# Patient Record
Sex: Male | Born: 1950 | Race: Black or African American | Hispanic: No | Marital: Single | Smoking: Former smoker
Health system: Southern US, Community
[De-identification: ages and names within clinical notes are randomized; demographics above are authoritative.]

## PROBLEM LIST (undated history)

## (undated) DIAGNOSIS — I10 Essential (primary) hypertension: Secondary | ICD-10-CM

## (undated) DIAGNOSIS — I480 Paroxysmal atrial fibrillation: Secondary | ICD-10-CM

## (undated) DIAGNOSIS — G40909 Epilepsy, unspecified, not intractable, without status epilepticus: Secondary | ICD-10-CM

## (undated) DIAGNOSIS — N184 Chronic kidney disease, stage 4 (severe): Secondary | ICD-10-CM

## (undated) DIAGNOSIS — N186 End stage renal disease: Secondary | ICD-10-CM

## (undated) DIAGNOSIS — Z992 Dependence on renal dialysis: Secondary | ICD-10-CM

---

## 2006-02-27 ENCOUNTER — Emergency Department: Payer: Self-pay | Admitting: Unknown Physician Specialty

## 2006-08-11 ENCOUNTER — Emergency Department: Payer: Self-pay | Admitting: Emergency Medicine

## 2011-05-12 ENCOUNTER — Other Ambulatory Visit: Payer: Self-pay | Admitting: Internal Medicine

## 2011-08-07 ENCOUNTER — Emergency Department: Payer: Self-pay

## 2011-11-17 ENCOUNTER — Emergency Department: Payer: Self-pay | Admitting: Emergency Medicine

## 2021-03-10 ENCOUNTER — Emergency Department (HOSPITAL_COMMUNITY): Payer: Self-pay

## 2021-03-10 ENCOUNTER — Inpatient Hospital Stay (HOSPITAL_COMMUNITY)
Admission: EM | Admit: 2021-03-10 | Discharge: 2021-03-12 | DRG: 305 | Disposition: A | Discharge status: Home / Self Care | Payer: Self-pay | Attending: Internal Medicine | Admitting: Internal Medicine

## 2021-03-10 DIAGNOSIS — Z87891 Personal history of nicotine dependence: Secondary | ICD-10-CM

## 2021-03-10 DIAGNOSIS — Z66 Do not resuscitate: Secondary | ICD-10-CM | POA: Diagnosis present

## 2021-03-10 DIAGNOSIS — N400 Enlarged prostate without lower urinary tract symptoms: Secondary | ICD-10-CM | POA: Diagnosis present

## 2021-03-10 DIAGNOSIS — D631 Anemia in chronic kidney disease: Secondary | ICD-10-CM | POA: Diagnosis present

## 2021-03-10 DIAGNOSIS — E785 Hyperlipidemia, unspecified: Secondary | ICD-10-CM | POA: Diagnosis present

## 2021-03-10 DIAGNOSIS — R9431 Abnormal electrocardiogram [ECG] [EKG]: Secondary | ICD-10-CM

## 2021-03-10 DIAGNOSIS — N189 Chronic kidney disease, unspecified: Secondary | ICD-10-CM

## 2021-03-10 DIAGNOSIS — I10 Essential (primary) hypertension: Secondary | ICD-10-CM | POA: Diagnosis present

## 2021-03-10 DIAGNOSIS — I714 Abdominal aortic aneurysm, without rupture, unspecified: Secondary | ICD-10-CM

## 2021-03-10 DIAGNOSIS — E860 Dehydration: Secondary | ICD-10-CM | POA: Diagnosis present

## 2021-03-10 DIAGNOSIS — N179 Acute kidney failure, unspecified: Secondary | ICD-10-CM | POA: Diagnosis present

## 2021-03-10 DIAGNOSIS — N281 Cyst of kidney, acquired: Secondary | ICD-10-CM | POA: Diagnosis present

## 2021-03-10 DIAGNOSIS — N184 Chronic kidney disease, stage 4 (severe): Secondary | ICD-10-CM | POA: Diagnosis present

## 2021-03-10 DIAGNOSIS — Z79899 Other long term (current) drug therapy: Secondary | ICD-10-CM

## 2021-03-10 DIAGNOSIS — I723 Aneurysm of iliac artery: Secondary | ICD-10-CM

## 2021-03-10 DIAGNOSIS — Z7982 Long term (current) use of aspirin: Secondary | ICD-10-CM

## 2021-03-10 DIAGNOSIS — I129 Hypertensive chronic kidney disease with stage 1 through stage 4 chronic kidney disease, or unspecified chronic kidney disease: Secondary | ICD-10-CM | POA: Diagnosis present

## 2021-03-10 DIAGNOSIS — I160 Hypertensive urgency: Principal | ICD-10-CM

## 2021-03-10 DIAGNOSIS — Z20822 Contact with and (suspected) exposure to covid-19: Secondary | ICD-10-CM | POA: Diagnosis present

## 2021-03-10 DIAGNOSIS — Z8249 Family history of ischemic heart disease and other diseases of the circulatory system: Secondary | ICD-10-CM

## 2021-03-10 DIAGNOSIS — R7989 Other specified abnormal findings of blood chemistry: Secondary | ICD-10-CM

## 2021-03-10 DIAGNOSIS — R55 Syncope and collapse: Principal | ICD-10-CM

## 2021-03-10 HISTORY — DX: Chronic kidney disease, stage 4 (severe): N18.4

## 2021-03-10 HISTORY — DX: Essential (primary) hypertension: I10

## 2021-03-10 LAB — COMPREHENSIVE METABOLIC PANEL
ALT: 16 U/L (ref 0–44)
AST: 22 U/L (ref 15–41)
Albumin: 3.9 g/dL (ref 3.5–5.0)
Alkaline Phosphatase: 91 U/L (ref 38–126)
Anion gap: 10 (ref 5–15)
BUN: 47 mg/dL — ABNORMAL HIGH (ref 8–23)
CO2: 24 mmol/L (ref 22–32)
Calcium: 10 mg/dL (ref 8.9–10.3)
Chloride: 102 mmol/L (ref 98–111)
Creatinine, Ser: 5.47 mg/dL — ABNORMAL HIGH (ref 0.61–1.24)
GFR, Estimated: 11 mL/min — ABNORMAL LOW (ref >60)
Glucose, Bld: 140 mg/dL — ABNORMAL HIGH (ref 70–99)
Potassium: 4.9 mmol/L (ref 3.5–5.1)
Sodium: 136 mmol/L (ref 135–145)
Total Bilirubin: 0.6 mg/dL (ref 0.3–1.2)
Total Protein: 7.6 g/dL (ref 6.5–8.1)

## 2021-03-10 LAB — CBC WITH DIFFERENTIAL/PLATELET
Abs Immature Granulocytes: 0.02 10*3/uL (ref 0.00–0.07)
Basophils Absolute: 0 10*3/uL (ref 0.0–0.1)
Basophils Relative: 1 %
Eosinophils Absolute: 0 10*3/uL (ref 0.0–0.5)
Eosinophils Relative: 1 %
HCT: 35.6 % — ABNORMAL LOW (ref 39.0–52.0)
Hemoglobin: 11.3 g/dL — ABNORMAL LOW (ref 13.0–17.0)
Immature Granulocytes: 0 %
Lymphocytes Relative: 25 %
Lymphs Abs: 1.6 10*3/uL (ref 0.7–4.0)
MCH: 28.4 pg (ref 26.0–34.0)
MCHC: 31.7 g/dL (ref 30.0–36.0)
MCV: 89.4 fL (ref 80.0–100.0)
Monocytes Absolute: 0.4 10*3/uL (ref 0.1–1.0)
Monocytes Relative: 6 %
Neutro Abs: 4.3 10*3/uL (ref 1.7–7.7)
Neutrophils Relative %: 67 %
Platelets: 298 10*3/uL (ref 150–400)
RBC: 3.98 MIL/uL — ABNORMAL LOW (ref 4.22–5.81)
RDW: 15.3 % (ref 11.5–15.5)
WBC: 6.4 10*3/uL (ref 4.0–10.5)
nRBC: 0 % (ref 0.0–0.2)

## 2021-03-10 LAB — ETHANOL: Alcohol, Ethyl (B): <10 mg/dL (ref <10)

## 2021-03-10 NOTE — ED Triage Notes (Signed)
Pt arrives via EMS from home with reports of near syncopal event. Pt was smoking weed and drinking today. EKG unremarkable for EMS. CBG 192. Denies CP or SOB.

## 2021-03-10 NOTE — ED Provider Notes (Signed)
Emergency Medicine Provider Triage Evaluation Note  Tanner Rollins , a 70 y.o. male  was evaluated in triage.  Pt complains of syncopal episode that occurred prior to arrival.  Patient states he was at a friend's house and was drinking wine and smoking marijuana.  While sitting in a chair he began feeling lightheaded and briefly lost consciousness.  His friend lowered him to the floor.  Denies any falls or head trauma.  States that he currently feels fatigued but otherwise is having no complaints.  Denies any chest pain or shortness of breath before or after the syncopal episode.  No current chest pain or shortness of breath.  No abdominal pain.  No numbness or weakness.  Physical Exam  BP (!) 158/105 (BP Location: Right Arm)   Pulse 61   Temp (!) 97.5 F (36.4 C) (Oral)   Resp 14   SpO2 98%  Gen:   Awake, no distress   Resp:  Normal effort  MSK:   Moves extremities without difficulty  Other:  Strength is 5/5 in all 4 extremities. RRR without M/R/G.  No tongue bites.  Medical Decision Making  Medically screening exam initiated at 4:52 PM.  Appropriate orders placed.  Brandy Milum was informed that the remainder of the evaluation will be completed by another provider, this initial triage assessment does not replace that evaluation, and the importance of remaining in the ED until their evaluation is complete.   Rayna Sexton, PA-C 03/10/21 1655    Luna Fuse, MD 03/11/21 1109

## 2021-03-11 ENCOUNTER — Encounter (HOSPITAL_COMMUNITY): Payer: Self-pay | Admitting: Internal Medicine

## 2021-03-11 ENCOUNTER — Other Ambulatory Visit: Payer: Self-pay

## 2021-03-11 ENCOUNTER — Emergency Department (HOSPITAL_COMMUNITY): Payer: Self-pay

## 2021-03-11 DIAGNOSIS — I10 Essential (primary) hypertension: Secondary | ICD-10-CM | POA: Diagnosis present

## 2021-03-11 LAB — RESP PANEL BY RT-PCR (FLU A&B, COVID) ARPGX2
Influenza A by PCR: NEGATIVE
Influenza B by PCR: NEGATIVE
SARS Coronavirus 2 by RT PCR: NEGATIVE

## 2021-03-11 LAB — LIPID PANEL
Cholesterol: 191 mg/dL (ref 0–200)
HDL: 45 mg/dL (ref >40)
LDL Cholesterol: 125 mg/dL — ABNORMAL HIGH (ref 0–99)
Total CHOL/HDL Ratio: 4.2 RATIO
Triglycerides: 103 mg/dL (ref <150)
VLDL: 21 mg/dL (ref 0–40)

## 2021-03-11 LAB — URINALYSIS, ROUTINE W REFLEX MICROSCOPIC
Bacteria, UA: NONE SEEN
Bilirubin Urine: NEGATIVE
Glucose, UA: 50 mg/dL — AB
Hgb urine dipstick: NEGATIVE
Ketones, ur: NEGATIVE mg/dL
Leukocytes,Ua: NEGATIVE
Nitrite: NEGATIVE
Protein, ur: >=300 mg/dL — AB
Specific Gravity, Urine: 1.011 (ref 1.005–1.030)
pH: 6 (ref 5.0–8.0)

## 2021-03-11 LAB — IRON AND TIBC
Iron: 69 ug/dL (ref 45–182)
Saturation Ratios: 25 % (ref 17.9–39.5)
TIBC: 272 ug/dL (ref 250–450)
UIBC: 203 ug/dL

## 2021-03-11 LAB — FERRITIN: Ferritin: 304 ng/mL (ref 24–336)

## 2021-03-11 LAB — TSH: TSH: 1.777 u[IU]/mL (ref 0.350–4.500)

## 2021-03-11 LAB — SODIUM, URINE, RANDOM: Sodium, Ur: 116 mmol/L

## 2021-03-11 MED ORDER — CARVEDILOL 3.125 MG PO TABS: 6.2500 mg | ORAL_TABLET | Freq: Two times a day (BID) | ORAL | Status: DC

## 2021-03-11 MED ORDER — ONDANSETRON HCL 4 MG PO TABS: 4.0000 mg | ORAL_TABLET | Freq: Four times a day (QID) | ORAL | Status: DC | PRN

## 2021-03-11 MED ORDER — ASPIRIN EC 81 MG PO TBEC
81.0000 mg | DELAYED_RELEASE_TABLET | Freq: Every day | ORAL | Status: DC
  Administered 2021-03-11 – 2021-03-12 (×2): 81 mg via ORAL
  Filled 2021-03-11 (×2): qty 1

## 2021-03-11 MED ORDER — ACETAMINOPHEN 325 MG PO TABS: 650.0000 mg | ORAL_TABLET | Freq: Four times a day (QID) | ORAL | Status: DC | PRN

## 2021-03-11 MED ORDER — SODIUM CHLORIDE 0.9 % IV BOLUS
1000.0000 mL | Freq: Once | INTRAVENOUS | Status: AC
  Administered 2021-03-11: 1000 mL via INTRAVENOUS

## 2021-03-11 MED ORDER — CARVEDILOL 6.25 MG PO TABS
6.2500 mg | ORAL_TABLET | Freq: Two times a day (BID) | ORAL | Status: DC
  Administered 2021-03-11 – 2021-03-12 (×4): 6.25 mg via ORAL
  Filled 2021-03-11 (×2): qty 2
  Filled 2021-03-11 (×2): qty 1

## 2021-03-11 MED ORDER — SODIUM CHLORIDE 0.9 % IV SOLN: INTRAVENOUS | Status: AC

## 2021-03-11 MED ORDER — ONDANSETRON HCL 4 MG/2ML IJ SOLN: 4.0000 mg | Freq: Four times a day (QID) | INTRAMUSCULAR | Status: DC | PRN

## 2021-03-11 MED ORDER — AMLODIPINE BESYLATE 10 MG PO TABS
10.0000 mg | ORAL_TABLET | Freq: Every day | ORAL | Status: DC
  Administered 2021-03-11 – 2021-03-12 (×2): 10 mg via ORAL
  Filled 2021-03-11: qty 1
  Filled 2021-03-11: qty 2
  Filled 2021-03-11: qty 1

## 2021-03-11 MED ORDER — LACTATED RINGERS IV SOLN: INTRAVENOUS | Status: DC

## 2021-03-11 MED ORDER — ACETAMINOPHEN 650 MG RE SUPP: 650.0000 mg | Freq: Four times a day (QID) | RECTAL | Status: DC | PRN

## 2021-03-11 MED ORDER — HEPARIN SODIUM (PORCINE) 5000 UNIT/ML IJ SOLN
5000.0000 [IU] | Freq: Three times a day (TID) | INTRAMUSCULAR | Status: DC
  Administered 2021-03-11 – 2021-03-12 (×4): 5000 [IU] via SUBCUTANEOUS
  Filled 2021-03-11 (×4): qty 1

## 2021-03-11 MED ORDER — SENNOSIDES-DOCUSATE SODIUM 8.6-50 MG PO TABS: 1.0000 | ORAL_TABLET | Freq: Every evening | ORAL | Status: DC | PRN

## 2021-03-11 MED ORDER — LABETALOL HCL 5 MG/ML IV SOLN: 5.0000 mg | INTRAVENOUS | Status: DC | PRN

## 2021-03-11 NOTE — ED Provider Notes (Signed)
[facility] EMERGENCY DEPARTMENT Provider Note   CSN: [id number] Arrival date & time: 03/10/21  1647     History No chief complaint on file.   Tanner Rollins is a 70 y.o. male.  Patient with history of hypertension, followed at [facility], presents to the emergency department for syncopal episode.  Patient states that he was with his girlfriend helping her pack when he was smoking a joint.  He states that he had a few second prodrome of lightheadedness, turned his head and then fell to the ground.  He is not certain as to whether he blacked out completely but states that he could not get up for about 30 minutes.  He has never had anything like this happen to him before.  He denies associated chest pain or shortness of breath.  No headache.  No recent nausea, vomiting, or diarrhea.  Patient states that he is on a blood pressure medication that is 10 mg.  He states that he had not gone to the [place] for several years but then recently returned.  He states that he went because he felt his blood pressure was less controlled.  He also reports having significant weight loss, unable to quantify, since about Christmas.  He had lab work drawn and stated that they told him he needed to follow-up with a kidney doctor.  He does not yet have an appointment.  No medication adjustments that he relates.  Patient denies signs of stroke including: facial droop, slurred speech, aphasia, weakness/numbness in extremities, imbalance/trouble walking.       No past medical history on file.  There are no problems to display for this patient.   The histories are not reviewed yet. Please review them in the "History" navigator section and refresh this [place].     No family history on file.     Home Medications Prior to Admission medications   Not on File    Allergies    Patient has no allergy information on record.  Review of Systems   Review of Systems  Constitutional:  Positive for  unexpected weight change. Negative for fever.  HENT:  Negative for rhinorrhea and sore throat.   Eyes:  Negative for redness.  Respiratory:  Negative for cough and shortness of breath.   Cardiovascular:  Negative for chest pain.  Gastrointestinal:  Negative for abdominal pain, diarrhea, nausea and vomiting.  Genitourinary:  Negative for dysuria and hematuria.  Musculoskeletal:  Negative for myalgias.  Skin:  Negative for rash.  Neurological:  Positive for syncope. Negative for headaches.   Physical Exam Updated Vital Signs BP (!) 155/99   Pulse 88   Temp 98.2 F (36.8 C)   Resp 16   SpO2 98%   Physical Exam Vitals and nursing note reviewed.  Constitutional:      General: He is not in acute distress.    Appearance: He is well-developed.  HENT:     Head: Normocephalic and atraumatic.     Right Ear: External ear normal.     Left Ear: External ear normal.  Eyes:     General:        Right eye: No discharge.        Left eye: No discharge.     Conjunctiva/sclera: Conjunctivae normal.  Cardiovascular:     Rate and Rhythm: Normal rate and regular rhythm.     Heart sounds: Normal heart sounds.  Pulmonary:     Effort: Pulmonary effort is normal.  Breath sounds: Normal breath sounds.  Abdominal:     Palpations: Abdomen is soft.     Tenderness: There is no abdominal tenderness. There is no guarding or rebound.  Musculoskeletal:     Cervical back: Normal range of motion and neck supple.     Right lower leg: No edema.     Left lower leg: No edema.  Skin:    General: Skin is warm and dry.  Neurological:     Mental Status: He is alert.    ED Results / Procedures / Treatments   Labs (all labs ordered are listed, but only abnormal results are displayed) Labs Reviewed  COMPREHENSIVE METABOLIC PANEL - Abnormal; Notable for the following components:      Result Value   Glucose, Bld 140 (*)    BUN 47 (*)    Creatinine, Ser 5.47 (*)    GFR, Estimated 11 (*)    All other  components within normal limits  CBC WITH DIFFERENTIAL/PLATELET - Abnormal; Notable for the following components:   RBC 3.98 (*)    Hemoglobin 11.3 (*)    HCT 35.6 (*)    All other components within normal limits  URINALYSIS, ROUTINE W REFLEX MICROSCOPIC - Abnormal; Notable for the following components:   Glucose, UA 50 (*)    Protein, ur >=300 (*)    All other components within normal limits  ETHANOL    ED ECG REPORT   Date: 03/11/2021  Rate: 60  Rhythm: normal sinus rhythm  QRS Axis: normal  Intervals: normal  ST/T Wave abnormalities: nonspecific ST/T changes  Conduction Disutrbances:none  Narrative Interpretation:   Old EKG Reviewed: none available  I have personally reviewed the EKG tracing and agree with the computerized printout as noted.    Radiology CT Head Wo Contrast  Result Date: 03/10/2021 CLINICAL DATA:  70 year old male with syncope. EXAM: CT HEAD WITHOUT CONTRAST TECHNIQUE: Contiguous axial images were obtained from the base of the skull through the vertex without intravenous contrast. COMPARISON:  None. FINDINGS: Brain: There is mild age-related atrophy and chronic microvascular ischemic changes. 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 There is no acute intracranial hemorrhage. No mass effect midline shift. No extra-axial fluid collection. Vascular: No hyperdense vessel or unexpected calcification. Skull: Normal. Negative for fracture or focal lesion. Sinuses/Orbits: No acute finding. Other: None IMPRESSION: 1. No acute intracranial pathology. 2. Mild age-related atrophy and chronic microvascular ischemic changes. 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 Electronically Signed   By: Anner Crete M.D.   On: 03/10/2021 17:37    Procedures Procedures   Medications Ordered in ED Medications - No data to display  ED Course  I have reviewed the triage vital signs and  the nursing notes.  Pertinent labs & imaging results that were available during my care of the patient were reviewed by me and considered in my medical decision making (see chart for details).  Patient seen and examined after extended wait time.   Will likely need admit. Will obtain CT scan due to weight loss, AKI.   Will advise admission for AKI vs CKD, syncope with abnormal EKG, syncope with abnormal CT. Discussed with Dr. Laverta Baltimore.  Vital signs reviewed and are as follows: BP (!) 155/99   Pulse 88   Temp 98.2 F (36.8 C)   Resp 16   SpO2 98%   CT with chronic findings as above.   Reviewed  results with patient at bedside.  Given multiple problems, he agrees to admission for further evaluation.  Discussed with internal medicine teaching service will evaluate for admission.     MDM Rules/Calculators/A&P                           Admit.    Final Clinical Impression(s) / ED Diagnoses Final diagnoses:  Syncope, unspecified syncope type  Elevated serum creatinine  Abnormal electrocardiogram (ECG) (EKG)  Abdominal aortic aneurysm (AAA) without rupture (HCC)  Iliac artery aneurysm [facility])  Hypertensive urgency    Rx / DC Orders ED Discharge Orders     None        Carlisle Cater, PA-C 03/11/21 [id number]    Margette Fast, MD 03/13/21 [phone]

## 2021-03-11 NOTE — H&P (Signed)
Date: 03/11/2021               Patient Name:  Tanner Rollins MRN: [id number]  DOB: 07/17/1951 Age / Sex: 70 y.o., male   PCP: Pcp, No         Medical Service: Internal Medicine Teaching Service         Attending Physician: Dr. Lucious Groves, DO    First Contact: Dr. Linwood Dibbles, MD Pager: [phone]  Second Contact: Dr. Harvie Heck Pager: [phone]       After Hours (After 5p/  First Contact Pager: [phone]  weekends / holidays): Second Contact Pager: [phone]   Chief Complaint: Pre-syncope  History of Present Illness: Mr. Dupler is a 70 year old gentleman with a history of HTN, CKD stage IV, and HLD who follows with the [facility] presenting via EMS after a pre-syncope episode. Patient reports he was helping his girlfriend move yesterday afternoon when he felt lightheaded and dropped to the floor. His girlfriend was able to catch him before he was fallen. He did not hit his head and did not have any prodromal symptoms. He denies blacking out but states he felt weak and was not able to get up for about 30 minutes before EMS arrival. He reports smoking marijuana and drinking some wine prior to the episode. States he believes he felt dizzy because he had not had anything to eat or drink that day.  He denies any chest pain, shortness of breath, blurry vision, headaches, abdominal pain, nausea/vomiting, fever/chills, palpitations, LE edema, orthopnea. He reports loss of appetite, decreased energy and weight loss since December 2021 but states his appetite is coming back slowly.  Of note, patient has been seen multiple providers since 2016 and his creatinine was 1.88. He was seen at the ED of the [facility] on 7/14 for severe BP with SBP in the 200s to 220s. He was found to have a creatinine of 4.4, GFR 14, UA showed proteinuria. Patient was started on amlodipine 10 mg daily. He was given information to establish with new PCP and nephrologist.  He has been taking his amlodipine but has yet  to establish with a PCP.  Meds:  Current Meds  Medication Sig   amLODipine (NORVASC) 10 MG tablet Take 1 tablet by mouth daily.   aspirin 325 MG tablet Take 325 mg by mouth daily.     Allergies: Allergies as of 03/10/2021   (Not on File)   Past Medical History:  Diagnosis Date   CKD (chronic kidney disease) stage 4, GFR 15-29 ml/min (HCC)    Hypertension     Family History: History of hypertension in mother, father, brothers and sister.history of kidney disease in mother. History of arterial aneurysm in sister.  Social History: Served in the [employer] for 7 years. Currently lives in [place]. Has not worked since last year. Quit smoking tobacco 25 years ago after smoking half a pack for 20 years. Occasional alcohol use. Smokes occasional marijuana.  Review of Systems: A complete ROS was negative except as per HPI.   Physical Exam: Blood pressure (!) 192/120, pulse 73, temperature 98.2 F (36.8 C), resp. rate 15, SpO2 100 %.  General: Pleasant, cachectic appearing elderly man laying in bed. No acute distress. HEENT: Dry mucous membrane.  Head: Normocephalic. Atraumatic. CV: RRR. No murmurs, rubs, or gallops. No LE edema Pulmonary: Lungs CTAB. Normal effort. No wheezing or rales. Abdominal: Soft, nontender, nondistended. Normal bowel sounds. Extremities: Palpable pulses. Normal ROM. Skin: Warm and  dry. Hypopigmented areas on back Neuro: A&Ox3. Moves all extremities. Normal sensation. No focal deficit. Psych: Normal mood and affect  EKG: personally reviewed my interpretation is normal sinus rhythm rate of 60, LVH and possible LAE.  CT head: No acute intracranial abnormalities. Evidence of old-appearing infarct in the right basal ganglia and right parietal cortex.   CT abdomen pelvis: No acute findings. A 5.4 cm right renal cyst. Heterogenous mineralization in the sacrum and iliac bone possible paget disease.    Assessment & Plan by Problem: Active Problems:   Severe  hypertension  Mr. Amaya is a 70 year old gentleman with a history of HTN, CKD stage IV, and HLD presented after a pre-syncope episode and found to have severe hypertension and AKI on CKD4.   #Severe hypertension Patient with a history of uncontrolled hypertension who did not come in contact with the medical system for 6 years. Recent evaluation at [facility] ED on 7/14 for elevated BP with SBP in the 200s to 230s. Presented with SBP in the 150s to 200s. Continue to deny any headaches, blurry vision, chest pain, shortness of breath. No signs of end-organ damage. Patient hemodynamically stable with HR 60s to 90s. We will add another antihypertensive and slowly decrease BP --Resume home amlodipine 10 mg --Start Coreg 6.25 mg twice daily --Daily vital --PRN labetalol for SBP > 200  #Presyncope Patient reports feeling lightheaded while moving some boxes yesterday afternoon. Felt weak and fell down but did not pass out. Reports smoking weed and drinking wine but had no liquid or food prior to episode. CT head with no acute abnormalities. EKG unremarkable. Ethanol wnl. Labs significant for AKI on CKD likely secondary to dehydration. --S/p 1 L IVNS bolus --IVNS @ 100 mL/hr x5 hr  #AKI on CKD 4 Patient follows with the [facility]. Creatinine in 2016 was 1.88. Patient had not seen a provider since 2016. He was recently seen in the [facility] ED as above and found to have a creatinine elevation to 4.4, GFR 14.  Found to have BUN/creatinine ratio of 47/5.47 on arrival. Likely secondary to progression of his renal disease versus AKI on CKD in the setting of decreased p.o. intake. UA positive for proteinuria. -- MA/CR to quantify proteinuria -- A1c to check for diabetes -- Follow-up urine sodium -- Strict I&O's, daily weights -- Trend BMP, electrolytes -- Avoid nephrotoxic agents -- Renal diet  #Normocytic anemia Patient found to have a hemoglobin of 11.3 on admission. Reported loss of appetite, decreased  energy and weight loss the last few months. No history of colonoscopy.  Likely 2/2 anemia of chronic disease in the setting of CKD but will rule out iron deficiency anemia. -- Follow-up iron studies -- Daily CBC -- Needs GI referral for colonoscopy in the outpatient  #Hyperlipidemia [facility].  -- Continue home ASA 81 mg daily -- Follow-up lipid panel  CODE STATUS: DNR DIET: Renal  PPx: Heparin  Dispo: Admit patient to Observation with expected length of stay less than 2 midnights.  Signed: Lacinda Axon, MD 03/11/2021, 9:40 AM  Pager: [phone] Internal Medicine Teaching Service After 5pm on weekdays and 1pm on weekends: On Call pager: [phone]

## 2021-03-12 ENCOUNTER — Inpatient Hospital Stay (HOSPITAL_COMMUNITY): Payer: Self-pay

## 2021-03-12 DIAGNOSIS — N184 Chronic kidney disease, stage 4 (severe): Secondary | ICD-10-CM | POA: Diagnosis present

## 2021-03-12 DIAGNOSIS — D631 Anemia in chronic kidney disease: Secondary | ICD-10-CM | POA: Diagnosis present

## 2021-03-12 DIAGNOSIS — I10 Essential (primary) hypertension: Secondary | ICD-10-CM

## 2021-03-12 DIAGNOSIS — N400 Enlarged prostate without lower urinary tract symptoms: Secondary | ICD-10-CM | POA: Diagnosis present

## 2021-03-12 DIAGNOSIS — N189 Chronic kidney disease, unspecified: Secondary | ICD-10-CM | POA: Diagnosis present

## 2021-03-12 LAB — RENAL FUNCTION PANEL
Albumin: 2.9 g/dL — ABNORMAL LOW (ref 3.5–5.0)
Anion gap: 7 (ref 5–15)
BUN: 36 mg/dL — ABNORMAL HIGH (ref 8–23)
CO2: 25 mmol/L (ref 22–32)
Calcium: 9.5 mg/dL (ref 8.9–10.3)
Chloride: 106 mmol/L (ref 98–111)
Creatinine, Ser: 4.75 mg/dL — ABNORMAL HIGH (ref 0.61–1.24)
GFR, Estimated: 13 mL/min — ABNORMAL LOW (ref >60)
Glucose, Bld: 91 mg/dL (ref 70–99)
Phosphorus: 3.8 mg/dL (ref 2.5–4.6)
Potassium: 4.6 mmol/L (ref 3.5–5.1)
Sodium: 138 mmol/L (ref 135–145)

## 2021-03-12 LAB — CBC
HCT: 31.3 % — ABNORMAL LOW (ref 39.0–52.0)
Hemoglobin: 10.1 g/dL — ABNORMAL LOW (ref 13.0–17.0)
MCH: 28.6 pg (ref 26.0–34.0)
MCHC: 32.3 g/dL (ref 30.0–36.0)
MCV: 88.7 fL (ref 80.0–100.0)
Platelets: 267 10*3/uL (ref 150–400)
RBC: 3.53 MIL/uL — ABNORMAL LOW (ref 4.22–5.81)
RDW: 15.6 % — ABNORMAL HIGH (ref 11.5–15.5)
WBC: 5.4 10*3/uL (ref 4.0–10.5)
nRBC: 0 % (ref 0.0–0.2)

## 2021-03-12 LAB — HEMOGLOBIN A1C
Hgb A1c MFr Bld: 5.5 % (ref 4.8–5.6)
Mean Plasma Glucose: 111 mg/dL

## 2021-03-12 LAB — HIV ANTIBODY (ROUTINE TESTING W REFLEX): HIV Screen 4th Generation wRfx: NONREACTIVE

## 2021-03-12 MED ORDER — ASPIRIN 81 MG PO TBEC: 81.0000 mg | DELAYED_RELEASE_TABLET | Freq: Every day | ORAL | 11 refills | Status: DC

## 2021-03-12 MED ORDER — ATORVASTATIN CALCIUM 40 MG PO TABS
40.0000 mg | ORAL_TABLET | Freq: Every day | ORAL | Status: DC
  Administered 2021-03-12: 40 mg via ORAL
  Filled 2021-03-12: qty 1

## 2021-03-12 MED ORDER — CHLORTHALIDONE 25 MG PO TABS: 12.5000 mg | ORAL_TABLET | Freq: Every day | ORAL | 0 refills | Status: DC

## 2021-03-12 MED ORDER — ATORVASTATIN CALCIUM 40 MG PO TABS: 40.0000 mg | ORAL_TABLET | Freq: Every day | ORAL | 0 refills | Status: DC

## 2021-03-12 NOTE — Progress Notes (Signed)
Patient has ordered for discharge. Given discharge instructions with paper to the patient. Iv removed. Given all belongings to the patient. 

## 2021-03-12 NOTE — Progress Notes (Deleted)
HD#1 SUBJECTIVE:  Patient Summary: Tanner Rollins is a 70 y.o. with a pertinent PMH of CKD IV, HTN, and HLD, who presented with presyncope and admitted for severe hypertension.   Overnight Events: No acute events overnight reported.  Interim History: Tanner Rollins was seen and evaluated at the bedside this morning. He states that he feels much improved compared to yesterday. Patient does not feel dizzy or lightheaded anymore. He continues to deny any cp, sob, blurry vision, headaches, or any other sxs at this time.   OBJECTIVE:  Vital Signs: Vitals:   03/12/21 0020 03/12/21 0412 03/12/21 0732 03/12/21 1222  BP: (!) 155/91 (!) 156/91  (!) 156/84  Pulse: (!) 54 (!) 51 61 (!) 55  Resp: '17 18  16  '$ Temp: 98.3 F (36.8 C) 98.2 F (36.8 C)  98.5 F (36.9 C)  TempSrc: Oral Oral  Oral  SpO2: 100% 100%  99%  Weight:  64.1 kg    Height:       Supplemental O2: Room Air SpO2: 99 %  Filed Weights   03/11/21 1150 03/12/21 0412  Weight: 68 kg 64.1 kg     Intake/Output Summary (Last 24 hours) at 03/12/2021 1412 Last data filed at 03/12/2021 0418 Gross per 24 hour  Intake 1368.86 ml  Output --  Net 1368.86 ml   Net IO Since Admission: 1,868.86 mL [03/12/21 1412]  Physical Exam: Physical Exam Constitutional:      General: He is not in acute distress.    Appearance: Normal appearance. He is not ill-appearing.  HENT:     Head: Normocephalic and atraumatic.  Eyes:     Pupils: Pupils are equal, round, and reactive to light.  Cardiovascular:     Rate and Rhythm: Normal rate and regular rhythm.     Pulses: Normal pulses.     Heart sounds: Normal heart sounds. No murmur heard. Pulmonary:     Effort: Pulmonary effort is normal. No respiratory distress.     Breath sounds: Normal breath sounds. No wheezing, rhonchi or rales.  Abdominal:     General: Bowel sounds are normal. There is no distension.     Palpations: Abdomen is soft.     Tenderness: There is no abdominal  tenderness.  Musculoskeletal:        General: Normal range of motion.     Right lower leg: No edema.     Left lower leg: No edema.  Skin:    General: Skin is warm and dry.  Neurological:     General: No focal deficit present.     Mental Status: He is alert and oriented to person, place, and time. Mental status is at baseline.    Patient Lines/Drains/Airways Status     Active Line/Drains/Airways     Name Placement date Placement time Site Days   Peripheral IV 03/11/21 20 G Anterior;Left Forearm 03/11/21  0700  Forearm  1            Pertinent Labs: CBC Latest Ref Rng & Units 03/12/2021 03/10/2021  WBC 4.0 - 10.5 K/uL 5.4 6.4  Hemoglobin 13.0 - 17.0 g/dL 10.1(L) 11.3(L)  Hematocrit 39.0 - 52.0 % 31.3(L) 35.6(L)  Platelets 150 - 400 K/uL 267 298    CMP Latest Ref Rng & Units 03/12/2021 03/10/2021  Glucose 70 - 99 mg/dL 91 140(H)  BUN 8 - 23 mg/dL 36(H) 47(H)  Creatinine 0.61 - 1.24 mg/dL 4.75(H) 5.47(H)  Sodium 135 - 145 mmol/L 138 136  Potassium 3.5 - 5.1  mmol/L 4.6 4.9  Chloride 98 - 111 mmol/L 106 102  CO2 22 - 32 mmol/L 25 24  Calcium 8.9 - 10.3 mg/dL 9.5 10.0  Total Protein 6.5 - 8.1 g/dL - 7.6  Total Bilirubin 0.3 - 1.2 mg/dL - 0.6  Alkaline Phos 38 - 126 U/L - 91  AST 15 - 41 U/L - 22  ALT 0 - 44 U/L - 16    No results for input(s): GLUCAP in the last 72 hours.   Pertinent Imaging: US RENAL  Result Date: 03/12/2021 CLINICAL DATA:  Chronic kidney disease EXAM: RENAL / URINARY TRACT ULTRASOUND COMPLETE COMPARISON:  03/11/2021 FINDINGS: Right Kidney: Renal measurements: 8.8 x 4.8 x 4.0 cm = volume: 88 mL. Increased echogenicity compatible with medical renal disease. Right upper pole renal cyst measures 3.3 x 3.4 x 2.8 cm. No renal obstruction or hydronephrosis. Left Kidney: Renal measurements: 7.8 x 4.5 x 4.7 cm = volume: 75 mL. Increased echogenicity compatible with medical renal disease. No hydronephrosis. Limited visualization of the lower pole because of  obscuring bowel gas. Bladder: Appears normal for degree of bladder distention. Other: Enlarged prostate proximally measuring 4.7 x 3.4 x 4.7 cm IMPRESSION: Increased renal echogenicity bilaterally compatible with medical renal disease. 3.4 cm right upper pole renal cyst. No acute finding or hydronephrosis Prostate enlargement Electronically Signed   By: Jerilynn Mages.  Shick M.D.   On: 03/12/2021 12:16    ASSESSMENT/PLAN:  Assessment: Active Problems:   Severe hypertension  Plan:  #Severe hypertension Patient with a history of uncontrolled hypertension who did not come in contact with the medical system for 6 years. He recently established at the [facility] ED on 7/14 for elevated BP with SBP in the 200s to 230s. On admission, SBP in the 150s to 200s. Continue to deny any headaches, blurry vision, chest pain, shortness of breath. No signs of end-organ damage. HR in 50-60s. We will consider to add another antihypertensive and slowly decrease BP. PRN labetalol for SBP >200, however, patient has not required any.  --Continue home amlodipine 10 mg --Started on Coreg 6.25 mg BID, will continue --Daily vitals --PRN labetalol for SBP >200  #Presyncope Patient reports feeling lightheaded while moving some boxes yesterday afternoon. Felt weak and fell down but did not lose consciousness. Reports smoking weed and drinking wine but had no water or food prior to episode. CT head with no acute abnormalities. EKG unremarkable. Ethanol wnl. Labs significant for AKI on CKD likely secondary to dehydration. Patient does not feel lightheaded/dizzy anymore. --S/p 1 L IVNS bolus and IVNS 100 mL/hr x5 hr --Continue to monitor    #AKI on CKD 4 Patient follows with the [facility]. Creatinine in 2016 was 1.88. Patient had not seen a provider since 2016. He was recently seen in the [facility] ED as above and found to have a creatinine elevation to 4.4, GFR 14.  Found to have BUN/creatinine ratio of 47/5.47 on arrival. Likely secondary to  progression of his renal disease versus AKI on CKD in the setting of decreased p.o. intake. UA positive for proteinuria and urine sodium of 116. Cr improved to 4.75 today, closer to baseline. HbA1c 5.5. Renal U/S significant for increased renal echogenicity bilaterally comparable with medical renal disease and a 3.4cm R upper pole renal cyst, no hydronephrosis. -- Strict I&O's, daily weights -- Trend BMP, electrolytes -- Avoid nephrotoxic agents -- Renal diet  #Normocytic anemia Patient found to have a hemoglobin of 11.3 on admission, down to 10.1 today. Reported loss of  appetite, decreased energy and weight loss the last few months. No history of colonoscopy.  Likely 2/2 anemia of chronic disease in the setting of CKD. Iron, TIBC, and ferritin all within normal limits.  -- Daily CBC -- Needs GI referral for colonoscopy in the outpatient  #Hyperlipidemia [facility]. LDL of 125 on admission. The 10-year ASCVD risk score Mikey Bussing DC Brooke Bonito., et al., 2013) is: 39.7%   Values used to calculate the score:     Age: 31 years     Sex: Male     Is Non-Hispanic African American: Yes     Diabetic: No     Tobacco smoker: Yes     Systolic Blood Pressure: [id number] mmHg     Is BP treated: Yes     HDL Cholesterol: 45 mg/dL     Total Cholesterol: 191 mg/dL -- Continue home ASA 81 mg daily -- Start atorvastatin '40mg'$    CODE STATUS: DNR DIET: Renal  PPx: Heparin  Dispo: Admit patient to Observation with expected length of stay less than 2 midnights.  Signature: Buddy Duty, D.O.  Internal Medicine Resident, PGY-1 [facility]  Pager: [phone] 2:12 PM, 03/12/2021   Please contact the on call pager after 5 pm and on weekends at [phone].

## 2021-03-12 NOTE — Discharge Summary (Addendum)
Name: Tanner Rollins MRN: [id number] DOB: 15-Jan-1951 70 y.o. PCP: Pcp, No  Date of Admission: 03/10/2021  4:47 PM Date of Discharge: 03/12/2021 Attending Physician: Lucious Groves, DO  Discharge Diagnosis: 1. Severe Hypertension 2. Presyncope 3. AKI on CKD 4 4. Normocytic anemia 5. Hyperlipidemia 6. Prostate enlargement 7. Renal Cyst  Discharge Medications: Allergies as of 03/12/2021   No Known Allergies      Medication List     STOP taking these medications    aspirin 325 MG tablet Replaced by: aspirin 81 MG EC tablet       TAKE these medications    amLODipine 10 MG tablet Commonly known as: NORVASC Take 1 tablet by mouth daily.   aspirin 81 MG EC tablet Take 1 tablet (81 mg total) by mouth daily. Swallow whole. Start taking on: March 13, 2021 Replaces: aspirin 325 MG tablet   atorvastatin 40 MG tablet Commonly known as: LIPITOR Take 1 tablet (40 mg total) by mouth daily.   chlorthalidone 25 MG tablet Commonly known as: HYGROTON Take 0.5 tablets (12.5 mg total) by mouth daily.        Disposition and follow-up:   Tanner Rollins was discharged from [facility] in Good condition.  At the hospital follow up visit please address:    #Severe hypertension --Continue home amlodipine 10 mg --Started on chlorthalidone 12.'5mg'$  daily on discharge       #AKI on CKD 4 Recheck BMP at follow up with addition of chlorthlidone.    #Hyperlipidemia Reported history on recent [place] notes. LDL of 125 on admission. The 10-year ASCVD risk score Mikey Bussing DC Jr., et al., 2013) is: 39.7% -- Started on atorvastatin '40mg'$     2.  Labs / imaging needed at time of follow-up: Renal function panel, CBC  3.  Pending labs/ test needing follow-up: None  Follow-up Appointments:  [facility] Follow up in 1 week(s).   Specialty: General Practice Why: needs GI referral for colonoscopy Contact information: [place] [address] [address] Hospital Course by problem list: 1. Tanner Rollins is a 70 year old gentleman with a history of HTN, CKD stage IV, and HLD who follows with the [facility] presenting after a pre-syncope episode and found to have severe hypertension and AKI on CKD4.   Severe Hypertension: Patient with a history of uncontrolled hypertension who did not come in contact with the medical system for 6 years. Recent evaluation at the [facility] ED on 7/14 for elevated BP with SBP in the 200s to 230s. Patient was started on amlodipine '10mg'$  at this time. On presentation, SBP in the 150s to 200s with no sxs. Continued to deny ha, visual changes, cp, sob. Patient was started on Coreg 6.'25mg'$  BID on 7/24 and discontinued prior to discharge due to bradycardia. SBP continues to be in the 150s with HR in the 50s. Patient started on chlorthalidone 12.'5mg'$  daily for BP control.   Presyncope: Patient presented after feeling lightheaded while moving boxes on 7/23. He dropped to the floor but did not hit his head nor did he lose consciousness. Possibly related to lack of food or drink that day. Patient given fluid bolus in the ED and sxs improved.   AKI on CKD IV: Patient follows with the [facility]. Creatinine in 2016 was 1.88. Patient had not seen a provider since 2016. He was  recently seen in the [facility] ED as above and found to have a creatinine elevation to 4.4, GFR 14.  Found to have BUN/creatinine ratio of 47/5.47 on arrival. UA positive for proteinuria and urine sodium of 116. Cr improved to 4.75 today, closer to baseline. HbA1c 5.5. Renal U/S significant for increased renal echogenicity bilaterally comparable with medical renal disease and a 3.4cm R upper pole renal cyst, no hydronephrosis. Cr improved to 4.75 on 7/25, but still above baseline. Patient states he has an appt with the [facility] nephrologist in a week.   Normocytic anemia: Patient found to have a hemoglobin of 11.3 on  admission, down to 10.1 on 7/25. Reported loss of appetite, decreased energy and weight loss the last few months. No history of colonoscopy.  Likely 2/2 anemia of chronic disease in the setting of CKD as iron studies are all within normal limits.   Hyperlipidemia: LDL of 125 on admission. 10-year ASCVD risk score calculated at 39.7%. Patient started on high intensity atorvastatin '40mg'$  in the hospital and was continued on home ASA '81mg'$  daily. Will continue on both of those medications upon discharge.   Enlarged Prostate: Noted on U/S, no signs or symptoms of obstruction  3.4 cm Right Renal Cyst -noted on U/S  Discharge Exam:   BP (!) 156/84 (BP Location: Left Arm)   Pulse (!) 55   Temp 98.5 F (36.9 C) (Oral)   Resp 16   Ht 5' 11.5" (1.816 m)   Wt 64.1 kg   SpO2 99%   BMI 19.45 kg/m  Discharge exam:  Constitutional:      General: He is not in acute distress.    Appearance: Normal appearance. He is not ill-appearing. HENT:    Head: Normocephalic and atraumatic. Eyes:    Pupils: Pupils are equal, round, and reactive to light. Cardiovascular:    Rate and Rhythm: Normal rate and regular rhythm.    Pulses: Normal pulses.    Heart sounds: Normal heart sounds. No murmur heard. Pulmonary:    Effort: Pulmonary effort is normal. No respiratory distress.    Breath sounds: Normal breath sounds. No wheezing, rhonchi or rales. Abdominal:    General: Bowel sounds are normal. There is no distension.    Palpations: Abdomen is soft.    Tenderness: There is no abdominal tenderness. Musculoskeletal:        General: Normal range of motion.    Right lower leg: No edema.    Left lower leg: No edema. Skin:    General: Skin is warm and dry. Neurological:    General: No focal deficit present.    Mental Status: He is alert and oriented to person, place, and time. Mental status is at baseline.   Pertinent Labs, Studies, and Procedures:  Renal ultrasound: Increased renal echogenicity bilaterally  compatible with medical renal disease.  3.4 cm right upper pole renal cyst. No acute finding or hydronephrosis Prostate enlargement  CT abd/pelvis: 1. No acute findings in the abdomen or pelvis. Specifically, no findings to explain the patient's history of weight loss. 2. 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 Recommend follow-up ultrasound every 3 years. This recommendation follows ACR consensus guidelines: White Paper of the ACR Incidental Findings Committee II on Vascular Findings. J Am Coll Radiol 2013; 10:789-794. 3. Markedly heterogeneous mineralization in the sacrum and upper  iliac bones with fusion of the SI joints. Similar heterogeneous mineralization and coarsened trabecula in the L4 vertebral body. Imaging features suggests Paget's disease. 4. 3.4 cm right renal cyst. 5. Aortic Atherosclerosis (ICD10-I70.0).  CT head: 1. No acute intracranial pathology. 2. Mild age-related atrophy and chronic microvascular ischemic changes. 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      Discharge Instructions: Dear Mr. Leeroy, Ruedas were admitted to the hospital for lightheadedness and treated for your severe high blood pressure. Please continue to take your '10mg'$  of amlodipine daily as well as the new blood pressure medication we are starting you on, chlorthalidone 12.'5mg'$ . This medicine will be at the [facility] for you. There is also a medicine for you at the pharmacy called atorvastatin. This medicine is to help reduce your cholesterol to prevent heart attacks or strokes. Please follow up with your primary care physician and nephrologist at the [facility] to continue to monitor your blood pressure and kidney function. Your primary care physician should also  refer you to a gastroenterologist so you can have a colonoscopy done in the near future. I am glad you are feeling better!  - Dr. Raymondo Band   Signed: Dorethea Clan, DO 03/12/2021, 3:18 PM   Pager: [phone]

## 2021-03-12 NOTE — Hospital Course (Addendum)
Tanner Rollins is a 70 year old gentleman with a history of HTN, CKD stage IV, and HLD who follows with the [facility] presenting after a pre-syncope episode and found to have severe hypertension and AKI on CKD4.   Severe Hypertension: Patient with a history of uncontrolled hypertension who did not come in contact with the medical system for 6 years. Recent evaluation at the [facility] ED on 7/14 for elevated BP with SBP in the 200s to 230s. Patient was started on amlodipine '10mg'$  at this time. On presentation, SBP in the 150s to 200s with no sxs. Continued to deny ha, visual changes, cp, sob. Patient was started on Coreg 6.'25mg'$  BID on 7/24. SBP continues to be in the 150s with HR in the 50s.    Presyncope: Patient presented after feeling lightheaded while moving boxes on 7/23. He dropped to the floor but did not hit his head nor did he lose consciousness. Possibly related to lack of food or drink that day. Patient given fluid bolus in the ED and sxs improved.   AKI on CKD IV: Patient follows with the [facility]. Creatinine in 2016 was 1.88. Patient had not seen a provider since 2016. He was recently seen in the [facility] ED as above and found to have a creatinine elevation to 4.4, GFR 14.  Found to have BUN/creatinine ratio of 47/5.47 on arrival. UA positive for proteinuria and urine sodium of 116. Cr improved to 4.75 today, closer to baseline. HbA1c 5.5. Renal U/S significant for increased renal echogenicity bilaterally comparable with medical renal disease and a 3.4cm R upper pole renal cyst, no hydronephrosis. Cr improved to 4.75 on 7/25, but still above baseline.  Normocytic anemia: Patient found to have a hemoglobin of 11.3 on admission, down to 10.1 on 7/25. Reported loss of appetite, decreased energy and weight loss the last few months. No history of colonoscopy.  Likely 2/2 anemia of chronic disease in the setting of CKD as iron studies are all within normal limits. Will refer to GI upon discharge for  colonoscopy.   Hyperlipidemia: LDL of 125 on admission. 10-year ASCVD risk score calculated at 39.7%. Patient started on high intensity atorvastatin '40mg'$  in the hospital and was continued on home ASA '81mg'$  daily.

## 2021-03-12 NOTE — Discharge Instructions (Addendum)
Dear Tanner Rollins,  You were admitted to the hospital for lightheadedness and treated for your severe high blood pressure. Please continue to take your '10mg'$  of amlodipine daily as well as the new blood pressure medication we are starting you on, chlorthalidone. This medicine will be at the [facility] for you. There is also a medicine for you at the pharmacy called atorvastatin. This medicine is to help reduce your cholesterol to prevent heart attacks or strokes. Please also follow up with your primary care physician and nephrologist at the [facility] to continue to monitor your blood pressure and kidney function. Your primary care physician should also refer you to a gastroenterologist so you can have a colonoscopy done in the near future. I am glad you are feeling better!  - Dr. Raymondo Band

## 2022-01-23 ENCOUNTER — Other Ambulatory Visit: Payer: Self-pay

## 2022-01-23 ENCOUNTER — Encounter (HOSPITAL_COMMUNITY): Payer: Self-pay

## 2022-01-23 ENCOUNTER — Emergency Department (HOSPITAL_COMMUNITY): Payer: Medicare Other

## 2022-01-23 ENCOUNTER — Inpatient Hospital Stay (HOSPITAL_COMMUNITY): Payer: Medicare Other

## 2022-01-23 ENCOUNTER — Inpatient Hospital Stay (HOSPITAL_COMMUNITY)
Admission: EM | Admit: 2022-01-23 | Discharge: 2022-01-31 | DRG: 252 | Disposition: A | Discharge status: Skilled Nursing Facility | Payer: Medicare Other | Attending: Internal Medicine | Admitting: Internal Medicine

## 2022-01-23 DIAGNOSIS — E871 Hypo-osmolality and hyponatremia: Secondary | ICD-10-CM | POA: Diagnosis present

## 2022-01-23 DIAGNOSIS — T461X6A Underdosing of calcium-channel blockers, initial encounter: Secondary | ICD-10-CM | POA: Diagnosis present

## 2022-01-23 DIAGNOSIS — E872 Acidosis, unspecified: Secondary | ICD-10-CM | POA: Diagnosis present

## 2022-01-23 DIAGNOSIS — N179 Acute kidney failure, unspecified: Secondary | ICD-10-CM

## 2022-01-23 DIAGNOSIS — E861 Hypovolemia: Secondary | ICD-10-CM | POA: Diagnosis present

## 2022-01-23 DIAGNOSIS — M898X9 Other specified disorders of bone, unspecified site: Secondary | ICD-10-CM | POA: Diagnosis present

## 2022-01-23 DIAGNOSIS — I5032 Chronic diastolic (congestive) heart failure: Secondary | ICD-10-CM | POA: Diagnosis present

## 2022-01-23 DIAGNOSIS — T466X6A Underdosing of antihyperlipidemic and antiarteriosclerotic drugs, initial encounter: Secondary | ICD-10-CM | POA: Diagnosis present

## 2022-01-23 DIAGNOSIS — E8729 Other acidosis: Secondary | ICD-10-CM

## 2022-01-23 DIAGNOSIS — Z79899 Other long term (current) drug therapy: Secondary | ICD-10-CM

## 2022-01-23 DIAGNOSIS — D631 Anemia in chronic kidney disease: Secondary | ICD-10-CM | POA: Diagnosis present

## 2022-01-23 DIAGNOSIS — I161 Hypertensive emergency: Secondary | ICD-10-CM | POA: Diagnosis present

## 2022-01-23 DIAGNOSIS — J690 Pneumonitis due to inhalation of food and vomit: Secondary | ICD-10-CM | POA: Diagnosis not present

## 2022-01-23 DIAGNOSIS — J9601 Acute respiratory failure with hypoxia: Secondary | ICD-10-CM | POA: Diagnosis present

## 2022-01-23 DIAGNOSIS — R54 Age-related physical debility: Secondary | ICD-10-CM | POA: Diagnosis present

## 2022-01-23 DIAGNOSIS — E1122 Type 2 diabetes mellitus with diabetic chronic kidney disease: Secondary | ICD-10-CM | POA: Diagnosis present

## 2022-01-23 DIAGNOSIS — N186 End stage renal disease: Secondary | ICD-10-CM | POA: Diagnosis present

## 2022-01-23 DIAGNOSIS — I132 Hypertensive heart and chronic kidney disease with heart failure and with stage 5 chronic kidney disease, or end stage renal disease: Secondary | ICD-10-CM | POA: Diagnosis present

## 2022-01-23 DIAGNOSIS — I248 Other forms of acute ischemic heart disease: Secondary | ICD-10-CM | POA: Diagnosis present

## 2022-01-23 DIAGNOSIS — E875 Hyperkalemia: Secondary | ICD-10-CM | POA: Diagnosis present

## 2022-01-23 DIAGNOSIS — Z91141 Patient's other noncompliance with medication regimen due to financial hardship: Secondary | ICD-10-CM

## 2022-01-23 DIAGNOSIS — Z992 Dependence on renal dialysis: Secondary | ICD-10-CM | POA: Diagnosis not present

## 2022-01-23 DIAGNOSIS — Z841 Family history of disorders of kidney and ureter: Secondary | ICD-10-CM

## 2022-01-23 DIAGNOSIS — I480 Paroxysmal atrial fibrillation: Secondary | ICD-10-CM | POA: Diagnosis not present

## 2022-01-23 DIAGNOSIS — F17210 Nicotine dependence, cigarettes, uncomplicated: Secondary | ICD-10-CM | POA: Diagnosis present

## 2022-01-23 DIAGNOSIS — I272 Pulmonary hypertension, unspecified: Secondary | ICD-10-CM | POA: Diagnosis present

## 2022-01-23 DIAGNOSIS — R432 Parageusia: Secondary | ICD-10-CM | POA: Diagnosis present

## 2022-01-23 DIAGNOSIS — E1165 Type 2 diabetes mellitus with hyperglycemia: Secondary | ICD-10-CM | POA: Diagnosis present

## 2022-01-23 DIAGNOSIS — R110 Nausea: Secondary | ICD-10-CM | POA: Diagnosis not present

## 2022-01-23 DIAGNOSIS — R748 Abnormal levels of other serum enzymes: Secondary | ICD-10-CM | POA: Diagnosis present

## 2022-01-23 DIAGNOSIS — R079 Chest pain, unspecified: Secondary | ICD-10-CM

## 2022-01-23 DIAGNOSIS — T502X6A Underdosing of carbonic-anhydrase inhibitors, benzothiadiazides and other diuretics, initial encounter: Secondary | ICD-10-CM | POA: Diagnosis present

## 2022-01-23 LAB — I-STAT VENOUS BLOOD GAS, ED
Acid-base deficit: 15 mmol/L — ABNORMAL HIGH (ref 0.0–2.0)
Acid-base deficit: 15 mmol/L — ABNORMAL HIGH (ref 0.0–2.0)
Bicarbonate: 10 mmol/L — ABNORMAL LOW (ref 20.0–28.0)
Bicarbonate: 10.8 mmol/L — ABNORMAL LOW (ref 20.0–28.0)
Calcium, Ion: 1.1 mmol/L — ABNORMAL LOW (ref 1.15–1.40)
Calcium, Ion: 1.13 mmol/L — ABNORMAL LOW (ref 1.15–1.40)
HCT: 16 % — ABNORMAL LOW (ref 39.0–52.0)
HCT: 23 % — ABNORMAL LOW (ref 39.0–52.0)
Hemoglobin: 5.4 g/dL — CL (ref 13.0–17.0)
Hemoglobin: 7.8 g/dL — ABNORMAL LOW (ref 13.0–17.0)
O2 Saturation: 97 %
O2 Saturation: 72 %
Potassium: 4.9 mmol/L (ref 3.5–5.1)
Potassium: 4.9 mmol/L (ref 3.5–5.1)
Sodium: 136 mmol/L (ref 135–145)
Sodium: 136 mmol/L (ref 135–145)
TCO2: 11 mmol/L — ABNORMAL LOW (ref 22–32)
TCO2: 12 mmol/L — ABNORMAL LOW (ref 22–32)
pCO2, Ven: 21.2 mmHg — ABNORMAL LOW (ref 44–60)
pCO2, Ven: 26.5 mmHg — ABNORMAL LOW (ref 44–60)
pH, Ven: 7.279 (ref 7.25–7.43)
pH, Ven: 7.219 — ABNORMAL LOW (ref 7.25–7.43)
pO2, Ven: 94 mmHg — ABNORMAL HIGH (ref 32–45)
pO2, Ven: 44 mmHg (ref 32–45)

## 2022-01-23 LAB — CBC WITH DIFFERENTIAL/PLATELET
Abs Immature Granulocytes: 0.05 10*3/uL (ref 0.00–0.07)
Basophils Absolute: 0 10*3/uL (ref 0.0–0.1)
Basophils Relative: 0 %
Eosinophils Absolute: 0 10*3/uL (ref 0.0–0.5)
Eosinophils Relative: 0 %
HCT: 24.2 % — ABNORMAL LOW (ref 39.0–52.0)
Hemoglobin: 8 g/dL — ABNORMAL LOW (ref 13.0–17.0)
Immature Granulocytes: 1 %
Lymphocytes Relative: 11 %
Lymphs Abs: 1.1 10*3/uL (ref 0.7–4.0)
MCH: 28.2 pg (ref 26.0–34.0)
MCHC: 33.1 g/dL (ref 30.0–36.0)
MCV: 85.2 fL (ref 80.0–100.0)
Monocytes Absolute: 0.8 10*3/uL (ref 0.1–1.0)
Monocytes Relative: 8 %
Neutro Abs: 8 10*3/uL — ABNORMAL HIGH (ref 1.7–7.7)
Neutrophils Relative %: 80 %
Platelets: 132 10*3/uL — ABNORMAL LOW (ref 150–400)
RBC: 2.84 MIL/uL — ABNORMAL LOW (ref 4.22–5.81)
RDW: 17.2 % — ABNORMAL HIGH (ref 11.5–15.5)
WBC: 10 10*3/uL (ref 4.0–10.5)
nRBC: 0 % (ref 0.0–0.2)

## 2022-01-23 LAB — BASIC METABOLIC PANEL
Anion gap: 19 — ABNORMAL HIGH (ref 5–15)
BUN: 149 mg/dL — ABNORMAL HIGH (ref 8–23)
CO2: 15 mmol/L — ABNORMAL LOW (ref 22–32)
Calcium: 8.3 mg/dL — ABNORMAL LOW (ref 8.9–10.3)
Chloride: 106 mmol/L (ref 98–111)
Creatinine, Ser: 24.77 mg/dL — ABNORMAL HIGH (ref 0.61–1.24)
GFR, Estimated: 2 mL/min — ABNORMAL LOW (ref >60)
Glucose, Bld: 160 mg/dL — ABNORMAL HIGH (ref 70–99)
Potassium: 6.4 mmol/L (ref 3.5–5.1)
Sodium: 140 mmol/L (ref 135–145)

## 2022-01-23 LAB — CBC
HCT: 19.4 % — ABNORMAL LOW (ref 39.0–52.0)
Hemoglobin: 6.4 g/dL — CL (ref 13.0–17.0)
MCH: 27.4 pg (ref 26.0–34.0)
MCHC: 33 g/dL (ref 30.0–36.0)
MCV: 82.9 fL (ref 80.0–100.0)
Platelets: 122 10*3/uL — ABNORMAL LOW (ref 150–400)
RBC: 2.34 MIL/uL — ABNORMAL LOW (ref 4.22–5.81)
RDW: 17.3 % — ABNORMAL HIGH (ref 11.5–15.5)
WBC: 12.6 10*3/uL — ABNORMAL HIGH (ref 4.0–10.5)
nRBC: 0 % (ref 0.0–0.2)

## 2022-01-23 LAB — RENAL FUNCTION PANEL
Albumin: 2.9 g/dL — ABNORMAL LOW (ref 3.5–5.0)
Anion gap: 21 — ABNORMAL HIGH (ref 5–15)
BUN: 150 mg/dL — ABNORMAL HIGH (ref 8–23)
CO2: 8 mmol/L — ABNORMAL LOW (ref 22–32)
Calcium: 8.9 mg/dL (ref 8.9–10.3)
Chloride: 108 mmol/L (ref 98–111)
Creatinine, Ser: 27.02 mg/dL — ABNORMAL HIGH (ref 0.61–1.24)
GFR, Estimated: 2 mL/min — ABNORMAL LOW (ref >60)
Glucose, Bld: 128 mg/dL — ABNORMAL HIGH (ref 70–99)
Phosphorus: 9 mg/dL — ABNORMAL HIGH (ref 2.5–4.6)
Potassium: 5.7 mmol/L — ABNORMAL HIGH (ref 3.5–5.1)
Sodium: 137 mmol/L (ref 135–145)

## 2022-01-23 LAB — COMPREHENSIVE METABOLIC PANEL
ALT: 14 U/L (ref 0–44)
AST: 24 U/L (ref 15–41)
Albumin: 3 g/dL — ABNORMAL LOW (ref 3.5–5.0)
Alkaline Phosphatase: 81 U/L (ref 38–126)
Anion gap: 20 — ABNORMAL HIGH (ref 5–15)
BUN: 140 mg/dL — ABNORMAL HIGH (ref 8–23)
CO2: 9 mmol/L — ABNORMAL LOW (ref 22–32)
Calcium: 8.9 mg/dL (ref 8.9–10.3)
Chloride: 108 mmol/L (ref 98–111)
Creatinine, Ser: 26.11 mg/dL — ABNORMAL HIGH (ref 0.61–1.24)
GFR, Estimated: 2 mL/min — ABNORMAL LOW (ref >60)
Glucose, Bld: 135 mg/dL — ABNORMAL HIGH (ref 70–99)
Potassium: 5 mmol/L (ref 3.5–5.1)
Sodium: 137 mmol/L (ref 135–145)
Total Bilirubin: 0.9 mg/dL (ref 0.3–1.2)
Total Protein: 6.2 g/dL — ABNORMAL LOW (ref 6.5–8.1)

## 2022-01-23 LAB — LACTIC ACID, PLASMA
Lactic Acid, Venous: 1.8 mmol/L (ref 0.5–1.9)
Lactic Acid, Venous: 2 mmol/L (ref 0.5–1.9)

## 2022-01-23 LAB — BLOOD GAS, VENOUS
Acid-base deficit: 14.3 mmol/L — ABNORMAL HIGH (ref 0.0–2.0)
Bicarbonate: 10.1 mmol/L — ABNORMAL LOW (ref 20.0–28.0)
Drawn by: 6775
O2 Saturation: 66.6 %
Patient temperature: 36.2
pCO2, Ven: 20 mmHg — ABNORMAL LOW (ref 44–60)
pH, Ven: 7.3 (ref 7.25–7.43)
pO2, Ven: 38 mmHg (ref 32–45)

## 2022-01-23 LAB — HEPATITIS B SURFACE ANTIGEN: Hepatitis B Surface Ag: NONREACTIVE

## 2022-01-23 LAB — PROCALCITONIN: Procalcitonin: 0.96 ng/mL

## 2022-01-23 LAB — LIPASE, BLOOD: Lipase: 210 U/L — ABNORMAL HIGH (ref 11–51)

## 2022-01-23 LAB — GLUCOSE, CAPILLARY: Glucose-Capillary: 115 mg/dL — ABNORMAL HIGH (ref 70–99)

## 2022-01-23 LAB — TROPONIN I (HIGH SENSITIVITY)
Troponin I (High Sensitivity): 323 ng/L (ref <18)
Troponin I (High Sensitivity): 304 ng/L (ref <18)

## 2022-01-23 LAB — ABO/RH: ABO/RH(D): A POS

## 2022-01-23 LAB — BRAIN NATRIURETIC PEPTIDE: B Natriuretic Peptide: 2684.7 pg/mL — ABNORMAL HIGH (ref 0.0–100.0)

## 2022-01-23 LAB — HEPATITIS B SURFACE ANTIBODY,QUALITATIVE: Hep B S Ab: NONREACTIVE

## 2022-01-23 LAB — HEPATITIS C ANTIBODY: HCV Ab: NONREACTIVE

## 2022-01-23 MED ORDER — FENTANYL CITRATE (PF) 100 MCG/2ML IJ SOLN: 50.0000 ug | INTRAMUSCULAR | Status: DC | PRN

## 2022-01-23 MED ORDER — NITROGLYCERIN 0.4 MG SL SUBL
0.4000 mg | SUBLINGUAL_TABLET | SUBLINGUAL | Status: DC | PRN
  Administered 2022-01-30 (×2): 0.4 mg via SUBLINGUAL
  Filled 2022-01-23: qty 1

## 2022-01-23 MED ORDER — SODIUM BICARBONATE 8.4 % IV SOLN
100.0000 meq | Freq: Once | INTRAVENOUS | Status: AC
  Administered 2022-01-23: 100 meq via INTRAVENOUS
  Filled 2022-01-23: qty 100

## 2022-01-23 MED ORDER — ANTICOAGULANT SODIUM CITRATE 4% (200MG/5ML) IV SOLN: 5.0000 mL | Status: DC | PRN

## 2022-01-23 MED ORDER — ALTEPLASE 2 MG IJ SOLR: 2.0000 mg | Freq: Once | INTRAMUSCULAR | Status: DC | PRN

## 2022-01-23 MED ORDER — SODIUM ZIRCONIUM CYCLOSILICATE 10 G PO PACK
10.0000 g | PACK | Freq: Once | ORAL | Status: AC
  Administered 2022-01-23: 10 g via ORAL
  Filled 2022-01-23: qty 1

## 2022-01-23 MED ORDER — ACETAMINOPHEN 325 MG PO TABS
650.0000 mg | ORAL_TABLET | Freq: Four times a day (QID) | ORAL | Status: DC | PRN
Start: 2022-01-23 — End: 2022-01-31
  Filled 2022-01-23 (×2): qty 2

## 2022-01-23 MED ORDER — DEXTROSE 50 % IV SOLN
1.0000 | Freq: Once | INTRAVENOUS | Status: AC
Start: 2022-01-23 — End: 2022-01-23
  Administered 2022-01-23: 50 mL via INTRAVENOUS
  Filled 2022-01-23: qty 50

## 2022-01-23 MED ORDER — SODIUM BICARBONATE 650 MG PO TABS
1300.0000 mg | ORAL_TABLET | Freq: Three times a day (TID) | ORAL | Status: DC
  Administered 2022-01-23 – 2022-01-28 (×14): 1300 mg via ORAL
  Filled 2022-01-23 (×14): qty 2

## 2022-01-23 MED ORDER — LIDOCAINE HCL (PF) 1 % IJ SOLN: 5.0000 mL | INTRAMUSCULAR | Status: DC | PRN

## 2022-01-23 MED ORDER — LACTATED RINGERS IV SOLN: INTRAVENOUS | Status: DC

## 2022-01-23 MED ORDER — CHLORHEXIDINE GLUCONATE CLOTH 2 % EX PADS
6.0000 | MEDICATED_PAD | Freq: Every day | CUTANEOUS | Status: DC
  Administered 2022-01-24: 6 via TOPICAL

## 2022-01-23 MED ORDER — FENTANYL CITRATE (PF) 100 MCG/2ML IJ SOLN
25.0000 ug | INTRAMUSCULAR | Status: DC | PRN
  Administered 2022-01-24 – 2022-01-25 (×2): 25 ug via INTRAVENOUS
  Filled 2022-01-23 (×2): qty 2

## 2022-01-23 MED ORDER — LIDOCAINE-PRILOCAINE 2.5-2.5 % EX CREA: 1.0000 "application " | TOPICAL_CREAM | CUTANEOUS | Status: DC | PRN

## 2022-01-23 MED ORDER — INSULIN ASPART 100 UNIT/ML IV SOLN
10.0000 [IU] | Freq: Once | INTRAVENOUS | Status: AC
  Administered 2022-01-23: 10 [IU] via INTRAVENOUS

## 2022-01-23 MED ORDER — NITROGLYCERIN IN D5W 200-5 MCG/ML-% IV SOLN
0.0000 ug/min | INTRAVENOUS | Status: DC
  Administered 2022-01-23: 5 ug/min via INTRAVENOUS
  Filled 2022-01-23: qty 250

## 2022-01-23 MED ORDER — FUROSEMIDE 10 MG/ML IJ SOLN
120.0000 mg | Freq: Four times a day (QID) | INTRAVENOUS | Status: DC
  Administered 2022-01-23 – 2022-01-24 (×2): 120 mg via INTRAVENOUS
  Filled 2022-01-23 (×5): qty 12

## 2022-01-23 MED ORDER — DOCUSATE SODIUM 100 MG PO CAPS: 100.0000 mg | ORAL_CAPSULE | Freq: Two times a day (BID) | ORAL | Status: DC | PRN

## 2022-01-23 MED ORDER — NICARDIPINE HCL IN NACL 20-0.86 MG/200ML-% IV SOLN
3.0000 mg/h | INTRAVENOUS | Status: DC
  Administered 2022-01-23: 10 mg/h via INTRAVENOUS
  Administered 2022-01-23: 7.5 mg/h via INTRAVENOUS
  Administered 2022-01-23: 5 mg/h via INTRAVENOUS
  Administered 2022-01-24 (×2): 10 mg/h via INTRAVENOUS
  Administered 2022-01-24: 7.5 mg/h via INTRAVENOUS
  Administered 2022-01-24 (×2): 10 mg/h via INTRAVENOUS
  Administered 2022-01-24 (×2): 7.5 mg/h via INTRAVENOUS
  Administered 2022-01-24: 12 mg/h via INTRAVENOUS
  Filled 2022-01-23 (×15): qty 200

## 2022-01-23 MED ORDER — LACTATED RINGERS IV BOLUS
1000.0000 mL | Freq: Once | INTRAVENOUS | Status: AC
Start: 2022-01-23 — End: 2022-01-23
  Administered 2022-01-23: 1000 mL via INTRAVENOUS

## 2022-01-23 MED ORDER — ASPIRIN 81 MG PO TBEC
81.0000 mg | DELAYED_RELEASE_TABLET | Freq: Every day | ORAL | Status: DC
  Administered 2022-01-23 – 2022-01-31 (×9): 81 mg via ORAL
  Filled 2022-01-23 (×9): qty 1

## 2022-01-23 MED ORDER — CHLORHEXIDINE GLUCONATE CLOTH 2 % EX PADS
6.0000 | MEDICATED_PAD | Freq: Every day | CUTANEOUS | Status: DC
  Administered 2022-01-23: 6 via TOPICAL

## 2022-01-23 MED ORDER — CALCIUM GLUCONATE-NACL 1-0.675 GM/50ML-% IV SOLN
1.0000 g | Freq: Once | INTRAVENOUS | Status: AC
Start: 2022-01-23 — End: 2022-01-23
  Administered 2022-01-23: 1000 mg via INTRAVENOUS
  Filled 2022-01-23: qty 50

## 2022-01-23 MED ORDER — POLYETHYLENE GLYCOL 3350 17 G PO PACK: 17.0000 g | PACK | Freq: Every day | ORAL | Status: DC | PRN

## 2022-01-23 MED ORDER — HEPARIN SODIUM (PORCINE) 1000 UNIT/ML DIALYSIS: 1000.0000 [IU] | INTRAMUSCULAR | Status: DC | PRN

## 2022-01-23 MED ORDER — ORAL CARE MOUTH RINSE
15.0000 mL | Freq: Two times a day (BID) | OROMUCOSAL | Status: DC
  Administered 2022-01-23 – 2022-01-31 (×12): 15 mL via OROMUCOSAL

## 2022-01-23 MED ORDER — SODIUM CHLORIDE 0.9% IV SOLUTION: Freq: Once | INTRAVENOUS | Status: DC

## 2022-01-23 MED ORDER — PENTAFLUOROPROP-TETRAFLUOROETH EX AERO: 1.0000 "application " | INHALATION_SPRAY | CUTANEOUS | Status: DC | PRN

## 2022-01-23 MED ORDER — FENTANYL CITRATE PF 50 MCG/ML IJ SOSY
50.0000 ug | PREFILLED_SYRINGE | Freq: Once | INTRAMUSCULAR | Status: AC
  Administered 2022-01-23: 50 ug via INTRAVENOUS
  Filled 2022-01-23: qty 1

## 2022-01-23 NOTE — ED Triage Notes (Signed)
Pt with mild substernal chest pain  with radiation to back of next x 2 weeks, worsening in severity today. DOE since yesterday and nausea/upset stomach after eating x 3 days. 280/110 with EMS. No neurological symtpoms, no headache. 1 nitro given by EMS without any relief in pain but BP dropped to 167/88. Hx hypertension but has been off of medication x 3 months for financial reasons.

## 2022-01-23 NOTE — ED Notes (Signed)
Pt chem 8 had stairs so im going to redraw lab  I don't think EG7 is right. EDMD is aware.

## 2022-01-23 NOTE — Consult Note (Signed)
[facility]  HISTORY AND PHYSICAL  Tanner Rollins is an 71 y.o. male.    Chief Complaint: chest pain  HPI: Pt is a 76M with a PMH sig for HTN and CKD IV who is now seen in consultation at the request of Dr Loanne Drilling for evaluation and recommendations surrounding progressively worsening CKD.    Pt presented to ED today for 2 weeks of worsening substernal chest pain.  Became constant yesterday and is worsened with deep breathing.  Was found to be 89% on RA in triage and was placed on O2.  Evaluation showed BP 228/ 130, EKG with sinus rhythm and no concerning ST or T wave changes, BUN 140, Cr 26, K 5.0, CO2 9, Ca 8.9, Albumin 3.0, Hgb 8.0, trop 323--> 304 and CXR with small L pleural effusion and RLL possible consolidation.  Also has some prominent vascular markings.  Placed on a cardene gtt and transferred to ICU for further management.  Requiring 6L O2.  In this setting we are asked to see.  Pt's creatinine was 4.75 in July of 2022.  He first heard that he had kidney disease during that hospitalization.  He has never seen a nephrologist.  His mother and brother were both on dialysis.  He has not seen his PCP or any other physicians since the hospitalization July of last year.  He says that he has become nauseated over the past few weeks, even not able to tolerate water.  He does have dysgeusia and food aversions.     PMH: Past Medical History:  Diagnosis Date   CKD (chronic kidney disease) stage 4, GFR 15-29 ml/min (HCC)    Hypertension    PSH: History reviewed. No pertinent surgical history.   Past Medical History:  Diagnosis Date   CKD (chronic kidney disease) stage 4, GFR 15-29 ml/min (HCC)    Hypertension     Medications:  Prior to Admission:  Medications Prior to Admission  Medication Sig Dispense Refill Last Dose   amLODipine (NORVASC) 10 MG tablet Take 1 tablet by mouth daily.      aspirin EC 81 MG EC tablet Take 1 tablet (81 mg total) by mouth daily. Swallow  whole. 30 tablet 11    atorvastatin (LIPITOR) 40 MG tablet Take 1 tablet (40 mg total) by mouth daily. 30 tablet 0    chlorthalidone (HYGROTON) 25 MG tablet Take 0.5 tablets (12.5 mg total) by mouth daily. 15 tablet 0     Medications Prior to Admission  Medication Sig Dispense Refill   amLODipine (NORVASC) 10 MG tablet Take 1 tablet by mouth daily.     aspirin EC 81 MG EC tablet Take 1 tablet (81 mg total) by mouth daily. Swallow whole. 30 tablet 11   atorvastatin (LIPITOR) 40 MG tablet Take 1 tablet (40 mg total) by mouth daily. 30 tablet 0   chlorthalidone (HYGROTON) 25 MG tablet Take 0.5 tablets (12.5 mg total) by mouth daily. 15 tablet 0    ALLERGIES:  No Known Allergies  FAM HX: History reviewed. No pertinent family history.  Social History:   reports that he has been smoking cigarettes. He does not have any smokeless tobacco history on file. No history on file for alcohol use and drug use.  ROS: ROS: all other systems reviewed and are negative except as per HPI  Blood pressure (!) 150/79, pulse 87, temperature (!) 97.2 F (36.2 C), temperature source Oral, resp. rate (!) 23, height 5' 11.5" (1.816 m), weight 70.1 kg, SpO2  92 %. PHYSICAL EXAM: Physical Exam GEN appears uncomfortable, lying flat in bed HEENT some mild periorbital edema, anicteric NECK + JVD to angle of mandible (lying flat) PULM coarse breath sounds bilaterally with some basilar crackles CV RRR, loud S2 ABD soft EXT 1+ LE edema NEURO AAO x 3, some asterixis SKIN dry    Results for orders placed or performed during the hospital encounter of 01/23/22 (from the past 48 hour(s))  Troponin I (High Sensitivity)     Status: Abnormal   Collection Time: 01/23/22  3:15 PM  Result Value Ref Range   Troponin I (High Sensitivity) 323 (HH) <18 ng/L    Comment: CRITICAL RESULT CALLED TO, READ BACK BY AND VERIFIED WITH: S CHILINTON RN [id number] 01/23/2022 BY R VERAAR (NOTE) Elevated high sensitivity troponin I (hsTnI)  values and significant  changes across serial measurements may suggest ACS but many other  chronic and acute conditions are known to elevate hsTnI results.  Refer to the Links section for chest pain algorithms and additional  guidance. Performed at [facility], [place] [address] [place], [place] [address]   CBC with Differential     Status: Abnormal   Collection Time: 01/23/22  3:15 PM  Result Value Ref Range   WBC 10.0 4.0 - 10.5 K/uL   RBC 2.84 (L) 4.22 - 5.81 MIL/uL   Hemoglobin 8.0 (L) 13.0 - 17.0 g/dL   HCT 24.2 (L) 39.0 - 52.0 %   MCV 85.2 80.0 - 100.0 fL   MCH 28.2 26.0 - 34.0 pg   MCHC 33.1 30.0 - 36.0 g/dL   RDW 17.2 (H) 11.5 - 15.5 %   Platelets 132 (L) 150 - 400 K/uL    Comment: REPEATED TO VERIFY   nRBC 0.0 0.0 - 0.2 %   Neutrophils Relative % 80 %   Neutro Abs 8.0 (H) 1.7 - 7.7 K/uL   Lymphocytes Relative 11 %   Lymphs Abs 1.1 0.7 - 4.0 K/uL   Monocytes Relative 8 %   Monocytes Absolute 0.8 0.1 - 1.0 K/uL   Eosinophils Relative 0 %   Eosinophils Absolute 0.0 0.0 - 0.5 K/uL   Basophils Relative 0 %   Basophils Absolute 0.0 0.0 - 0.1 K/uL   Immature Granulocytes 1 %   Abs Immature Granulocytes 0.05 0.00 - 0.07 K/uL    Comment: Performed at [facility], [place] [address] [place], [place] [address]  Comprehensive metabolic panel     Status: Abnormal   Collection Time: 01/23/22  3:15 PM  Result Value Ref Range   Sodium 137 135 - 145 mmol/L   Potassium 5.0 3.5 - 5.1 mmol/L   Chloride 108 98 - 111 mmol/L   CO2 9 (L) 22 - 32 mmol/L   Glucose, Bld 135 (H) 70 - 99 mg/dL    Comment: Glucose reference range applies only to samples taken after fasting for at least 8 hours.   BUN 140 (H) 8 - 23 mg/dL   Creatinine, Ser 26.11 (H) 0.61 - 1.24 mg/dL    Comment: RESULTS CONFIRMED BY MANUAL DILUTION   Calcium 8.9 8.9 - 10.3 mg/dL   Total Protein 6.2 (L) 6.5 - 8.1 g/dL   Albumin 3.0 (L) 3.5 - 5.0 g/dL   AST 24 15 - 41 U/L   ALT 14 0 - 44 U/L   Alkaline  Phosphatase 81 38 - 126 U/L   Total Bilirubin 0.9 0.3 - 1.2 mg/dL   GFR, Estimated 2 (L) >60 mL/min  Comment: (NOTE) Calculated using the CKD-EPI Creatinine Equation (2021)    Anion gap 20 (H) 5 - 15    Comment: Performed at [facility], [place] [address] [place], [place] [address]  Lipase, blood     Status: Abnormal   Collection Time: 01/23/22  3:15 PM  Result Value Ref Range   Lipase 210 (H) 11 - 51 U/L    Comment: Performed at [facility], [place] [address], [place], [place] [address]  Brain natriuretic peptide     Status: Abnormal   Collection Time: 01/23/22  3:15 PM  Result Value Ref Range   B Natriuretic Peptide 2,684.7 (H) 0.0 - 100.0 pg/mL    Comment: Performed at [place] [address] [place], [place] [address]  I-Stat venous blood gas, ED     Status: Abnormal   Collection Time: 01/23/22  3:27 PM  Result Value Ref Range   pH, Ven 7.279 7.25 - 7.43   pCO2, Ven 21.2 (L) 44 - 60 mmHg   pO2, Ven 94 (H) 32 - 45 mmHg   Bicarbonate 10.0 (L) 20.0 - 28.0 mmol/L   TCO2 11 (L) 22 - 32 mmol/L   O2 Saturation 97 %   Acid-base deficit 15.0 (H) 0.0 - 2.0 mmol/L   Sodium 136 135 - 145 mmol/L   Potassium 4.9 3.5 - 5.1 mmol/L   Calcium, Ion 1.10 (L) 1.15 - 1.40 mmol/L   HCT 16.0 (L) 39.0 - 52.0 %   Hemoglobin 5.4 (LL) 13.0 - 17.0 g/dL    Comment: QUESTIONABLE RESULTS - CHARGE CREDITED Performed at [facility] [address], [place], [place] [address]    Sample type VENOUS    Comment NOTIFIED PHYSICIAN   I-Stat venous blood gas, ED     Status: Abnormal   Collection Time: 01/23/22  3:48 PM  Result Value Ref Range   pH, Ven 7.219 (L) 7.25 - 7.43   pCO2, Ven 26.5 (L) 44 - 60 mmHg   pO2, Ven 44 32 - 45 mmHg   Bicarbonate 10.8 (L) 20.0 - 28.0 mmol/L   TCO2 12 (L) 22 - 32 mmol/L   O2 Saturation 72 %   Acid-base deficit 15.0 (H) 0.0 - 2.0 mmol/L   Sodium 136 135 - 145 mmol/L   Potassium 4.9 3.5 - 5.1 mmol/L   Calcium, Ion 1.13 (L) 1.15 - 1.40 mmol/L    HCT 23.0 (L) 39.0 - 52.0 %   Hemoglobin 7.8 (L) 13.0 - 17.0 g/dL   Sample type VENOUS   Lactic acid, plasma     Status: None   Collection Time: 01/23/22  4:49 PM  Result Value Ref Range   Lactic Acid, Venous 1.8 0.5 - 1.9 mmol/L    Comment: Performed at [facility], [place] [address] [place], [place] [address]  Troponin I (High Sensitivity)     Status: Abnormal   Collection Time: 01/23/22  4:50 PM  Result Value Ref Range   Troponin I (High Sensitivity) 304 (HH) <18 ng/L    Comment: CRITICAL VALUE NOTED.  VALUE IS CONSISTENT WITH PREVIOUSLY REPORTED AND CALLED VALUE. (NOTE) Elevated high sensitivity troponin I (hsTnI) values and significant  changes across serial measurements may suggest ACS but many other  chronic and acute conditions are known to elevate hsTnI results.  Refer to the Links section for chest pain algorithms and additional  guidance. Performed at [facility], [place] [address] [place], [place] [address]  DG Chest Portable 1 View  Result Date: 01/23/2022 CLINICAL DATA:  Chest pain. EXAM: PORTABLE CHEST 1 VIEW COMPARISON:  Chest x-ray 11/17/2011. CT abdomen and pelvis 03/11/2021. FINDINGS: There is patchy airspace disease in the bilateral lower lobes. There is likely a small left pleural effusion. Cardiac silhouette is enlarged, a new finding. No evidence for pneumothorax. No acute fractures. IMPRESSION: 1. New enlarged cardiac silhouette may represent cardiomegaly and or pericardial effusion. 2. Bilateral lower lobe airspace disease worrisome for infection. 3. Small left pleural effusion. Electronically Signed   By: Ronney Asters M.D.   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  Discussed with pt the need to start dialysis.  He is willing to proceed but understandably seems overwhelmed.   - will check UA and UP/C along with RUS to rule out anything unexpected (BOO,  GN).   - NPO past MN for [facility] - HD #1 orders written  2.  Hypertensive emergency - on cardene gtt, BP improving  3.  Acute on chronic CHF exacerbation: - will do Lasix 120 IV q 6 to help with volume removal until dialysis  - TTE pending  4.  Metabolic acidosis: - sodium bicarb 1300 TID- using pills to not add any more volume that what he already has on  5.  Acute hypoxic RF: - on 6L O2 - expect to improve with Lasix and then dialysis  6.  Anemia: - check iron panel, B12, folate  7.  BMM: - check PTH, phos, vit D  Deangela Randleman 01/23/2022, 6:54 PM

## 2022-01-23 NOTE — Progress Notes (Signed)
Patient gave $73, wallet, and car keys to sister.

## 2022-01-23 NOTE — Progress Notes (Signed)
eLink Physician-Brief Progress Note Patient Name: Tanner Rollins DOB: 29-Jul-1951 MRN: [id number]   Date of Service  01/23/2022  HPI/Events of Note  Multiple issues: 1. Repeat K+ = 6.4 and 2. Anemia - Hgb = 6.4.  eICU Interventions  Plan: D50 1 amp IV now. Novolog insulin 10 units IV now. Calcium gluconate 1 gm IV now.  NaHCO3 and Lokelma already ordered.  Will transfuse 1 unit PRBC.     Intervention Category Major Interventions: Electrolyte abnormality - evaluation and management  [name] 01/23/2022, 10:43 PM

## 2022-01-23 NOTE — Progress Notes (Signed)
eLink Physician-Brief Progress Note Patient Name: Tanner Rollins DOB: 1951-07-14 MRN: [id number]   Date of Service  01/23/2022  HPI/Events of Note  Hyperkalemia - K+ = 5.7.  eICU Interventions  Plan: Lokelma 10 gm PO X 1 now.  NaHCO3 100 meq IV X 1 now. Repeat BMP already ordered for AM.     Intervention Category Major Interventions: Electrolyte abnormality - evaluation and management  Hafsah Hendler Eugene 01/23/2022, 9:21 PM

## 2022-01-23 NOTE — H&P (Signed)
NAME:  Tanner Rollins, MRN:  [id number], DOB:  Dec 02, 1950, LOS: 0 ADMISSION DATE:  01/23/2022, CONSULTATION DATE:  01/23/22 REFERRING MD:  EDP, CHIEF COMPLAINT:  chest pain   History of Present Illness:  Tanner Rollins is a 71 y.o. M with PMH of HTN and CKD  who presents with worsening chest pain.  He reports financial difficulty obtaining his medications so stopped taking them approximately two months ago.  He reports intermittent chest pain over the last two weeks with sudden worsening yesterday; the pain is described as sharp and pressure-like, mid-sternal, does not radiate and is constant.   Has been eating and drinking less over the last couple of days as well, denies nausea and vomiting, headache or vision changes.    Pt denies significant ETOH use or drug use besides occasional marijuana  He was given Asa and nitro by EMS and had an initial BP of 228/130 in the ED.  He was started on a nitro gtt and then transitioned to cardene.   Labs significant for BNP 2684, troponin 323, lipase 210 and creatinine of 26 up from baseline of 4-5.  He is still complaining of chest pain at the time of admission  Pertinent  Medical History   has a past medical history of CKD (chronic kidney disease) stage 4, GFR 15-29 ml/min (HCC) and Hypertension.   Significant Hospital Events: Including procedures, antibiotic start and stop dates in addition to other pertinent events   6/7 admit to ICU with hypertensive emergency and acute on chronic renal failure  Interim History / Subjective:  As above  Objective   Blood pressure (!) 208/116, pulse 81, temperature (!) 97.5 F (36.4 C), temperature source Oral, resp. rate 18, height 5\' 11"  (1.803 m), weight 68 kg, SpO2 93 %.       No intake or output data in the 24 hours ending 01/23/22 1656 Filed Weights   01/23/22 1557  Weight: 68 kg    General:  thin M, resting in bed, uncomfortable-appearing but not in acute distress HEENT: MM pink/dry, sclera anicteric,  pupils equal Neuro: awake and alert, following commands, equal strength and sensation in all extremities, oriented x3 CV: s1s2 tachycardic, regular,  no m/r/g PULM:  clear bilaterally on nasal cannula, mildly tachypneic, no accessory muscle use or increased WOB GI: soft, bsx4 active  Extremities: warm/dry, no edema  Skin: no rashes or lesions   Resolved Hospital Problem list     Assessment & Plan:    Hypertensive Emergency  Elevated Troponin and BNP Stopped home medications months ago for financial reasons -admit to ICU -continue Cardene gtt, goal SBP 140 -continue Asa  -CXR without widened mediastinum and chest pain improving with reduction in BP, so hold CTA chest for now  -EKG not suggestive of acute ischemia -trend troponin, repeat EKG in the AM -stat echo, cxr with enlarged cardiac silhouette     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 Creatinine markedly elevated at 26 and BUN 140 likely secondary to hypertensive emergency -still urinating, give 1L IVF and follow renal indices and electrolytes -at risk for renal failure and dialysis  -avoid nephrotoxins and renally dose medications  Acute Hypoxic Respiratory Failure Currently requiring 6L [place], none at baseline -CXR with effusions vs infiltrates -no leukocytosis or fever, lactic WNL, hold empiric abx and check procalcitonin  Elevated Lipase Abdomen non-tender and no ETOH use, low suspicion for acute pancreatitis currently -continue to monitor for nausea, vomiting, abdominal pain    Best Practice (right  click and "Reselect all SmartList Selections" daily)   Diet/type: Regular consistency (see orders) DVT prophylaxis: SCD GI prophylaxis: N/A Lines: N/A Foley:  N/A Code Status:  full code Last date of multidisciplinary goals of care discussion [pending, confirms full code 6/7, does not want any family updated]  Labs   CBC: Recent Labs  Lab 01/23/22 1515 01/23/22 1527 01/23/22 1548  WBC 10.0  --    --   NEUTROABS 8.0*  --   --   HGB 8.0* 5.4* 7.8*  HCT 24.2* 16.0* 23.0*  MCV 85.2  --   --   PLT 132*  --   --     Basic Metabolic Panel: Recent Labs  Lab 01/23/22 1515 01/23/22 1527 01/23/22 1548  NA 137 136 136  K 5.0 4.9 4.9  CL 108  --   --   CO2 9*  --   --   GLUCOSE 135*  --   --   BUN 140*  --   --   CREATININE 26.11*  --   --   CALCIUM 8.9  --   --    GFR: Estimated Creatinine Clearance: 2.6 mL/min (A) (by C-G formula based on SCr of 26.11 mg/dL (H)). Recent Labs  Lab 01/23/22 1515  WBC 10.0    Liver Function Tests: Recent Labs  Lab 01/23/22 1515  AST 24  ALT 14  ALKPHOS 81  BILITOT 0.9  PROT 6.2*  ALBUMIN 3.0*   Recent Labs  Lab 01/23/22 1515  LIPASE 210*   No results for input(s): AMMONIA in the last 168 hours.  ABG    Component Value Date/Time   HCO3 10.8 (L) 01/23/2022 1548   TCO2 12 (L) 01/23/2022 1548   ACIDBASEDEF 15.0 (H) 01/23/2022 1548   O2SAT 72 01/23/2022 1548     Coagulation Profile: No results for input(s): INR, PROTIME in the last 168 hours.  Cardiac Enzymes: No results for input(s): CKTOTAL, CKMB, CKMBINDEX, TROPONINI in the last 168 hours.  HbA1C: Hgb A1c MFr Bld  Date/Time Value Ref Range Status  03/10/2021 05:06 PM 5.5 4.8 - 5.6 % Final    Comment:    (NOTE)         Prediabetes: 5.7 - 6.4         Diabetes: >6.4         Glycemic control for adults with diabetes: <7.0     CBG: No results for input(s): GLUCAP in the last 168 hours.  Review of Systems:   Please see the history of present illness. All other systems reviewed and are negative    Past Medical History:  He,  has a past medical history of CKD (chronic kidney disease) stage 4, GFR 15-29 ml/min (HCC) and Hypertension.   Surgical History:  History reviewed. No pertinent surgical history.   Social History:   reports that he has been smoking cigarettes. He does not have any smokeless tobacco history on file.   Family History:  His family  history is not on file.   Allergies No Known Allergies   Home Medications  Prior to Admission medications   Medication Sig Start Date End Date Taking? Authorizing Provider  amLODipine (NORVASC) 10 MG tablet Take 1 tablet by mouth daily. 03/01/21   [provider]  aspirin EC 81 MG EC tablet Take 1 tablet (81 mg total) by mouth daily. Swallow whole. 03/13/21   Atway, Rayann N, DO  atorvastatin (LIPITOR) 40 MG tablet Take 1 tablet (40 mg total) by mouth daily.  03/12/21 04/11/21  Atway, Jeananne Rama, DO  chlorthalidone (HYGROTON) 25 MG tablet Take 0.5 tablets (12.5 mg total) by mouth daily. 03/12/21 04/11/21  Dorethea Clan, DO     Critical care time: 45 minutes     CRITICAL CARE Performed by: Otilio Carpen Sia Gabrielsen   Total critical care time: 45 minutes  Critical care time was exclusive of separately billable procedures and treating other patients.  Critical care was necessary to treat or prevent imminent or life-threatening deterioration.  Critical care was time spent personally by me on the following activities: development of treatment plan with patient and/or surrogate as well as nursing, discussions with consultants, evaluation of patient's response to treatment, examination of patient, obtaining history from patient or surrogate, ordering and performing treatments and interventions, ordering and review of laboratory studies, ordering and review of radiographic studies, pulse oximetry and re-evaluation of patient's condition.  Otilio Carpen Bahar Shelden, PA-C [facility] See Amion for pager If no response to pager , please call [phone] until 7pm After 7:00 pm call Elink  [phone]?[place]

## 2022-01-23 NOTE — ED Provider Triage Note (Addendum)
Emergency Medicine Provider Triage Evaluation Note  Tanner Rollins , a 71 y.o. male  was evaluated in triage.  Pt complains of chest pain.  Going on for 2 weeks but worsened yesterday.  Is constant as of yesterday, its worse with inspiration.  Radiates to his back, he feels short of breath denies any nausea or vomiting..  Also was having stomach pain per triage note, patient did not mention this with me.  Does not wear oxygen at home, needing to their supplemental oxygen here.  89% on room air.  Review of Systems  Per HPI  Physical Exam  BP (!) 173/113 (BP Location: Right Arm)   Pulse 72   Temp (!) 97.5 F (36.4 C) (Oral)   Resp (!) 22   SpO2 90%  Gen:   Awake, no distress   Resp:  Normal effort  MSK:   Moves extremities without difficulty  Other:  Tachypneic   Medical Decision Making  Medically screening exam initiated at 2:53 PM.  Appropriate orders placed.  Tanner Rollins was informed that the remainder of the evaluation will be completed by another provider, this initial triage assessment does not replace that evaluation, and the importance of remaining in the ED until their evaluation is complete.     Sherrill Raring, PA-C 01/23/22 [place], Aureliano Oshields, PA-C 01/23/22 1455

## 2022-01-23 NOTE — ED Provider Notes (Signed)
[facility] Provider Note  CSN: [id number] Arrival date & time: 01/23/22 1437  Chief Complaint(s) Chest Pain  HPI Tanner Rollins is a 71 y.o. male with PMH CKD 4, HTN who presents emergency department for evaluation of chest pain.  Patient states that he did not take his blood pressure meds prior to arrival due to worsening chest pain.  He states that he has had intermittent pressure-like chest pain over the last 2 weeks but yesterday it got significantly worse.  He states that his pain is worse with inspiration and he feels short of breath.  He denies associated nausea, vomiting.  Patient arrives hypoxic on room air and on my arrival is on 6 L nasal cannula saturating between 90 and 92%.  Denies abdominal pain, headache, fever or other systemic symptoms.  Patient given aspirin and 3 nitroglycerin tablets by EMS with minimal improvement.   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 Authorizing Provider  aspirin EC 81 MG EC tablet Take 1 tablet (81 mg total) by mouth daily. Swallow whole. Patient not taking: Reported on 01/23/2022 03/13/21   Atway, Rayann N, DO  atorvastatin (LIPITOR) 40 MG tablet Take 1 tablet (40 mg total) by mouth daily. Patient not taking: Reported on 01/23/2022 03/12/21 04/11/21  Atway, Jeananne Rama, DO  chlorthalidone (HYGROTON) 25 MG tablet Take 0.5 tablets (12.5 mg total) by mouth daily. Patient not taking: Reported on  01/23/2022 03/12/21 04/11/21  Dorethea Clan, DO                                                                                                                                    Past Surgical History History reviewed. No pertinent surgical history. Family History History reviewed. No pertinent family history.  Social History Social History   Tobacco Use   Smoking status: Some Days    Types: Cigarettes   Allergies Patient has no known allergies.  Review of Systems Review of Systems  Respiratory:  Positive for shortness of breath.   Cardiovascular:  Positive for chest pain.   Physical Exam Vital Signs  I have reviewed the triage vital signs BP 120/68   Pulse 68   Temp (!) 97.4 F (36.3 C) (Axillary) Comment: gave warm blankets  Resp 18   Ht 5' 11.5" (1.816 m)   Wt 70.1 kg  SpO2 97%   BMI 21.25 kg/m   Physical Exam Constitutional:      General: He is not in acute distress.    Appearance: Normal appearance. He is ill-appearing.  HENT:     Head: Normocephalic and atraumatic.     Nose: No congestion or rhinorrhea.  Eyes:     General:        Right eye: No discharge.        Left eye: No discharge.     Extraocular Movements: Extraocular movements intact.     Pupils: Pupils are equal, round, and reactive to light.  Cardiovascular:     Rate and Rhythm: Normal rate and regular rhythm.     Heart sounds: No murmur heard. Pulmonary:     Effort: Tachypnea present. No respiratory distress.     Breath sounds: No wheezing or rales.  Abdominal:     General: There is no distension.     Tenderness: There is no abdominal tenderness.  Musculoskeletal:        General: Normal range of motion.     Cervical back: Normal range of motion.  Skin:    General: Skin is warm and dry.  Neurological:     General: No focal deficit present.     Mental Status: He is alert.    ED Results and Treatments Labs (all labs ordered are listed, but only abnormal results are displayed) Labs  Reviewed  CBC WITH DIFFERENTIAL/PLATELET - Abnormal; Notable for the following components:      Result Value   RBC 2.84 (*)    Hemoglobin 8.0 (*)    HCT 24.2 (*)    RDW 17.2 (*)    Platelets 132 (*)    Neutro Abs 8.0 (*)    All other components within normal limits  COMPREHENSIVE METABOLIC PANEL - Abnormal; Notable for the following components:   CO2 9 (*)    Glucose, Bld 135 (*)    BUN 140 (*)    Creatinine, Ser 26.11 (*)    Total Protein 6.2 (*)    Albumin 3.0 (*)    GFR, Estimated 2 (*)    Anion gap 20 (*)    All other components within normal limits  LIPASE, BLOOD - Abnormal; Notable for the following components:   Lipase 210 (*)    All other components within normal limits  BRAIN NATRIURETIC PEPTIDE - Abnormal; Notable for the following components:   B Natriuretic Peptide 2,684.7 (*)    All other components within normal limits  BLOOD GAS, VENOUS - Abnormal; Notable for the following components:   pCO2, Ven 20 (*)    Bicarbonate 10.1 (*)    Acid-base deficit 14.3 (*)    All other components within normal limits  LACTIC ACID, PLASMA - Abnormal; Notable for the following components:   Lactic Acid, Venous 2.0 (*)    All other components within normal limits  RENAL FUNCTION PANEL - Abnormal; Notable for the following components:   Potassium 5.7 (*)    CO2 8 (*)    Glucose, Bld 128 (*)    BUN 150 (*)    Creatinine, Ser 27.02 (*)    Phosphorus 9.0 (*)    Albumin 2.9 (*)    GFR, Estimated 2 (*)    Anion gap 21 (*)    All other components within normal limits  GLUCOSE, CAPILLARY - Abnormal; Notable for the following components:   Glucose-Capillary 115 (*)    All other components within normal limits  I-STAT  VENOUS BLOOD GAS, ED - Abnormal; Notable for the following components:   pCO2, Ven 21.2 (*)    pO2, Ven 94 (*)    Bicarbonate 10.0 (*)    TCO2 11 (*)    Acid-base deficit 15.0 (*)    Calcium, Ion 1.10 (*)    HCT 16.0 (*)    Hemoglobin 5.4 (*)    All other  components within normal limits  I-STAT VENOUS BLOOD GAS, ED - Abnormal; Notable for the following components:   pH, Ven 7.219 (*)    pCO2, Ven 26.5 (*)    Bicarbonate 10.8 (*)    TCO2 12 (*)    Acid-base deficit 15.0 (*)    Calcium, Ion 1.13 (*)    HCT 23.0 (*)    Hemoglobin 7.8 (*)    All other components within normal limits  TROPONIN I (HIGH SENSITIVITY) - Abnormal; Notable for the following components:   Troponin I (High Sensitivity) 323 (*)    All other components within normal limits  TROPONIN I (HIGH SENSITIVITY) - Abnormal; Notable for the following components:   Troponin I (High Sensitivity) 304 (*)    All other components within normal limits  MRSA NEXT GEN BY PCR, NASAL  LACTIC ACID, PLASMA  PROCALCITONIN  CBC  BASIC METABOLIC PANEL  BLOOD GAS, ARTERIAL  MAGNESIUM  PHOSPHORUS  BASIC METABOLIC PANEL  CBC  HEPATITIS B SURFACE ANTIGEN  HEPATITIS B SURFACE ANTIBODY,QUALITATIVE  HEPATITIS C ANTIBODY  URINALYSIS, ROUTINE W REFLEX MICROSCOPIC  IRON AND TIBC  FERRITIN  VITAMIN B12  FOLATE  PARATHYROID HORMONE, INTACT (NO CA)  VITAMIN D 25 HYDROXY (VIT D DEFICIENCY, FRACTURES)  I-STAT CHEM 8, ED                                                                                                                          Radiology DG Chest Portable 1 View  Result Date: 01/23/2022 CLINICAL DATA:  Chest pain. EXAM: PORTABLE CHEST 1 VIEW COMPARISON:  Chest x-ray 11/17/2011. CT abdomen and pelvis 03/11/2021. FINDINGS: There is patchy airspace disease in the bilateral lower lobes. There is likely a small left pleural effusion. Cardiac silhouette is enlarged, a new finding. No evidence for pneumothorax. No acute fractures. IMPRESSION: 1. New enlarged cardiac silhouette may represent cardiomegaly and or pericardial effusion. 2. Bilateral lower lobe airspace disease worrisome for infection. 3. Small left pleural effusion. Electronically Signed   By: Ronney Asters M.D.   On: 01/23/2022  17:11    Pertinent labs & imaging results that were available during my care of the patient were reviewed by me and considered in my medical decision making (see MDM for details).  Medications Ordered in ED Medications  nicardipine (CARDENE) 20mg  in 0.86% saline 282ml IV infusion (0.1 mg/ml) (12.5 mg/hr Intravenous Infusion Verify 01/23/22 2000)  docusate sodium (COLACE) capsule 100 mg (has no administration in time range)  polyethylene glycol (MIRALAX / GLYCOLAX) packet 17 g (has no administration in time range)  aspirin  EC tablet 81 mg (81 mg Oral Given 01/23/22 1847)  Chlorhexidine Gluconate Cloth 2 % PADS 6 each (6 each Topical Given 01/23/22 1830)  acetaminophen (TYLENOL) tablet 650 mg (has no administration in time range)  nitroGLYCERIN (NITROSTAT) SL tablet 0.4 mg (has no administration in time range)  sodium bicarbonate tablet 1,300 mg (1,300 mg Oral Given 01/23/22 1847)  Chlorhexidine Gluconate Cloth 2 % PADS 6 each (has no administration in time range)  pentafluoroprop-tetrafluoroeth (GEBAUERS) aerosol 1 application. (has no administration in time range)  lidocaine (PF) (XYLOCAINE) 1 % injection 5 mL (has no administration in time range)  lidocaine-prilocaine (EMLA) cream 1 application. (has no administration in time range)  heparin injection 1,000 Units (has no administration in time range)  anticoagulant sodium citrate solution 5 mL (has no administration in time range)  alteplase (CATHFLO ACTIVASE) injection 2 mg (has no administration in time range)  furosemide (LASIX) 120 mg in dextrose 5 % 50 mL IVPB (has no administration in time range)  fentaNYL (SUBLIMAZE) injection 25 mcg (has no administration in time range)  MEDLINE mouth rinse (has no administration in time range)  fentaNYL (SUBLIMAZE) injection 50 mcg (50 mcg Intravenous Given 01/23/22 1532)  lactated ringers bolus 1,000 mL (1,000 mLs Intravenous New Bag/Given 01/23/22 1757)                                                                                                                                      Procedures .Critical Care Performed by: Teressa Lower, MD Authorized by: Teressa Lower, MD   Critical care provider statement:    Critical care time (minutes):  30   Critical care was necessary to treat or prevent imminent or life-threatening deterioration of the following conditions:  Renal failure   Critical care was time spent personally by me on the following activities:  Development of treatment plan with patient or surrogate, discussions with consultants, evaluation of patient's response to treatment, examination of patient, ordering and review of laboratory studies, ordering and review of radiographic studies, ordering and performing treatments and interventions, pulse oximetry, re-evaluation of patient's condition and review of old charts  (including critical care time)  Medical Decision Making / ED Course   This patient presents to the ED for concern of chest pain, hypertension, this involves an extensive number of treatment options, and is a complaint that carries with it a high risk of complications and morbidity.  The differential diagnosis includes hypertensive urgency, hypertensive emergency, aortic dissection, PE, ACS  MDM: Patient seen emergency room for evaluation of chest pain and high blood pressure.  Physical exam reveals an uncomfortable appearing patient but cardiopulmonary exam is unremarkable.  Abdominal exam unremarkable.  Initial laboratory evaluation with hemoglobin of 8.0, pH 7.219 with a bicarb of 10.8, lipase 210, chemistry concerning with a BUN of 140 and creatinine 26.11 which is significantly elevated from his baseline of 510 months ago.  Initial troponin 323  with delta troponin 304.  Chest x-ray with new cardiomegaly and bilateral lower airspace disease.  In the setting of persistent chest pain, patient initially started on a nitro drip, but on return of laboratory evaluation, it  appears that the patient's presentation is less consistent with ACS and more consistent with hypertensive emergency and thus the nitro drip was discontinued and patient was started on nicardipine.  Given patient's significantly worsening AKI, we cannot pursue contrasted imaging in the emergency department unless a decision has been made about patient receiving dialysis.  I consulted nephrology and they stated it is likely that he will require at least temporary dialysis but the patient will need an ICU admission regardless.  ICU then consulted and the patient admitted to the ICU for hypertensive emergency   Additional history obtained:  -External records from outside source obtained and reviewed including: Chart review including previous notes, labs, imaging, consultation notes   Lab Tests: -I ordered, reviewed, and interpreted labs.   The pertinent results include:   Labs Reviewed  CBC WITH DIFFERENTIAL/PLATELET - Abnormal; Notable for the following components:      Result Value   RBC 2.84 (*)    Hemoglobin 8.0 (*)    HCT 24.2 (*)    RDW 17.2 (*)    Platelets 132 (*)    Neutro Abs 8.0 (*)    All other components within normal limits  COMPREHENSIVE METABOLIC PANEL - Abnormal; Notable for the following components:   CO2 9 (*)    Glucose, Bld 135 (*)    BUN 140 (*)    Creatinine, Ser 26.11 (*)    Total Protein 6.2 (*)    Albumin 3.0 (*)    GFR, Estimated 2 (*)    Anion gap 20 (*)    All other components within normal limits  LIPASE, BLOOD - Abnormal; Notable for the following components:   Lipase 210 (*)    All other components within normal limits  BRAIN NATRIURETIC PEPTIDE - Abnormal; Notable for the following components:   B Natriuretic Peptide 2,684.7 (*)    All other components within normal limits  BLOOD GAS, VENOUS - Abnormal; Notable for the following components:   pCO2, Ven 20 (*)    Bicarbonate 10.1 (*)    Acid-base deficit 14.3 (*)    All other components within  normal limits  LACTIC ACID, PLASMA - Abnormal; Notable for the following components:   Lactic Acid, Venous 2.0 (*)    All other components within normal limits  RENAL FUNCTION PANEL - Abnormal; Notable for the following components:   Potassium 5.7 (*)    CO2 8 (*)    Glucose, Bld 128 (*)    BUN 150 (*)    Creatinine, Ser 27.02 (*)    Phosphorus 9.0 (*)    Albumin 2.9 (*)    GFR, Estimated 2 (*)    Anion gap 21 (*)    All other components within normal limits  GLUCOSE, CAPILLARY - Abnormal; Notable for the following components:   Glucose-Capillary 115 (*)    All other components within normal limits  I-STAT VENOUS BLOOD GAS, ED - Abnormal; Notable for the following components:   pCO2, Ven 21.2 (*)    pO2, Ven 94 (*)    Bicarbonate 10.0 (*)    TCO2 11 (*)    Acid-base deficit 15.0 (*)    Calcium, Ion 1.10 (*)    HCT 16.0 (*)    Hemoglobin 5.4 (*)    All other  components within normal limits  I-STAT VENOUS BLOOD GAS, ED - Abnormal; Notable for the following components:   pH, Ven 7.219 (*)    pCO2, Ven 26.5 (*)    Bicarbonate 10.8 (*)    TCO2 12 (*)    Acid-base deficit 15.0 (*)    Calcium, Ion 1.13 (*)    HCT 23.0 (*)    Hemoglobin 7.8 (*)    All other components within normal limits  TROPONIN I (HIGH SENSITIVITY) - Abnormal; Notable for the following components:   Troponin I (High Sensitivity) 323 (*)    All other components within normal limits  TROPONIN I (HIGH SENSITIVITY) - Abnormal; Notable for the following components:   Troponin I (High Sensitivity) 304 (*)    All other components within normal limits  MRSA NEXT GEN BY PCR, NASAL  LACTIC ACID, PLASMA  PROCALCITONIN  CBC  BASIC METABOLIC PANEL  BLOOD GAS, ARTERIAL  MAGNESIUM  PHOSPHORUS  BASIC METABOLIC PANEL  CBC  HEPATITIS B SURFACE ANTIGEN  HEPATITIS B SURFACE ANTIBODY,QUALITATIVE  HEPATITIS C ANTIBODY  URINALYSIS, ROUTINE W REFLEX MICROSCOPIC  IRON AND TIBC  FERRITIN  VITAMIN B12  FOLATE   PARATHYROID HORMONE, INTACT (NO CA)  VITAMIN D 25 HYDROXY (VIT D DEFICIENCY, FRACTURES)  I-STAT CHEM 8, ED      EKG   EKG Interpretation  Date/Time:  Wednesday January 23 2022 14:53:07 EDT Ventricular Rate:  73 PR Interval:  178 QRS Duration: 90 QT Interval:  430 QTC Calculation: 473 R Axis:   -10 Text Interpretation: Sinus rhythm with Premature atrial complexes Abnormal ECG When compared with ECG of 10-Mar-2021 16:52, PREVIOUS ECG IS PRESENT Confirmed by [place] ([id number]) on 01/23/2022 3:09:23 PM         Imaging Studies ordered: I ordered imaging studies including x-ray chest I independently visualized and interpreted imaging. I agree with the radiologist interpretation   Medicines ordered and prescription drug management: Meds ordered this encounter  Medications   DISCONTD: nitroGLYCERIN 50 mg in dextrose 5 % 250 mL (0.2 mg/mL) infusion   fentaNYL (SUBLIMAZE) injection 50 mcg   nicardipine (CARDENE) 20mg  in 0.86% saline 252ml IV infusion (0.1 mg/ml)   docusate sodium (COLACE) capsule 100 mg   polyethylene glycol (MIRALAX / GLYCOLAX) packet 17 g   lactated ringers bolus 1,000 mL   aspirin EC tablet 81 mg   Chlorhexidine Gluconate Cloth 2 % PADS 6 each   acetaminophen (TYLENOL) tablet 650 mg   nitroGLYCERIN (NITROSTAT) SL tablet 0.4 mg   DISCONTD: lactated ringers infusion   DISCONTD: fentaNYL (SUBLIMAZE) injection 50 mcg   sodium bicarbonate tablet 1,300 mg   Chlorhexidine Gluconate Cloth 2 % PADS 6 each   pentafluoroprop-tetrafluoroeth (GEBAUERS) aerosol 1 application.   lidocaine (PF) (XYLOCAINE) 1 % injection 5 mL   lidocaine-prilocaine (EMLA) cream 1 application.   heparin injection 1,000 Units   anticoagulant sodium citrate solution 5 mL   alteplase (CATHFLO ACTIVASE) injection 2 mg   furosemide (LASIX) 120 mg in dextrose 5 % 50 mL IVPB   fentaNYL (SUBLIMAZE) injection 25 mcg   MEDLINE mouth rinse    -I have reviewed the patients home medicines and have  made adjustments as needed  Critical interventions Nicardipine, nitroglycerin drip, ICU consultation  Consultations Obtained: I requested consultation with the intensivist and nephrologist,  and discussed lab and imaging findings as well as pertinent plan - they recommend: ICU admission and blood pressure management   Cardiac Monitoring: The patient was maintained on a cardiac monitor.  I personally viewed and interpreted the cardiac monitored which showed an underlying rhythm of: NSR  Social Determinants of Health:  Factors impacting patients care include: none   Reevaluation: After the interventions noted above, I reevaluated the patient and found that they have :improved  Co morbidities that complicate the patient evaluation  Past Medical History:  Diagnosis Date   CKD (chronic kidney disease) stage 4, GFR 15-29 ml/min ([place])    Hypertension       Dispostion: I considered admission for this patient, and due to hypertensive emergency patient require ICU admission     Final Clinical Impression(s) / ED Diagnoses Final diagnoses:  Hypertensive emergency  Chest pain, unspecified type     @PCDICTATION @    Teressa Lower, MD 01/23/22 2102

## 2022-01-24 ENCOUNTER — Inpatient Hospital Stay (HOSPITAL_COMMUNITY): Payer: Medicare Other

## 2022-01-24 DIAGNOSIS — R079 Chest pain, unspecified: Secondary | ICD-10-CM

## 2022-01-24 DIAGNOSIS — E8729 Other acidosis: Secondary | ICD-10-CM

## 2022-01-24 HISTORY — PX: IR US GUIDE VASC ACCESS RIGHT: IMG2390

## 2022-01-24 HISTORY — PX: IR FLUORO GUIDE CV LINE RIGHT: IMG2283

## 2022-01-24 LAB — POCT I-STAT 7, (LYTES, BLD GAS, ICA,H+H)
Acid-base deficit: 12 mmol/L — ABNORMAL HIGH (ref 0.0–2.0)
Bicarbonate: 11.8 mmol/L — ABNORMAL LOW (ref 20.0–28.0)
Calcium, Ion: 1.11 mmol/L — ABNORMAL LOW (ref 1.15–1.40)
HCT: 21 % — ABNORMAL LOW (ref 39.0–52.0)
Hemoglobin: 7.1 g/dL — ABNORMAL LOW (ref 13.0–17.0)
O2 Saturation: 92 %
Patient temperature: 97.5
Potassium: 5.1 mmol/L (ref 3.5–5.1)
Sodium: 135 mmol/L (ref 135–145)
TCO2: 12 mmol/L — ABNORMAL LOW (ref 22–32)
pCO2 arterial: 19.2 mmHg — CL (ref 32–48)
pH, Arterial: 7.393 (ref 7.35–7.45)
pO2, Arterial: 60 mmHg — ABNORMAL LOW (ref 83–108)

## 2022-01-24 LAB — HEPATITIS B SURFACE ANTIBODY,QUALITATIVE: Hep B S Ab: NONREACTIVE

## 2022-01-24 LAB — FERRITIN: Ferritin: 654 ng/mL — ABNORMAL HIGH (ref 24–336)

## 2022-01-24 LAB — ECHOCARDIOGRAM COMPLETE
AR max vel: 3.67 cm2
AV Area VTI: 3.65 cm2
AV Area mean vel: 3.61 cm2
AV Mean grad: 6 mmHg
AV Peak grad: 11.8 mmHg
Ao pk vel: 1.72 m/s
Area-P 1/2: 2.56 cm2
Calc EF: 60.4 %
Height: 71.5 in
MV VTI: 3.9 cm2
S' Lateral: 3.1 cm
Single Plane A2C EF: 72.3 %
Single Plane A4C EF: 47.2 %
Weight: 2553.81 oz

## 2022-01-24 LAB — FOLATE: Folate: 26.8 ng/mL (ref >5.9)

## 2022-01-24 LAB — IRON AND TIBC
Iron: 37 ug/dL — ABNORMAL LOW (ref 45–182)
Saturation Ratios: 19 % (ref 17.9–39.5)
TIBC: 196 ug/dL — ABNORMAL LOW (ref 250–450)
UIBC: 159 ug/dL

## 2022-01-24 LAB — CBC
HCT: 20.3 % — ABNORMAL LOW (ref 39.0–52.0)
Hemoglobin: 6.9 g/dL — CL (ref 13.0–17.0)
MCH: 27.8 pg (ref 26.0–34.0)
MCHC: 34 g/dL (ref 30.0–36.0)
MCV: 81.9 fL (ref 80.0–100.0)
Platelets: 119 10*3/uL — ABNORMAL LOW (ref 150–400)
RBC: 2.48 MIL/uL — ABNORMAL LOW (ref 4.22–5.81)
RDW: 16.7 % — ABNORMAL HIGH (ref 11.5–15.5)
WBC: 12.5 10*3/uL — ABNORMAL HIGH (ref 4.0–10.5)
nRBC: 0 % (ref 0.0–0.2)

## 2022-01-24 LAB — BASIC METABOLIC PANEL
Anion gap: 20 — ABNORMAL HIGH (ref 5–15)
BUN: 145 mg/dL — ABNORMAL HIGH (ref 8–23)
CO2: 12 mmol/L — ABNORMAL LOW (ref 22–32)
Calcium: 8.8 mg/dL — ABNORMAL LOW (ref 8.9–10.3)
Chloride: 106 mmol/L (ref 98–111)
Creatinine, Ser: 24.4 mg/dL — ABNORMAL HIGH (ref 0.61–1.24)
GFR, Estimated: 2 mL/min — ABNORMAL LOW (ref >60)
Glucose, Bld: 172 mg/dL — ABNORMAL HIGH (ref 70–99)
Potassium: 4.5 mmol/L (ref 3.5–5.1)
Sodium: 138 mmol/L (ref 135–145)

## 2022-01-24 LAB — MAGNESIUM: Magnesium: 2.2 mg/dL (ref 1.7–2.4)

## 2022-01-24 LAB — PREPARE RBC (CROSSMATCH)

## 2022-01-24 LAB — VITAMIN B12: Vitamin B-12: 909 pg/mL (ref 180–914)

## 2022-01-24 LAB — GLUCOSE, CAPILLARY: Glucose-Capillary: 161 mg/dL — ABNORMAL HIGH (ref 70–99)

## 2022-01-24 LAB — HEPATITIS C ANTIBODY: HCV Ab: NONREACTIVE

## 2022-01-24 LAB — HEPATITIS B SURFACE ANTIGEN: Hepatitis B Surface Ag: NONREACTIVE

## 2022-01-24 LAB — PHOSPHORUS: Phosphorus: 9.1 mg/dL — ABNORMAL HIGH (ref 2.5–4.6)

## 2022-01-24 LAB — VITAMIN D 25 HYDROXY (VIT D DEFICIENCY, FRACTURES): Vit D, 25-Hydroxy: 21.04 ng/mL — ABNORMAL LOW (ref 30–100)

## 2022-01-24 LAB — HEPATITIS B CORE ANTIBODY, TOTAL: Hep B Core Total Ab: NONREACTIVE

## 2022-01-24 MED ORDER — SODIUM CHLORIDE 0.9 % IV SOLN
2.0000 g | INTRAVENOUS | Status: AC
  Administered 2022-01-24 – 2022-01-28 (×5): 2 g via INTRAVENOUS
  Filled 2022-01-24 (×4): qty 20

## 2022-01-24 MED ORDER — LORAZEPAM 0.5 MG PO TABS
0.5000 mg | ORAL_TABLET | Freq: Once | ORAL | Status: AC
  Administered 2022-01-24: 0.5 mg via SUBLINGUAL
  Filled 2022-01-24: qty 1

## 2022-01-24 MED ORDER — HEPARIN SODIUM (PORCINE) 1000 UNIT/ML IJ SOLN
INTRAMUSCULAR | Status: AC
  Administered 2022-01-24: 3.8 mL
  Filled 2022-01-24: qty 10

## 2022-01-24 MED ORDER — MIDAZOLAM HCL 2 MG/2ML IJ SOLN
INTRAMUSCULAR | Status: AC
  Filled 2022-01-24: qty 2

## 2022-01-24 MED ORDER — STERILE WATER FOR INJECTION IV SOLN
INTRAVENOUS | Status: DC
  Filled 2022-01-24: qty 1000

## 2022-01-24 MED ORDER — LACTATED RINGERS IV BOLUS
1000.0000 mL | Freq: Once | INTRAVENOUS | Status: AC
  Administered 2022-01-24: 1000 mL via INTRAVENOUS

## 2022-01-24 MED ORDER — CEFAZOLIN SODIUM-DEXTROSE 2-4 GM/100ML-% IV SOLN
2.0000 g | INTRAVENOUS | Status: AC
Start: 2022-01-24 — End: 2022-01-24
  Administered 2022-01-24: 2 g via INTRAVENOUS
  Filled 2022-01-24: qty 100

## 2022-01-24 MED ORDER — AMLODIPINE BESYLATE 10 MG PO TABS
10.0000 mg | ORAL_TABLET | Freq: Every day | ORAL | Status: DC
Start: 2022-01-24 — End: 2022-01-30
  Administered 2022-01-24 – 2022-01-29 (×6): 10 mg via ORAL
  Filled 2022-01-24 (×6): qty 1

## 2022-01-24 MED ORDER — LIDOCAINE-EPINEPHRINE 1 %-1:100000 IJ SOLN
INTRAMUSCULAR | Status: AC
  Administered 2022-01-24: 15 mL
  Filled 2022-01-24: qty 1

## 2022-01-24 MED ORDER — ONDANSETRON HCL 4 MG/2ML IJ SOLN
4.0000 mg | Freq: Four times a day (QID) | INTRAMUSCULAR | Status: DC | PRN
  Administered 2022-01-24: 4 mg via INTRAVENOUS
  Filled 2022-01-24: qty 2

## 2022-01-24 MED ORDER — CHLORHEXIDINE GLUCONATE CLOTH 2 % EX PADS
6.0000 | MEDICATED_PAD | Freq: Every day | CUTANEOUS | Status: DC
  Administered 2022-01-24 – 2022-01-31 (×7): 6 via TOPICAL

## 2022-01-24 MED ORDER — HEPARIN SODIUM (PORCINE) 5000 UNIT/ML IJ SOLN
5000.0000 [IU] | Freq: Three times a day (TID) | INTRAMUSCULAR | Status: DC
  Administered 2022-01-24 – 2022-01-31 (×20): 5000 [IU] via SUBCUTANEOUS
  Filled 2022-01-24 (×20): qty 1

## 2022-01-24 MED ORDER — CALCIUM ACETATE (PHOS BINDER) 667 MG PO CAPS
1334.0000 mg | ORAL_CAPSULE | Freq: Three times a day (TID) | ORAL | Status: DC
  Administered 2022-01-24 – 2022-01-31 (×17): 1334 mg via ORAL
  Filled 2022-01-24 (×20): qty 2

## 2022-01-24 MED ORDER — HYDRALAZINE HCL 50 MG PO TABS
50.0000 mg | ORAL_TABLET | Freq: Four times a day (QID) | ORAL | Status: DC
Start: 2022-01-24 — End: 2022-01-30
  Administered 2022-01-24 – 2022-01-30 (×20): 50 mg via ORAL
  Filled 2022-01-24 (×20): qty 1

## 2022-01-24 MED ORDER — FENTANYL CITRATE (PF) 100 MCG/2ML IJ SOLN
INTRAMUSCULAR | Status: AC
  Filled 2022-01-24: qty 2

## 2022-01-24 NOTE — Procedures (Signed)
  Procedure:  R IJ tunneled HD CVC placement Palindrome 23 Preprocedure diagnosis: The primary encounter diagnosis was Hypertensive emergency. A diagnosis of Chest pain, unspecified type was also pertinent to this visit.  Postprocedure diagnosis: same EBL:    minimal Complications:   none immediate  See full dictation in [facility].  Dillard Cannon MD Main # [phone] Pager  [phone] Mobile [phone]

## 2022-01-24 NOTE — Progress Notes (Signed)
NAME:  Denard Tuminello, MRN:  [id number], DOB:  10/09/1950, LOS: 1 ADMISSION DATE:  01/23/2022, CONSULTATION DATE:  01/24/22 REFERRING MD:  EDP, CHIEF COMPLAINT:  chest pain   History of Present Illness:  Aveer Bartow is a 71 y.o. M with PMH of HTN and CKD  who presents with worsening chest pain.  He reports financial difficulty obtaining his medications so stopped taking them approximately two months ago.  He reports intermittent chest pain over the last two weeks with sudden worsening yesterday; the pain is described as sharp and pressure-like, mid-sternal, does not radiate and is constant.   Has been eating and drinking less over the last couple of days as well, denies nausea and vomiting, headache or vision changes.    Pt denies significant ETOH use or drug use besides occasional marijuana  He was given Asa and nitro by EMS and had an initial BP of 228/130 in the ED.  He was started on a nitro gtt and then transitioned to cardene.   Labs significant for BNP 2684, troponin 323, lipase 210 and creatinine of 26 up from baseline of 4-5.  He is still complaining of chest pain at the time of admission  Pertinent  Medical History   has a past medical history of CKD (chronic kidney disease) stage 4, GFR 15-29 ml/min (HCC) and Hypertension.   Significant Hospital Events: Including procedures, antibiotic start and stop dates in addition to other pertinent events   6/7 admit to ICU with hypertensive emergency and acute on chronic renal failure  Interim History / Subjective:  Received multiple doses lasix overnight but no UOP. This AM, fluid challenge after discussion with nephrology.  Likely to require dialysis regardless.  Objective   Blood pressure 135/79, pulse 67, temperature 97.9 F (36.6 C), temperature source Oral, resp. rate 16, height 5' 11.5" (1.816 m), weight 72.4 kg, SpO2 94 %.        Intake/Output Summary (Last 24 hours) at 01/24/2022 0845 Last data filed at 01/24/2022 0445 Gross  per 24 hour  Intake 2619.07 ml  Output --  Net 2619.07 ml   Filed Weights   01/23/22 1557 01/23/22 1824 01/24/22 0455  Weight: 68 kg 70.1 kg 72.4 kg    General:  thin M, resting in bed, in NAD HEENT: MM pink/dry, sclera anicteric, EOMI Neuro: A&O x 3, no deficits CV: RRR, no M/R/G PULM:  CTAB GI: soft, bsx4 active  Extremities: warm/dry, no edema  Skin: no rashes or lesions   Assessment & Plan:    Hypertensive Emergency - Stopped home medications months ago for financial reasons Elevated Troponin and BNP - suspect 2/2 above. 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 Unfortunately likely progressed to ESRD AGMA - presumed 2/2 renal failure -Nephrology following and they agree that will need dialysis. Unfortunately likely at point of ESRD/dialysis dependence now - Continue HCO3 -Follow BMP  Acute Hypoxic Respiratory Failure - Currently requiring 6L [place], none at baseline. Afebrile but WBC and PCT mildly up. ? Aspiration PNA - Will start course of Ceftriaxone  Elevated Lipase - Abdomen non-tender and no ETOH use, low suspicion  for acute pancreatitis currently -continue to monitor for nausea, vomiting, abdominal pain    Best Practice (right click and "Reselect all SmartList Selections" daily)   Diet/type: Regular consistency (see orders) DVT prophylaxis: prophylactic heparin  GI prophylaxis: N/A Lines: Dialysis Catheter - TDC pending Foley:  N/A Code Status:  full code Last date of multidisciplinary goals of care discussion [pending, confirms full code 6/7, does not want  any family updated]  Critical care time: 30 minutes    Montey Hora, [place] For pager details, please see AMION or use Epic chat  After 1900, please call [place] for cross coverage needs 01/24/2022, 9:18 AM

## 2022-01-24 NOTE — Progress Notes (Signed)
eLink Physician-Brief Progress Note Patient Name: Tad Fancher DOB: [birth year] MRN: [id number]   Date of Service  01/24/2022  HPI/Events of Note  Nausea and dry heaves - QTc interval = 0.45 seconds.   eICU Interventions  Plan: Zofran 4 mg iV Q 6 hours PRN N/V.     Intervention Category Major Interventions: Other:  Lysle Dingwall 01/24/2022, 5:13 AM

## 2022-01-24 NOTE — H&P (Signed)
Chief Complaint: Patient was seen in consultation today for ESRD at the request of Dr. Marval Regal  Supervising Physician: Arne Cleveland  Patient Status: [facility] - In-pt  History of Present Illness: Tanner Rollins is a 71 y.o. 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  Dialysis is now needed.  IR consulted for placement of tunneled dialysis catheter.  Past Medical History:  Diagnosis Date   CKD (chronic kidney disease) stage 4, GFR 15-29 ml/min (HCC)    Hypertension     History reviewed. No pertinent surgical history.  Allergies: Patient has no known allergies.  Medications: Prior to Admission medications   Medication Sig Start Date End Date Taking? Authorizing Provider  aspirin EC 81 MG EC tablet Take 1 tablet (81 mg total) by mouth daily. Swallow whole. Patient not taking: Reported on 01/23/2022 03/13/21   Atway, Rayann N, DO  atorvastatin (LIPITOR) 40 MG tablet Take 1 tablet (40 mg total) by mouth daily. Patient not taking: Reported on 01/23/2022 03/12/21 04/11/21  Atway, Jeananne Rama, DO  chlorthalidone (HYGROTON) 25 MG tablet Take 0.5 tablets (12.5 mg total) by mouth daily. Patient not taking: Reported on 01/23/2022 03/12/21 04/11/21  Dorethea Clan, DO     History reviewed. No pertinent family history.  Social History   Socioeconomic History   Marital status: Single    Spouse name: Not on file   Number of children: Not on file   Years of education: Not on file   Highest education level: Not on file  Occupational History   Not on file  Tobacco Use   Smoking status: Some Days    Types: Cigarettes   Smokeless tobacco: Not on file  Substance and Sexual Activity   Alcohol use: Not on file   Drug use: Not on file   Sexual activity: Not on file  Other Topics Concern   Not on file  Social History Narrative   Not on file   Social  Determinants of Health   Financial Resource Strain: Not on file  Food Insecurity: Not on file  Transportation Needs: Not on file  Physical Activity: Not on file  Stress: Not on file  Social Connections: Not on file    Review of Symptoms: nausea, unable to obtain additional ROS as patient would like to rest.  Vital Signs: BP 127/71   Pulse 72   Temp 97.9 F (36.6 C) (Oral)   Resp 16   Ht 5' 11.5" (1.816 m)   Wt 159 lb 9.8 oz (72.4 kg)   SpO2 94%   BMI 21.95 kg/m   Physical Exam Vitals reviewed.  HENT:     Head: Normocephalic.  Cardiovascular:     Rate and Rhythm: Normal rate.  Pulmonary:     Effort: Pulmonary effort is normal.     Comments: Some crackles bilaterally Musculoskeletal:     Cervical back: Normal range of motion.  Skin:    General: Skin is warm and dry.  Neurological:     General: No focal deficit present.     Comments: Oriented, but sleepy     Imaging: ECHOCARDIOGRAM COMPLETE  Result Date: 01/24/2022    ECHOCARDIOGRAM REPORT   Patient Name:   Tanner Rollins Date of Exam: 01/24/2022 Medical Rec #:  [id number]       Height:       71.5 in Accession #:    [id number]  Weight:       159.6 lb Date of Birth:  [birth year]       BSA:          1.926 m Patient Age:    26 years        BP:           128/85 mmHg Patient Gender: M               HR:           68 bpm. Exam Location:  Inpatient Procedure: 2D Echo, Cardiac Doppler, Color Doppler, Strain Analysis and 3D Echo                     STAT ECHO Reported to: Dr. Oval Linsey on 01/24/2022 8:35:00 AM. Indications:    Chest pain  History:        Patient has no prior history of Echocardiogram examinations.                 Risk Factors:Hypertension.  Sonographer:    Luisa Hart RDCS Referring Phys: [id number] [place]  1. Left ventricular ejection fraction, by estimation, is 55 to 60%. The left ventricle has normal function. The left ventricle has no regional wall motion abnormalities. There is moderate concentric  left ventricular hypertrophy. Left ventricular diastolic parameters are consistent with Grade I diastolic dysfunction (impaired relaxation). Elevated left ventricular end-diastolic pressure.  2. Right ventricular systolic function is normal. The right ventricular size is normal. There is moderately elevated pulmonary artery systolic pressure.  3. Left atrial size was severely dilated.  4. Right atrial size was mildly dilated.  5. The mitral valve is normal in structure. Trivial mitral valve regurgitation. No evidence of mitral stenosis.  6. The aortic valve is tricuspid. Aortic valve regurgitation is not visualized. No aortic stenosis is present.  7. The inferior vena cava is dilated in size with <50% respiratory variability, suggesting right atrial pressure of 15 mmHg. FINDINGS  Left Ventricle: Left ventricular ejection fraction, by estimation, is 55 to 60%. The left ventricle has normal function. The left ventricle has no regional wall motion abnormalities. Global longitudinal strain performed but not reported based on interpreter judgement due to suboptimal tracking. The left ventricular internal cavity size was normal in size. There is moderate concentric left ventricular hypertrophy. Left ventricular diastolic parameters are consistent with Grade I diastolic dysfunction (impaired relaxation). Elevated left ventricular end-diastolic pressure. Right Ventricle: The right ventricular size is normal. No increase in right ventricular wall thickness. Right ventricular systolic function is normal. There is moderately elevated pulmonary artery systolic pressure. The tricuspid regurgitant velocity is 2.90 m/s, and with an assumed right atrial pressure of 15 mmHg, the estimated right ventricular systolic pressure is 54.6 mmHg. Left Atrium: Left atrial size was severely dilated. Right Atrium: Right atrial size was mildly dilated. Pericardium: There is no evidence of pericardial effusion. Mitral Valve: The mitral valve is  normal in structure. Trivial mitral valve regurgitation. No evidence of mitral valve stenosis. MV peak gradient, 4.0 mmHg. The mean mitral valve gradient is 1.0 mmHg. Tricuspid Valve: The tricuspid valve is normal in structure. Tricuspid valve regurgitation is trivial. No evidence of tricuspid stenosis. Aortic Valve: The aortic valve is tricuspid. Aortic valve regurgitation is not visualized. No aortic stenosis is present. Aortic valve mean gradient measures 6.0 mmHg. Aortic valve peak gradient measures 11.8 mmHg. Aortic valve area, by VTI measures 3.65  cm. Pulmonic Valve: The pulmonic valve was normal in structure. Pulmonic  valve regurgitation is not visualized. No evidence of pulmonic stenosis. Aorta: The aortic root is normal in size and structure. Venous: The inferior vena cava is dilated in size with less than 50% respiratory variability, suggesting right atrial pressure of 15 mmHg. IAS/Shunts: No atrial level shunt detected by color flow Doppler.  LEFT VENTRICLE PLAX 2D LVIDd:         4.10 cm     Diastology LVIDs:         3.10 cm     LV e' medial:    4.79 cm/s LV PW:         1.50 cm     LV E/e' medial:  14.8 LV IVS:        1.50 cm     LV e' lateral:   7.04 cm/s LVOT diam:     2.20 cm     LV E/e' lateral: 10.1 LV SV:         120 LV SV Index:   62          2D Longitudinal Strain LVOT Area:     3.80 cm    2D Strain GLS (A2C):   -10.4 %                            2D Strain GLS (A3C):   -17.3 %                            2D Strain GLS (A4C):   -14.4 % LV Volumes (MOD)           2D Strain GLS Avg:     -14.0 % LV vol d, MOD A2C: 92.9 ml LV vol d, MOD A4C: 88.4 ml LV vol s, MOD A2C: 25.7 ml LV vol s, MOD A4C: 46.7 ml LV SV MOD A2C:     67.2 ml LV SV MOD A4C:     88.4 ml LV SV MOD BP:      54.4 ml RIGHT VENTRICLE RV Basal diam:  4.10 cm RV Mid diam:    1.70 cm RV S prime:     20.60 cm/s TAPSE (M-mode): 2.7 cm LEFT ATRIUM              Index        RIGHT ATRIUM           Index LA Vol (A2C):   104.0 ml 54.00 ml/m  RA  Area:     19.30 cm LA Vol (A4C):   118.0 ml 61.27 ml/m  RA Volume:   56.90 ml  29.55 ml/m LA Biplane Vol: 113.0 ml 58.68 ml/m  AORTIC VALVE                     PULMONIC VALVE AV Area (Vmax):    3.67 cm      PV Vmax:       1.11 m/s AV Area (Vmean):   3.61 cm      PV Vmean:      80.100 cm/s AV Area (VTI):     3.65 cm      PV VTI:        0.235 m AV Vmax:           172.00 cm/s   PV Peak grad:  4.9 mmHg AV Vmean:          113.675 cm/s  PV Mean grad:  3.0  mmHg AV VTI:            0.328 m AV Peak Grad:      11.8 mmHg AV Mean Grad:      6.0 mmHg LVOT Vmax:         166.00 cm/s LVOT Vmean:        108.000 cm/s LVOT VTI:          0.314 m LVOT/AV VTI ratio: 0.96  AORTA Ao Root diam: 3.40 cm Ao Asc diam:  3.50 cm MITRAL VALVE               TRICUSPID VALVE MV Area (PHT): 2.56 cm    TR Peak grad:   33.6 mmHg MV Area VTI:   3.90 cm    TR Vmax:        290.00 cm/s MV Peak grad:  4.0 mmHg MV Mean grad:  1.0 mmHg    SHUNTS MV Vmax:       1.00 m/s    Systemic VTI:  0.31 m MV Vmean:      54.0 cm/s   Systemic Diam: 2.20 cm MV Decel Time: 296 msec MV E velocity: 71.00 cm/s MV A velocity: 97.30 cm/s MV E/A ratio:  0.73 Skeet Latch MD Electronically signed by Skeet Latch MD Signature Date/Time: 01/24/2022/11:14:58 AM    Final    US RENAL  Result Date: 01/23/2022 CLINICAL DATA:  Acute on chronic renal failure. EXAM: RENAL / URINARY TRACT ULTRASOUND COMPLETE COMPARISON:  CT abdomen and pelvis 02/09/2021. Renal ultrasound 03/12/2021. FINDINGS: Right Kidney: Renal measurements: 9.1 x 4.1 x 3.8 cm = volume: 73 mL. Echogenicity is increased. No hydronephrosis. There is a 3.7 x 2.8 x 3.8 cm cyst in the superior pole the right kidney which has mildly increased in size. Left Kidney: Renal measurements: 7.8 x 5.6 x 4.0 cm = volume: 92 mL. Echogenicity is increased. No mass or hydronephrosis visualized. Bladder: Appears normal for degree of bladder distention. Other: Right pleural effusion present. IMPRESSION: 1. Echogenic kidneys  likely related to medical renal disease, similar to prior study. 2. No hydronephrosis. 3. Right renal cyst has mildly increased in size. 4. Right pleural effusion. Electronically Signed   By: Ronney Asters M.D.   On: 01/23/2022 23:37   DG Chest Portable 1 View  Result Date: 01/23/2022 CLINICAL DATA:  Chest pain. EXAM: PORTABLE CHEST 1 VIEW COMPARISON:  Chest x-ray 11/17/2011. CT abdomen and pelvis 03/11/2021. FINDINGS: There is patchy airspace disease in the bilateral lower lobes. There is likely a small left pleural effusion. Cardiac silhouette is enlarged, a new finding. No evidence for pneumothorax. No acute fractures. IMPRESSION: 1. New enlarged cardiac silhouette may represent cardiomegaly and or pericardial effusion. 2. Bilateral lower lobe airspace disease worrisome for infection. 3. Small left pleural effusion. Electronically Signed   By: Ronney Asters M.D.   On: 01/23/2022 17:11    Labs:  CBC: Recent Labs    03/12/21 0635 01/23/22 1515 01/23/22 1527 01/23/22 1548 01/23/22 2147 01/24/22 0017 01/24/22 0611  WBC 5.4 10.0  --   --  12.6* 12.5*  --   HGB 10.1* 8.0*   < > 7.8* 6.4* 6.9* 7.1*  HCT 31.3* 24.2*   < > 23.0* 19.4* 20.3* 21.0*  PLT 267 132*  --   --  122* 119*  --    < > = values in this interval not displayed.    COAGS: No results for input(s): "INR", "APTT" in the last 8760 hours.  BMP: Recent Labs  01/23/22 1515 01/23/22 1527 01/23/22 1850 01/23/22 2147 01/24/22 0017 01/24/22 0611  NA 137   < > 137 140 138 135  K 5.0   < > 5.7* 6.4* 4.5 5.1  CL 108  --  108 106 106  --   CO2 9*  --  8* 15* 12*  --   GLUCOSE 135*  --  128* 160* 172*  --   BUN 140*  --  150* 149* 145*  --   CALCIUM 8.9  --  8.9 8.3* 8.8*  --   CREATININE 26.11*  --  27.02* 24.77* 24.40*  --   GFRNONAA 2*  --  2* 2* 2*  --    < > = values in this interval not displayed.    LIVER FUNCTION TESTS: Recent Labs    03/10/21 1706 03/12/21 0635 01/23/22 1515 01/23/22 1850  BILITOT 0.6   --  0.9  --   AST 22  --  24  --   ALT 16  --  14  --   ALKPHOS 91  --  81  --   PROT 7.6  --  6.2*  --   ALBUMIN 3.9 2.9* 3.0* 2.9*    Assessment and Plan:  CKD stage IV --in need of dialysis --will plan to place tunneled dialysis catheter today. --is NPO, except for meds  Risks and benefits discussed with the patient including, but not limited to bleeding, infection, vascular injury, pneumothorax which may require chest tube placement, air embolism or even death  All of the patient's questions were answered, patient is agreeable to proceed. Consent signed and in chart.   Thank you for this interesting consult.  I greatly enjoyed meeting [place] and look forward to participating in their care.  A copy of this report was sent to the requesting provider on this date.  Electronically Signed: Pasty Spillers, PA 01/24/2022, 11:43 AM   I spent a total of 20 Minutes in face to face in clinical consultation, greater than 50% of which was counseling/coordinating care for HD catheter.

## 2022-01-24 NOTE — Progress Notes (Addendum)
Patient ID: Tanner Rollins, male   DOB: 1951/03/22, 71 y.o.   MRN: [id number] S: Feeling better and chest pain has resolved. O:BP 135/79   Pulse 67   Temp 97.9 F (36.6 C) (Oral)   Resp 16   Ht 5' 11.5" (1.816 m)   Wt 72.4 kg   SpO2 94%   BMI 21.95 kg/m   Intake/Output Summary (Last 24 hours) at 01/24/2022 0944 Last data filed at 01/24/2022 0445 Gross per 24 hour  Intake 2619.07 ml  Output --  Net 2619.07 ml   Intake/Output: I/O last 3 completed shifts: In: 2619.1 [P.O.:840; I.V.:1025.4; Blood:656.7; IV Piggyback:97] Out: -   Intake/Output this shift:  No intake/output data recorded. Weight change:  Gen:NAD CVS:RRR Resp:CTA Abd:+BS, soft, NT/ND Ext: no edema  Recent Labs  Lab 01/23/22 1515 01/23/22 1527 01/23/22 1548 01/23/22 1850 01/23/22 2147 01/24/22 0017 01/24/22 0611  NA 137 136 136 137 140 138 135  K 5.0 4.9 4.9 5.7* 6.4* 4.5 5.1  CL 108  --   --  108 106 106  --   CO2 9*  --   --  8* 15* 12*  --   GLUCOSE 135*  --   --  128* 160* 172*  --   BUN 140*  --   --  150* 149* 145*  --   CREATININE 26.11*  --   --  27.02* 24.77* 24.40*  --   ALBUMIN 3.0*  --   --  2.9*  --   --   --   CALCIUM 8.9  --   --  8.9 8.3* 8.8*  --   PHOS  --   --   --  9.0*  --  9.1*  --   AST 24  --   --   --   --   --   --   ALT 14  --   --   --   --   --   --    Liver Function Tests: Recent Labs  Lab 01/23/22 1515 01/23/22 1850  AST 24  --   ALT 14  --   ALKPHOS 81  --   BILITOT 0.9  --   PROT 6.2*  --   ALBUMIN 3.0* 2.9*   Recent Labs  Lab 01/23/22 1515  LIPASE 210*   No results for input(s): "AMMONIA" in the last 168 hours. CBC: Recent Labs  Lab 01/23/22 1515 01/23/22 1527 01/23/22 2147 01/24/22 0017 01/24/22 0611  WBC 10.0  --  12.6* 12.5*  --   NEUTROABS 8.0*  --   --   --   --   HGB 8.0*   < > 6.4* 6.9* 7.1*  HCT 24.2*   < > 19.4* 20.3* 21.0*  MCV 85.2  --  82.9 81.9  --   PLT 132*  --  122* 119*  --    < > = values in this interval not displayed.    Cardiac Enzymes: No results for input(s): "CKTOTAL", "CKMB", "CKMBINDEX", "TROPONINI" in the last 168 hours. CBG: Recent Labs  Lab 01/23/22 1912 01/24/22 0421  GLUCAP 115* 161*    Iron Studies:  Recent Labs    01/24/22 0017  IRON 37*  TIBC 196*  FERRITIN 654*   Studies/Results: US RENAL  Result Date: 01/23/2022 CLINICAL DATA:  Acute on chronic renal failure. EXAM: RENAL / URINARY TRACT ULTRASOUND COMPLETE COMPARISON:  CT abdomen and pelvis 02/09/2021. Renal ultrasound 03/12/2021. FINDINGS: Right Kidney: Renal measurements: 9.1 x 4.1 x 3.8  cm = volume: 73 mL. Echogenicity is increased. No hydronephrosis. There is a 3.7 x 2.8 x 3.8 cm cyst in the superior pole the right kidney which has mildly increased in size. Left Kidney: Renal measurements: 7.8 x 5.6 x 4.0 cm = volume: 92 mL. Echogenicity is increased. No mass or hydronephrosis visualized. Bladder: Appears normal for degree of bladder distention. Other: Right pleural effusion present. IMPRESSION: 1. Echogenic kidneys likely related to medical renal disease, similar to prior study. 2. No hydronephrosis. 3. Right renal cyst has mildly increased in size. 4. Right pleural effusion. Electronically Signed   By: Ronney Asters M.D.   On: 01/23/2022 23:37   DG Chest Portable 1 View  Result Date: 01/23/2022 CLINICAL DATA:  Chest pain. EXAM: PORTABLE CHEST 1 VIEW COMPARISON:  Chest x-ray 11/17/2011. CT abdomen and pelvis 03/11/2021. FINDINGS: There is patchy airspace disease in the bilateral lower lobes. There is likely a small left pleural effusion. Cardiac silhouette is enlarged, a new finding. No evidence for pneumothorax. No acute fractures. IMPRESSION: 1. New enlarged cardiac silhouette may represent cardiomegaly and or pericardial effusion. 2. Bilateral lower lobe airspace disease worrisome for infection. 3. Small left pleural effusion. Electronically Signed   By: Ronney Asters M.D.   On: 01/23/2022 17:11    sodium chloride   Intravenous  Once   amLODipine  10 mg Oral Daily   aspirin EC  81 mg Oral Daily   Chlorhexidine Gluconate Cloth  6 each Topical Daily   Chlorhexidine Gluconate Cloth  6 each Topical Q0600   heparin injection (subcutaneous)  5,000 Units Subcutaneous Q8H   hydrALAZINE  50 mg Oral Q6H   LORazepam  0.5 mg Sublingual Once   mouth rinse  15 mL Mouth Rinse BID   sodium bicarbonate  1,300 mg Oral TID    BMET    Component Value Date/Time   NA 135 01/24/2022 0611   K 5.1 01/24/2022 0611   CL 106 01/24/2022 0017   CO2 12 (L) 01/24/2022 0017   GLUCOSE 172 (H) 01/24/2022 0017   BUN 145 (H) 01/24/2022 0017   CREATININE 24.40 (H) 01/24/2022 0017   CALCIUM 8.8 (L) 01/24/2022 0017   GFRNONAA 2 (L) 01/24/2022 0017   CBC    Component Value Date/Time   WBC 12.5 (H) 01/24/2022 0017   RBC 2.48 (L) 01/24/2022 0017   HGB 7.1 (L) 01/24/2022 0611   HCT 21.0 (L) 01/24/2022 0611   PLT 119 (L) 01/24/2022 0017   MCV 81.9 01/24/2022 0017   MCH 27.8 01/24/2022 0017   MCHC 34.0 01/24/2022 0017   RDW 16.7 (H) 01/24/2022 0017   LYMPHSABS 1.1 01/23/2022 1515   MONOABS 0.8 01/23/2022 1515   EOSABS 0.0 01/23/2022 1515   BASOSABS 0.0 01/23/2022 1515     Assessment/Plan:  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  We discussed the severity and chronicity of his CKD and he has a strong family history of ESRD.  Renal US consistent with chronic medical renal disease.  No obstruction or hydro.  He is amenable to start HD after tunneled HC catheter is placed. Hypertensive emergency - markedly improved with Cardene gtt.  Appreciate PCCM's assistance. Hyperkalemia - improved with lasix and lokelma. Acute hypoxic respiratory failure - wean supplemental oxygen.  CXR with bilateral lower lobe airspace disease worrisome for infection.  Small left pleural effusion.  Antibiotics per PCCM.  Anemia of CKD stage V - iron 37, TSAT 19%, ferritin  654.  S/p blood transfusion.  Will start IV  iron and ESA and follow. 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  iPTH pending but with low calcium, he will likely require vit D. Vascular access- IR consulted for [facility].  Will also need to consult VVS for AVF/AVG.  Donetta Potts, MD [facility]

## 2022-01-25 ENCOUNTER — Inpatient Hospital Stay (HOSPITAL_COMMUNITY): Payer: Medicare Other

## 2022-01-25 DIAGNOSIS — N185 Chronic kidney disease, stage 5: Secondary | ICD-10-CM

## 2022-01-25 DIAGNOSIS — R0789 Other chest pain: Secondary | ICD-10-CM

## 2022-01-25 DIAGNOSIS — Z01810 Encounter for preprocedural cardiovascular examination: Secondary | ICD-10-CM

## 2022-01-25 DIAGNOSIS — I5032 Chronic diastolic (congestive) heart failure: Secondary | ICD-10-CM

## 2022-01-25 DIAGNOSIS — I10 Essential (primary) hypertension: Secondary | ICD-10-CM

## 2022-01-25 DIAGNOSIS — E8729 Other acidosis: Secondary | ICD-10-CM

## 2022-01-25 LAB — TYPE AND SCREEN
ABO/RH(D): A POS
Antibody Screen: NEGATIVE
Unit division: 0

## 2022-01-25 LAB — CBC
HCT: 22 % — ABNORMAL LOW (ref 39.0–52.0)
Hemoglobin: 7.8 g/dL — ABNORMAL LOW (ref 13.0–17.0)
MCH: 29.1 pg (ref 26.0–34.0)
MCHC: 35.5 g/dL (ref 30.0–36.0)
MCV: 82.1 fL (ref 80.0–100.0)
Platelets: 136 10*3/uL — ABNORMAL LOW (ref 150–400)
RBC: 2.68 MIL/uL — ABNORMAL LOW (ref 4.22–5.81)
RDW: 16.6 % — ABNORMAL HIGH (ref 11.5–15.5)
WBC: 13.8 10*3/uL — ABNORMAL HIGH (ref 4.0–10.5)
nRBC: 0 % (ref 0.0–0.2)

## 2022-01-25 LAB — MRSA NEXT GEN BY PCR, NASAL: MRSA by PCR Next Gen: NOT DETECTED

## 2022-01-25 LAB — BASIC METABOLIC PANEL
Anion gap: 18 — ABNORMAL HIGH (ref 5–15)
BUN: 83 mg/dL — ABNORMAL HIGH (ref 8–23)
CO2: 19 mmol/L — ABNORMAL LOW (ref 22–32)
Calcium: 8.3 mg/dL — ABNORMAL LOW (ref 8.9–10.3)
Chloride: 98 mmol/L (ref 98–111)
Creatinine, Ser: 14.28 mg/dL — ABNORMAL HIGH (ref 0.61–1.24)
GFR, Estimated: 3 mL/min — ABNORMAL LOW (ref >60)
Glucose, Bld: 97 mg/dL (ref 70–99)
Potassium: 4 mmol/L (ref 3.5–5.1)
Sodium: 135 mmol/L (ref 135–145)

## 2022-01-25 LAB — PARATHYROID HORMONE, INTACT (NO CA): PTH: 447 pg/mL — ABNORMAL HIGH (ref 15–65)

## 2022-01-25 LAB — BPAM RBC
Blood Product Expiration Date: 202307022359
ISSUE DATE / TIME: 202306080202
Unit Type and Rh: 6200

## 2022-01-25 LAB — PHOSPHORUS: Phosphorus: 6.3 mg/dL — ABNORMAL HIGH (ref 2.5–4.6)

## 2022-01-25 LAB — HEPATITIS B SURFACE ANTIBODY, QUANTITATIVE: Hep B S AB Quant (Post): <3.1 m[IU]/mL — ABNORMAL LOW (ref >9.9)

## 2022-01-25 LAB — MAGNESIUM: Magnesium: 1.7 mg/dL (ref 1.7–2.4)

## 2022-01-25 MED ORDER — SODIUM CHLORIDE 0.9% FLUSH: 10.0000 mL | INTRAVENOUS | Status: DC | PRN

## 2022-01-25 MED ORDER — CARVEDILOL 6.25 MG PO TABS
6.2500 mg | ORAL_TABLET | Freq: Two times a day (BID) | ORAL | Status: DC
Start: 2022-01-25 — End: 2022-01-26
  Administered 2022-01-25 – 2022-01-26 (×2): 6.25 mg via ORAL
  Filled 2022-01-25: qty 1

## 2022-01-25 MED ORDER — SODIUM CHLORIDE 0.9% FLUSH
10.0000 mL | Freq: Two times a day (BID) | INTRAVENOUS | Status: DC
  Administered 2022-01-25 – 2022-01-31 (×7): 10 mL

## 2022-01-25 MED ORDER — ANTICOAGULANT SODIUM CITRATE 4% (200MG/5ML) IV SOLN
5.0000 mL | Status: DC | PRN
  Filled 2022-01-25 (×2): qty 5

## 2022-01-25 NOTE — Progress Notes (Signed)
NAME:  Alban Marucci, MRN:  [id number], DOB:  October 15, 1950, LOS: 2 ADMISSION DATE:  01/23/2022, CONSULTATION DATE:  01/25/22 REFERRING MD:  EDP, CHIEF COMPLAINT:  chest pain   History of Present Illness:  Jaythan Hinely is a 71 y.o. M with PMH of HTN and CKD  who presents with worsening chest pain.  He reports financial difficulty obtaining his medications so stopped taking them approximately two months ago.  He reports intermittent chest pain over the last two weeks with sudden worsening yesterday; the pain is described as sharp and pressure-like, mid-sternal, does not radiate and is constant.   Has been eating and drinking less over the last couple of days as well, denies nausea and vomiting, headache or vision changes.    Pt denies significant ETOH use or drug use besides occasional marijuana  He was given Asa and nitro by EMS and had an initial BP of 228/130 in the ED.  He was started on a nitro gtt and then transitioned to cardene.   Labs significant for BNP 2684, troponin 323, lipase 210 and creatinine of 26 up from baseline of 4-5.  He is still complaining of chest pain at the time of admission  Pertinent  Medical History   has a past medical history of CKD (chronic kidney disease) stage 4, GFR 15-29 ml/min (HCC) and Hypertension.   Significant Hospital Events: Including procedures, antibiotic start and stop dates in addition to other pertinent events   6/7 admit to ICU with hypertensive emergency and acute on chronic renal failure  Interim History / Subjective:  Off Cardene since overnight. Received HD overnight, no issues. No volume removed. Remains on 10L O2 via [place]. Breathing a little better but still not normal.  Objective   Blood pressure (!) 145/84, pulse 97, temperature 98.6 F (37 C), temperature source Oral, resp. rate (!) 27, height 5' 11.5" (1.816 m), weight 72.4 kg, SpO2 91 %.        Intake/Output Summary (Last 24 hours) at 01/25/2022 0756 Last data filed at 01/25/2022  0650 Gross per 24 hour  Intake 3214.46 ml  Output 150 ml  Net 3064.46 ml    Filed Weights   01/23/22 1557 01/23/22 1824 01/24/22 0455  Weight: 68 kg 70.1 kg 72.4 kg    General:  Adult male, resting in bed, in NAD HEENT: MM pink/dry, sclera anicteric, EOMI Neuro: A&O x 3, no deficits CV: RRR, no M/R/G PULM:  CTAB GI: soft, bsx4 active  Extremities: warm/dry, no edema  Skin: no rashes or lesions   Assessment & Plan:    Hypertensive Emergency - Stopped home medications months ago for financial reasons.  BP improved and off Cardene since PM 6/8. New mild dCHF - echo with EF 55-60%, G1DD. Elevated Troponin and BNP - suspect 2/2 above. EKG not suggestive of acute ischemia. - Continue Amlodipine, ASA, Hydralazine. - Needs to re-establish with PCP and continue lifelong antihypertensive regimen. - DASH diet, exercise.  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 Unfortunately likely progressed to ESRD and is now s/p 1st session HD 6/8. AGMA - presumed 2/2 renal failure, improving with HD. - Nephrology following, appreciate the assistance. - Needs some volume removal. - Stop HCO3 infusion. - Follow BMP.  Acute Hypoxic Respiratory Failure with probable aspiration PNA- Currently requiring 10L [place], none at baseline. Afebrile but WBC and PCT mildly up. - Continue empiric Ceftriaxone for now. - Wean as able. - Mobilize and push pulmonary hygiene.  Elevated  Lipase - Abdomen non-tender and no ETOH use, low suspicion for acute pancreatitis currently.  - Continue to monitor for nausea, vomiting, abdominal pain.  Maintain in ICU given high O2 needs currently. Needs some volume removal and anticipate this will help O2 needs. Will ask PT to evaluate and mobilize.    Best Practice (right click and "Reselect all SmartList Selections" daily)   Diet/type: Regular consistency (see orders) DVT prophylaxis: prophylactic heparin  GI prophylaxis:  N/A Lines: Dialysis Catheter - TDC pending Foley:  N/A Code Status:  full code Last date of multidisciplinary goals of care discussion [pending, confirms full code 6/7, does not want any family updated]  Critical care time: 30 minutes    Montey Hora, [place] For pager details, please see AMION or use Epic chat  After 1900, please call [place] for cross coverage needs 01/25/2022, 7:56 AM

## 2022-01-25 NOTE — TOC Initial Note (Signed)
Transition of Care [facility]) - Initial/Assessment Note    Patient Details  Name: Tanner Rollins MRN: [id number] Date of Birth: August 23, 1950  Transition of Care [facility]) CM/SW Contact:    Tom-Johnson, Renea Ee, RN Phone Number: 01/25/2022, 2:18 PM  Clinical Narrative:                  CM spoke with patient at bedside about needs for post hospital transition. Admitted for Hypertensive crisis and found to be in AKI on CKD. Started inpatient dialysis yesterday 06/08. Has a  right IJ TDC placed by IR. Vein mapping pending. Plan for left UE AV fistula. From home alone. Patient is a retired English as a second language teacher. Has two children and a sister that are supportive with care. Independent with care and drive self prior to admission. Does not have any DME's at home. Currently on 12L HFNC, does not have home O2.  Patient states he is enrolled with the [place] in [place] and gets his medications from them. States he ran out of his medications and did not call or notify the [place].  [facility] and spoke with Fanny Skates F. Notification ID # [id number]. There is no PCP listed in Epic. CM called and spoke with Rip Harbour [phone] ex [phone]) at [facility] eligibility dept. Rip Harbour states patient has not been seen since 2022 and needs to register for eligibility. CM connected patient with Rip Harbour via CM's phone for consent to consult for PCP. Patient gave consent. Rip Harbour states she will place consult and someone will reach out to patient .  Awaiting PT/OT eval for recommendations and needs. CM will continue to follow with needs.    Barriers to Discharge: Continued Medical Work up   Patient Goals and CMS Choice Patient states their goals for this hospitalization and ongoing recovery are:: To return home CMS Medicare.gov Compare Post Acute Care list provided to:: Patient    Expected Discharge Plan and Services     Discharge Planning Services: CM Consult   Living arrangements for the  past 2 months: Apartment                                      Prior Living Arrangements/Services Living arrangements for the past 2 months: Apartment Lives with:: Self Patient language and need for interpreter reviewed:: Yes Do you feel safe going back to the place where you live?: Yes      Need for Family Participation in Patient Care: Yes (Comment) Care giver support system in place?: Yes (comment)   Criminal Activity/Legal Involvement Pertinent to Current Situation/Hospitalization: No - Comment as needed  Activities of Daily Living Home Assistive Devices/Equipment: None ADL Screening (condition at time of admission) Patient's cognitive ability adequate to safely complete daily activities?: Yes Is the patient deaf or have difficulty hearing?: No Does the patient have difficulty seeing, even when wearing glasses/contacts?: No Does the patient have difficulty concentrating, remembering, or making decisions?: No Patient able to express need for assistance with ADLs?: No Does the patient have difficulty dressing or bathing?: No Independently performs ADLs?: Yes (appropriate for developmental age) Does the patient have difficulty walking or climbing stairs?: No Weakness of Legs: None Weakness of Arms/Hands: None  Permission Sought/Granted Permission sought to share information with : Case Manager, Family Supports, Chartered certified accountant granted to share information with : Yes, Verbal Permission Granted  Emotional Assessment Appearance:: Appears stated age Attitude/Demeanor/Rapport: Gracious, Engaged Affect (typically observed): Accepting, Appropriate, Calm, Hopeful Orientation: : Oriented to Self, Oriented to Place, Oriented to  Time, Oriented to Situation Alcohol / Substance Use: Not Applicable Psych Involvement: No (comment)  Admission diagnosis:  Hypertensive emergency [I16.1] Chest pain, unspecified type [R07.9] Patient Active  Problem List   Diagnosis Date Noted   High anion gap metabolic acidosis    Hypertensive emergency 01/23/2022   Chest pain    AKI (acute kidney injury) ([place])    CKD (chronic kidney disease) stage 4, GFR 15-29 ml/min ([place]) 03/12/2021   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

## 2022-01-25 NOTE — Progress Notes (Signed)
Patient ID: Izreal Kock, male   DOB: Mar 09, 1951, 71 y.o.   MRN: [id number] S: Tolerated dialysis well but didn't finish until 1am. O:BP (!) 158/88   Pulse 85   Temp 98.6 F (37 C) (Oral)   Resp (!) 38   Ht 5' 11.5" (1.816 m)   Wt 72.4 kg   SpO2 94%   BMI 21.95 kg/m   Intake/Output Summary (Last 24 hours) at 01/25/2022 1148 Last data filed at 01/25/2022 1100 Gross per 24 hour  Intake 1918.98 ml  Output 150 ml  Net 1768.98 ml   Intake/Output: I/O last 3 completed shifts: In: 5759.5 [P.O.:1000; I.V.:2654.4; Blood:656.7; IV Piggyback:1448.4] Out: 150 [Urine:150]  Intake/Output this shift:  Total I/O In: 130 [P.O.:120; I.V.:10] Out: -  Weight change:  Gen:ill-appearing, NAD, fatigued CVS: RRR Resp: Decreased BS at bases Abd:+BS, soft, NT/ND Ext:No edema  Recent Labs  Lab 01/23/22 1515 01/23/22 1527 01/23/22 1548 01/23/22 1850 01/23/22 2147 01/24/22 0017 01/24/22 0611 01/25/22 0122  NA 137 136 136 137 140 138 135 135  K 5.0 4.9 4.9 5.7* 6.4* 4.5 5.1 4.0  CL 108  --   --  108 106 106  --  98  CO2 9*  --   --  8* 15* 12*  --  19*  GLUCOSE 135*  --   --  128* 160* 172*  --  97  BUN 140*  --   --  150* 149* 145*  --  83*  CREATININE 26.11*  --   --  27.02* 24.77* 24.40*  --  14.28*  ALBUMIN 3.0*  --   --  2.9*  --   --   --   --   CALCIUM 8.9  --   --  8.9 8.3* 8.8*  --  8.3*  PHOS  --   --   --  9.0*  --  9.1*  --  6.3*  AST 24  --   --   --   --   --   --   --   ALT 14  --   --   --   --   --   --   --    Liver Function Tests: Recent Labs  Lab 01/23/22 1515 01/23/22 1850  AST 24  --   ALT 14  --   ALKPHOS 81  --   BILITOT 0.9  --   PROT 6.2*  --   ALBUMIN 3.0* 2.9*   Recent Labs  Lab 01/23/22 1515  LIPASE 210*   No results for input(s): "AMMONIA" in the last 168 hours. CBC: Recent Labs  Lab 01/23/22 1515 01/23/22 1527 01/23/22 2147 01/24/22 0017 01/24/22 0611 01/25/22 0122  WBC 10.0  --  12.6* 12.5*  --  13.8*  NEUTROABS 8.0*  --   --   --    --   --   HGB 8.0*   < > 6.4* 6.9* 7.1* 7.8*  HCT 24.2*   < > 19.4* 20.3* 21.0* 22.0*  MCV 85.2  --  82.9 81.9  --  82.1  PLT 132*  --  122* 119*  --  136*   < > = values in this interval not displayed.   Cardiac Enzymes: No results for input(s): "CKTOTAL", "CKMB", "CKMBINDEX", "TROPONINI" in the last 168 hours. CBG: Recent Labs  Lab 01/23/22 1912 01/24/22 0421  GLUCAP 115* 161*    Iron Studies:  Recent Labs    01/24/22 0017  IRON 37*  TIBC 196*  FERRITIN  654*   Studies/Results: DG CHEST PORT 1 VIEW  Result Date: 01/25/2022 CLINICAL DATA:  Hypoxemia. EXAM: PORTABLE CHEST 1 VIEW COMPARISON:  Radiograph January 23, 2022 FINDINGS: Interval placement of a dual-lumen right approach central venous catheter with tip overlying the right atrium. No visible pneumothorax. The heart size and mediastinal contours are partially obscured but appear unchanged. Increase in now moderate right pleural effusion and stable small left pleural effusion. Opacities in the bilateral lung bases along the pleural effusions are similar prior may reflect atelectasis or consolidation. No acute osseous abnormality. IMPRESSION: Interval placement of a dual lumen right-sided central venous catheter without visible pneumothorax. Increase in a now moderate right pleural effusion with a stable small left pleural effusion and unchanged adjacent bilateral airspace opacities reflecting infiltrate or atelectasis. Electronically Signed   By: Dahlia Bailiff M.D.   On: 01/25/2022 08:20   IR Fluoro Guide CV Line Right  Result Date: 01/24/2022 CLINICAL DATA:  Renal failure, needs durable venous access for planned hemodialysis EXAM: TUNNELED HEMODIALYSIS CATHETER PLACEMENT WITH ULTRASOUND AND FLUOROSCOPIC GUIDANCE TECHNIQUE: The procedure, risks, benefits, and alternatives were explained to the patient. Questions regarding the procedure were encouraged and answered. The patient understands and consents to the procedure. As antibiotic  prophylaxis, cefazolin 2 g was ordered pre-procedure and administered intravenously within one hour of incision. Patency of the right IJ vein was confirmed with ultrasound with image documentation. An appropriate skin site was determined. Region was prepped using maximum barrier technique including cap and mask, sterile gown, sterile gloves, large sterile sheet, and Chlorhexidine as cutaneous antisepsis. The region was infiltrated locally with 1% lidocaine. Under real-time ultrasound guidance, the right IJ vein was accessed with a 21 gauge micropuncture needle; the needle tip within the vein was confirmed with ultrasound image documentation. Needle exchanged over the 018 guidewire for transitional dilator, which allowed advancement of a Benson wire into the IVC. Over this, an MPA catheter was advanced. A Palindrome 23 hemodialysis catheter was tunneled from the right anterior chest wall approach to the right IJ dermatotomy site. The MPA catheter was exchanged over an Amplatz wire for serial vascular dilators which allow placement of a peel-away sheath, through which the catheter was advanced under intermittent fluoroscopy, positioned with its tips in the proximal and midright atrium. Spot chest radiograph confirms good catheter position. No pneumothorax. Catheter was flushed and primed per protocol. Catheter secured externally with O Prolene sutures. The right IJ dermatotomy site was closed with Dermabond. COMPLICATIONS: COMPLICATIONS None immediate COMPARISON:  None Available. IMPRESSION: 1. Technically successful placement of tunneled right IJ hemodialysis catheter with ultrasound and fluoroscopic guidance. Ready for routine use. ACCESS: Remains approachable for percutaneous intervention as needed. Electronically Signed   By: Lucrezia Europe M.D.   On: 01/24/2022 15:55   IR US Guide Vasc Access Right  Result Date: 01/24/2022 CLINICAL DATA:  Renal failure, needs durable venous access for planned hemodialysis EXAM:  TUNNELED HEMODIALYSIS CATHETER PLACEMENT WITH ULTRASOUND AND FLUOROSCOPIC GUIDANCE TECHNIQUE: The procedure, risks, benefits, and alternatives were explained to the patient. Questions regarding the procedure were encouraged and answered. The patient understands and consents to the procedure. As antibiotic prophylaxis, cefazolin 2 g was ordered pre-procedure and administered intravenously within one hour of incision. Patency of the right IJ vein was confirmed with ultrasound with image documentation. An appropriate skin site was determined. Region was prepped using maximum barrier technique including cap and mask, sterile gown, sterile gloves, large sterile sheet, and Chlorhexidine as cutaneous antisepsis. The region was infiltrated locally  with 1% lidocaine. Under real-time ultrasound guidance, the right IJ vein was accessed with a 21 gauge micropuncture needle; the needle tip within the vein was confirmed with ultrasound image documentation. Needle exchanged over the 018 guidewire for transitional dilator, which allowed advancement of a Benson wire into the IVC. Over this, an MPA catheter was advanced. A Palindrome 23 hemodialysis catheter was tunneled from the right anterior chest wall approach to the right IJ dermatotomy site. The MPA catheter was exchanged over an Amplatz wire for serial vascular dilators which allow placement of a peel-away sheath, through which the catheter was advanced under intermittent fluoroscopy, positioned with its tips in the proximal and midright atrium. Spot chest radiograph confirms good catheter position. No pneumothorax. Catheter was flushed and primed per protocol. Catheter secured externally with O Prolene sutures. The right IJ dermatotomy site was closed with Dermabond. COMPLICATIONS: COMPLICATIONS None immediate COMPARISON:  None Available. IMPRESSION: 1. Technically successful placement of tunneled right IJ hemodialysis catheter with ultrasound and fluoroscopic guidance. Ready  for routine use. ACCESS: Remains approachable for percutaneous intervention as needed. Electronically Signed   By: Lucrezia Europe M.D.   On: 01/24/2022 15:55   ECHOCARDIOGRAM COMPLETE  Result Date: 01/24/2022    ECHOCARDIOGRAM REPORT   Patient Name:   QUINNTEN CALVIN Date of Exam: 01/24/2022 Medical Rec #:  [id number]       Height:       71.5 in Accession #:    [id number]      Weight:       159.6 lb Date of Birth:  Mar 10, 1951       BSA:          1.926 m Patient Age:    10 years        BP:           128/85 mmHg Patient Gender: M               HR:           68 bpm. Exam Location:  Inpatient Procedure: 2D Echo, Cardiac Doppler, Color Doppler, Strain Analysis and 3D Echo                     STAT ECHO Reported to: Dr. Oval Linsey on 01/24/2022 8:35:00 AM. Indications:    Chest pain  History:        Patient has no prior history of Echocardiogram examinations.                 Risk Factors:Hypertension.  Sonographer:    Luisa Hart RDCS Referring Phys: [id number] [place]  1. Left ventricular ejection fraction, by estimation, is 55 to 60%. The left ventricle has normal function. The left ventricle has no regional wall motion abnormalities. There is moderate concentric left ventricular hypertrophy. Left ventricular diastolic parameters are consistent with Grade I diastolic dysfunction (impaired relaxation). Elevated left ventricular end-diastolic pressure.  2. Right ventricular systolic function is normal. The right ventricular size is normal. There is moderately elevated pulmonary artery systolic pressure.  3. Left atrial size was severely dilated.  4. Right atrial size was mildly dilated.  5. The mitral valve is normal in structure. Trivial mitral valve regurgitation. No evidence of mitral stenosis.  6. The aortic valve is tricuspid. Aortic valve regurgitation is not visualized. No aortic stenosis is present.  7. The inferior vena cava is dilated in size with <50% respiratory variability, suggesting right atrial  pressure of 15 mmHg. FINDINGS  Left Ventricle: Left ventricular  ejection fraction, by estimation, is 55 to 60%. The left ventricle has normal function. The left ventricle has no regional wall motion abnormalities. Global longitudinal strain performed but not reported based on interpreter judgement due to suboptimal tracking. The left ventricular internal cavity size was normal in size. There is moderate concentric left ventricular hypertrophy. Left ventricular diastolic parameters are consistent with Grade I diastolic dysfunction (impaired relaxation). Elevated left ventricular end-diastolic pressure. Right Ventricle: The right ventricular size is normal. No increase in right ventricular wall thickness. Right ventricular systolic function is normal. There is moderately elevated pulmonary artery systolic pressure. The tricuspid regurgitant velocity is 2.90 m/s, and with an assumed right atrial pressure of 15 mmHg, the estimated right ventricular systolic pressure is 81.1 mmHg. Left Atrium: Left atrial size was severely dilated. Right Atrium: Right atrial size was mildly dilated. Pericardium: There is no evidence of pericardial effusion. Mitral Valve: The mitral valve is normal in structure. Trivial mitral valve regurgitation. No evidence of mitral valve stenosis. MV peak gradient, 4.0 mmHg. The mean mitral valve gradient is 1.0 mmHg. Tricuspid Valve: The tricuspid valve is normal in structure. Tricuspid valve regurgitation is trivial. No evidence of tricuspid stenosis. Aortic Valve: The aortic valve is tricuspid. Aortic valve regurgitation is not visualized. No aortic stenosis is present. Aortic valve mean gradient measures 6.0 mmHg. Aortic valve peak gradient measures 11.8 mmHg. Aortic valve area, by VTI measures 3.65  cm. Pulmonic Valve: The pulmonic valve was normal in structure. Pulmonic valve regurgitation is not visualized. No evidence of pulmonic stenosis. Aorta: The aortic root is normal in size and  structure. Venous: The inferior vena cava is dilated in size with less than 50% respiratory variability, suggesting right atrial pressure of 15 mmHg. IAS/Shunts: No atrial level shunt detected by color flow Doppler.  LEFT VENTRICLE PLAX 2D LVIDd:         4.10 cm     Diastology LVIDs:         3.10 cm     LV e' medial:    4.79 cm/s LV PW:         1.50 cm     LV E/e' medial:  14.8 LV IVS:        1.50 cm     LV e' lateral:   7.04 cm/s LVOT diam:     2.20 cm     LV E/e' lateral: 10.1 LV SV:         120 LV SV Index:   62          2D Longitudinal Strain LVOT Area:     3.80 cm    2D Strain GLS (A2C):   -10.4 %                            2D Strain GLS (A3C):   -17.3 %                            2D Strain GLS (A4C):   -14.4 % LV Volumes (MOD)           2D Strain GLS Avg:     -14.0 % LV vol d, MOD A2C: 92.9 ml LV vol d, MOD A4C: 88.4 ml LV vol s, MOD A2C: 25.7 ml LV vol s, MOD A4C: 46.7 ml LV SV MOD A2C:     67.2 ml LV SV MOD A4C:     88.4 ml LV  SV MOD BP:      54.4 ml RIGHT VENTRICLE RV Basal diam:  4.10 cm RV Mid diam:    1.70 cm RV S prime:     20.60 cm/s TAPSE (M-mode): 2.7 cm LEFT ATRIUM              Index        RIGHT ATRIUM           Index LA Vol (A2C):   104.0 ml 54.00 ml/m  RA Area:     19.30 cm LA Vol (A4C):   118.0 ml 61.27 ml/m  RA Volume:   56.90 ml  29.55 ml/m LA Biplane Vol: 113.0 ml 58.68 ml/m  AORTIC VALVE                     PULMONIC VALVE AV Area (Vmax):    3.67 cm      PV Vmax:       1.11 m/s AV Area (Vmean):   3.61 cm      PV Vmean:      80.100 cm/s AV Area (VTI):     3.65 cm      PV VTI:        0.235 m AV Vmax:           172.00 cm/s   PV Peak grad:  4.9 mmHg AV Vmean:          113.675 cm/s  PV Mean grad:  3.0 mmHg AV VTI:            0.328 m AV Peak Grad:      11.8 mmHg AV Mean Grad:      6.0 mmHg LVOT Vmax:         166.00 cm/s LVOT Vmean:        108.000 cm/s LVOT VTI:          0.314 m LVOT/AV VTI ratio: 0.96  AORTA Ao Root diam: 3.40 cm Ao Asc diam:  3.50 cm MITRAL VALVE               TRICUSPID  VALVE MV Area (PHT): 2.56 cm    TR Peak grad:   33.6 mmHg MV Area VTI:   3.90 cm    TR Vmax:        290.00 cm/s MV Peak grad:  4.0 mmHg MV Mean grad:  1.0 mmHg    SHUNTS MV Vmax:       1.00 m/s    Systemic VTI:  0.31 m MV Vmean:      54.0 cm/s   Systemic Diam: 2.20 cm MV Decel Time: 296 msec MV E velocity: 71.00 cm/s MV A velocity: 97.30 cm/s MV E/A ratio:  0.73 Skeet Latch MD Electronically signed by Skeet Latch MD Signature Date/Time: 01/24/2022/11:14:58 AM    Final    US RENAL  Result Date: 01/23/2022 CLINICAL DATA:  Acute on chronic renal failure. EXAM: RENAL / URINARY TRACT ULTRASOUND COMPLETE COMPARISON:  CT abdomen and pelvis 02/09/2021. Renal ultrasound 03/12/2021. FINDINGS: Right Kidney: Renal measurements: 9.1 x 4.1 x 3.8 cm = volume: 73 mL. Echogenicity is increased. No hydronephrosis. There is a 3.7 x 2.8 x 3.8 cm cyst in the superior pole the right kidney which has mildly increased in size. Left Kidney: Renal measurements: 7.8 x 5.6 x 4.0 cm = volume: 92 mL. Echogenicity is increased. No mass or hydronephrosis visualized. Bladder: Appears normal for degree of bladder distention. Other: Right pleural effusion present. IMPRESSION: 1. Echogenic kidneys  likely related to medical renal disease, similar to prior study. 2. No hydronephrosis. 3. Right renal cyst has mildly increased in size. 4. Right pleural effusion. Electronically Signed   By: Ronney Asters M.D.   On: 01/23/2022 23:37   DG Chest Portable 1 View  Result Date: 01/23/2022 CLINICAL DATA:  Chest pain. EXAM: PORTABLE CHEST 1 VIEW COMPARISON:  Chest x-ray 11/17/2011. CT abdomen and pelvis 03/11/2021. FINDINGS: There is patchy airspace disease in the bilateral lower lobes. There is likely a small left pleural effusion. Cardiac silhouette is enlarged, a new finding. No evidence for pneumothorax. No acute fractures. IMPRESSION: 1. New enlarged cardiac silhouette may represent cardiomegaly and or pericardial effusion. 2. Bilateral lower  lobe airspace disease worrisome for infection. 3. Small left pleural effusion. Electronically Signed   By: Ronney Asters M.D.   On: 01/23/2022 17:11    sodium chloride   Intravenous Once   amLODipine  10 mg Oral Daily   aspirin EC  81 mg Oral Daily   calcium acetate  1,334 mg Oral TID WC   carvedilol  6.25 mg Oral BID WC   Chlorhexidine Gluconate Cloth  6 each Topical Q0600   heparin injection (subcutaneous)  5,000 Units Subcutaneous Q8H   hydrALAZINE  50 mg Oral Q6H   mouth rinse  15 mL Mouth Rinse BID   sodium bicarbonate  1,300 mg Oral TID   sodium chloride flush  10-40 mL Intracatheter Q12H    BMET    Component Value Date/Time   NA 135 01/25/2022 0122   K 4.0 01/25/2022 0122   CL 98 01/25/2022 0122   CO2 19 (L) 01/25/2022 0122   GLUCOSE 97 01/25/2022 0122   BUN 83 (H) 01/25/2022 0122   CREATININE 14.28 (H) 01/25/2022 0122   CALCIUM 8.3 (L) 01/25/2022 0122   GFRNONAA 3 (L) 01/25/2022 0122   CBC    Component Value Date/Time   WBC 13.8 (H) 01/25/2022 0122   RBC 2.68 (L) 01/25/2022 0122   HGB 7.8 (L) 01/25/2022 0122   HCT 22.0 (L) 01/25/2022 0122   PLT 136 (L) 01/25/2022 0122   MCV 82.1 01/25/2022 0122   MCH 29.1 01/25/2022 0122   MCHC 35.5 01/25/2022 0122   RDW 16.6 (H) 01/25/2022 0122   LYMPHSABS 1.1 01/23/2022 1515   MONOABS 0.8 01/23/2022 1515   EOSABS 0.0 01/23/2022 1515   BASOSABS 0.0 01/23/2022 1515    Assessment/Plan:   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  We discussed the severity and chronicity of his CKD and he has a strong family history of ESRD.  Renal US consistent with chronic medical renal disease.  No obstruction or hydro.  Tunneled HD catheter placed 01/24/22 and first HD session late last night/early this morning.  Will plan for second HD session tomorrow. Hypertensive emergency - markedly improved with Cardene gtt.  Appreciate PCCM's assistance.  Now off of cardene gtt Hyperkalemia - improved with  lasix and lokelma. Acute hypoxic respiratory failure - wean supplemental oxygen.  CXR with bilateral lower lobe airspace disease worrisome for infection.  Small left pleural effusion, moderate right pleural effusion.  Antibiotics per PCCM.  Will UF with next HD session but may need thoracentesis.  ECHO with normal EF, Grade I DD, elevated LVEDP and pulmonary HTN. Anemia of CKD stage V - iron 37, TSAT 19%, ferritin 654.  S/p blood transfusion.  Will start IV iron and ESA and follow. 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  iPTH pending but with low calcium, he will likely require vit D. Vascular access- IR consulted for [facility].  Will also need to consult [facility] for AVF/AVG. Disposition - he will need outpatient HD arrangements prior to discharge as he is likely ESRD.  Donetta Potts, MD [facility]

## 2022-01-25 NOTE — Consult Note (Addendum)
[facility] [place] NOTE   MRN : [id number]  Reason for Consult: CKD stage IV with uncontrolled  Referring Physician: Colodonato MD  History of Present Illness: 71 y/o male admitted with CP and uncontrolled HTN has history of CKD stage IV.  We have been asked to provide permanent access for pending HD.  He developed  hypertensive emergency, hyperkalemia, and acute hypoxic respiratory failure.  AKI on CKD now in need of dialysis.  IR has been consulted for [facility] placement.    He states he feels better and still has mild CP.  He denies previous access or chest implants.  He is right hand dominant.          Current Facility-Administered Medications  Medication Dose Route Frequency Provider Last Rate Last Admin   0.9 %  sodium chloride infusion (Manually program via Guardrails IV Fluids)   Intravenous Once Anders Simmonds, MD       acetaminophen (TYLENOL) tablet 650 mg  650 mg Oral Q6H PRN Madelon Lips, MD       amLODipine (NORVASC) tablet 10 mg  10 mg Oral Daily Desai, Rahul P, PA-C   10 mg at 01/25/22 1100   anticoagulant sodium citrate solution 5 mL  5 mL Dialysis PRN Donato Heinz, MD       aspirin EC tablet 81 mg  81 mg Oral Daily Gleason, Otilio Carpen, PA-C   81 mg at 01/25/22 1100   calcium acetate (PHOSLO) capsule 1,334 mg  1,334 mg Oral TID WC Donato Heinz, MD   1,334 mg at 01/25/22 0811   carvedilol (COREG) tablet 6.25 mg  6.25 mg Oral BID WC Jacky Kindle, MD       cefTRIAXone (ROCEPHIN) 2 g in sodium chloride 0.9 % 100 mL IVPB  2 g Intravenous Q24H Margaretha Seeds, MD 200 mL/hr at 01/25/22 1059 2 g at 01/25/22 1059   Chlorhexidine Gluconate Cloth 2 % PADS 6 each  6 each Topical Q0600 Donato Heinz, MD   6 each at 01/24/22 1230   docusate sodium (COLACE) capsule 100 mg  100 mg Oral BID PRN Gleason, Otilio Carpen, PA-C       fentaNYL (SUBLIMAZE) injection 25 mcg  25 mcg Intravenous Q4H PRN Gleason, Otilio Carpen, PA-C   25 mcg at 01/24/22 1932    heparin injection 5,000 Units  5,000 Units Subcutaneous Q8H Desai, Rahul P, PA-C   5,000 Units at 01/25/22 0500   hydrALAZINE (APRESOLINE) tablet 50 mg  50 mg Oral Q6H Desai, Rahul P, PA-C   50 mg at 01/25/22 0500   MEDLINE mouth rinse  15 mL Mouth Rinse BID Margaretha Seeds, MD   15 mL at 01/24/22 2232   nitroGLYCERIN (NITROSTAT) SL tablet 0.4 mg  0.4 mg Sublingual Q5 min PRN Madelon Lips, MD       polyethylene glycol (MIRALAX / GLYCOLAX) packet 17 g  17 g Oral Daily PRN Gleason, Otilio Carpen, PA-C       sodium bicarbonate tablet 1,300 mg  1,300 mg Oral TID Madelon Lips, MD   1,300 mg at 01/25/22 1100   sodium chloride flush (NS) 0.9 % injection 10-40 mL  10-40 mL Intracatheter Q12H Margaretha Seeds, MD   10 mL at 01/25/22 1100   sodium chloride flush (NS) 0.9 % injection 10-40 mL  10-40 mL Intracatheter PRN Margaretha Seeds, MD        Pt meds include: Statin :Yes Betablocker: No ASA: Yes Other anticoagulants/antiplatelets:  Past Medical History:  Diagnosis Date   CKD (chronic kidney disease) stage 4, GFR 15-29 ml/min (HCC)    Hypertension     Past Surgical History:  Procedure Laterality Date   IR FLUORO GUIDE CV LINE RIGHT  01/24/2022   IR US GUIDE VASC ACCESS RIGHT  01/24/2022    Social History Social History   Tobacco Use   Smoking status: Some Days    Types: Cigarettes    Family History History reviewed. No pertinent family history.  No Known Allergies   REVIEW OF SYSTEMS  General: [ ]  Weight loss, [ ]  Fever, [ ]  chills Neurologic: [ ]  Dizziness, [ ]  Blackouts, [ ]  Seizure [ ]  Stroke, [ ]  "Mini stroke", [ ]  Slurred speech, [ ]  Temporary blindness; [ ]  weakness in arms or legs, [ ]  Hoarseness [ ]  Dysphagia Cardiac: [ x] Chest pain/pressure, [ ]  Shortness of breath at rest [ ]  Shortness of breath with exertion, [ ]  Atrial fibrillation or irregular heartbeat  Vascular: [ ]  Pain in legs with walking, [ ]  Pain in legs at rest, [ ]  Pain in legs at night,  [ ]   Non-healing ulcer, [ ]  Blood clot in vein/DVT,   Pulmonary: [ ]  Home oxygen, [ ]  Productive cough, [ ]  Coughing up blood, [ ]  Asthma,  [ ]  Wheezing [ ]  COPD Musculoskeletal:  [ ]  Arthritis, [ ]  Low back pain, [ ]  Joint pain Hematologic: [ ]  Easy Bruising, [ ]  Anemia; [ ]  Hepatitis Gastrointestinal: [ ]  Blood in stool, [ ]  Gastroesophageal Reflux/heartburn, Urinary: [ ]  chronic Kidney disease, [ x] on HD - [ ]  MWF or [ ]  TTHS, [ ]  Burning with urination, [ ]  Difficulty urinating Skin: [ ]  Rashes, [ ]  Wounds Psychological: [ ]  Anxiety, [ ]  Depression  Physical Examination Vitals:   01/25/22 0945 01/25/22 1000 01/25/22 1015 01/25/22 1100  BP:  (!) 151/90  (!) 158/88  Pulse: 87 85 85   Resp: (!) 21 (!) 24 (!) 38   Temp:      TempSrc:      SpO2: 93% 93% 94%   Weight:      Height:       Body mass index is 21.95 kg/m.  General:  WDWN in NAD HENT: WNL Eyes: Pupils equal Pulmonary: normal non-labored breathing , without Rales, rhonchi,  wheezing Cardiac: RRR, without  Murmurs, rubs or gallops; No carotid bruits Abdomen: soft, NT, no masses Skin: no rashes, ulcers noted;  no Gangrene , no cellulitis; no open wounds;   Vascular Exam/Pulses:palpable radial pulses B   Musculoskeletal: no muscle wasting or atrophy; positive generalized edema  Neurologic: A&O X 3; Appropriate Affect ;  SENSATION: normal; MOTOR FUNCTION: 5/5 Symmetric Speech is fluent/normal   Significant Diagnostic Studies: CBC Lab Results  Component Value Date   WBC 13.8 (H) 01/25/2022   HGB 7.8 (L) 01/25/2022   HCT 22.0 (L) 01/25/2022   MCV 82.1 01/25/2022   PLT 136 (L) 01/25/2022    BMET    Component Value Date/Time   NA 135 01/25/2022 0122   K 4.0 01/25/2022 0122   CL 98 01/25/2022 0122   CO2 19 (L) 01/25/2022 0122   GLUCOSE 97 01/25/2022 0122   BUN 83 (H) 01/25/2022 0122   CREATININE 14.28 (H) 01/25/2022 0122   CALCIUM 8.3 (L) 01/25/2022 0122   GFRNONAA 3 (L) 01/25/2022 0122   Estimated  Creatinine Clearance: 5 mL/min (A) (by C-G formula based on SCr of 14.28 mg/dL (H)).  COAG No results found for: "INR", "PROTIME"   Non-Invasive Vascular Imaging:  Pending vein mapping  ASSESSMENT/PLAN:  AKI on CKD with admission for CP and hypertensive emergency He has a working [facility] right IJ placed by IR. He is right hand dominant.  I have ordered vein mapping. Plan will be left UE AV fistula verse graft possible 01/28/22 by Dr. Scot Dock.   Roxy Horseman 01/25/2022 12:31 PM  I have independently interviewed and examined patient and agree with PA assessment and plan above.   Thermon Zulauf C. Donzetta Matters, MD [facility] [place] Office: [phone] Pager: [phone]

## 2022-01-25 NOTE — Progress Notes (Signed)
BUE vein mapping has been completed.  Results can be found under chart review under CV PROC. 01/25/2022 4:38 PM Vivion Romano RVT, RDMS

## 2022-01-26 LAB — CBC
HCT: 22.6 % — ABNORMAL LOW (ref 39.0–52.0)
Hemoglobin: 7.7 g/dL — ABNORMAL LOW (ref 13.0–17.0)
MCH: 28 pg (ref 26.0–34.0)
MCHC: 34.1 g/dL (ref 30.0–36.0)
MCV: 82.2 fL (ref 80.0–100.0)
Platelets: 137 10*3/uL — ABNORMAL LOW (ref 150–400)
RBC: 2.75 MIL/uL — ABNORMAL LOW (ref 4.22–5.81)
RDW: 16.4 % — ABNORMAL HIGH (ref 11.5–15.5)
WBC: 11.1 10*3/uL — ABNORMAL HIGH (ref 4.0–10.5)
nRBC: 0 % (ref 0.0–0.2)

## 2022-01-26 LAB — BASIC METABOLIC PANEL
Anion gap: 14 (ref 5–15)
BUN: 108 mg/dL — ABNORMAL HIGH (ref 8–23)
CO2: 20 mmol/L — ABNORMAL LOW (ref 22–32)
Calcium: 8.2 mg/dL — ABNORMAL LOW (ref 8.9–10.3)
Chloride: 98 mmol/L (ref 98–111)
Creatinine, Ser: 17.93 mg/dL — ABNORMAL HIGH (ref 0.61–1.24)
GFR, Estimated: 3 mL/min — ABNORMAL LOW (ref >60)
Glucose, Bld: 88 mg/dL (ref 70–99)
Potassium: 4.9 mmol/L (ref 3.5–5.1)
Sodium: 132 mmol/L — ABNORMAL LOW (ref 135–145)

## 2022-01-26 LAB — MAGNESIUM: Magnesium: 2 mg/dL (ref 1.7–2.4)

## 2022-01-26 LAB — PHOSPHORUS: Phosphorus: 8.9 mg/dL — ABNORMAL HIGH (ref 2.5–4.6)

## 2022-01-26 MED ORDER — CARVEDILOL 25 MG PO TABS
25.0000 mg | ORAL_TABLET | Freq: Two times a day (BID) | ORAL | Status: DC
  Administered 2022-01-26 – 2022-01-31 (×7): 25 mg via ORAL
  Filled 2022-01-26 (×8): qty 1

## 2022-01-26 MED ORDER — POLYETHYLENE GLYCOL 3350 17 G PO PACK
17.0000 g | PACK | Freq: Every day | ORAL | Status: DC
  Administered 2022-01-26 – 2022-01-29 (×4): 17 g via ORAL
  Filled 2022-01-26 (×5): qty 1

## 2022-01-26 MED ORDER — DOCUSATE SODIUM 100 MG PO CAPS
100.0000 mg | ORAL_CAPSULE | Freq: Two times a day (BID) | ORAL | Status: DC
  Administered 2022-01-26 – 2022-01-31 (×8): 100 mg via ORAL
  Filled 2022-01-26 (×10): qty 1

## 2022-01-26 NOTE — Progress Notes (Signed)
  Progress Note    01/26/2022 9:25 AM * No surgery found *  Subjective: States that he is feeling much better since admission  Vitals:   01/26/22 0900 01/26/22 0915  BP: (!) 145/89 (!) 135/92  Pulse: 83 85  Resp: 14 15  Temp:    SpO2: 95% 96%    Physical Exam: Awake alert oriented Moving all extremities without limitation Palpable bilateral radial pulses  CBC    Component Value Date/Time   WBC 11.1 (H) 01/26/2022 0258   RBC 2.75 (L) 01/26/2022 0258   HGB 7.7 (L) 01/26/2022 0258   HCT 22.6 (L) 01/26/2022 0258   PLT 137 (L) 01/26/2022 0258   MCV 82.2 01/26/2022 0258   MCH 28.0 01/26/2022 0258   MCHC 34.1 01/26/2022 0258   RDW 16.4 (H) 01/26/2022 0258   LYMPHSABS 1.1 01/23/2022 1515   MONOABS 0.8 01/23/2022 1515   EOSABS 0.0 01/23/2022 1515   BASOSABS 0.0 01/23/2022 1515    BMET    Component Value Date/Time   NA 132 (L) 01/26/2022 0258   K 4.9 01/26/2022 0258   CL 98 01/26/2022 0258   CO2 20 (L) 01/26/2022 0258   GLUCOSE 88 01/26/2022 0258   BUN 108 (H) 01/26/2022 0258   CREATININE 17.93 (H) 01/26/2022 0258   CALCIUM 8.2 (L) 01/26/2022 0258   GFRNONAA 3 (L) 01/26/2022 0258    INR No results found for: "INR"   Intake/Output Summary (Last 24 hours) at 01/26/2022 0925 Last data filed at 01/25/2022 2200 Gross per 24 hour  Intake 930 ml  Output 200 ml  Net 730 ml     Assessment:  71 y.o. male is now end-stage renal disease on dialysis via catheter.  Plan: OR Monday for left arm AV fistula versus graft, appears to have suitable basilic vein.  Restrict left upper extremity and will need to remove IV.   Loron Weimer C. Donzetta Matters, MD [facility] [place] Office: [phone] Pager: [phone]  01/26/2022 9:25 AM

## 2022-01-26 NOTE — Progress Notes (Signed)
NAME:  Tanner Rollins, MRN:  [id number], DOB:  [birth year], LOS: 3 ADMISSION DATE:  01/23/2022, CONSULTATION DATE:  01/26/22 REFERRING MD:  EDP, CHIEF COMPLAINT:  chest pain   History of Present Illness:  Tanner Rollins is a 71 y.o. M with PMH of HTN and CKD  who presents with worsening chest pain.  He reports financial difficulty obtaining his medications so stopped taking them approximately two months ago.  He reports intermittent chest pain over the last two weeks with sudden worsening yesterday; the pain is described as sharp and pressure-like, mid-sternal, does not radiate and is constant.   Has been eating and drinking less over the last couple of days as well, denies nausea and vomiting, headache or vision changes.    Pt denies significant ETOH use or drug use besides occasional marijuana  He was given Asa and nitro by EMS and had an initial BP of 228/130 in the ED.  He was started on a nitro gtt and then transitioned to cardene.   Labs significant for BNP 2684, troponin 323, lipase 210 and creatinine of 26 up from baseline of 4-5.  He is still complaining of chest pain at the time of admission  Pertinent  Medical History   has a past medical history of CKD (chronic kidney disease) stage 4, GFR 15-29 ml/min (HCC) and Hypertension.   Significant Hospital Events: Including procedures, antibiotic start and stop dates in addition to other pertinent events   6/7 admit to ICU with hypertensive emergency and acute on chronic renal failure 6/8 tunneled catheter was placed, started on dialysis, came off of Cardene   Interim History / Subjective:  Patient is receiving second session of hemodialysis, plan is to pull out 2 L of fluid Remained hypertensive, off Cardene infusion  Objective   Blood pressure (!) 162/83, pulse 86, temperature 98.7 F (37.1 C), resp. rate 20, height 5' 11.5" (1.816 m), weight 72.4 kg, SpO2 94 %.        Intake/Output Summary (Last 24 hours) at 01/26/2022 5784 Last  data filed at 01/25/2022 2200 Gross per 24 hour  Intake 1200 ml  Output 200 ml  Net 1000 ml   Filed Weights   01/23/22 1557 01/23/22 1824 01/24/22 0455  Weight: 68 kg 70.1 kg 72.4 kg   Physical exam: General: Acute on chronically ill-appearing male, lying on the bed HEENT: [place]/AT, eyes anicteric.  moist mucus membranes Neuro: Alert, awake following commands Chest: Coarse breath sounds, no wheezes or rhonchi Heart: Regular rate and rhythm, no murmurs or gallops Abdomen: Soft, nontender, nondistended, bowel sounds present Skin: No rash   Assessment & Plan:   Hypertensive Emergency - Stopped home medications months ago for financial reasons Demand cardiac ischemia in the setting of hypertensive emergency Chronic HFpEF Patient required Cardene infusion, currently off Continue amlodipine, hydralazine and increase Coreg to 25 mg twice daily Patient will need ischemic work-up as an outpatient Continue Asa  Echocardiogram is suggestive of diastolic dysfunction Monitor intake and output Patient does not look volume overload  AKI on CKD stage IV in the setting of hypertensive emergency  Metabolic acidosis Hypophosphatemia/hypervolemic hyponatremia Patient failed Lasix challenge Tunneled catheter was placed on 6/8 He is receiving second session of hemodialysis, plan is to remove 2 L of IV fluid Appreciate nephrology follow-up Continue oral bicarbonate 13 mg 3 times daily  Closely monitor electrolytes  Acute Hypoxic Respiratory Failure Bilateral pleural effusion Aspiration pneumonia Patient continued to require oxygen, titrate with O2 sat goal 88-92% He does have  bilateral effusion, will reevaluate after hemodialysis if he has persistent large-volume pleural effusion, will proceed with thoracentesis Continue IV ceftriaxone Cultures have been negative White count has been trending down Complete antibiotics for 5 days  Acute pancreatitis was ruled  out   Hyperkalemia/hypomagnesemia, resolved  Anemia of chronic disease Monitor H&H and transfuse if less than 7  Best Practice (right click and "Reselect all SmartList Selections" daily)   Diet/type: Regular consistency (see orders) DVT prophylaxis: prophylactic heparin  GI prophylaxis: N/A Lines: Dialysis Catheter  Foley:  N/A Code Status:  full code Last date of multidisciplinary goals of care discussion [6/10: With patient, he would like to continue full scope of care   Critical care time:      Jacky Kindle, MD [place] [facility] See Amion for pager If no response to pager, please call [phone] until 7pm After 7pm, Please call E-link [phone]

## 2022-01-26 NOTE — Evaluation (Signed)
Physical Therapy Evaluation Patient Details Name: Tanner Rollins MRN: [id number] DOB: 1950/09/26 Today's Date: 01/26/2022  History of Present Illness  pt is a 71 y/o male presenting with worsening chest pain.  Hospital course includes admit to ICU for hypertensive emergency and Acute on chronic renal failue.  Pt moving now toward HD, 6/8 tunneled catheter placed, dialysis started.  PMH:  HTN, CKD.  Clinical Impression  Pt admitted with/for hypertensive emergency and A/CRF.  Pt is much weaker and more unsteady than at baseline, needing min assist for limited mobility/gait  OOB.  Pt currently limited functionally due to the problems listed. ( See problems list.)   Pt will benefit from PT to maximize function and safety in order to get ready for next venue listed below.        Recommendations for follow up therapy are one component of a multi-disciplinary discharge planning process, led by the attending physician.  Recommendations may be updated based on patient status, additional functional criteria and insurance authorization.  Follow Up Recommendations Other (comment) (needs initial assist/supervision up to 24 hours on ST SNF)    Assistance Recommended at Discharge Frequent or constant Supervision/Assistance (to intermittent)  Patient can return home with the following  A little help with walking and/or transfers;A little help with bathing/dressing/bathroom;Assistance with cooking/housework;Assist for transportation    Equipment Recommendations Rolling walker (2 wheels);BSC/3in1 (TBD)  Recommendations for Other Services       Functional Status Assessment Patient has had a recent decline in their functional status and demonstrates the ability to make significant improvements in function in a reasonable and predictable amount of time.     Precautions / Restrictions Precautions Precautions: Fall      Mobility  Bed Mobility Overal bed mobility: Modified Independent              General bed mobility comments: slow in/out, but more difficulty getting LE's in bed after fatigue from time up.    Transfers Overall transfer level: Needs assistance   Transfers: Sit to/from Stand Sit to Stand: Min assist           General transfer comment: cues for hand placement, stability assist overall and stability once upright with soft buckle/flex of bil knees.    Ambulation/Gait Ambulation/Gait assistance: Min assist Gait Distance (Feet): 10 Feet Assistive device: Rolling walker (2 wheels) Gait Pattern/deviations: Step-through pattern   Gait velocity interpretation: <1.31 ft/sec, indicative of household ambulator   General Gait Details: unsteady weak-kneed gait pattern.  Pt getting progressively dizzier throughout the gait trial.  BP on sitting 158/90 (108).  Pt feeling tired and fatigued  (HD this am)  Stairs            Wheelchair Mobility    Modified Rankin (Stroke Patients Only)       Balance Overall balance assessment: Needs assistance Sitting-balance support: No upper extremity supported, Single extremity supported, Feet supported Sitting balance-Leahy Scale: Fair     Standing balance support: Single extremity supported, Bilateral upper extremity supported Standing balance-Leahy Scale: Poor Standing balance comment: reliant on external support and /or AD                             Pertinent Vitals/Pain Pain Assessment Pain Assessment: No/denies pain    Home Living Family/patient expects to be discharged to:: Private residence Living Arrangements: Alone Available Help at Discharge: Family;Available PRN/intermittently Type of Home: Apartment Home Access: [place]  Layout: One level Home Equipment: None      Prior Function Prior Level of Function : Independent/Modified Independent;Driving                     Hand Dominance        Extremity/Trunk Assessment   Upper Extremity Assessment Upper  Extremity Assessment: Generalized weakness    Lower Extremity Assessment Lower Extremity Assessment: Generalized weakness    Cervical / Trunk Assessment Cervical / Trunk Assessment: Normal  Communication   Communication: No difficulties  Cognition Arousal/Alertness: Awake/alert Behavior During Therapy: WFL for tasks assessed/performed Overall Cognitive Status: No family/caregiver present to determine baseline cognitive functioning                                          General Comments General comments (skin integrity, edema, etc.): SpO2 mid 90's on 2L [place]    Exercises     Assessment/Plan    PT Assessment Patient needs continued PT services  PT Problem List Decreased strength;Decreased activity tolerance;Decreased balance;Decreased mobility;Decreased knowledge of use of DME       PT Treatment Interventions DME instruction;Gait training;Functional mobility training;Therapeutic activities;Balance training;Patient/family education    PT Goals (Current goals can be found in the Care Plan section)  Acute Rehab PT Goals Patient Stated Goal: get back home and be able to handle HD by myself. PT Goal Formulation: With patient Time For Goal Achievement: 02/09/22 Potential to Achieve Goals: Good    Frequency Min 3X/week     Co-evaluation               AM-PAC PT "6 Clicks" Mobility  Outcome Measure Help needed turning from your back to your side while in a flat bed without using bedrails?: None Help needed moving from lying on your back to sitting on the side of a flat bed without using bedrails?: None Help needed moving to and from a bed to a chair (including a wheelchair)?: A Little Help needed standing up from a chair using your arms (e.g., wheelchair or bedside chair)?: A Little Help needed to walk in hospital room?: A Little Help needed climbing 3-5 steps with a railing? : A Lot 6 Click Score: 19    End of Session Equipment Utilized During  Treatment: Oxygen Activity Tolerance: Patient tolerated treatment well;Patient limited by fatigue Patient left: in bed;with call bell/phone within reach;with bed alarm set Nurse Communication: Mobility status PT Visit Diagnosis: Unsteadiness on feet (R26.81);Muscle weakness (generalized) (M62.81)    Time: 6378-5885 PT Time Calculation (min) (ACUTE ONLY): 23 min   Charges:   PT Evaluation $PT Eval Moderate Complexity: 1 Mod PT Treatments $Gait Training: 8-22 mins        01/26/2022  Tanner Carne., PT Acute Rehabilitation Services [phone]  (pager) [phone]  (office)  Tanner Rollins 01/26/2022, 4:39 PM

## 2022-01-26 NOTE — Progress Notes (Signed)
Patient transferred to 5M09. Patient ambulates to wheelchair with 1 assist. On 2L [place] to maintain O2 sat >92. Cell phone and charger sent with patient.

## 2022-01-26 NOTE — Progress Notes (Signed)
Patient ID: Tanner Rollins, male   DOB: March 08, 1951, 71 y.o.   MRN: [id number] S: Seen on dialysis and doing well.  Off of supplemental oxygen. O:BP (!) 150/91   Pulse 93   Temp 98 F (36.7 C) (Oral)   Resp 18   Ht 5' 11.5" (1.816 m)   Wt 73.6 kg   SpO2 92%   BMI 22.32 kg/m   Intake/Output Summary (Last 24 hours) at 01/26/2022 1135 Last data filed at 01/26/2022 1113 Gross per 24 hour  Intake 830 ml  Output 2200 ml  Net -1370 ml   Intake/Output: I/O last 3 completed shifts: In: 1604.2 [P.O.:1490; I.V.:14.2; IV Piggyback:100] Out: 350 [Urine:350]  Intake/Output this shift:  Total I/O In: 10 [I.V.:10] Out: 2000 [Other:2000] Weight change:  Gen:NAD CVS: RRR Resp:Decreased BS at bases Abd:+BS, soft, NT/ND Ext: no edema  Recent Labs  Lab 01/23/22 1515 01/23/22 1527 01/23/22 1548 01/23/22 1850 01/23/22 2147 01/24/22 0017 01/24/22 0611 01/25/22 0122 01/26/22 0258  NA 137   < > 136 137 140 138 135 135 132*  K 5.0   < > 4.9 5.7* 6.4* 4.5 5.1 4.0 4.9  CL 108  --   --  108 106 106  --  98 98  CO2 9*  --   --  8* 15* 12*  --  19* 20*  GLUCOSE 135*  --   --  128* 160* 172*  --  97 88  BUN 140*  --   --  150* 149* 145*  --  83* 108*  CREATININE 26.11*  --   --  27.02* 24.77* 24.40*  --  14.28* 17.93*  ALBUMIN 3.0*  --   --  2.9*  --   --   --   --   --   CALCIUM 8.9  --   --  8.9 8.3* 8.8*  --  8.3* 8.2*  PHOS  --   --   --  9.0*  --  9.1*  --  6.3* 8.9*  AST 24  --   --   --   --   --   --   --   --   ALT 14  --   --   --   --   --   --   --   --    < > = values in this interval not displayed.   Liver Function Tests: Recent Labs  Lab 01/23/22 1515 01/23/22 1850  AST 24  --   ALT 14  --   ALKPHOS 81  --   BILITOT 0.9  --   PROT 6.2*  --   ALBUMIN 3.0* 2.9*   Recent Labs  Lab 01/23/22 1515  LIPASE 210*   No results for input(s): "AMMONIA" in the last 168 hours. CBC: Recent Labs  Lab 01/23/22 1515 01/23/22 1527 01/23/22 2147 01/24/22 0017 01/24/22 0611  01/25/22 0122 01/26/22 0258  WBC 10.0  --  12.6* 12.5*  --  13.8* 11.1*  NEUTROABS 8.0*  --   --   --   --   --   --   HGB 8.0*   < > 6.4* 6.9* 7.1* 7.8* 7.7*  HCT 24.2*   < > 19.4* 20.3* 21.0* 22.0* 22.6*  MCV 85.2  --  82.9 81.9  --  82.1 82.2  PLT 132*  --  122* 119*  --  136* 137*   < > = values in this interval not displayed.   Cardiac Enzymes: No results  for input(s): "CKTOTAL", "CKMB", "CKMBINDEX", "TROPONINI" in the last 168 hours. CBG: Recent Labs  Lab 01/23/22 1912 01/24/22 0421  GLUCAP 115* 161*    Iron Studies:  Recent Labs    01/24/22 0017  IRON 37*  TIBC 196*  FERRITIN 654*   Studies/Results: VAS [place] UPPER EXT VEIN MAPPING (PRE-OP AVF)  Result Date: 01/26/2022 UPPER EXTREMITY VEIN MAPPING Patient Name:  Tanner Rollins  Date of Exam:   01/25/2022 Medical Rec #: [id number]        Accession #:    [id number] Date of Birth: May 16, 1951        Patient Gender: M Patient Age:   53 years Exam Location:  [facility] Procedure:      VAS [place] UPPER EXT VEIN MAPPING (PRE-OP AVF) Referring Phys: EMMA COLLINS --------------------------------------------------------------------------------  Indications: Pre-access. History: HTN, SMK, CKD4 (now in need of HD).  Limitations: Multiple IVs/tape Comparison Study: No previous exams Performing Technologist: Jody Hill RVT, RDMS  Examination Guidelines: A complete evaluation includes B-mode imaging, spectral Doppler, color Doppler, and power Doppler as needed of all accessible portions of each vessel. Bilateral testing is considered an integral part of a complete examination. Limited examinations for reoccurring indications may be performed as noted. +-----------------+-------------+----------+--------------+ Right Cephalic   Diameter (cm)Depth (cm)   Findings    +-----------------+-------------+----------+--------------+ Shoulder             0.28        0.42                  +-----------------+-------------+----------+--------------+  Prox upper arm       0.31        0.44                  +-----------------+-------------+----------+--------------+ Mid upper arm        0.28        0.23                  +-----------------+-------------+----------+--------------+ Dist upper arm       0.27        0.35                  +-----------------+-------------+----------+--------------+ Antecubital fossa    0.40        0.15      thrombus    +-----------------+-------------+----------+--------------+ Prox forearm         0.35        0.26                  +-----------------+-------------+----------+--------------+ Mid forearm          0.31        0.32                  +-----------------+-------------+----------+--------------+ Dist forearm                            not visualized +-----------------+-------------+----------+--------------+ Wrist                0.18        0.22                  +-----------------+-------------+----------+--------------+ +-----------------+-------------+----------+---------+ Right Basilic    Diameter (cm)Depth (cm)Findings  +-----------------+-------------+----------+---------+ Prox upper arm       0.38                         +-----------------+-------------+----------+---------+ Mid upper arm  0.26                         +-----------------+-------------+----------+---------+ Dist upper arm       0.24                         +-----------------+-------------+----------+---------+ Antecubital fossa    0.27               branching +-----------------+-------------+----------+---------+ Prox forearm         0.16                         +-----------------+-------------+----------+---------+ Mid forearm          0.17                         +-----------------+-------------+----------+---------+ Distal forearm       0.15               branching +-----------------+-------------+----------+---------+ Wrist                0.10                          +-----------------+-------------+----------+---------+ Thrumbus seen in cephalic/AC - site of lab draw +-----------------+-------------+----------+--------------+ Left Cephalic    Diameter (cm)Depth (cm)   Findings    +-----------------+-------------+----------+--------------+ Shoulder             0.21        0.50                  +-----------------+-------------+----------+--------------+ Prox upper arm       0.17        0.32                  +-----------------+-------------+----------+--------------+ Mid upper arm        0.15        0.32                  +-----------------+-------------+----------+--------------+ Dist upper arm       0.22        0.42                  +-----------------+-------------+----------+--------------+ Antecubital fossa    0.27        0.22   IV / thrombus  +-----------------+-------------+----------+--------------+ Prox forearm                            not visualized +-----------------+-------------+----------+--------------+ Mid forearm          0.21        0.43     branching    +-----------------+-------------+----------+--------------+ Dist forearm         0.16        0.31                  +-----------------+-------------+----------+--------------+ Wrist                0.10        0.33                  +-----------------+-------------+----------+--------------+ +-----------------+-------------+----------+---------+ Left Basilic     Diameter (cm)Depth (cm)Findings  +-----------------+-------------+----------+---------+ Prox upper arm       0.39                         +-----------------+-------------+----------+---------+  Mid upper arm        0.34                         +-----------------+-------------+----------+---------+ Dist upper arm       0.29                         +-----------------+-------------+----------+---------+ Antecubital fossa    0.19               branching  +-----------------+-------------+----------+---------+ Prox forearm         0.16                         +-----------------+-------------+----------+---------+ Mid forearm          0.18                         +-----------------+-------------+----------+---------+ Distal forearm       0.15                         +-----------------+-------------+----------+---------+ Wrist                0.10                         +-----------------+-------------+----------+---------+ *See table(s) above for measurements and observations.  Diagnosing physician: Servando Snare MD Electronically signed by Servando Snare MD on 01/26/2022 at 9:56:07 AM.    Final    DG CHEST PORT 1 VIEW  Result Date: 01/25/2022 CLINICAL DATA:  Hypoxemia. EXAM: PORTABLE CHEST 1 VIEW COMPARISON:  Radiograph January 23, 2022 FINDINGS: Interval placement of a dual-lumen right approach central venous catheter with tip overlying the right atrium. No visible pneumothorax. The heart size and mediastinal contours are partially obscured but appear unchanged. Increase in now moderate right pleural effusion and stable small left pleural effusion. Opacities in the bilateral lung bases along the pleural effusions are similar prior may reflect atelectasis or consolidation. No acute osseous abnormality. IMPRESSION: Interval placement of a dual lumen right-sided central venous catheter without visible pneumothorax. Increase in a now moderate right pleural effusion with a stable small left pleural effusion and unchanged adjacent bilateral airspace opacities reflecting infiltrate or atelectasis. Electronically Signed   By: Dahlia Bailiff M.D.   On: 01/25/2022 08:20   IR Fluoro Guide CV Line Right  Result Date: 01/24/2022 CLINICAL DATA:  Renal failure, needs durable venous access for planned hemodialysis EXAM: TUNNELED HEMODIALYSIS CATHETER PLACEMENT WITH ULTRASOUND AND FLUOROSCOPIC GUIDANCE TECHNIQUE: The procedure, risks, benefits, and alternatives were  explained to the patient. Questions regarding the procedure were encouraged and answered. The patient understands and consents to the procedure. As antibiotic prophylaxis, cefazolin 2 g was ordered pre-procedure and administered intravenously within one hour of incision. Patency of the right IJ vein was confirmed with ultrasound with image documentation. An appropriate skin site was determined. Region was prepped using maximum barrier technique including cap and mask, sterile gown, sterile gloves, large sterile sheet, and Chlorhexidine as cutaneous antisepsis. The region was infiltrated locally with 1% lidocaine. Under real-time ultrasound guidance, the right IJ vein was accessed with a 21 gauge micropuncture needle; the needle tip within the vein was confirmed with ultrasound image documentation. Needle exchanged over the 018 guidewire for transitional dilator, which allowed advancement of a Benson wire into the IVC. Over this, an MPA catheter  was advanced. A Palindrome 23 hemodialysis catheter was tunneled from the right anterior chest wall approach to the right IJ dermatotomy site. The MPA catheter was exchanged over an Amplatz wire for serial vascular dilators which allow placement of a peel-away sheath, through which the catheter was advanced under intermittent fluoroscopy, positioned with its tips in the proximal and midright atrium. Spot chest radiograph confirms good catheter position. No pneumothorax. Catheter was flushed and primed per protocol. Catheter secured externally with O Prolene sutures. The right IJ dermatotomy site was closed with Dermabond. COMPLICATIONS: COMPLICATIONS None immediate COMPARISON:  None Available. IMPRESSION: 1. Technically successful placement of tunneled right IJ hemodialysis catheter with ultrasound and fluoroscopic guidance. Ready for routine use. ACCESS: Remains approachable for percutaneous intervention as needed. Electronically Signed   By: Lucrezia Europe M.D.   On: 01/24/2022  15:55   IR US Guide Vasc Access Right  Result Date: 01/24/2022 CLINICAL DATA:  Renal failure, needs durable venous access for planned hemodialysis EXAM: TUNNELED HEMODIALYSIS CATHETER PLACEMENT WITH ULTRASOUND AND FLUOROSCOPIC GUIDANCE TECHNIQUE: The procedure, risks, benefits, and alternatives were explained to the patient. Questions regarding the procedure were encouraged and answered. The patient understands and consents to the procedure. As antibiotic prophylaxis, cefazolin 2 g was ordered pre-procedure and administered intravenously within one hour of incision. Patency of the right IJ vein was confirmed with ultrasound with image documentation. An appropriate skin site was determined. Region was prepped using maximum barrier technique including cap and mask, sterile gown, sterile gloves, large sterile sheet, and Chlorhexidine as cutaneous antisepsis. The region was infiltrated locally with 1% lidocaine. Under real-time ultrasound guidance, the right IJ vein was accessed with a 21 gauge micropuncture needle; the needle tip within the vein was confirmed with ultrasound image documentation. Needle exchanged over the 018 guidewire for transitional dilator, which allowed advancement of a Benson wire into the IVC. Over this, an MPA catheter was advanced. A Palindrome 23 hemodialysis catheter was tunneled from the right anterior chest wall approach to the right IJ dermatotomy site. The MPA catheter was exchanged over an Amplatz wire for serial vascular dilators which allow placement of a peel-away sheath, through which the catheter was advanced under intermittent fluoroscopy, positioned with its tips in the proximal and midright atrium. Spot chest radiograph confirms good catheter position. No pneumothorax. Catheter was flushed and primed per protocol. Catheter secured externally with O Prolene sutures. The right IJ dermatotomy site was closed with Dermabond. COMPLICATIONS: COMPLICATIONS None immediate COMPARISON:   None Available. IMPRESSION: 1. Technically successful placement of tunneled right IJ hemodialysis catheter with ultrasound and fluoroscopic guidance. Ready for routine use. ACCESS: Remains approachable for percutaneous intervention as needed. Electronically Signed   By: Lucrezia Europe M.D.   On: 01/24/2022 15:55    sodium chloride   Intravenous Once   amLODipine  10 mg Oral Daily   aspirin EC  81 mg Oral Daily   calcium acetate  1,334 mg Oral TID WC   carvedilol  25 mg Oral BID WC   Chlorhexidine Gluconate Cloth  6 each Topical Q0600   docusate sodium  100 mg Oral BID   heparin injection (subcutaneous)  5,000 Units Subcutaneous Q8H   hydrALAZINE  50 mg Oral Q6H   mouth rinse  15 mL Mouth Rinse BID   polyethylene glycol  17 g Oral Daily   sodium bicarbonate  1,300 mg Oral TID   sodium chloride flush  10-40 mL Intracatheter Q12H    BMET    Component Value Date/Time  NA 132 (L) 01/26/2022 0258   K 4.9 01/26/2022 0258   CL 98 01/26/2022 0258   CO2 20 (L) 01/26/2022 0258   GLUCOSE 88 01/26/2022 0258   BUN 108 (H) 01/26/2022 0258   CREATININE 17.93 (H) 01/26/2022 0258   CALCIUM 8.2 (L) 01/26/2022 0258   GFRNONAA 3 (L) 01/26/2022 0258   CBC    Component Value Date/Time   WBC 11.1 (H) 01/26/2022 0258   RBC 2.75 (L) 01/26/2022 0258   HGB 7.7 (L) 01/26/2022 0258   HCT 22.6 (L) 01/26/2022 0258   PLT 137 (L) 01/26/2022 0258   MCV 82.2 01/26/2022 0258   MCH 28.0 01/26/2022 0258   MCHC 34.1 01/26/2022 0258   RDW 16.4 (H) 01/26/2022 0258   LYMPHSABS 1.1 01/23/2022 1515   MONOABS 0.8 01/23/2022 1515   EOSABS 0.0 01/23/2022 1515   BASOSABS 0.0 01/23/2022 1515    Assessment/Plan:   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  We discussed the severity and chronicity of his CKD and he has a strong family history of ESRD.  Renal US consistent with chronic medical renal disease.  No obstruction or hydro.  Tunneled HD catheter placed 01/24/22 and  first HD session afterwards and had second today.  Will plan for HD again on Monday after he has his AVF creation by Dr. Scot Dock. Hypertensive emergency - markedly improved with Cardene gtt.  Appreciate PCCM's assistance.  Now off of cardene gtt Hyperkalemia - improved with lasix and lokelma. Acute hypoxic respiratory failure - wean supplemental oxygen.  CXR with bilateral lower lobe airspace disease worrisome for infection.  Small left pleural effusion, moderate right pleural effusion.  Antibiotics per PCCM.  Will UF with next HD session but may need thoracentesis.  ECHO with normal EF, Grade I DD, elevated LVEDP and pulmonary HTN. Anemia of CKD stage V - iron 37, TSAT 19%, ferritin 654.  S/p blood transfusion.  Will start IV iron and ESA and follow. 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  iPTH pending but with low calcium, he will likely require vit D. Vascular access- IR consulted for [facility].  Will also need to consult [facility] for AVF/AVG. Disposition - he will need outpatient HD arrangements prior to discharge as he is likely ESRD.  Donetta Potts, MD [facility]

## 2022-01-27 ENCOUNTER — Encounter (HOSPITAL_COMMUNITY): Payer: Self-pay | Admitting: Internal Medicine

## 2022-01-27 MED ORDER — MUPIROCIN 2 % EX OINT: 1.0000 "application " | TOPICAL_OINTMENT | Freq: Two times a day (BID) | CUTANEOUS | Status: DC

## 2022-01-27 NOTE — H&P (View-Only) (Signed)
  Progress Note    01/27/2022 9:16 AM * No surgery date entered *  Subjective: Feeling much better out of the ICU  Vitals:   01/27/22 0530 01/27/22 0855  BP: 137/77 130/80  Pulse: 70 71  Resp: 17 18  Temp: 98.4 F (36.9 C) 98.9 F (37.2 C)  SpO2: 96% 95%    Physical Exam: Awake alert oriented Nonlabored respirations Left brachial artery is 3+ palpable There is a forearm IV in the left he also has an IV on the right  CBC    Component Value Date/Time   WBC 11.1 (H) 01/26/2022 0258   RBC 2.75 (L) 01/26/2022 0258   HGB 7.7 (L) 01/26/2022 0258   HCT 22.6 (L) 01/26/2022 0258   PLT 137 (L) 01/26/2022 0258   MCV 82.2 01/26/2022 0258   MCH 28.0 01/26/2022 0258   MCHC 34.1 01/26/2022 0258   RDW 16.4 (H) 01/26/2022 0258   LYMPHSABS 1.1 01/23/2022 1515   MONOABS 0.8 01/23/2022 1515   EOSABS 0.0 01/23/2022 1515   BASOSABS 0.0 01/23/2022 1515    BMET    Component Value Date/Time   NA 132 (L) 01/26/2022 0258   K 4.9 01/26/2022 0258   CL 98 01/26/2022 0258   CO2 20 (L) 01/26/2022 0258   GLUCOSE 88 01/26/2022 0258   BUN 108 (H) 01/26/2022 0258   CREATININE 17.93 (H) 01/26/2022 0258   CALCIUM 8.2 (L) 01/26/2022 0258   GFRNONAA 3 (L) 01/26/2022 0258    INR No results found for: "INR"   Intake/Output Summary (Last 24 hours) at 01/27/2022 0916 Last data filed at 01/27/2022 0557 Gross per 24 hour  Intake 230 ml  Output 2000 ml  Net -1770 ml     Assessment:  71 y.o. male is now end-stage renal disease on dialysis via catheter.  Plan: OR tomorrow first case with Dr. Lou Cal for left arm AV fistula versus graft.  I discussed the risk benefits alternatives as well as the need for maturity and possible second operation for revision particularly if basilic vein fistula was created.  N.p.o. past midnight   Fenris Cauble C. Donzetta Matters, MD [facility] Office: [phone] Pager: [phone]  01/27/2022 9:16 AM

## 2022-01-27 NOTE — Progress Notes (Signed)
PROGRESS NOTE    Morrison Mcbryar  WGN:[id number] DOB: 1951-05-02 DOA: 01/23/2022 PCP: Pcp, No    Brief Narrative:  71 year old with past medical history of hypertension and CKD stage IV presented with chest pain, blood pressure of 228/130 in the emergency room.  He was started on nitroglycerin and subsequently on Cardene drip and admitted to the ICU.  Creatinine was 26 on presentation.  Started on hemodialysis.  After stabilization transferred out of ICU.   Assessment & Plan:   Hypertensive emergency Acute kidney injury on CKD stage IV in the setting of hypertensive emergency now with ESRD.  Severe metabolic acidosis. Hypervolemic hyponatremia Acute hypoxemic respiratory failure secondary to bilateral pleural effusion and suspected aspiration pneumonia.  Initially treated with Cardene infusion and currently taken off. Blood pressures well controlled now on amlodipine 10 mg, hydralazine, Coreg 25 mg twice daily. Patient was started on hemodialysis, tunneled catheter 6/8 and subsequent hemodialysis. Vascular surgery following, planning for AV fistula creation on 6/12. Presumed pneumonia, however improving.  We will continue Rocephin for 5 days. Will need outpatient dialysis.  Deconditioning and frailty: Work with PT OT.  Subsequent may need home health PT OT.   DVT prophylaxis: heparin injection 5,000 Units Start: 01/24/22 0930 SCDs Start: 01/23/22 1729   Code Status: Full code Family Communication: None Disposition Plan: Status is: Inpatient Remains inpatient appropriate because: Active treatment with hemodialysis, inpatient procedures planned     Consultants:  Vascular surgery Nephrology Critical care  Procedures:  Right subclavian permacath, hemodialysis  Antimicrobials:  Rocephin 6/7---   Subjective: Patient was seen and examined.  Denies any chest pain or shortness of breath.  Feels too weak to walk but he feels overall better than last few days.  Remains  afebrile.  Blood pressure well controlled.  Wondering about surgery tomorrow.  Objective: Vitals:   01/27/22 0057 01/27/22 0500 01/27/22 0530 01/27/22 0855  BP: 137/78  137/77 130/80  Pulse: 64  70 71  Resp:   17 18  Temp:   98.4 F (36.9 C) 98.9 F (37.2 C)  TempSrc:   Oral Oral  SpO2:   96% 95%  Weight:  78.4 kg    Height:        Intake/Output Summary (Last 24 hours) at 01/27/2022 1021 Last data filed at 01/27/2022 0557 Gross per 24 hour  Intake 230 ml  Output 2000 ml  Net -1770 ml   Filed Weights   01/24/22 0455 01/26/22 0750 01/27/22 0500  Weight: 72.4 kg 73.6 kg 78.4 kg    Examination:  General exam: Appears calm and comfortable.  Mostly on room air. Respiratory system: No added sounds. Right IJ permacath in place. Cardiovascular system: S1 & S2 heard, RRR. No JVD, murmurs, rubs, gallops or clicks. No pedal edema. Gastrointestinal system: Abdomen is nondistended, soft and nontender. No organomegaly or masses felt. Normal bowel sounds heard. Central nervous system: Alert and oriented. No focal neurological deficits. Extremities: Symmetric 5 x 5 power. Skin: No rashes, lesions or ulcers Psychiatry: Judgement and insight appear normal. Mood & affect appropriate.     Data Reviewed: I have personally reviewed following labs and imaging studies  CBC: Recent Labs  Lab 01/23/22 1515 01/23/22 1527 01/23/22 2147 01/24/22 0017 01/24/22 0611 01/25/22 0122 01/26/22 0258  WBC 10.0  --  12.6* 12.5*  --  13.8* 11.1*  NEUTROABS 8.0*  --   --   --   --   --   --   HGB 8.0*   < > 6.4* 6.9*  7.1* 7.8* 7.7*  HCT 24.2*   < > 19.4* 20.3* 21.0* 22.0* 22.6*  MCV 85.2  --  82.9 81.9  --  82.1 82.2  PLT 132*  --  122* 119*  --  136* 137*   < > = values in this interval not displayed.   Basic Metabolic Panel: Recent Labs  Lab 01/23/22 1850 01/23/22 2147 01/24/22 0017 01/24/22 0611 01/25/22 0122 01/26/22 0258  NA 137 140 138 135 135 132*  K 5.7* 6.4* 4.5 5.1 4.0 4.9  CL  108 106 106  --  98 98  CO2 8* 15* 12*  --  19* 20*  GLUCOSE 128* 160* 172*  --  97 88  BUN 150* 149* 145*  --  83* 108*  CREATININE 27.02* 24.77* 24.40*  --  14.28* 17.93*  CALCIUM 8.9 8.3* 8.8*  --  8.3* 8.2*  MG  --   --  2.2  --  1.7 2.0  PHOS 9.0*  --  9.1*  --  6.3* 8.9*   GFR: Estimated Creatinine Clearance: 4.2 mL/min (A) (by C-G formula based on SCr of 17.93 mg/dL (H)). Liver Function Tests: Recent Labs  Lab 01/23/22 1515 01/23/22 1850  AST 24  --   ALT 14  --   ALKPHOS 81  --   BILITOT 0.9  --   PROT 6.2*  --   ALBUMIN 3.0* 2.9*   Recent Labs  Lab 01/23/22 1515  LIPASE 210*   No results for input(s): "AMMONIA" in the last 168 hours. Coagulation Profile: No results for input(s): "INR", "PROTIME" in the last 168 hours. Cardiac Enzymes: No results for input(s): "CKTOTAL", "CKMB", "CKMBINDEX", "TROPONINI" in the last 168 hours. BNP (last 3 results) No results for input(s): "PROBNP" in the last 8760 hours. HbA1C: No results for input(s): "HGBA1C" in the last 72 hours. CBG: Recent Labs  Lab 01/23/22 1912 01/24/22 0421  GLUCAP 115* 161*   Lipid Profile: No results for input(s): "CHOL", "HDL", "LDLCALC", "TRIG", "CHOLHDL", "LDLDIRECT" in the last 72 hours. Thyroid Function Tests: No results for input(s): "TSH", "T4TOTAL", "FREET4", "T3FREE", "THYROIDAB" in the last 72 hours. Anemia Panel: No results for input(s): "VITAMINB12", "FOLATE", "FERRITIN", "TIBC", "IRON", "RETICCTPCT" in the last 72 hours. Sepsis Labs: Recent Labs  Lab 01/23/22 1649 01/23/22 1850  PROCALCITON  --  0.96  LATICACIDVEN 1.8 2.0*    Recent Results (from the past 240 hour(s))  MRSA Next Gen by PCR, Nasal     Status: None   Collection Time: 01/25/22 11:01 AM   Specimen: Nasal Mucosa; Nasal Swab  Result Value Ref Range Status   MRSA by PCR Next Gen NOT DETECTED NOT DETECTED Final    Comment: (NOTE) The GeneXpert MRSA Assay (FDA approved for NASAL specimens only), is one component  of a comprehensive MRSA colonization surveillance program. It is not intended to diagnose MRSA infection nor to guide or monitor treatment for MRSA infections. Test performance is not FDA approved in patients less than 70 years old. Performed at [facility], [place] [address], [place], [place] [address]          Radiology Studies: VAS [place] UPPER EXT VEIN MAPPING (PRE-OP AVF)  Result Date: 01/26/2022 UPPER EXTREMITY VEIN MAPPING Patient Name:  ROHN FRITSCH  Date of Exam:   01/25/2022 Medical Rec #: [id number]        Accession #:    [id number] Date of Birth: [birth year]        Patient Gender: M Patient Age:  70 years Exam Location:  [facility] Procedure:      VAS [place] UPPER EXT VEIN MAPPING (PRE-OP AVF) Referring Phys: EMMA COLLINS --------------------------------------------------------------------------------  Indications: Pre-access. History: HTN, SMK, CKD4 (now in need of HD).  Limitations: Multiple IVs/tape Comparison Study: No previous exams Performing Technologist: Jody Hill RVT, RDMS  Examination Guidelines: A complete evaluation includes B-mode imaging, spectral Doppler, color Doppler, and power Doppler as needed of all accessible portions of each vessel. Bilateral testing is considered an integral part of a complete examination. Limited examinations for reoccurring indications may be performed as noted. +-----------------+-------------+----------+--------------+ Right Cephalic   Diameter (cm)Depth (cm)   Findings    +-----------------+-------------+----------+--------------+ Shoulder             0.28        0.42                  +-----------------+-------------+----------+--------------+ Prox upper arm       0.31        0.44                  +-----------------+-------------+----------+--------------+ Mid upper arm        0.28        0.23                  +-----------------+-------------+----------+--------------+ Dist upper arm       0.27        0.35                   +-----------------+-------------+----------+--------------+ Antecubital fossa    0.40        0.15      thrombus    +-----------------+-------------+----------+--------------+ Prox forearm         0.35        0.26                  +-----------------+-------------+----------+--------------+ Mid forearm          0.31        0.32                  +-----------------+-------------+----------+--------------+ Dist forearm                            not visualized +-----------------+-------------+----------+--------------+ Wrist                0.18        0.22                  +-----------------+-------------+----------+--------------+ +-----------------+-------------+----------+---------+ Right Basilic    Diameter (cm)Depth (cm)Findings  +-----------------+-------------+----------+---------+ Prox upper arm       0.38                         +-----------------+-------------+----------+---------+ Mid upper arm        0.26                         +-----------------+-------------+----------+---------+ Dist upper arm       0.24                         +-----------------+-------------+----------+---------+ Antecubital fossa    0.27               branching +-----------------+-------------+----------+---------+ Prox forearm         0.16                         +-----------------+-------------+----------+---------+  Mid forearm          0.17                         +-----------------+-------------+----------+---------+ Distal forearm       0.15               branching +-----------------+-------------+----------+---------+ Wrist                0.10                         +-----------------+-------------+----------+---------+ Thrumbus seen in cephalic/AC - site of lab draw +-----------------+-------------+----------+--------------+ Left Cephalic    Diameter (cm)Depth (cm)   Findings    +-----------------+-------------+----------+--------------+ Shoulder              0.21        0.50                  +-----------------+-------------+----------+--------------+ Prox upper arm       0.17        0.32                  +-----------------+-------------+----------+--------------+ Mid upper arm        0.15        0.32                  +-----------------+-------------+----------+--------------+ Dist upper arm       0.22        0.42                  +-----------------+-------------+----------+--------------+ Antecubital fossa    0.27        0.22   IV / thrombus  +-----------------+-------------+----------+--------------+ Prox forearm                            not visualized +-----------------+-------------+----------+--------------+ Mid forearm          0.21        0.43     branching    +-----------------+-------------+----------+--------------+ Dist forearm         0.16        0.31                  +-----------------+-------------+----------+--------------+ Wrist                0.10        0.33                  +-----------------+-------------+----------+--------------+ +-----------------+-------------+----------+---------+ Left Basilic     Diameter (cm)Depth (cm)Findings  +-----------------+-------------+----------+---------+ Prox upper arm       0.39                         +-----------------+-------------+----------+---------+ Mid upper arm        0.34                         +-----------------+-------------+----------+---------+ Dist upper arm       0.29                         +-----------------+-------------+----------+---------+ Antecubital fossa    0.19               branching +-----------------+-------------+----------+---------+ Prox forearm         0.16                         +-----------------+-------------+----------+---------+  Mid forearm          0.18                         +-----------------+-------------+----------+---------+ Distal forearm       0.15                          +-----------------+-------------+----------+---------+ Wrist                0.10                         +-----------------+-------------+----------+---------+ *See table(s) above for measurements and observations.  Diagnosing physician: Servando Snare MD Electronically signed by Servando Snare MD on 01/26/2022 at 9:56:07 AM.    Final         Scheduled Meds:  sodium chloride   Intravenous Once   amLODipine  10 mg Oral Daily   aspirin EC  81 mg Oral Daily   calcium acetate  1,334 mg Oral TID WC   carvedilol  25 mg Oral BID WC   Chlorhexidine Gluconate Cloth  6 each Topical Q0600   docusate sodium  100 mg Oral BID   heparin injection (subcutaneous)  5,000 Units Subcutaneous Q8H   hydrALAZINE  50 mg Oral Q6H   mouth rinse  15 mL Mouth Rinse BID   polyethylene glycol  17 g Oral Daily   sodium bicarbonate  1,300 mg Oral TID   sodium chloride flush  10-40 mL Intracatheter Q12H   Continuous Infusions:  anticoagulant sodium citrate     cefTRIAXone (ROCEPHIN)  IV 2 g (01/27/22 0923)     LOS: 4 days    Time spent: 35 minutes    Barb Merino, MD [facility] Pager [phone]

## 2022-01-27 NOTE — Anesthesia Preprocedure Evaluation (Signed)
Anesthesia Evaluation  Patient identified by MRN, date of birth, ID band Patient awake    Reviewed: Allergy & Precautions, NPO status , Patient's Chart, lab work & pertinent test results, reviewed documented beta blocker date and time   Airway Mallampati: II  TM Distance: >3 FB Neck ROM: Full    Dental  (+) Poor Dentition, Dental Advisory Given   Pulmonary Current Smoker and Patient abstained from smoking.,    Pulmonary exam normal breath sounds clear to auscultation       Cardiovascular hypertension, Pt. on medications Normal cardiovascular exam Rhythm:Regular Rate:Normal     Neuro/Psych negative neurological ROS  negative psych ROS   GI/Hepatic negative GI ROS, Neg liver ROS,   Endo/Other    Renal/GU ESRF and DialysisRenal disease  negative genitourinary   Musculoskeletal negative musculoskeletal ROS (+)   Abdominal   Peds  Hematology  (+) Blood dyscrasia, anemia ,   Anesthesia Other Findings   Reproductive/Obstetrics                            Anesthesia Physical Anesthesia Plan  ASA: 4  Anesthesia Plan: Regional and MAC   Post-op Pain Management: Minimal or no pain anticipated and Precedex   Induction: Intravenous  PONV Risk Score and Plan: 1 and Treatment may vary due to age or medical condition, Ondansetron and Propofol infusion  Airway Management Planned: Natural Airway and Simple Face Mask  Additional Equipment:   Intra-op Plan:   Post-operative Plan: Extubation in OR  Informed Consent: I have reviewed the patients History and Physical, chart, labs and discussed the procedure including the risks, benefits and alternatives for the proposed anesthesia with the patient or authorized representative who has indicated his/her understanding and acceptance.     Dental advisory given  Plan Discussed with: Anesthesiologist and CRNA  Anesthesia Plan Comments:         Anesthesia Quick Evaluation

## 2022-01-27 NOTE — Progress Notes (Signed)
Patient ID: Tanner Rollins, male   DOB: 1951-02-10, 71 y.o.   MRN: [id number] S: Feels weak and tired but otherwise ok. O:BP 130/80 (BP Location: Left Arm)   Pulse 71   Temp 98.9 F (37.2 C) (Oral)   Resp 18   Ht 5' 11.5" (1.816 m)   Wt 78.4 kg   SpO2 95%   BMI 23.77 kg/m   Intake/Output Summary (Last 24 hours) at 01/27/2022 1123 Last data filed at 01/27/2022 0557 Gross per 24 hour  Intake 220 ml  Output 0 ml  Net 220 ml   Intake/Output: I/O last 3 completed shifts: In: 310 [P.O.:200; I.V.:10; IV Piggyback:100] Out: 2000 [Other:2000]  Intake/Output this shift:  No intake/output data recorded. Weight change:  Gen:NAD CVS: RRR Resp: CTA Abd: +BS, soft, NT/ND Ext: no edema  Recent Labs  Lab 01/23/22 1515 01/23/22 1527 01/23/22 1548 01/23/22 1850 01/23/22 2147 01/24/22 0017 01/24/22 0611 01/25/22 0122 01/26/22 0258  NA 137   < > 136 137 140 138 135 135 132*  K 5.0   < > 4.9 5.7* 6.4* 4.5 5.1 4.0 4.9  CL 108  --   --  108 106 106  --  98 98  CO2 9*  --   --  8* 15* 12*  --  19* 20*  GLUCOSE 135*  --   --  128* 160* 172*  --  97 88  BUN 140*  --   --  150* 149* 145*  --  83* 108*  CREATININE 26.11*  --   --  27.02* 24.77* 24.40*  --  14.28* 17.93*  ALBUMIN 3.0*  --   --  2.9*  --   --   --   --   --   CALCIUM 8.9  --   --  8.9 8.3* 8.8*  --  8.3* 8.2*  PHOS  --   --   --  9.0*  --  9.1*  --  6.3* 8.9*  AST 24  --   --   --   --   --   --   --   --   ALT 14  --   --   --   --   --   --   --   --    < > = values in this interval not displayed.   Liver Function Tests: Recent Labs  Lab 01/23/22 1515 01/23/22 1850  AST 24  --   ALT 14  --   ALKPHOS 81  --   BILITOT 0.9  --   PROT 6.2*  --   ALBUMIN 3.0* 2.9*   Recent Labs  Lab 01/23/22 1515  LIPASE 210*   No results for input(s): "AMMONIA" in the last 168 hours. CBC: Recent Labs  Lab 01/23/22 1515 01/23/22 1527 01/23/22 2147 01/24/22 0017 01/24/22 0611 01/25/22 0122 01/26/22 0258  WBC 10.0  --   12.6* 12.5*  --  13.8* 11.1*  NEUTROABS 8.0*  --   --   --   --   --   --   HGB 8.0*   < > 6.4* 6.9* 7.1* 7.8* 7.7*  HCT 24.2*   < > 19.4* 20.3* 21.0* 22.0* 22.6*  MCV 85.2  --  82.9 81.9  --  82.1 82.2  PLT 132*  --  122* 119*  --  136* 137*   < > = values in this interval not displayed.   Cardiac Enzymes: No results for input(s): "CKTOTAL", "CKMB", "CKMBINDEX", "TROPONINI"  in the last 168 hours. CBG: Recent Labs  Lab 01/23/22 1912 01/24/22 0421  GLUCAP 115* 161*    Iron Studies: No results for input(s): "IRON", "TIBC", "TRANSFERRIN", "FERRITIN" in the last 72 hours. Studies/Results: VAS [place] UPPER EXT VEIN MAPPING (PRE-OP AVF)  Result Date: 01/26/2022 UPPER EXTREMITY VEIN MAPPING Patient Name:  Tanner Rollins  Date of Exam:   01/25/2022 Medical Rec #: [id number]        Accession #:    [id number] Date of Birth: 1951-01-01        Patient Gender: M Patient Age:   72 years Exam Location:  [facility] Procedure:      VAS [place] UPPER EXT VEIN MAPPING (PRE-OP AVF) Referring Phys: EMMA COLLINS --------------------------------------------------------------------------------  Indications: Pre-access. History: HTN, SMK, CKD4 (now in need of HD).  Limitations: Multiple IVs/tape Comparison Study: No previous exams Performing Technologist: Jody Hill RVT, RDMS  Examination Guidelines: A complete evaluation includes B-mode imaging, spectral Doppler, color Doppler, and power Doppler as needed of all accessible portions of each vessel. Bilateral testing is considered an integral part of a complete examination. Limited examinations for reoccurring indications may be performed as noted. +-----------------+-------------+----------+--------------+ Right Cephalic   Diameter (cm)Depth (cm)   Findings    +-----------------+-------------+----------+--------------+ Shoulder             0.28        0.42                  +-----------------+-------------+----------+--------------+ Prox upper arm       0.31         0.44                  +-----------------+-------------+----------+--------------+ Mid upper arm        0.28        0.23                  +-----------------+-------------+----------+--------------+ Dist upper arm       0.27        0.35                  +-----------------+-------------+----------+--------------+ Antecubital fossa    0.40        0.15      thrombus    +-----------------+-------------+----------+--------------+ Prox forearm         0.35        0.26                  +-----------------+-------------+----------+--------------+ Mid forearm          0.31        0.32                  +-----------------+-------------+----------+--------------+ Dist forearm                            not visualized +-----------------+-------------+----------+--------------+ Wrist                0.18        0.22                  +-----------------+-------------+----------+--------------+ +-----------------+-------------+----------+---------+ Right Basilic    Diameter (cm)Depth (cm)Findings  +-----------------+-------------+----------+---------+ Prox upper arm       0.38                         +-----------------+-------------+----------+---------+ Mid upper arm        0.26                         +-----------------+-------------+----------+---------+  Dist upper arm       0.24                         +-----------------+-------------+----------+---------+ Antecubital fossa    0.27               branching +-----------------+-------------+----------+---------+ Prox forearm         0.16                         +-----------------+-------------+----------+---------+ Mid forearm          0.17                         +-----------------+-------------+----------+---------+ Distal forearm       0.15               branching +-----------------+-------------+----------+---------+ Wrist                0.10                          +-----------------+-------------+----------+---------+ Thrumbus seen in cephalic/AC - site of lab draw +-----------------+-------------+----------+--------------+ Left Cephalic    Diameter (cm)Depth (cm)   Findings    +-----------------+-------------+----------+--------------+ Shoulder             0.21        0.50                  +-----------------+-------------+----------+--------------+ Prox upper arm       0.17        0.32                  +-----------------+-------------+----------+--------------+ Mid upper arm        0.15        0.32                  +-----------------+-------------+----------+--------------+ Dist upper arm       0.22        0.42                  +-----------------+-------------+----------+--------------+ Antecubital fossa    0.27        0.22   IV / thrombus  +-----------------+-------------+----------+--------------+ Prox forearm                            not visualized +-----------------+-------------+----------+--------------+ Mid forearm          0.21        0.43     branching    +-----------------+-------------+----------+--------------+ Dist forearm         0.16        0.31                  +-----------------+-------------+----------+--------------+ Wrist                0.10        0.33                  +-----------------+-------------+----------+--------------+ +-----------------+-------------+----------+---------+ Left Basilic     Diameter (cm)Depth (cm)Findings  +-----------------+-------------+----------+---------+ Prox upper arm       0.39                         +-----------------+-------------+----------+---------+ Mid upper arm        0.34                         +-----------------+-------------+----------+---------+  Dist upper arm       0.29                         +-----------------+-------------+----------+---------+ Antecubital fossa    0.19               branching  +-----------------+-------------+----------+---------+ Prox forearm         0.16                         +-----------------+-------------+----------+---------+ Mid forearm          0.18                         +-----------------+-------------+----------+---------+ Distal forearm       0.15                         +-----------------+-------------+----------+---------+ Wrist                0.10                         +-----------------+-------------+----------+---------+ *See table(s) above for measurements and observations.  Diagnosing physician: Servando Snare MD Electronically signed by Servando Snare MD on 01/26/2022 at 9:56:07 AM.    Final     sodium chloride   Intravenous Once   amLODipine  10 mg Oral Daily   aspirin EC  81 mg Oral Daily   calcium acetate  1,334 mg Oral TID WC   carvedilol  25 mg Oral BID WC   Chlorhexidine Gluconate Cloth  6 each Topical Q0600   docusate sodium  100 mg Oral BID   heparin injection (subcutaneous)  5,000 Units Subcutaneous Q8H   hydrALAZINE  50 mg Oral Q6H   mouth rinse  15 mL Mouth Rinse BID   polyethylene glycol  17 g Oral Daily   sodium bicarbonate  1,300 mg Oral TID   sodium chloride flush  10-40 mL Intracatheter Q12H    BMET    Component Value Date/Time   NA 132 (L) 01/26/2022 0258   K 4.9 01/26/2022 0258   CL 98 01/26/2022 0258   CO2 20 (L) 01/26/2022 0258   GLUCOSE 88 01/26/2022 0258   BUN 108 (H) 01/26/2022 0258   CREATININE 17.93 (H) 01/26/2022 0258   CALCIUM 8.2 (L) 01/26/2022 0258   GFRNONAA 3 (L) 01/26/2022 0258   CBC    Component Value Date/Time   WBC 11.1 (H) 01/26/2022 0258   RBC 2.75 (L) 01/26/2022 0258   HGB 7.7 (L) 01/26/2022 0258   HCT 22.6 (L) 01/26/2022 0258   PLT 137 (L) 01/26/2022 0258   MCV 82.2 01/26/2022 0258   MCH 28.0 01/26/2022 0258   MCHC 34.1 01/26/2022 0258   RDW 16.4 (H) 01/26/2022 0258   LYMPHSABS 1.1 01/23/2022 1515   MONOABS 0.8 01/23/2022 1515   EOSABS 0.0 01/23/2022 1515   BASOSABS  0.0 01/23/2022 1515    Assessment/Plan:   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  We discussed the severity and chronicity of his CKD and he has a strong family history of ESRD.  Renal US consistent with chronic medical renal disease.  No obstruction or hydro.  Tunneled HD catheter placed 01/24/22 and first HD session afterwards and had second today.  Will plan for HD again on Monday, second  shift after he has his AVF creation by Dr. Scot Dock. Hypertensive emergency - markedly improved with Cardene gtt.  Appreciate PCCM's assistance.  Now off of cardene gtt and on amlodipine 10 mg, carvedilol 25 mg bid, hydralazine 50 mg q6. Hyperkalemia - improved with lasix and lokelma. Acute hypoxic respiratory failure - wean supplemental oxygen.  CXR with bilateral lower lobe airspace disease worrisome for infection.  Small left pleural effusion, moderate right pleural effusion.  Antibiotics per PCCM.  Will UF with next HD session but may need thoracentesis.  ECHO with normal EF, Grade I DD, elevated LVEDP and pulmonary HTN. Anemia of CKD stage V - iron 37, TSAT 19%, ferritin 654.  S/p blood transfusion.  Will start IV iron and ESA and follow. 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  iPTH pending but with low calcium, he will likely require vit D. Vascular access- [facility] 01/24/22 and to have AVF/AVG placement 01/28/22 at 7:30 am by Dr. Scot Dock.  Disposition - he will need outpatient HD arrangements prior to discharge as he is likely ESRD.  Donetta Potts, MD [facility]

## 2022-01-27 NOTE — Progress Notes (Signed)
  Progress Note    01/27/2022 9:16 AM * No surgery date entered *  Subjective: Feeling much better out of the ICU  Vitals:   01/27/22 0530 01/27/22 0855  BP: 137/77 130/80  Pulse: 70 71  Resp: 17 18  Temp: 98.4 F (36.9 C) 98.9 F (37.2 C)  SpO2: 96% 95%    Physical Exam: Awake alert oriented Nonlabored respirations Left brachial artery is 3+ palpable There is a forearm IV in the left he also has an IV on the right  CBC    Component Value Date/Time   WBC 11.1 (H) 01/26/2022 0258   RBC 2.75 (L) 01/26/2022 0258   HGB 7.7 (L) 01/26/2022 0258   HCT 22.6 (L) 01/26/2022 0258   PLT 137 (L) 01/26/2022 0258   MCV 82.2 01/26/2022 0258   MCH 28.0 01/26/2022 0258   MCHC 34.1 01/26/2022 0258   RDW 16.4 (H) 01/26/2022 0258   LYMPHSABS 1.1 01/23/2022 1515   MONOABS 0.8 01/23/2022 1515   EOSABS 0.0 01/23/2022 1515   BASOSABS 0.0 01/23/2022 1515    BMET    Component Value Date/Time   NA 132 (L) 01/26/2022 0258   K 4.9 01/26/2022 0258   CL 98 01/26/2022 0258   CO2 20 (L) 01/26/2022 0258   GLUCOSE 88 01/26/2022 0258   BUN 108 (H) 01/26/2022 0258   CREATININE 17.93 (H) 01/26/2022 0258   CALCIUM 8.2 (L) 01/26/2022 0258   GFRNONAA 3 (L) 01/26/2022 0258    INR No results found for: "INR"   Intake/Output Summary (Last 24 hours) at 01/27/2022 0916 Last data filed at 01/27/2022 0557 Gross per 24 hour  Intake 230 ml  Output 2000 ml  Net -1770 ml     Assessment:  71 y.o. male is now end-stage renal disease on dialysis via catheter.  Plan: OR tomorrow first case with Dr. Lou Cal for left arm AV fistula versus graft.  I discussed the risk benefits alternatives as well as the need for maturity and possible second operation for revision particularly if basilic vein fistula was created.  N.p.o. past midnight   Alandria Butkiewicz C. Donzetta Matters, MD [facility] Office: [phone] Pager: [phone]  01/27/2022 9:16 AM

## 2022-01-28 ENCOUNTER — Encounter (HOSPITAL_COMMUNITY)
Admission: EM | Disposition: A | Discharge status: Skilled Nursing Facility | Payer: Self-pay | Source: Home / Self Care | Attending: Internal Medicine

## 2022-01-28 ENCOUNTER — Other Ambulatory Visit: Payer: Self-pay

## 2022-01-28 ENCOUNTER — Inpatient Hospital Stay (HOSPITAL_COMMUNITY): Payer: Medicare Other | Admitting: Anesthesiology

## 2022-01-28 ENCOUNTER — Inpatient Hospital Stay (HOSPITAL_COMMUNITY): Payer: Medicare Other

## 2022-01-28 DIAGNOSIS — D631 Anemia in chronic kidney disease: Secondary | ICD-10-CM

## 2022-01-28 DIAGNOSIS — I120 Hypertensive chronic kidney disease with stage 5 chronic kidney disease or end stage renal disease: Secondary | ICD-10-CM

## 2022-01-28 DIAGNOSIS — N186 End stage renal disease: Secondary | ICD-10-CM

## 2022-01-28 DIAGNOSIS — Z992 Dependence on renal dialysis: Secondary | ICD-10-CM

## 2022-01-28 HISTORY — PX: AV FISTULA PLACEMENT: SHX1204

## 2022-01-28 LAB — CBC
HCT: 22.5 % — ABNORMAL LOW (ref 39.0–52.0)
Hemoglobin: 7.4 g/dL — ABNORMAL LOW (ref 13.0–17.0)
MCH: 28.2 pg (ref 26.0–34.0)
MCHC: 32.9 g/dL (ref 30.0–36.0)
MCV: 85.9 fL (ref 80.0–100.0)
Platelets: 217 10*3/uL (ref 150–400)
RBC: 2.62 MIL/uL — ABNORMAL LOW (ref 4.22–5.81)
RDW: 16 % — ABNORMAL HIGH (ref 11.5–15.5)
WBC: 8.4 10*3/uL (ref 4.0–10.5)
nRBC: 0 % (ref 0.0–0.2)

## 2022-01-28 LAB — SURGICAL PCR SCREEN
MRSA, PCR: NEGATIVE
Staphylococcus aureus: NEGATIVE

## 2022-01-28 LAB — BASIC METABOLIC PANEL
Anion gap: 15 (ref 5–15)
BUN: 86 mg/dL — ABNORMAL HIGH (ref 8–23)
CO2: 23 mmol/L (ref 22–32)
Calcium: 8.4 mg/dL — ABNORMAL LOW (ref 8.9–10.3)
Chloride: 96 mmol/L — ABNORMAL LOW (ref 98–111)
Creatinine, Ser: 15.36 mg/dL — ABNORMAL HIGH (ref 0.61–1.24)
GFR, Estimated: 3 mL/min — ABNORMAL LOW (ref >60)
Glucose, Bld: 95 mg/dL (ref 70–99)
Potassium: 4.1 mmol/L (ref 3.5–5.1)
Sodium: 134 mmol/L — ABNORMAL LOW (ref 135–145)

## 2022-01-28 SURGERY — ARTERIOVENOUS (AV) FISTULA CREATION
Anesthesia: Monitor Anesthesia Care | Site: Arm Upper | Laterality: Left

## 2022-01-28 MED ORDER — EPHEDRINE SULFATE-NACL 50-0.9 MG/10ML-% IV SOSY
PREFILLED_SYRINGE | INTRAVENOUS | Status: DC | PRN
  Administered 2022-01-28: 5 mg via INTRAVENOUS

## 2022-01-28 MED ORDER — FENTANYL CITRATE (PF) 100 MCG/2ML IJ SOLN: 25.0000 ug | INTRAMUSCULAR | Status: DC | PRN

## 2022-01-28 MED ORDER — PROPOFOL 500 MG/50ML IV EMUL
INTRAVENOUS | Status: DC | PRN
  Administered 2022-01-28: 50 ug/kg/min via INTRAVENOUS

## 2022-01-28 MED ORDER — LIDOCAINE 2% (20 MG/ML) 5 ML SYRINGE
INTRAMUSCULAR | Status: DC | PRN
  Administered 2022-01-28: 100 mg via INTRAVENOUS

## 2022-01-28 MED ORDER — HEPARIN SODIUM (PORCINE) 1000 UNIT/ML IJ SOLN
INTRAMUSCULAR | Status: AC
  Filled 2022-01-28: qty 4

## 2022-01-28 MED ORDER — FENTANYL CITRATE (PF) 250 MCG/5ML IJ SOLN
INTRAMUSCULAR | Status: AC
  Filled 2022-01-28: qty 5

## 2022-01-28 MED ORDER — HEPARIN SODIUM (PORCINE) 1000 UNIT/ML IJ SOLN
INTRAMUSCULAR | Status: DC | PRN
  Administered 2022-01-28: 7000 [IU] via INTRAVENOUS

## 2022-01-28 MED ORDER — PROPOFOL 10 MG/ML IV BOLUS
INTRAVENOUS | Status: AC
  Filled 2022-01-28: qty 20

## 2022-01-28 MED ORDER — ROPIVACAINE HCL 5 MG/ML IJ SOLN
INTRAMUSCULAR | Status: DC | PRN
  Administered 2022-01-28: 30 mL via PERINEURAL

## 2022-01-28 MED ORDER — 0.9 % SODIUM CHLORIDE (POUR BTL) OPTIME
TOPICAL | Status: DC | PRN
  Administered 2022-01-28: 1000 mL

## 2022-01-28 MED ORDER — MIDAZOLAM HCL 2 MG/2ML IJ SOLN
INTRAMUSCULAR | Status: AC
  Filled 2022-01-28: qty 2

## 2022-01-28 MED ORDER — LIDOCAINE HCL (PF) 1 % IJ SOLN
INTRAMUSCULAR | Status: AC
Start: 2022-01-28 — End: ?
  Filled 2022-01-28: qty 30

## 2022-01-28 MED ORDER — FENTANYL CITRATE (PF) 100 MCG/2ML IJ SOLN
INTRAMUSCULAR | Status: DC | PRN
  Administered 2022-01-28: 25 ug via INTRAVENOUS
  Administered 2022-01-28: 50 ug via INTRAVENOUS
  Administered 2022-01-28: 25 ug via INTRAVENOUS

## 2022-01-28 MED ORDER — ONDANSETRON HCL 4 MG/2ML IJ SOLN: 4.0000 mg | Freq: Once | INTRAMUSCULAR | Status: DC | PRN

## 2022-01-28 MED ORDER — LIDOCAINE-EPINEPHRINE (PF) 1 %-1:200000 IJ SOLN
INTRAMUSCULAR | Status: AC
  Filled 2022-01-28: qty 30

## 2022-01-28 MED ORDER — PAPAVERINE HCL 30 MG/ML IJ SOLN
INTRAMUSCULAR | Status: AC
  Filled 2022-01-28: qty 2

## 2022-01-28 MED ORDER — FENTANYL CITRATE PF 50 MCG/ML IJ SOSY
25.0000 ug | PREFILLED_SYRINGE | INTRAMUSCULAR | Status: DC | PRN
  Administered 2022-01-28: 25 ug via INTRAVENOUS
  Filled 2022-01-28: qty 1

## 2022-01-28 MED ORDER — PROTAMINE SULFATE 10 MG/ML IV SOLN
INTRAVENOUS | Status: DC | PRN
  Administered 2022-01-28: 5 mg via INTRAVENOUS
  Administered 2022-01-28 (×3): 10 mg via INTRAVENOUS
  Administered 2022-01-28: 5 mg via INTRAVENOUS

## 2022-01-28 MED ORDER — MIDAZOLAM HCL 5 MG/5ML IJ SOLN
INTRAMUSCULAR | Status: DC | PRN
  Administered 2022-01-28: 2 mg via INTRAVENOUS

## 2022-01-28 MED ORDER — PROPOFOL 10 MG/ML IV BOLUS
INTRAVENOUS | Status: DC | PRN
  Administered 2022-01-28: 30 mg via INTRAVENOUS

## 2022-01-28 MED ORDER — SODIUM CHLORIDE 0.9 % IV SOLN: INTRAVENOUS | Status: DC | PRN

## 2022-01-28 MED ORDER — OXYCODONE HCL 5 MG PO TABS: 5.0000 mg | ORAL_TABLET | Freq: Once | ORAL | Status: DC | PRN

## 2022-01-28 MED ORDER — STERILE WATER FOR IRRIGATION IR SOLN
Status: DC | PRN
  Administered 2022-01-28: 1000 mL

## 2022-01-28 MED ORDER — HEPARIN 6000 UNIT IRRIGATION SOLUTION
Status: DC | PRN
  Administered 2022-01-28: 1

## 2022-01-28 MED ORDER — HEPARIN 6000 UNIT IRRIGATION SOLUTION
Status: AC
  Filled 2022-01-28: qty 500

## 2022-01-28 MED ORDER — OXYCODONE-ACETAMINOPHEN 5-325 MG PO TABS
1.0000 | ORAL_TABLET | Freq: Four times a day (QID) | ORAL | Status: DC | PRN
  Administered 2022-01-28 – 2022-01-29 (×4): 1 via ORAL
  Filled 2022-01-28 (×5): qty 1

## 2022-01-28 MED ORDER — ONDANSETRON HCL 4 MG/2ML IJ SOLN
INTRAMUSCULAR | Status: AC
  Administered 2022-01-28: 4 mg via INTRAVENOUS
  Filled 2022-01-28: qty 2

## 2022-01-28 MED ORDER — ONDANSETRON HCL 4 MG/2ML IJ SOLN: 4.0000 mg | Freq: Four times a day (QID) | INTRAMUSCULAR | Status: DC | PRN

## 2022-01-28 MED ORDER — OXYCODONE HCL 5 MG/5ML PO SOLN: 5.0000 mg | Freq: Once | ORAL | Status: DC | PRN

## 2022-01-28 SURGICAL SUPPLY — 52 items
ADH SKN CLS APL DERMABOND .7 (GAUZE/BANDAGES/DRESSINGS) ×2
ADH SKN CLS LQ APL DERMABOND (GAUZE/BANDAGES/DRESSINGS) ×1
ARMBAND PINK RESTRICT EXTREMIT (MISCELLANEOUS) ×4 IMPLANT
BAG COUNTER SPONGE SURGICOUNT (BAG) ×2 IMPLANT
BAG SPNG CNTER NS LX DISP (BAG) ×1
BNDG CMPR STD VLCR NS LF 5.8X4 (GAUZE/BANDAGES/DRESSINGS) ×1
BNDG ELASTIC 4X5.8 VLCR NS LF (GAUZE/BANDAGES/DRESSINGS) ×1 IMPLANT
BNDG GAUZE DERMACEA FLUFF (GAUZE/BANDAGES/DRESSINGS) ×1
BNDG GAUZE DERMACEA FLUFF 4 (GAUZE/BANDAGES/DRESSINGS) IMPLANT
BNDG GZE DERMACEA 4 6PLY (GAUZE/BANDAGES/DRESSINGS) ×1
CANISTER SUCT 3000ML PPV (MISCELLANEOUS) ×2 IMPLANT
CANNULA VESSEL 3MM 2 BLNT TIP (CANNULA) ×2 IMPLANT
CLIP TI WIDE RED SMALL 6 (CLIP) ×4 IMPLANT
CLIP VESOCCLUDE MED 6/CT (CLIP) ×2 IMPLANT
CLIP VESOCCLUDE SM WIDE 6/CT (CLIP) ×2 IMPLANT
COVER PROBE W GEL 5X96 (DRAPES) ×1 IMPLANT
DERMABOND ADHESIVE PROPEN (GAUZE/BANDAGES/DRESSINGS) ×1
DERMABOND ADVANCED (GAUZE/BANDAGES/DRESSINGS) ×2
DERMABOND ADVANCED .7 DNX12 (GAUZE/BANDAGES/DRESSINGS) ×1 IMPLANT
DERMABOND ADVANCED .7 DNX6 (GAUZE/BANDAGES/DRESSINGS) IMPLANT
ELECT REM PT RETURN 9FT ADLT (ELECTROSURGICAL) ×2
ELECTRODE REM PT RTRN 9FT ADLT (ELECTROSURGICAL) ×1 IMPLANT
GAUZE 4X4 16PLY ~~LOC~~+RFID DBL (SPONGE) ×1 IMPLANT
GAUZE SPONGE 4X4 12PLY STRL (GAUZE/BANDAGES/DRESSINGS) ×1 IMPLANT
GLOVE BIO SURGEON STRL SZ 6.5 (GLOVE) ×2 IMPLANT
GLOVE BIO SURGEON STRL SZ7.5 (GLOVE) ×3 IMPLANT
GLOVE BIOGEL PI IND STRL 6 (GLOVE) IMPLANT
GLOVE BIOGEL PI IND STRL 8 (GLOVE) ×1 IMPLANT
GLOVE BIOGEL PI INDICATOR 6 (GLOVE) ×1
GLOVE BIOGEL PI INDICATOR 8 (GLOVE) ×1
GLOVE SURG UNDER LTX SZ8 (GLOVE) ×2 IMPLANT
GOWN STRL REUS W/ TWL LRG LVL3 (GOWN DISPOSABLE) ×3 IMPLANT
GOWN STRL REUS W/TWL LRG LVL3 (GOWN DISPOSABLE) ×6
KIT BASIN OR (CUSTOM PROCEDURE TRAY) ×2 IMPLANT
KIT TURNOVER KIT B (KITS) ×2 IMPLANT
NS IRRIG 1000ML POUR BTL (IV SOLUTION) ×2 IMPLANT
PACK CV ACCESS (CUSTOM PROCEDURE TRAY) ×2 IMPLANT
PAD ARMBOARD 7.5X6 YLW CONV (MISCELLANEOUS) ×4 IMPLANT
SLING ARM FOAM STRAP LRG (SOFTGOODS) ×1 IMPLANT
SPIKE FLUID TRANSFER (MISCELLANEOUS) ×2 IMPLANT
SPONGE T-LAP 18X18 ~~LOC~~+RFID (SPONGE) ×1 IMPLANT
SUT MNCRL AB 4-0 PS2 18 (SUTURE) ×3 IMPLANT
SUT PROLENE 6 0 BV (SUTURE) ×2 IMPLANT
SUT SILK 2 0 SH (SUTURE) ×1 IMPLANT
SUT SILK 3 0 (SUTURE) ×2
SUT SILK 3-0 18XBRD TIE 12 (SUTURE) IMPLANT
SUT VIC AB 3-0 SH 27 (SUTURE) ×4
SUT VIC AB 3-0 SH 27X BRD (SUTURE) ×1 IMPLANT
TAPE UMBILICAL 1/8X18 (MISCELLANEOUS) ×1 IMPLANT
TOWEL GREEN STERILE (TOWEL DISPOSABLE) ×2 IMPLANT
UNDERPAD 30X36 HEAVY ABSORB (UNDERPADS AND DIAPERS) ×2 IMPLANT
WATER STERILE IRR 1000ML POUR (IV SOLUTION) ×2 IMPLANT

## 2022-01-28 NOTE — Progress Notes (Signed)
Patient ID: Tanner Rollins, male   DOB: 07/25/1951, 71 y.o.   MRN: [id number]  Assessment/Plan:   ESRD - already advanced CKD stage IV secondary to poorly controlled HTN for the past year and now progressed to ESRD.  Strong family history of ESRD.  Renal US consistent with chronic medical renal disease.  No obstruction or hydro.  Tunneled HD catheter placed 01/24/22 and first HD session afterwards and had second today.  Appreciate single stage lt BBT by Dr. Scot Dock today; planning on HD second shift today and then resume MWF.  Renal navigator aware as well. Hypertensive emergency - markedly improved with Cardene gtt.  Appreciate PCCM's assistance.  Now off of cardene gtt and on amlodipine 10 mg, carvedilol 25 mg bid, hydralazine 50 mg q6. Hyperkalemia - improved with lasix and lokelma. Acute hypoxic respiratory failure - wean supplemental oxygen.  CXR with bilateral lower lobe airspace disease worrisome for infection.  Small left pleural effusion, moderate right pleural effusion.  Antibiotics per PCCM.  Will UF with next HD session but may need thoracentesis.  ECHO with normal EF, Grade I DD, elevated LVEDP and pulmonary HTN. Anemia of CKD stage V - iron 37, TSAT 19%, ferritin 654.  S/p blood transfusion.   Started IV iron and ESA and follow. 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  iPTH pending but with low calcium, he will likely require vit D. Will f/u on iPTH and start on calcitriol 0.25 MWF. Vascular access- [facility] 01/24/22 and lt BBT (single stage) placement 01/28/22 by Dr. Scot Dock.  Disposition - he will need outpatient HD arrangements prior to discharge as he is likely ESRD.  S: Feels weak and tired, nauseous as well   O:BP (!) 147/83   Pulse 62   Temp 98.6 F (37 C)   Resp 16   Ht 5' 11.5" (1.816 m)   Wt 78.6 kg   SpO2 96%   BMI 23.83 kg/m   Intake/Output Summary (Last 24 hours) at 01/28/2022 1356 Last data filed at 01/28/2022 0916 Gross per 24  hour  Intake 960 ml  Output 3 ml  Net 957 ml   Intake/Output: I/O last 3 completed shifts: In: 1160 [P.O.:960; IV Piggyback:200] Out: 3 [Blood:3]  Intake/Output this shift:  Total I/O In: 500 [I.V.:500] Out: -  Weight change: 5 kg Gen:NAD CVS: RRR Resp: CTA Abd: +BS, soft, NT/ND Ext: no edema ACCESS: lt BBT w/ good bruit, [facility]  Recent Labs  Lab 01/23/22 1515 01/23/22 1527 01/23/22 1850 01/23/22 2147 01/24/22 0017 01/24/22 0611 01/25/22 0122 01/26/22 0258 01/28/22 0436  NA 137   < > 137 140 138 135 135 132* 134*  K 5.0   < > 5.7* 6.4* 4.5 5.1 4.0 4.9 4.1  CL 108  --  108 106 106  --  98 98 96*  CO2 9*  --  8* 15* 12*  --  19* 20* 23  GLUCOSE 135*  --  128* 160* 172*  --  97 88 95  BUN 140*  --  150* 149* 145*  --  83* 108* 86*  CREATININE 26.11*  --  27.02* 24.77* 24.40*  --  14.28* 17.93* 15.36*  ALBUMIN 3.0*  --  2.9*  --   --   --   --   --   --   CALCIUM 8.9  --  8.9 8.3* 8.8*  --  8.3* 8.2* 8.4*  PHOS  --   --  9.0*  --  9.1*  --  6.3* 8.9*  --   AST 24  --   --   --   --   --   --   --   --   ALT 14  --   --   --   --   --   --   --   --    < > = values in this interval not displayed.   Liver Function Tests: Recent Labs  Lab 01/23/22 1515 01/23/22 1850  AST 24  --   ALT 14  --   ALKPHOS 81  --   BILITOT 0.9  --   PROT 6.2*  --   ALBUMIN 3.0* 2.9*   Recent Labs  Lab 01/23/22 1515  LIPASE 210*   No results for input(s): "AMMONIA" in the last 168 hours. CBC: Recent Labs  Lab 01/23/22 1515 01/23/22 1527 01/23/22 2147 01/24/22 0017 01/24/22 0611 01/25/22 0122 01/26/22 0258 01/28/22 0436  WBC 10.0  --  12.6* 12.5*  --  13.8* 11.1* 8.4  NEUTROABS 8.0*  --   --   --   --   --   --   --   HGB 8.0*   < > 6.4* 6.9*   < > 7.8* 7.7* 7.4*  HCT 24.2*   < > 19.4* 20.3*   < > 22.0* 22.6* 22.5*  MCV 85.2  --  82.9 81.9  --  82.1 82.2 85.9  PLT 132*  --  122* 119*  --  136* 137* 217   < > = values in this interval not displayed.   Cardiac  Enzymes: No results for input(s): "CKTOTAL", "CKMB", "CKMBINDEX", "TROPONINI" in the last 168 hours. CBG: Recent Labs  Lab 01/23/22 1912 01/24/22 0421  GLUCAP 115* 161*    Iron Studies: No results for input(s): "IRON", "TIBC", "TRANSFERRIN", "FERRITIN" in the last 72 hours. Studies/Results: DG CHEST PORT 1 VIEW  Result Date: 01/28/2022 CLINICAL DATA:  Shortness of breath. EXAM: PORTABLE CHEST 1 VIEW COMPARISON:  01/25/2022. FINDINGS: 4:39 a.m., 01/29/2019. Right IJ dialysis catheter tip remains in the upper right atrium. The heart is enlarged. Mild central vascular prominence with slight basilar interstitial edema continue to be noted with moderate pleural effusions. There are opacities in the lung bases which again could represent atelectasis, or patchy consolidation such as due to pneumonia, aspiration or combination. The mid and upper lung fields are clear with COPD change. There is aortic atherosclerosis and tortuosity. Either the pleural effusions are mildly improved or redistributed. In all other respects there are no further changes. IMPRESSION: There is mild improvement in aeration of the bases but there are still patchy consolidative or atelectatic opacities overlying moderate pleural effusions. The pleural effusions may have mildly improved versus redistributed. Perihilar vascular congestion and slight basal interstitial edema appear similar. Electronically Signed   By: Telford Nab M.D.   On: 01/28/2022 06:36     sodium chloride   Intravenous Once   amLODipine  10 mg Oral Daily   aspirin EC  81 mg Oral Daily   calcium acetate  1,334 mg Oral TID WC   carvedilol  25 mg Oral BID WC   Chlorhexidine Gluconate Cloth  6 each Topical Q0600   docusate sodium  100 mg Oral BID   heparin injection (subcutaneous)  5,000 Units Subcutaneous Q8H   hydrALAZINE  50 mg Oral Q6H   mouth rinse  15 mL Mouth Rinse BID   polyethylene glycol  17 g Oral Daily  sodium bicarbonate  1,300 mg Oral TID    sodium chloride flush  10-40 mL Intracatheter Q12H    BMET    Component Value Date/Time   NA 134 (L) 01/28/2022 0436   K 4.1 01/28/2022 0436   CL 96 (L) 01/28/2022 0436   CO2 23 01/28/2022 0436   GLUCOSE 95 01/28/2022 0436   BUN 86 (H) 01/28/2022 0436   CREATININE 15.36 (H) 01/28/2022 0436   CALCIUM 8.4 (L) 01/28/2022 0436   GFRNONAA 3 (L) 01/28/2022 0436   CBC    Component Value Date/Time   WBC 8.4 01/28/2022 0436   RBC 2.62 (L) 01/28/2022 0436   HGB 7.4 (L) 01/28/2022 0436   HCT 22.5 (L) 01/28/2022 0436   PLT 217 01/28/2022 0436   MCV 85.9 01/28/2022 0436   MCH 28.2 01/28/2022 0436   MCHC 32.9 01/28/2022 0436   RDW 16.0 (H) 01/28/2022 0436   LYMPHSABS 1.1 01/23/2022 1515   MONOABS 0.8 01/23/2022 1515   EOSABS 0.0 01/23/2022 1515   BASOSABS 0.0 01/23/2022 1515

## 2022-01-28 NOTE — Anesthesia Procedure Notes (Signed)
Anesthesia Regional Block: Interscalene brachial plexus block   Pre-Anesthetic Checklist: , timeout performed,  Correct Patient, Correct Site, Correct Laterality,  Correct Procedure, Correct Position, site marked,  Risks and benefits discussed,  Surgical consent,  Pre-op evaluation,  At surgeon's request and post-op pain management  Laterality: Left  Prep: chloraprep       Needles:  Injection technique: Single-shot  Needle Type: Echogenic Stimulator Needle     Needle Length: 10cm  Needle Gauge: 21   Needle insertion depth: 7 cm   Additional Needles:   Procedures:,,,, ultrasound used (permanent image in chart),,    Narrative:  Start time: 01/28/2022 7:14 AM End time: 01/28/2022 7:19 AM Injection made incrementally with aspirations every 5 mL.  Performed by: Personally  Anesthesiologist: Josephine Igo, MD  Additional Notes: Timeout performed. Patient sedated. Relevant anatomy ID'd using [place]. Incremental 2-38ml injection of LA with frequent aspiration. Patient tolerated procedure well.     Left Interscalene Block

## 2022-01-28 NOTE — Transfer of Care (Signed)
Immediate Anesthesia Transfer of Care Note  Patient: Tanner Rollins  Procedure(s) Performed: ARTERIOVENOUS (AV) FISTULA CREATION VS.GRAFT ARM (Left: Arm Upper)  Patient Location: PACU  Anesthesia Type:MAC  Level of Consciousness: drowsy and patient cooperative  Airway & Oxygen Therapy: Patient Spontanous Breathing and Patient connected to nasal cannula oxygen  Post-op Assessment: Report given to RN and Post -op Vital signs reviewed and stable  Post vital signs: Reviewed and stable  Last Vitals:  Vitals Value Taken Time  BP 142/69 01/28/22 1000  Temp    Pulse 71 01/28/22 1002  Resp 15 01/28/22 1002  SpO2 95 % 01/28/22 1002  Vitals shown include unvalidated device data.  Last Pain:  Vitals:   01/28/22 0433  TempSrc: Oral  PainSc:          Complications: No notable events documented.

## 2022-01-28 NOTE — Anesthesia Postprocedure Evaluation (Signed)
Anesthesia Post Note  Patient: Tanner Rollins  Procedure(s) Performed: ARTERIOVENOUS (AV) FISTULA CREATION VS.GRAFT ARM (Left: Arm Upper)     Patient location during evaluation: PACU Anesthesia Type: Regional and MAC Level of consciousness: awake and alert and oriented Pain management: pain level controlled Vital Signs Assessment: post-procedure vital signs reviewed and stable Respiratory status: spontaneous breathing, nonlabored ventilation and respiratory function stable Cardiovascular status: stable and blood pressure returned to baseline Postop Assessment: no apparent nausea or vomiting Anesthetic complications: no   No notable events documented.  Last Vitals:  Vitals:   01/28/22 1015 01/28/22 1030  BP: (!) 148/77 (!) 157/78  Pulse: 68 62  Resp: 16 14  Temp:  37 C  SpO2: 93% 93%    Last Pain:  Vitals:   01/28/22 1030  TempSrc:   PainSc: 0-No pain                 [name]

## 2022-01-28 NOTE — Op Note (Signed)
    NAME: Tanner Rollins    MRN: [id number] DOB: September 25, 1950    DATE OF OPERATION: 01/28/2022  PREOP DIAGNOSIS:    End-stage renal disease  POSTOP DIAGNOSIS:    Same  PROCEDURE:    Left basilic vein transposition  SURGEON: Judeth Cornfield. Scot Dock, MD  ASSIST: Leontine Locket, PA  ANESTHESIA: Scalene block  EBL: Minimal  INDICATIONS:    Tanner Rollins is a 71 y.o. male who presents for new access.  FINDINGS:   4-1/2 mm upper arm basilic vein.  Palpable radial pulse at the completion of the procedure.  Excellent thrill.  TECHNIQUE:   The patient was taken to the operating room and a block had been placed by anesthesia.  The left arm was prepped and draped in usual sterile fashion.  I made a curvilinear incision just above the antecubital level and here this basilic vein was dissected free and separated from the adjacent medial antebrachial cutaneous nerve.  The vein was good size.  I therefore elected to proceed with harvesting of the vein for a 1 stage basilic vein transposition.  Using 2 additional incisions along the medial aspect of the left arm the basilic vein was harvested to where it joined the brachial vein in the mid upper arm.  From here I took the brachial vein up to the axilla.  Branches were divided tween clips and ties.  Through the distal incision the brachial artery was dissected free.  Of note by duplex the patient had 2 large arteries here so apparently has a high bifurcation of the brachial artery although the radial and ulnar arteries were excellent size.  The vein was ligated distally and irrigated up with heparinized saline.  A tunnel was created between the 2 incisions and I passed an umbilical tape between the 2 incisions.  The patient was then heparinized.  The vein was then distended and marked to prevent twisting.  I then advanced a uterine dressing forcep using the umbilical tape through the tunnel and was able to bring the vein through the tunnel without  twisting.  The brachial artery was clamped proximally and distally and a longitudinal arteriotomy was made.  The vein was cut to the appropriate length spatulated and sewn end-to-side to the vein using continuous 6-0 Prolene suture.  At the completion there was an excellent thrill in the fistula and a palpable radial pulse.  Hemostasis was obtained in the wounds.  Each of the wounds was closed with a deep layer of 3-0 Vicryl to skin closed with 4-0 Monocryl.  Dermabond was applied.  The patient tolerated the procedure well was transferred to recovery room in stable condition.  All needle and sponge counts were correct.  Given the complexity of the case,  the assistant was necessary in order to expedient the procedure and safely perform the technical aspects of the operation.  The assistant provided traction and countertraction to assist with exposure of the artery and vein.  They also assisted with suture ligation of multiple venous branches.  They played a critical role in the anastomosis. These skills, especially following the Prolene suture for the anastomosis, could not have been adequately performed by a scrub tech assistant.    Deitra Mayo, MD, FACS [facility]  DATE OF DICTATION:   01/28/2022

## 2022-01-28 NOTE — TOC Progression Note (Signed)
Transition of Care [facility]) - Progression Note    Patient Details  Name: Tanner Rollins MRN: [id number] Date of Birth: [birth year]  Transition of Care [facility]) CM/SW Contact  Tom-Johnson, Renea Ee, RN Phone Number: 01/28/2022, 12:54 PM  Clinical Narrative:     Patient had AV Fistula placement today by Vascular. Currently on 3L O2. Pending PT/OT recommendations at this time. TOC will continue to follow with needs.     Barriers to Discharge: Continued Medical Work up  Expected Discharge Plan and Services     Discharge Planning Services: CM Consult   Living arrangements for the past 2 months: Apartment                                       Social Determinants of Health (SDOH) Interventions    Readmission Risk Interventions     No data to display

## 2022-01-28 NOTE — Progress Notes (Signed)
Requested to see pt for out-pt HD needs. Met with pt at bedside this afternoon. Introduced self and explained role. Confirmed with pt that he resides here in [place] and not [place]. Pt prefers [place]. Referral made to [facility] admissions this afternoon. Pt plans to drive self to out-pt HD appts. Will assist as needed.   Melven Sartorius Renal Navigator [phone]

## 2022-01-28 NOTE — Interval H&P Note (Signed)
History and Physical Interval Note:  01/28/2022 7:19 AM  Tanner Rollins  has presented today for surgery, with the diagnosis of End stage renal disease.  The various methods of treatment have been discussed with the patient and family. After consideration of risks, benefits and other options for treatment, the patient has consented to  Procedure(s): ARTERIOVENOUS (AV) FISTULA CREATION VS.GRAFT ARM (Left) as a surgical intervention.  The patient's history has been reviewed, patient examined, no change in status, stable for surgery.  I have reviewed the patient's chart and labs.  Questions were answered to the patient's satisfaction.     Deitra Mayo

## 2022-01-28 NOTE — Discharge Instructions (Addendum)
[facility]  Discharge Instructions  AV Fistula or Graft Surgery for Dialysis Access  Please refer to the following instructions for your post-procedure care. Your surgeon or physician assistant will discuss any changes with you.  Activity  You may drive the day following your surgery, if you are comfortable and no longer taking prescription pain medication. Resume full activity as the soreness in your incision resolves.  Bathing/Showering  You may shower after you go home. Keep your incision dry for 48 hours. Do not soak in a bathtub, hot tub, or swim until the incision heals completely. You may not shower if you have a hemodialysis catheter.  Incision Care  Clean your incision with mild soap and water after 48 hours. Pat the area dry with a clean towel. You do not need a bandage unless otherwise instructed. Do not apply any ointments or creams to your incision. You may have skin glue on your incision. Do not peel it off. It will come off on its own in about one week. Your arm may swell a bit after surgery. To reduce swelling use pillows to elevate your arm so it is above your heart. Your doctor will tell you if you need to lightly wrap your arm with an ACE bandage.  Diet  Resume your normal diet. There are not special food restrictions following this procedure. In order to heal from your surgery, it is CRITICAL to get adequate nutrition. Your body requires vitamins, minerals, and protein. Vegetables are the best source of vitamins and minerals. Vegetables also provide the perfect balance of protein. Processed food has little nutritional value, so try to avoid this.  Medications  Resume taking all of your medications. If your incision is causing pain, you may take over-the counter pain relievers such as acetaminophen (Tylenol). If you were prescribed a stronger pain medication, please be aware these medications can cause nausea and constipation. Prevent  nausea by taking the medication with a snack or meal. Avoid constipation by drinking plenty of fluids and eating foods with high amount of fiber, such as fruits, vegetables, and grains.  Do not take Tylenol if you are taking prescription pain medications.  Follow up Your surgeon may want to see you in the office following your access surgery. If so, this will be arranged at the time of your surgery.  Please call us immediately for any of the following conditions:  Increased pain, redness, drainage (pus) from your incision site Fever of 101 degrees or higher Severe or worsening pain at your incision site Hand pain or numbness.  Reduce your risk of vascular disease:  Stop smoking. If you would like help, call [facility] at 1-800-QUIT-NOW 787-293-7089) or [place] at [place] your cholesterol Maintain a desired weight Control your diabetes Keep your blood pressure down  Dialysis  It will take several weeks to several months for your new dialysis access to be ready for use. Your surgeon will determine when it is okay to use it. Your nephrologist will continue to direct your dialysis. You can continue to use your Permcath until your new access is ready for use.   01/28/2022 Tanner Rollins [id number] December 02, 1950  Surgeon(s): Angelia Mould, MD  Procedure(s): Single stage left basilic vein transposition  x Do not stick fistula for 12 weeks    If you have any questions, please call the office at [phone].  _______________________________________________________________________________________________________  Information on my medicine - ELIQUIS (apixaban)  This medication education was reviewed with  me or my healthcare representative as part of my discharge preparation.   Why was Eliquis prescribed for you? Eliquis was prescribed for you to reduce the risk of a blood clot forming that can cause a stroke if you have a medical condition called  atrial fibrillation (a type of irregular heartbeat).  What do You need to know about Eliquis ? Take your Eliquis TWICE DAILY - one tablet in the morning and one tablet in the evening with or without food. If you have difficulty swallowing the tablet whole please discuss with your pharmacist how to take the medication safely.  Take Eliquis exactly as prescribed by your doctor and DO NOT stop taking Eliquis without talking to the doctor who prescribed the medication.  Stopping may increase your risk of developing a stroke.  Refill your prescription before you run out.  After discharge, you should have regular check-up appointments with your healthcare provider that is prescribing your Eliquis.  In the future your dose may need to be changed if your kidney function or weight changes by a significant amount or as you get older.  What do you do if you miss a dose? If you miss a dose, take it as soon as you remember on the same day and resume taking twice daily.  Do not take more than one dose of ELIQUIS at the same time to make up a missed dose.  Important Safety Information A possible side effect of Eliquis is bleeding. You should call your healthcare provider right away if you experience any of the following: Bleeding from an injury or your nose that does not stop. Unusual colored urine (red or dark brown) or unusual colored stools (red or black). Unusual bruising for unknown reasons. A serious fall or if you hit your head (even if there is no bleeding).  Some medicines may interact with Eliquis and might increase your risk of bleeding or clotting while on Eliquis. To help avoid this, consult your healthcare provider or pharmacist prior to using any new prescription or non-prescription medications, including herbals, vitamins, non-steroidal anti-inflammatory drugs (NSAIDs) and supplements.  This website has more information on Eliquis (apixaban): http://www.eliquis.com/eliquis/home

## 2022-01-28 NOTE — Progress Notes (Signed)
   01/28/22 0433  Vitals  Temp 98.2 F (36.8 C)  Temp Source Oral  BP 139/71  MAP (mmHg) 92  BP Location Right Arm  BP Method Automatic  Patient Position (if appropriate) Lying  Pulse Rate 64  Pulse Rate Source Monitor  Resp 20  MEWS COLOR  MEWS Score Color Green  Oxygen Therapy  SpO2 99 %  Height and Weight  Weight 78.6 kg  BMI (Calculated) 23.83  MEWS Score  MEWS Temp 0  MEWS Systolic 0  MEWS Pulse 0  MEWS RR 0  MEWS LOC 0  MEWS Score 0   Patient c/o SOB. Dr. Jennette Kettle notified. Labs and chest x ray ordered.

## 2022-01-28 NOTE — Progress Notes (Signed)
PROGRESS NOTE    Tanner Rollins  VEL:[id number] DOB: 04-04-1951 DOA: 01/23/2022 PCP: Pcp, No    Brief Narrative:  71 year old with past medical history of hypertension and CKD stage IV presented with chest pain, blood pressure of 228/130 in the emergency room.  He was started on nitroglycerin and subsequently on Cardene drip and admitted to the ICU.  Creatinine was 26 on presentation.  Started on hemodialysis.  After stabilization transferred out of ICU.   Assessment & Plan:   Hypertensive emergency Acute kidney injury on CKD stage IV in the setting of hypertensive emergency now with ESRD.  Severe metabolic acidosis. Hypervolemic hyponatremia Acute hypoxemic respiratory failure secondary to bilateral pleural effusion and suspected aspiration pneumonia.  Initially treated with Cardene infusion and currently taken off. Blood pressures well controlled now on amlodipine 10 mg, hydralazine, Coreg 25 mg twice daily. Patient was started on hemodialysis, tunneled catheter 6/8 and subsequent hemodialysis. Vascular surgery following, underwent left AV fistula creation today. Presumed pneumonia, however improving.  We will continue Rocephin for 5 days. Will need outpatient dialysis schedule.  Nephrology and coordinator following.  Deconditioning and frailty: Work with PT OT.  Subsequent may need home health PT OT.   DVT prophylaxis: heparin injection 5,000 Units Start: 01/24/22 0930 SCDs Start: 01/23/22 1729   Code Status: Full code Family Communication: None Disposition Plan: Status is: Inpatient Remains inpatient appropriate because: Active treatment with hemodialysis, inpatient procedures planned     Consultants:  Vascular surgery Nephrology Critical care  Procedures:  Right subclavian permacath, hemodialysis  Antimicrobials:  Rocephin 6/7---   Subjective:  Patient seen and examined.  Came back from procedure.  Denies any complaints but he is  sleepy.  Objective: Vitals:   01/28/22 1000 01/28/22 1015 01/28/22 1030 01/28/22 1052  BP: (!) 142/69 (!) 148/77 (!) 157/78 (!) 147/83  Pulse: 68 68 62 62  Resp: 15 16 14 16   Temp: 98.8 F (37.1 C)  98.6 F (37 C)   TempSrc:      SpO2: 95% 93% 93% 96%  Weight:      Height:        Intake/Output Summary (Last 24 hours) at 01/28/2022 1316 Last data filed at 01/28/2022 0916 Gross per 24 hour  Intake 960 ml  Output 3 ml  Net 957 ml   Filed Weights   01/26/22 0750 01/27/22 0500 01/28/22 0433  Weight: 73.6 kg 78.4 kg 78.6 kg    Examination:  General exam: Appears calm and comfortable.  Postprocedure.  Sleepy. Respiratory system: No added sounds. Right IJ permacath in place. Cardiovascular system: S1 & S2 heard, RRR. No JVD, murmurs, rubs, gallops or clicks. No pedal edema. Gastrointestinal system: Abdomen is nondistended, soft and nontender. No organomegaly or masses felt. Normal bowel sounds heard. Central nervous system: Alert and oriented. No focal neurological deficits. Extremities: Symmetric 5 x 5 power. Left forearm/elbow on postop compression dressing.  Distal neurovascular status intact.    Data Reviewed: I have personally reviewed following labs and imaging studies  CBC: Recent Labs  Lab 01/23/22 1515 01/23/22 1527 01/23/22 2147 01/24/22 0017 01/24/22 0611 01/25/22 0122 01/26/22 0258 01/28/22 0436  WBC 10.0  --  12.6* 12.5*  --  13.8* 11.1* 8.4  NEUTROABS 8.0*  --   --   --   --   --   --   --   HGB 8.0*   < > 6.4* 6.9* 7.1* 7.8* 7.7* 7.4*  HCT 24.2*   < > 19.4* 20.3* 21.0* 22.0* 22.6* 22.5*  MCV 85.2  --  82.9 81.9  --  82.1 82.2 85.9  PLT 132*  --  122* 119*  --  136* 137* 217   < > = values in this interval not displayed.   Basic Metabolic Panel: Recent Labs  Lab 01/23/22 1850 01/23/22 2147 01/24/22 0017 01/24/22 0076 01/25/22 0122 01/26/22 0258 01/28/22 0436  NA 137 140 138 135 135 132* 134*  K 5.7* 6.4* 4.5 5.1 4.0 4.9 4.1  CL 108 106  106  --  98 98 96*  CO2 8* 15* 12*  --  19* 20* 23  GLUCOSE 128* 160* 172*  --  97 88 95  BUN 150* 149* 145*  --  83* 108* 86*  CREATININE 27.02* 24.77* 24.40*  --  14.28* 17.93* 15.36*  CALCIUM 8.9 8.3* 8.8*  --  8.3* 8.2* 8.4*  MG  --   --  2.2  --  1.7 2.0  --   PHOS 9.0*  --  9.1*  --  6.3* 8.9*  --    GFR: Estimated Creatinine Clearance: 4.9 mL/min (A) (by C-G formula based on SCr of 15.36 mg/dL (H)). Liver Function Tests: Recent Labs  Lab 01/23/22 1515 01/23/22 1850  AST 24  --   ALT 14  --   ALKPHOS 81  --   BILITOT 0.9  --   PROT 6.2*  --   ALBUMIN 3.0* 2.9*   Recent Labs  Lab 01/23/22 1515  LIPASE 210*   No results for input(s): "AMMONIA" in the last 168 hours. Coagulation Profile: No results for input(s): "INR", "PROTIME" in the last 168 hours. Cardiac Enzymes: No results for input(s): "CKTOTAL", "CKMB", "CKMBINDEX", "TROPONINI" in the last 168 hours. BNP (last 3 results) No results for input(s): "PROBNP" in the last 8760 hours. HbA1C: No results for input(s): "HGBA1C" in the last 72 hours. CBG: Recent Labs  Lab 01/23/22 1912 01/24/22 0421  GLUCAP 115* 161*   Lipid Profile: No results for input(s): "CHOL", "HDL", "LDLCALC", "TRIG", "CHOLHDL", "LDLDIRECT" in the last 72 hours. Thyroid Function Tests: No results for input(s): "TSH", "T4TOTAL", "FREET4", "T3FREE", "THYROIDAB" in the last 72 hours. Anemia Panel: No results for input(s): "VITAMINB12", "FOLATE", "FERRITIN", "TIBC", "IRON", "RETICCTPCT" in the last 72 hours. Sepsis Labs: Recent Labs  Lab 01/23/22 1649 01/23/22 1850  PROCALCITON  --  0.96  LATICACIDVEN 1.8 2.0*    Recent Results (from the past 240 hour(s))  MRSA Next Gen by PCR, Nasal     Status: None   Collection Time: 01/25/22 11:01 AM   Specimen: Nasal Mucosa; Nasal Swab  Result Value Ref Range Status   MRSA by PCR Next Gen NOT DETECTED NOT DETECTED Final    Comment: (NOTE) The GeneXpert MRSA Assay (FDA approved for NASAL  specimens only), is one component of a comprehensive MRSA colonization surveillance program. It is not intended to diagnose MRSA infection nor to guide or monitor treatment for MRSA infections. Test performance is not FDA approved in patients less than 31 years old. Performed at [facility], [place] [address], [place], [place] [address]   Surgical PCR screen     Status: None   Collection Time: 01/27/22  8:56 PM   Specimen: Nasal Mucosa; Nasal Swab  Result Value Ref Range Status   MRSA, PCR NEGATIVE NEGATIVE Final   Staphylococcus aureus NEGATIVE NEGATIVE Final    Comment: (NOTE) The Xpert SA Assay (FDA approved for NASAL specimens in patients 28 years of age and older), is one component of  a comprehensive surveillance program. It is not intended to diagnose infection nor to guide or monitor treatment. Performed at [facility], [place] [address] [place], [place] [address]          Radiology Studies: DG CHEST PORT 1 VIEW  Result Date: 01/28/2022 CLINICAL DATA:  Shortness of breath. EXAM: PORTABLE CHEST 1 VIEW COMPARISON:  01/25/2022. FINDINGS: 4:39 a.m., 01/29/2019. Right IJ dialysis catheter tip remains in the upper right atrium. The heart is enlarged. Mild central vascular prominence with slight basilar interstitial edema continue to be noted with moderate pleural effusions. There are opacities in the lung bases which again could represent atelectasis, or patchy consolidation such as due to pneumonia, aspiration or combination. The mid and upper lung fields are clear with COPD change. There is aortic atherosclerosis and tortuosity. Either the pleural effusions are mildly improved or redistributed. In all other respects there are no further changes. IMPRESSION: There is mild improvement in aeration of the bases but there are still patchy consolidative or atelectatic opacities overlying moderate pleural effusions. The pleural effusions may have mildly improved versus  redistributed. Perihilar vascular congestion and slight basal interstitial edema appear similar. Electronically Signed   By: Telford Nab M.D.   On: 01/28/2022 06:36        Scheduled Meds:  sodium chloride   Intravenous Once   amLODipine  10 mg Oral Daily   aspirin EC  81 mg Oral Daily   calcium acetate  1,334 mg Oral TID WC   carvedilol  25 mg Oral BID WC   Chlorhexidine Gluconate Cloth  6 each Topical Q0600   docusate sodium  100 mg Oral BID   heparin injection (subcutaneous)  5,000 Units Subcutaneous Q8H   hydrALAZINE  50 mg Oral Q6H   mouth rinse  15 mL Mouth Rinse BID   polyethylene glycol  17 g Oral Daily   sodium bicarbonate  1,300 mg Oral TID   sodium chloride flush  10-40 mL Intracatheter Q12H   Continuous Infusions:  anticoagulant sodium citrate       LOS: 5 days    Time spent: 35 minutes    Barb Merino, MD [facility] Pager [phone]

## 2022-01-29 ENCOUNTER — Encounter (HOSPITAL_COMMUNITY): Payer: Self-pay | Admitting: Vascular Surgery

## 2022-01-29 MED ORDER — DARBEPOETIN ALFA 150 MCG/0.3ML IJ SOSY
150.0000 ug | PREFILLED_SYRINGE | INTRAMUSCULAR | Status: DC
  Administered 2022-01-30: 150 ug via INTRAVENOUS
  Filled 2022-01-29 (×2): qty 0.3

## 2022-01-29 MED ORDER — CALCITRIOL 0.25 MCG PO CAPS
0.2500 ug | ORAL_CAPSULE | ORAL | Status: DC
  Administered 2022-01-30: 0.25 ug via ORAL
  Filled 2022-01-29 (×3): qty 1

## 2022-01-29 NOTE — Progress Notes (Signed)
Clinicals fax multiple times to [facility] admissions today per their request. [facility] to review records for clinic placement.   Melven Sartorius Renal Navigator [phone]

## 2022-01-29 NOTE — Progress Notes (Signed)
Patient ID: Tanner Rollins, male   DOB: 18-Mar-1951, 71 y.o.   MRN: [id number]  Assessment/Plan:   ESRD - already advanced CKD stage IV secondary to poorly controlled HTN for the past year and now progressed to ESRD.  Strong family history of ESRD.  Renal US consistent with chronic medical renal disease.  No obstruction or hydro.  Tunneled HD catheter placed 01/24/22 and first HD session afterwards and had second today.  Appreciate single stage lt BBT by Dr. Scot Dock 6/12; tolerated HD on 6/12 w/ no cramping and nausea better afterwards.  Renal navigator aware as well. Hypertensive emergency - markedly improved initially on  Cardene gtt.  Appreciate PCCM's assistance.  Now off of cardene gtt and on amlodipine 10 mg, carvedilol 25 mg bid, hydralazine 50 mg q6. Hyperkalemia - improved with lasix and lokelma. Acute hypoxic respiratory failure - wean supplemental oxygen.  CXR with bilateral lower lobe airspace disease worrisome for infection.  Small left pleural effusion, moderate right pleural effusion.  Antibiotics per PCCM.  Will UF with next HD session but may need thoracentesis.  ECHO with normal EF, Grade I DD, elevated LVEDP and pulmonary HTN. Anemia of CKD stage V - iron 37, TSAT 19%, ferritin 654.  S/p blood transfusion.   Started IV iron and ESA and follow; aranesp 150 qwed. CKD-MBD - iPTH pending but with low calcium, started on calcitriol 0.25 MWF. Phoslo 2 tabs TIDM. Eating now. Vascular access- R[facility] 01/24/22 and lt BBT (single stage) placement 01/28/22 by Dr. Scot Dock.  Disposition - he will need outpatient HD arrangements prior to discharge as he is likely ESRD.  S: Feels weak and tired, nausea better after hd yest.   O:BP 130/81 (BP Location: Right Arm)   Pulse 76   Temp 98 F (36.7 C) (Oral)   Resp 18   Ht 5' 11.5" (1.816 m)   Wt 75.4 kg   SpO2 95%   BMI 22.86 kg/m   Intake/Output Summary (Last 24 hours) at 01/29/2022 0718 Last data filed at 01/29/2022 0500 Gross per 24 hour   Intake 560 ml  Output 200 ml  Net 360 ml   Intake/Output: I/O last 3 completed shifts: In: 53 [P.O.:180; I.V.:500; IV Piggyback:100] Out: 203 [Urine:200; Blood:3]  Intake/Output this shift:  No intake/output data recorded. Weight change: -3.2 kg Gen:NAD CVS: RRR Resp: CTA Abd: +BS, soft, NT/ND Ext: no edema ACCESS: lt BBT w/ good bruit, RIJ TC  Recent Labs  Lab 01/23/22 1515 01/23/22 1527 01/23/22 1850 01/23/22 2147 01/24/22 0017 01/24/22 0611 01/25/22 0122 01/26/22 0258 01/28/22 0436  NA 137   < > 137 140 138 135 135 132* 134*  K 5.0   < > 5.7* 6.4* 4.5 5.1 4.0 4.9 4.1  CL 108  --  108 106 106  --  98 98 96*  CO2 9*  --  8* 15* 12*  --  19* 20* 23  GLUCOSE 135*  --  128* 160* 172*  --  97 88 95  BUN 140*  --  150* 149* 145*  --  83* 108* 86*  CREATININE 26.11*  --  27.02* 24.77* 24.40*  --  14.28* 17.93* 15.36*  ALBUMIN 3.0*  --  2.9*  --   --   --   --   --   --   CALCIUM 8.9  --  8.9 8.3* 8.8*  --  8.3* 8.2* 8.4*  PHOS  --   --  9.0*  --  9.1*  --  6.3*  8.9*  --   AST 24  --   --   --   --   --   --   --   --   ALT 14  --   --   --   --   --   --   --   --    < > = values in this interval not displayed.   Liver Function Tests: Recent Labs  Lab 01/23/22 1515 01/23/22 1850  AST 24  --   ALT 14  --   ALKPHOS 81  --   BILITOT 0.9  --   PROT 6.2*  --   ALBUMIN 3.0* 2.9*   Recent Labs  Lab 01/23/22 1515  LIPASE 210*   No results for input(s): "AMMONIA" in the last 168 hours. CBC: Recent Labs  Lab 01/23/22 1515 01/23/22 1527 01/23/22 2147 01/24/22 0017 01/24/22 0611 01/25/22 0122 01/26/22 0258 01/28/22 0436  WBC 10.0  --  12.6* 12.5*  --  13.8* 11.1* 8.4  NEUTROABS 8.0*  --   --   --   --   --   --   --   HGB 8.0*   < > 6.4* 6.9*   < > 7.8* 7.7* 7.4*  HCT 24.2*   < > 19.4* 20.3*   < > 22.0* 22.6* 22.5*  MCV 85.2  --  82.9 81.9  --  82.1 82.2 85.9  PLT 132*  --  122* 119*  --  136* 137* 217   < > = values in this interval not displayed.    Cardiac Enzymes: No results for input(s): "CKTOTAL", "CKMB", "CKMBINDEX", "TROPONINI" in the last 168 hours. CBG: Recent Labs  Lab 01/23/22 1912 01/24/22 0421  GLUCAP 115* 161*    Iron Studies: No results for input(s): "IRON", "TIBC", "TRANSFERRIN", "FERRITIN" in the last 72 hours. Studies/Results: DG CHEST PORT 1 VIEW  Result Date: 01/28/2022 CLINICAL DATA:  Shortness of breath. EXAM: PORTABLE CHEST 1 VIEW COMPARISON:  01/25/2022. FINDINGS: 4:39 a.m., 01/29/2019. Right IJ dialysis catheter tip remains in the upper right atrium. The heart is enlarged. Mild central vascular prominence with slight basilar interstitial edema continue to be noted with moderate pleural effusions. There are opacities in the lung bases which again could represent atelectasis, or patchy consolidation such as due to pneumonia, aspiration or combination. The mid and upper lung fields are clear with COPD change. There is aortic atherosclerosis and tortuosity. Either the pleural effusions are mildly improved or redistributed. In all other respects there are no further changes. IMPRESSION: There is mild improvement in aeration of the bases but there are still patchy consolidative or atelectatic opacities overlying moderate pleural effusions. The pleural effusions may have mildly improved versus redistributed. Perihilar vascular congestion and slight basal interstitial edema appear similar. Electronically Signed   By: Telford Nab M.D.   On: 01/28/2022 06:36     sodium chloride   Intravenous Once   amLODipine  10 mg Oral Daily   aspirin EC  81 mg Oral Daily   calcium acetate  1,334 mg Oral TID WC   carvedilol  25 mg Oral BID WC   Chlorhexidine Gluconate Cloth  6 each Topical Q0600   docusate sodium  100 mg Oral BID   heparin injection (subcutaneous)  5,000 Units Subcutaneous Q8H   hydrALAZINE  50 mg Oral Q6H   mouth rinse  15 mL Mouth Rinse BID   polyethylene glycol  17 g Oral Daily   sodium bicarbonate  1,300  mg  Oral TID   sodium chloride flush  10-40 mL Intracatheter Q12H    BMET    Component Value Date/Time   NA 134 (L) 01/28/2022 0436   K 4.1 01/28/2022 0436   CL 96 (L) 01/28/2022 0436   CO2 23 01/28/2022 0436   GLUCOSE 95 01/28/2022 0436   BUN 86 (H) 01/28/2022 0436   CREATININE 15.36 (H) 01/28/2022 0436   CALCIUM 8.4 (L) 01/28/2022 0436   GFRNONAA 3 (L) 01/28/2022 0436   CBC    Component Value Date/Time   WBC 8.4 01/28/2022 0436   RBC 2.62 (L) 01/28/2022 0436   HGB 7.4 (L) 01/28/2022 0436   HCT 22.5 (L) 01/28/2022 0436   PLT 217 01/28/2022 0436   MCV 85.9 01/28/2022 0436   MCH 28.2 01/28/2022 0436   MCHC 32.9 01/28/2022 0436   RDW 16.0 (H) 01/28/2022 0436   LYMPHSABS 1.1 01/23/2022 1515   MONOABS 0.8 01/23/2022 1515   EOSABS 0.0 01/23/2022 1515   BASOSABS 0.0 01/23/2022 1515

## 2022-01-29 NOTE — TOC Initial Note (Signed)
Transition of Care [facility]) - Initial/Assessment Note    Patient Details  Name: Tanner Rollins MRN: [id number] Date of Birth: 1951-07-13  [facility]) CM/SW Contact:    Milinda Antis, [place] Phone Number: 01/29/2022, 2:53 PM  Clinical Narrative:                 CSW received consult for possible SNF placement at time of discharge. CSW spoke with patient at bedside.  Patient expressed understanding of MD's recommendation and is agreeable to SNF placement at time of discharge. Patient reports preference for a facility in [place]. CSW discussed insurance authorization process and will provide Medicare SNF ratings list. Patient has received 0 COVID vaccines. CSW will send out referrals for review after patient works with PT.  No further questions reported at this time.   Skilled Nursing Rehab Facilities-   [email or web address]   Ratings out of 5 possible   Name Address  Phone # [place] Inspection Overall  [facility] [address], [place] 4 5 2 3   [facility]  [place], [place] [phone] 3 2 5 5   [facility] [place], [place] 3 1 1 1   [place], [place] 3 2 4 4   [facility] [address], [place] 1 1 2 1   [place]. [address] [place] [phone] 2 1 4 3   [facility] [address], [place] 5 2 3 4   [facility] [address], [place] 5 2 2 3   [address] ([place]) [place], [place] [phone] 5 1 2 2   [facility] [address], [place] [phone] 4 1 2 1   [facility] [address] [facility] [phone] 4 1 2 1   [facility] [place]. [place], [place] [phone] 4 1 1 1   [facility] [address] [place], [place] [phone] 3 2 4 4            [place] [address], [place] 4 2 3 3   [facility] [place] [address], [place] 4 1 5 4   [place], [place] [phone] 2 1 1 1   [facility] [address] [employer] [phone] 2 1 3 2           [address] (no [facility]) [place] [address], [place] [phone] 4 5 5 5   [facility] (No Hum[employer]) [address], [place] 3 1 4 3   [place] (No [employer]HC) [place], [place] [phone] 5 5 5 5   [facility] [address], [employer] [phone] 3 2 4 4   [place] [address] [place] 1 1 2 1   [place] [address], [place] [phone] 2 1 1 1   [place], [place] 5 2 4 5   [facility] [address], [place] 3 1 1 1   [facility] [place], [place] 2 1 2 1           [facility] [address] [place] [phone] 1 1 1 1   [place] [address], [place]  [phone] 2 4 2 2   [facility] [place] [address], [place] [phone] 5 2 3 4   [place], [place]  [phone] 2 1 1 1   [place] (No Humana) [address], [place] [phone] 2 1 3 2   [facility] [address] [place] [phone] 3 1 1 1           [facility] [place], [place] 5 4 5 5   [facility])  [address], [place] 2 2 3 3   [facility]) [place], [place] 3 2 4 4   [place] [address] [place] 4 3 4 4   [address] [place], [place] 3 3 1 1   [place] [facility]) [address] [place] [phone] 2 2 4 4      Expected Discharge Plan: [place] Barriers to Discharge: Insurance Authorization, Continued Medical Work up, [place]alie G[place]), SNF Pending bed offer   Patient Goals and CMS Choice Patient states their goals for this hospitalization and ongoing recovery are:: To return home CMS Medicare.gov Compare Post Acute Care list provided to:: Patient    Expected Discharge Plan and Services Expected Discharge Plan: [place]   Discharge Planning Services: CM Consult   Living arrangements for the past 2 months: Apartment                                      Prior Living Arrangements/Services Living arrangements for the past 2 months: Apartment Lives with:: Self Patient language and need for interpreter reviewed:: Yes Do you feel safe going back to the place where you live?: Yes      Need for Family Participation in Patient Care: Yes (Comment) Care giver support system in place?: Yes (comment)   Criminal Activity/Legal Involvement Pertinent to Current Situation/Hospitalization: No - Comment as needed  Activities of Daily Living Home Assistive Devices/Equipment: None ADL Screening (condition at time of admission) Patient's cognitive ability adequate to safely complete daily activities?: Yes Is the patient deaf or have difficulty hearing?: No Does the patient have difficulty seeing, even when wearing glasses/contacts?: No Does the patient have difficulty concentrating, remembering, or making decisions?: No Patient able to express need for assistance with ADLs?: No Does the patient have difficulty dressing or bathing?: No Independently performs ADLs?: Yes (appropriate for developmental age) Does the patient have difficulty walking or climbing stairs?: No Weakness of Legs: None Weakness of Arms/Hands: None  Permission Sought/Granted Permission sought to share information with : Case Manager, Family Supports, Chartered certified accountant granted to share  information with : Yes, Verbal Permission Granted              Emotional Assessment Appearance:: Appears stated age Attitude/Demeanor/Rapport: Gracious, Engaged Affect (typically observed): Accepting, Appropriate, Calm, Hopeful Orientation: : Oriented to Self, Oriented to Place, Oriented to  Time, Oriented to Situation Alcohol / Substance Use: Not Applicable Psych Involvement: No (comment)  Admission diagnosis:  Hypertensive emergency [I16.1] Chest pain, unspecified type [R07.9] Patient Active Problem List   Diagnosis Date Noted   High anion gap metabolic acidosis    Hypertensive emergency 01/23/2022   Chest pain    AKI (acute kidney injury) ([place])    CKD (chronic kidney disease) stage 4, GFR 15-29 ml/min ([place]) 03/12/2021   Enlarged prostate without urinary obstruction 03/12/2021   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   Severe hypertension 03/11/2021   PCP:  Pcp, No Pharmacy[facility]  [place], [place] [address] Phone: [phone] Fax: [phone]     Social Determinants of Health (SDOH) Interventions    Readmission Risk Interventions     No data to display

## 2022-01-29 NOTE — Progress Notes (Addendum)
  Progress Note    01/29/2022 7:27 AM 1 Day Post-Op  Subjective:  no complaints. Says arm is just a little sore. Sitting up eating breakfast   Vitals:   01/28/22 2130 01/29/22 0526  BP: 134/79 130/81  Pulse: 79 76  Resp: 18 18  Temp: 98.7 F (37.1 C) 98 F (36.7 C)  SpO2: 98% 95%   Physical Exam: Cardiac:  regular Lungs:  non labored Incisions:  left upper arm dressings c/d/i Extremities:  left AV fistula with good thrill. Very minimal swelling in left hand. 2+ radial pulse. 5/5 grip strength Neurologic: alert and oriented  CBC    Component Value Date/Time   WBC 8.4 01/28/2022 0436   RBC 2.62 (L) 01/28/2022 0436   HGB 7.4 (L) 01/28/2022 0436   HCT 22.5 (L) 01/28/2022 0436   PLT 217 01/28/2022 0436   MCV 85.9 01/28/2022 0436   MCH 28.2 01/28/2022 0436   MCHC 32.9 01/28/2022 0436   RDW 16.0 (H) 01/28/2022 0436   LYMPHSABS 1.1 01/23/2022 1515   MONOABS 0.8 01/23/2022 1515   EOSABS 0.0 01/23/2022 1515   BASOSABS 0.0 01/23/2022 1515    BMET    Component Value Date/Time   NA 134 (L) 01/28/2022 0436   K 4.1 01/28/2022 0436   CL 96 (L) 01/28/2022 0436   CO2 23 01/28/2022 0436   GLUCOSE 95 01/28/2022 0436   BUN 86 (H) 01/28/2022 0436   CREATININE 15.36 (H) 01/28/2022 0436   CALCIUM 8.4 (L) 01/28/2022 0436   GFRNONAA 3 (L) 01/28/2022 0436    INR No results found for: "INR"   Intake/Output Summary (Last 24 hours) at 01/29/2022 0727 Last data filed at 01/29/2022 0500 Gross per 24 hour  Intake 560 ml  Output 200 ml  Net 360 ml     Assessment/Plan:  71 y.o. male is s/p left BV transposition 1 Day Post-Op   Left BV AV fistula with good thrill Left arm well perfused with palpable radial pulse No signs or symptoms of steal Elevate LUE as needed. Okay to keep ACE for compression Will arrange outpatient follow up in 4-6 with fistula duplex  Karoline Caldwell, PA-C [facility] [phone] 01/29/2022 7:27 AM  I have interviewed the patient  and examined the patient. I agree with the findings by the PA.  I took his dressing off and all of his incisions look fine.  He has an excellent thrill in his fistula.  Vascular surgery will be available as needed.  Gae Gallop, MD

## 2022-01-29 NOTE — Care Management Important Message (Signed)
Important Message  Patient Details  Name: Tanner Rollins MRN: [id number] Date of Birth: [birth year]   Medicare Important Message Given:  Yes     [name] [address] Circle 01/29/2022, 3:18 PM

## 2022-01-29 NOTE — Progress Notes (Signed)
PT Cancellation Note  Patient Details Name: Tanner Rollins MRN: [id number] DOB: 1950/12/17   Cancelled Treatment:    Reason Eval/Treat Not Completed: Other (comment)  Politely declining PT at this time; Very tired;  Reports he had gotten up earlier and that he did not experience as much dizziness as on PT eval;   Will follow up later today as time allows;  Otherwise, will follow up for PT tomorrow;   Thank you,  Roney Marion, PT  Acute Rehabilitation Services Pager [phone] Office [phone]  [place] 01/29/2022, 12:57 PM

## 2022-01-29 NOTE — Progress Notes (Signed)
New Dialysis Start   Patient identified as new dialysis start. Kidney Education packet assembled and given. Discussed the following items with patient:    Current medications and possible changes once started:  Discussed that patient's medications may change over time.  Ex; hypertension medications and diabetes medication.  Nephrologists will adjust as needed.  Fluid restrictions reviewed:  32 oz daily goal:  All liquids count; soups, ice, jello   Phosphorus and potassium: Handout given showing high potassium and phosphorus foods.  Alternative food and drink options given.  Family support:  no family present at bedside  Outpatient Clinic Resources:  Discussed roles of Outpatient clinic  staff and advised to make a list of needs, if any, to talk with outpatient staff if needed  Care plan schedule: Informed patient and family member of Care Plans in outpatient setting and to participate in the care plan.  An invitation would be given from outpatient clinic.   Dialysis Access Options:  Reviewed access options with patients. Discussed in detail about care at home with new AVG & AVF. Reviewed checking bruit and thrill. If dialysis catheter present, educated that patient could not take showers.  Catheter dressing changes were to be done by outpatient clinic staff only  Home therapy options:  Educated patient about home therapy options:  Patient stated he is not interested at this time.   Patient verbalized understanding. Will continue to round on patient during admission.    Lilia Argue, RN

## 2022-01-29 NOTE — Progress Notes (Signed)
PROGRESS NOTE    Tanner Rollins  KXF:[id number] DOB: 09/24/1950 DOA: 01/23/2022 PCP: Pcp, No    Brief Narrative:  71 year old with past medical history of hypertension and CKD stage IV presented with chest pain, blood pressure of 228/130 in the emergency room.  He was started on nitroglycerin and subsequently on Cardene drip and admitted to the ICU.  Creatinine was 26 on presentation.  Started on hemodialysis.  After stabilization transferred out of ICU. Remains in the hospital, new dialysis.  Assessment & Plan:   Hypertensive emergency Acute kidney injury on CKD stage IV in the setting of hypertensive emergency now with ESRD.  Severe metabolic acidosis. Hypervolemic hyponatremia Acute hypoxemic respiratory failure secondary to bilateral pleural effusion and suspected aspiration pneumonia.  Initially treated with Cardene infusion and currently taken off. Blood pressures well controlled now on amlodipine 10 mg, hydralazine, Coreg 25 mg twice daily. Patient was started on hemodialysis, tunneled catheter 6/8 and subsequent hemodialysis. Underwent AV fistula creation left arm on 6/12, surgically stable as per surgery. Presumed pneumonia, however improving.  Completed 5 days of antibiotics. Will need outpatient dialysis schedule.  Nephrology and coordinator following.  Deconditioning and frailty: Work with PT OT.  He is very deconditioned.  May benefit with short-term rehab at a skilled nursing facility before going home independent living.   DVT prophylaxis: heparin injection 5,000 Units Start: 01/24/22 0930 SCDs Start: 01/23/22 1729   Code Status: Full code Family Communication: None Disposition Plan: Status is: Inpatient Remains inpatient appropriate because: Active treatment with hemodialysis, outpatient dialysis schedule pending.  Will need SNF.     Consultants:  Vascular surgery Nephrology Critical care  Procedures:  Right subclavian permacath, hemodialysis AV fistula  creation 6/12.  Antimicrobials:  Rocephin 6/7--- 6/12.   Subjective:  Patient seen and examined.  Sore left arm.  Denies any nausea vomiting.  Feels weak and unsure about able to get around in the house.  Lives alone.  Objective: Vitals:   01/28/22 2045 01/28/22 2130 01/29/22 0526 01/29/22 0924  BP: (!) 148/86 134/79 130/81 (!) 145/76  Pulse: 80 79 76 73  Resp: 16 18 18 17   Temp: 97.6 F (36.4 C) 98.7 F (37.1 C) 98 F (36.7 C) 98.4 F (36.9 C)  TempSrc: Oral  Oral   SpO2: 96% 98% 95% 99%  Weight:      Height:        Intake/Output Summary (Last 24 hours) at 01/29/2022 1046 Last data filed at 01/29/2022 0800 Gross per 24 hour  Intake 260 ml  Output 200 ml  Net 60 ml   Filed Weights   01/27/22 0500 01/28/22 0433 01/28/22 1603  Weight: 78.4 kg 78.6 kg 75.4 kg    Examination:  General: Looks fairly comfortable.  On minimum oxygen. Cardiovascular: S1-S2 normal.  Regular rate rhythm.  Right IJ permacath present.  Clean and dry. Respiratory: Bilateral clear.  No added sounds. Gastrointestinal: Soft.  Nontender.  Bowel sound present. Ext: No swelling or edema.  No cyanosis. Neuro: Intact. Musculoskeletal: No deformities.  Left arm with AV fistula on Ace bandage.  Minimal swelling distal.  Vascular status intact. Skin: Intact.    Data Reviewed: I have personally reviewed following labs and imaging studies  CBC: Recent Labs  Lab 01/23/22 1515 01/23/22 1527 01/23/22 2147 01/24/22 0017 01/24/22 0611 01/25/22 0122 01/26/22 0258 01/28/22 0436  WBC 10.0  --  12.6* 12.5*  --  13.8* 11.1* 8.4  NEUTROABS 8.0*  --   --   --   --   --   --   --  HGB 8.0*   < > 6.4* 6.9* 7.1* 7.8* 7.7* 7.4*  HCT 24.2*   < > 19.4* 20.3* 21.0* 22.0* 22.6* 22.5*  MCV 85.2  --  82.9 81.9  --  82.1 82.2 85.9  PLT 132*  --  122* 119*  --  136* 137* 217   < > = values in this interval not displayed.   Basic Metabolic Panel: Recent Labs  Lab 01/23/22 1850 01/23/22 2147 01/24/22 0017  01/24/22 1610 01/25/22 0122 01/26/22 0258 01/28/22 0436  NA 137 140 138 135 135 132* 134*  K 5.7* 6.4* 4.5 5.1 4.0 4.9 4.1  CL 108 106 106  --  98 98 96*  CO2 8* 15* 12*  --  19* 20* 23  GLUCOSE 128* 160* 172*  --  97 88 95  BUN 150* 149* 145*  --  83* 108* 86*  CREATININE 27.02* 24.77* 24.40*  --  14.28* 17.93* 15.36*  CALCIUM 8.9 8.3* 8.8*  --  8.3* 8.2* 8.4*  MG  --   --  2.2  --  1.7 2.0  --   PHOS 9.0*  --  9.1*  --  6.3* 8.9*  --    GFR: Estimated Creatinine Clearance: 4.8 mL/min (A) (by C-G formula based on SCr of 15.36 mg/dL (H)). Liver Function Tests: Recent Labs  Lab 01/23/22 1515 01/23/22 1850  AST 24  --   ALT 14  --   ALKPHOS 81  --   BILITOT 0.9  --   PROT 6.2*  --   ALBUMIN 3.0* 2.9*   Recent Labs  Lab 01/23/22 1515  LIPASE 210*   No results for input(s): "AMMONIA" in the last 168 hours. Coagulation Profile: No results for input(s): "INR", "PROTIME" in the last 168 hours. Cardiac Enzymes: No results for input(s): "CKTOTAL", "CKMB", "CKMBINDEX", "TROPONINI" in the last 168 hours. BNP (last 3 results) No results for input(s): "PROBNP" in the last 8760 hours. HbA1C: No results for input(s): "HGBA1C" in the last 72 hours. CBG: Recent Labs  Lab 01/23/22 1912 01/24/22 0421  GLUCAP 115* 161*   Lipid Profile: No results for input(s): "CHOL", "HDL", "LDLCALC", "TRIG", "CHOLHDL", "LDLDIRECT" in the last 72 hours. Thyroid Function Tests: No results for input(s): "TSH", "T4TOTAL", "FREET4", "T3FREE", "THYROIDAB" in the last 72 hours. Anemia Panel: No results for input(s): "VITAMINB12", "FOLATE", "FERRITIN", "TIBC", "IRON", "RETICCTPCT" in the last 72 hours. Sepsis Labs: Recent Labs  Lab 01/23/22 1649 01/23/22 1850  PROCALCITON  --  0.96  LATICACIDVEN 1.8 2.0*    Recent Results (from the past 240 hour(s))  MRSA Next Gen by PCR, Nasal     Status: None   Collection Time: 01/25/22 11:01 AM   Specimen: Nasal Mucosa; Nasal Swab  Result Value Ref  Range Status   MRSA by PCR Next Gen NOT DETECTED NOT DETECTED Final    Comment: (NOTE) The GeneXpert MRSA Assay (FDA approved for NASAL specimens only), is one component of a comprehensive MRSA colonization surveillance program. It is not intended to diagnose MRSA infection nor to guide or monitor treatment for MRSA infections. Test performance is not FDA approved in patients less than 59 years old. Performed at [facility], [place] [address] [place], [place] [address]   Surgical PCR screen     Status: None   Collection Time: 01/27/22  8:56 PM   Specimen: Nasal Mucosa; Nasal Swab  Result Value Ref Range Status   MRSA, PCR NEGATIVE NEGATIVE Final   Staphylococcus aureus NEGATIVE NEGATIVE Final  Comment: (NOTE) The Xpert SA Assay (FDA approved for NASAL specimens in patients 70 years of age and older), is one component of a comprehensive surveillance program. It is not intended to diagnose infection nor to guide or monitor treatment. Performed at [facility], [place] [address] [place], [place] [address]          Radiology Studies: DG CHEST PORT 1 VIEW  Result Date: 01/28/2022 CLINICAL DATA:  Shortness of breath. EXAM: PORTABLE CHEST 1 VIEW COMPARISON:  01/25/2022. FINDINGS: 4:39 a.m., 01/29/2019. Right IJ dialysis catheter tip remains in the upper right atrium. The heart is enlarged. Mild central vascular prominence with slight basilar interstitial edema continue to be noted with moderate pleural effusions. There are opacities in the lung bases which again could represent atelectasis, or patchy consolidation such as due to pneumonia, aspiration or combination. The mid and upper lung fields are clear with COPD change. There is aortic atherosclerosis and tortuosity. Either the pleural effusions are mildly improved or redistributed. In all other respects there are no further changes. IMPRESSION: There is mild improvement in aeration of the bases but there are still patchy  consolidative or atelectatic opacities overlying moderate pleural effusions. The pleural effusions may have mildly improved versus redistributed. Perihilar vascular congestion and slight basal interstitial edema appear similar. Electronically Signed   By: Telford Nab M.D.   On: 01/28/2022 06:36        Scheduled Meds:  sodium chloride   Intravenous Once   amLODipine  10 mg Oral Daily   aspirin EC  81 mg Oral Daily   [START ON 01/30/2022] calcitRIOL  0.25 mcg Oral Q M,W,F   calcium acetate  1,334 mg Oral TID WC   carvedilol  25 mg Oral BID WC   Chlorhexidine Gluconate Cloth  6 each Topical Q0600   [START ON 01/30/2022] darbepoetin (ARANESP) injection - DIALYSIS  150 mcg Intravenous Q Wed-HD   docusate sodium  100 mg Oral BID   heparin injection (subcutaneous)  5,000 Units Subcutaneous Q8H   hydrALAZINE  50 mg Oral Q6H   mouth rinse  15 mL Mouth Rinse BID   polyethylene glycol  17 g Oral Daily   sodium chloride flush  10-40 mL Intracatheter Q12H   Continuous Infusions:  anticoagulant sodium citrate       LOS: 6 days    Time spent: 35 minutes    Barb Merino, MD [facility] Pager [phone]

## 2022-01-30 ENCOUNTER — Inpatient Hospital Stay (HOSPITAL_COMMUNITY): Payer: Medicare Other

## 2022-01-30 DIAGNOSIS — I480 Paroxysmal atrial fibrillation: Secondary | ICD-10-CM

## 2022-01-30 MED ORDER — DILTIAZEM HCL ER COATED BEADS 180 MG PO CP24
180.0000 mg | ORAL_CAPSULE | Freq: Every day | ORAL | Status: DC
  Administered 2022-01-30 – 2022-01-31 (×2): 180 mg via ORAL
  Filled 2022-01-30 (×2): qty 1

## 2022-01-30 MED ORDER — DILTIAZEM LOAD VIA INFUSION
15.0000 mg | Freq: Once | INTRAVENOUS | Status: AC
  Administered 2022-01-30: 15 mg via INTRAVENOUS
  Filled 2022-01-30: qty 15

## 2022-01-30 MED ORDER — HEPARIN SODIUM (PORCINE) 1000 UNIT/ML IJ SOLN
INTRAMUSCULAR | Status: AC
  Filled 2022-01-30: qty 4

## 2022-01-30 MED ORDER — DILTIAZEM HCL-DEXTROSE 125-5 MG/125ML-% IV SOLN (PREMIX)
5.0000 mg/h | INTRAVENOUS | Status: DC
  Administered 2022-01-30: 5 mg/h via INTRAVENOUS
  Filled 2022-01-30: qty 125

## 2022-01-30 NOTE — Progress Notes (Signed)
Patient declines for family member to be notified of his transfer to another unit.

## 2022-01-30 NOTE — NC FL2 (Signed)
[place] LEVEL OF CARE SCREENING TOOL     IDENTIFICATION  Patient Name: Tanner Rollins Birthdate: 19-Aug-1951 Sex: male Admission Date (Current Location): 01/23/2022  [facility] and [place] Number:  Herbalist and Address:  [facility], [place] [address] [place], [place] [address]      Provider Number: [id number]  Attending Physician Name and Address:  Shelly Coss, MD  Relative Name and Phone Number:  Hildred Laser (Sister) [phone]    Current Level of Care: Hospital Recommended Level of Care: [place] Prior Approval Number:    Date Approved/Denied:   PASRR Number: [id number]  Discharge Plan: SNF    Current Diagnoses: Patient Active Problem List   Diagnosis Date Noted   High anion gap metabolic acidosis    Hypertensive emergency 01/23/2022   Chest pain    AKI (acute kidney injury) (Portal)    CKD (chronic kidney disease) stage 4, GFR 15-29 ml/min (HCC) 03/12/2021   Enlarged prostate without urinary obstruction 03/12/2021   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   Severe hypertension 03/11/2021    Orientation RESPIRATION BLADDER Height & Weight     Self, Time, Situation, Place  O2 ([place] 2) Continent Weight: 163 lb 2.3 oz (74 kg) Height:  5' 11.5" (181.6 cm)  BEHAVIORAL SYMPTOMS/MOOD NEUROLOGICAL BOWEL NUTRITION STATUS      Continent Diet (See DC summary)  AMBULATORY STATUS COMMUNICATION OF NEEDS Skin   Limited Assist Verbally Surgical wounds (L arm incision)                       Personal Care Assistance Level of Assistance  Bathing, Feeding, Dressing Bathing Assistance: Limited assistance Feeding assistance: Independent Dressing Assistance: Limited assistance     Functional Limitations Info  Sight, Hearing, Speech Sight Info: Adequate Hearing Info: Adequate Speech Info: Adequate    SPECIAL CARE FACTORS FREQUENCY  PT (By licensed PT), OT (By licensed OT)     PT Frequency: 5x week OT  Frequency: 5x week            Contractures Contractures Info: Not present    Additional Factors Info  Code Status, Allergies Code Status Info: Full Allergies Info: NKA           Current Medications (01/30/2022):  This is the current hospital active medication list Current Facility-Administered Medications  Medication Dose Route Frequency Provider Last Rate Last Admin   0.9 %  sodium chloride infusion (Manually program via Guardrails IV Fluids)   Intravenous Once Rhyne, Samantha J, PA-C       acetaminophen (TYLENOL) tablet 650 mg  650 mg Oral Q6H PRN Rhyne, Samantha J, PA-C       anticoagulant sodium citrate solution 5 mL  5 mL Dialysis PRN Rhyne, Samantha J, PA-C       aspirin EC tablet 81 mg  81 mg Oral Daily Rhyne, Samantha J, PA-C   81 mg at 01/30/22 4469   calcitRIOL (ROCALTROL) capsule 0.25 mcg  0.25 mcg Oral Q M,W,F Dwana Melena, MD       calcium acetate (PHOSLO) capsule 1,334 mg  1,334 mg Oral TID WC Rhyne, Samantha J, PA-C   1,334 mg at 01/30/22 0805   carvedilol (COREG) tablet 25 mg  25 mg Oral BID WC Rhyne, Samantha J, PA-C   25 mg at 01/29/22 1838   Chlorhexidine Gluconate Cloth 2 % PADS 6 each  6 each Topical Q0600 Rhyne, Samantha J, PA-C   6 each  at 01/30/22 0548   Darbepoetin Alfa (ARANESP) injection 150 mcg  150 mcg Intravenous Q Wed-HD Dwana Melena, MD       diltiazem (CARDIZEM CD) 24 hr capsule 180 mg  180 mg Oral Daily Bhagat, Bhavinkumar, PA   180 mg at 01/30/22 1413   docusate sodium (COLACE) capsule 100 mg  100 mg Oral BID PRN Rhyne, Samantha J, PA-C       docusate sodium (COLACE) capsule 100 mg  100 mg Oral BID Rhyne, Samantha J, PA-C   100 mg at 01/29/22 2206   fentaNYL (SUBLIMAZE) injection 25 mcg  25 mcg Intravenous Q4H PRN Barb Merino, MD   25 mcg at 01/28/22 1408   heparin injection 5,000 Units  5,000 Units Subcutaneous Q8H Rhyne, Samantha J, PA-C   5,000 Units at 01/30/22 1413   MEDLINE mouth rinse  15 mL Mouth Rinse BID Rhyne, Samantha J, PA-C   15 mL  at 01/30/22 0806   ondansetron (ZOFRAN) injection 4 mg  4 mg Intravenous Q6H PRN Barb Merino, MD   4 mg at 01/28/22 1351   oxyCODONE-acetaminophen (PERCOCET/ROXICET) 5-325 MG per tablet 1 tablet  1 tablet Oral Q6H PRN Rhyne, Hulen Shouts, PA-C   1 tablet at 01/29/22 1947   polyethylene glycol (MIRALAX / GLYCOLAX) packet 17 g  17 g Oral Daily PRN Rhyne, Samantha J, PA-C       polyethylene glycol (MIRALAX / GLYCOLAX) packet 17 g  17 g Oral Daily Rhyne, Samantha J, PA-C   17 g at 01/29/22 1047   sodium chloride flush (NS) 0.9 % injection 10-40 mL  10-40 mL Intracatheter Q12H Rhyne, Samantha J, PA-C   10 mL at 01/30/22 0806   sodium chloride flush (NS) 0.9 % injection 10-40 mL  10-40 mL Intracatheter PRN Gabriel Earing, PA-C         Discharge Medications: Please see discharge summary for a list of discharge medications.  Relevant Imaging Results:  Relevant Lab Results:   Additional Information SS# [id number] [place] on TTS 10:55 chair time  Coralee Pesa, LCSWA

## 2022-01-30 NOTE — Progress Notes (Signed)
   01/30/22 0627  Assess: MEWS Score  Temp 98.1 F (36.7 C)  BP (!) 79/59  MAP (mmHg) 66  Pulse Rate (!) 150  Resp 19  SpO2 93 %  O2 Device Nasal Cannula  O2 Flow Rate (L/min) 2 L/min  Assess: MEWS Score  MEWS Temp 0  MEWS Systolic 2  MEWS Pulse 3  MEWS RR 0  MEWS LOC 0  MEWS Score 5  MEWS Score Color Red  Assess: if the MEWS score is Yellow or Red  Were vital signs taken at a resting state? Yes  Focused Assessment Change from prior assessment (see assessment flowsheet)  Does the patient meet 2 or more of the SIRS criteria? No  MEWS guidelines implemented *See Row Information* Yes  Treat  MEWS Interventions Administered prn meds/treatments  Pain Scale 0-10  Pain Score 9  Pain Type Acute pain  Pain Location Chest  Pain Orientation Medial  Pain Descriptors / Indicators Heaviness  Pain Frequency Constant  Pain Onset Gradual  Patients Stated Pain Goal 0  Pain Intervention(s) Medication (See eMAR)  Take Vital Signs  Increase Vital Sign Frequency  Red: Q 1hr X 4 then Q 4hr X 4, if remains red, continue Q 4hrs  Escalate  MEWS: Escalate Red: discuss with charge nurse/RN and provider, consider discussing with RRT  Notify: Charge Nurse/RN  Name of Charge Nurse/RN Notified [place]  Date Charge Nurse/RN Notified 01/30/22  Time Charge Nurse/RN Notified 0630  Notify: Provider  Provider Name/Title Gardner,MD  Date Provider Notified 01/30/22  Time Provider Notified 0630  Method of Notification Page (secure chat)  Notification Reason Change in status  Provider response See new orders  Date of Provider Response 01/30/22  Time of Provider Response 0630  Notify: Rapid Response  Name of Rapid Response RN Notified David,RN  Date Rapid Response Notified 01/30/22  Time Rapid Response Notified  (already at bedside)  Document  Patient Outcome Not stable and remains on department  Progress note created (see row info) Yes  Assess: SIRS CRITERIA  SIRS Temperature  0  SIRS Pulse  1  SIRS Respirations  0  SIRS WBC 0  SIRS Score Sum  1

## 2022-01-30 NOTE — Progress Notes (Signed)
Patient ID: Tanner Rollins, male   DOB: 1951-04-03, 71 y.o.   MRN: [id number]  Assessment/Plan:   ESRD - already advanced CKD stage IV secondary to poorly controlled HTN for the past year and now progressed to ESRD.  Strong family history of ESRD.  Renal US consistent with chronic medical renal disease.  No obstruction or hydro.  Tunneled HD catheter placed 01/24/22 and first HD session afterwards.  Appreciate single stage lt BBT by Dr. Scot Dock 6/12; tolerated HD on 6/12 w/ no cramping and nausea better afterwards.   - Renal navigator aware and still waiting for an outpt chair. Patient is waiting for transfer for afib RVR overnight to start cardizem gtt. On 2L [place]  - MWF regimen while in the hospital.  Hypertensive emergency - markedly improved initially on  Cardene gtt.  Appreciate PCCM's assistance.  Now off of cardene gtt and on amlodipine 10 mg, carvedilol 25 mg bid, hydralazine 50 mg q6. Hyperkalemia - improved with lasix and lokelma. Acute hypoxic respiratory failure - wean supplemental oxygen.  CXR with bilateral lower lobe airspace disease worrisome for infection.  Small left pleural effusion, moderate right pleural effusion.  Antibiotics per PCCM.  Will UF with next HD session but may need thoracentesis.  ECHO with normal EF, Grade I DD, elevated LVEDP and pulmonary HTN. Anemia of chronic disease (ESRD) - iron 37, TSAT 19%, ferritin 654.  S/p blood transfusion.   Started IV iron and ESA and follow; aranesp 150 qwed. CKD-MBD - iPTH pending but with low calcium, started on calcitriol 0.25 MWF. Phoslo 2 tabs TIDM. Eating now. Vascular access- RIJ [facility] 01/24/22 and lt BBT (single stage) placement 01/28/22 by Dr. Scot Dock.  Disposition - he will need outpatient HD arrangements prior to discharge as he is likely ESRD.  S: Feels weak and tired, nausea improved with hd. Afib RVR overnight with sob and hypotension.   O:BP 102/82 (BP Location: Right Arm)   Pulse (!) 137   Temp 98.1 F (36.7 C)   Resp  20   Ht 5' 11.5" (1.816 m)   Wt 75.4 kg   SpO2 97%   BMI 22.86 kg/m   Intake/Output Summary (Last 24 hours) at 01/30/2022 0734 Last data filed at 01/30/2022 0520 Gross per 24 hour  Intake 800 ml  Output 0 ml  Net 800 ml   Intake/Output: I/O last 3 completed shifts: In: 860 [P.O.:860] Out: 200 [Urine:200]  Intake/Output this shift:  No intake/output data recorded. Weight change:  Gen:NAD CVS: RRR Resp: CTA Abd: +BS, soft, NT/ND Ext: no edema ACCESS: lt BBT w/ good bruit, RIJ TC  Recent Labs  Lab 01/23/22 1515 01/23/22 1527 01/23/22 1850 01/23/22 2147 01/24/22 0017 01/24/22 0611 01/25/22 0122 01/26/22 0258 01/28/22 0436  NA 137   < > 137 140 138 135 135 132* 134*  K 5.0   < > 5.7* 6.4* 4.5 5.1 4.0 4.9 4.1  CL 108  --  108 106 106  --  98 98 96*  CO2 9*  --  8* 15* 12*  --  19* 20* 23  GLUCOSE 135*  --  128* 160* 172*  --  97 88 95  BUN 140*  --  150* 149* 145*  --  83* 108* 86*  CREATININE 26.11*  --  27.02* 24.77* 24.40*  --  14.28* 17.93* 15.36*  ALBUMIN 3.0*  --  2.9*  --   --   --   --   --   --   CALCIUM 8.9  --  8.9 8.3* 8.8*  --  8.3* 8.2* 8.4*  PHOS  --   --  9.0*  --  9.1*  --  6.3* 8.9*  --   AST 24  --   --   --   --   --   --   --   --   ALT 14  --   --   --   --   --   --   --   --    < > = values in this interval not displayed.   Liver Function Tests: Recent Labs  Lab 01/23/22 1515 01/23/22 1850  AST 24  --   ALT 14  --   ALKPHOS 81  --   BILITOT 0.9  --   PROT 6.2*  --   ALBUMIN 3.0* 2.9*   Recent Labs  Lab 01/23/22 1515  LIPASE 210*   No results for input(s): "AMMONIA" in the last 168 hours. CBC: Recent Labs  Lab 01/23/22 1515 01/23/22 1527 01/23/22 2147 01/24/22 0017 01/24/22 0611 01/25/22 0122 01/26/22 0258 01/28/22 0436  WBC 10.0  --  12.6* 12.5*  --  13.8* 11.1* 8.4  NEUTROABS 8.0*  --   --   --   --   --   --   --   HGB 8.0*   < > 6.4* 6.9*   < > 7.8* 7.7* 7.4*  HCT 24.2*   < > 19.4* 20.3*   < > 22.0* 22.6* 22.5*   MCV 85.2  --  82.9 81.9  --  82.1 82.2 85.9  PLT 132*  --  122* 119*  --  136* 137* 217   < > = values in this interval not displayed.   Cardiac Enzymes: No results for input(s): "CKTOTAL", "CKMB", "CKMBINDEX", "TROPONINI" in the last 168 hours. CBG: Recent Labs  Lab 01/23/22 1912 01/24/22 0421  GLUCAP 115* 161*    Iron Studies: No results for input(s): "IRON", "TIBC", "TRANSFERRIN", "FERRITIN" in the last 72 hours. Studies/Results: No results found.   sodium chloride   Intravenous Once   aspirin EC  81 mg Oral Daily   calcitRIOL  0.25 mcg Oral Q M,W,F   calcium acetate  1,334 mg Oral TID WC   carvedilol  25 mg Oral BID WC   Chlorhexidine Gluconate Cloth  6 each Topical Q0600   darbepoetin (ARANESP) injection - DIALYSIS  150 mcg Intravenous Q Wed-HD   diltiazem  15 mg Intravenous Once   docusate sodium  100 mg Oral BID   heparin injection (subcutaneous)  5,000 Units Subcutaneous Q8H   hydrALAZINE  50 mg Oral Q6H   mouth rinse  15 mL Mouth Rinse BID   polyethylene glycol  17 g Oral Daily   sodium chloride flush  10-40 mL Intracatheter Q12H    BMET    Component Value Date/Time   NA 134 (L) 01/28/2022 0436   K 4.1 01/28/2022 0436   CL 96 (L) 01/28/2022 0436   CO2 23 01/28/2022 0436   GLUCOSE 95 01/28/2022 0436   BUN 86 (H) 01/28/2022 0436   CREATININE 15.36 (H) 01/28/2022 0436   CALCIUM 8.4 (L) 01/28/2022 0436   GFRNONAA 3 (L) 01/28/2022 0436   CBC    Component Value Date/Time   WBC 8.4 01/28/2022 0436   RBC 2.62 (L) 01/28/2022 0436   HGB 7.4 (L) 01/28/2022 0436   HCT 22.5 (L) 01/28/2022 0436   PLT 217 01/28/2022 0436   MCV 85.9 01/28/2022 0436  MCH 28.2 01/28/2022 0436   MCHC 32.9 01/28/2022 0436   RDW 16.0 (H) 01/28/2022 0436   LYMPHSABS 1.1 01/23/2022 1515   MONOABS 0.8 01/23/2022 1515   EOSABS 0.0 01/23/2022 1515   BASOSABS 0.0 01/23/2022 1515

## 2022-01-30 NOTE — Consult Note (Addendum)
Cardiology Consultation:   Patient ID: Tanner Rollins MRN: [id number]; DOB: [birth year]  Admit date: 01/23/2022 Date of Consult: 01/30/2022  PCP:  Tanner Rollins, No   [facility] Providers Cardiologist:  NEW  Patient Profile:   Tanner Rollins is a 71 y.o. male with a hx of hypertension and chronic kidney disease who is being seen 01/30/2022 for the evaluation of atrial fibrillation with rapid ventricular rate at the request of Tanner Rollins.  History of Present Illness:   Tanner Rollins patient with longstanding history of hypertension who presented 6/7 with worsening intermittent chest pain and shortness of breath.  Described pain as pressure-like sensation.  He has stopped taking his antihypertensive due to financial restraints for months.  He was found to have hypertensive urgency with blood pressure of 228/130.  BNP 2684, troponin 323 and lipase 220.  Creatinine was up to 26 from baseline of 4-5.  He was admitted to ICU for hypertensive emergency and acute on chronic kidney disease.  He was started on nitroglycerin and Cardene drip>> transition to carvedilol, hydralazine and amlodipine.  Seen by nephrology for CKD and started on hemodialysis this admission.  Initially he was treated with antibiotic as well for possible aspiration pneumonia.  He has completed antibiotic course.  Last chest x-ray 6/12 with improved radiation and moderate pleural effusion.  He was given IV iron and blood transfusion this admission.  Morning around 5:30 AM patient went into atrial fibrillation with rapid ventricular rate at a heart rate of 130s.  Also had a brief atrial fibrillation around midnight for few seconds.  He did felt palpitation with shortness of breath.  He is started on IV diltiazem load and infusion with successful conversion to sinus rhythm.  He dropped his blood pressure and his antihypertensive held this morning.  Now blood pressure improved to 130/85.  Patient denies use of cocaine.  Social alcohol use.   Occasional marijuana smoking.  No prior history of cardiac problems.  Echo 01/24/22 1. Left ventricular ejection fraction, by estimation, is 55 to 60%. The  left ventricle has normal function. The left ventricle has no regional  wall motion abnormalities. There is moderate concentric left ventricular  hypertrophy. Left ventricular  diastolic parameters are consistent with Grade I diastolic dysfunction  (impaired relaxation). Elevated left ventricular end-diastolic pressure.   2. Right ventricular systolic function is normal. The right ventricular  size is normal. There is moderately elevated pulmonary artery systolic  pressure.   3. Left atrial size was severely dilated.   4. Right atrial size was mildly dilated.   5. The mitral valve is normal in structure. Trivial mitral valve  regurgitation. No evidence of mitral stenosis.   6. The aortic valve is tricuspid. Aortic valve regurgitation is not  visualized. No aortic stenosis is present.   7. The inferior vena cava is dilated in size with <50% respiratory  variability, suggesting right atrial pressure of 15 mmHg.   Past Medical History:  Diagnosis Date   CKD (chronic kidney disease) stage 4, GFR 15-29 ml/min (HCC)    Hypertension     Past Surgical History:  Procedure Laterality Date   AV FISTULA PLACEMENT Left 01/28/2022   Procedure: ARTERIOVENOUS (AV) FISTULA CREATION VS.GRAFT ARM;  Surgeon: Tanner Mould, MD;  Location: [facility];  Service: Vascular;  Laterality: Left;   IR FLUORO GUIDE CV LINE RIGHT  01/24/2022   IR US GUIDE VASC ACCESS RIGHT  01/24/2022    Inpatient Medications: Scheduled Meds:  sodium chloride   Intravenous Once  aspirin EC  81 mg Oral Daily   calcitRIOL  0.25 mcg Oral Q M,W,F   calcium acetate  1,334 mg Oral TID WC   carvedilol  25 mg Oral BID WC   Chlorhexidine Gluconate Cloth  6 each Topical Q0600   darbepoetin (ARANESP) injection - DIALYSIS  150 mcg Intravenous Q Wed-HD   docusate sodium  100 mg  Oral BID   heparin injection (subcutaneous)  5,000 Units Subcutaneous Q8H   mouth rinse  15 mL Mouth Rinse BID   polyethylene glycol  17 g Oral Daily   sodium chloride flush  10-40 mL Intracatheter Q12H   Continuous Infusions:  anticoagulant sodium citrate     diltiazem (CARDIZEM) infusion Stopped (01/30/22 1236)   PRN Meds: acetaminophen, anticoagulant sodium citrate, docusate sodium, fentaNYL (SUBLIMAZE) injection, ondansetron (ZOFRAN) IV, oxyCODONE-acetaminophen, polyethylene glycol, sodium chloride flush  Allergies:   No Known Allergies  Social History:   Social History   Socioeconomic History   Marital status: Single    Spouse name: Not on file   Number of children: Not on file   Years of education: Not on file   Highest education level: Not on file  Occupational History   Not on file  Tobacco Use   Smoking status: Some Days    Types: Cigarettes   Smokeless tobacco: Not on file  Substance and Sexual Activity   Alcohol use: Not on file   Drug use: Not on file   Sexual activity: Not on file  Other Topics Concern   Not on file  Social History Narrative   Not on file   Social Determinants of Health   Financial Resource Strain: Not on file  Food Insecurity: Not on file  Transportation Needs: Not on file  Physical Activity: Not on file  Stress: Not on file  Social Connections: Not on file  Intimate Partner Violence: Not on file    Family History:   History reviewed. No pertinent family history.  Reports family history of hypertension but unable to provide any specific details.  ROS:  Please see the history of present illness.  All other ROS reviewed and negative.     Physical Exam/Data:   Vitals:   01/30/22 0900 01/30/22 1000 01/30/22 1045 01/30/22 1237  BP:   130/85 131/80  Pulse: 84 (!) 37 66   Resp: (!) 0 (!) 0 15   Temp:   98.4 F (36.9 C)   TempSrc:   Oral   SpO2: 97% 96% 96%   Weight:      Height:        Intake/Output Summary (Last 24  hours) at 01/30/2022 1319 Last data filed at 01/30/2022 0520 Gross per 24 hour  Intake 360 ml  Output 0 ml  Net 360 ml      01/28/2022    4:03 PM 01/28/2022    4:33 AM 01/27/2022    5:00 AM  Last 3 Weights  Weight (lbs) 166 lb 3.6 oz 173 lb 4.5 oz 172 lb 13.5 oz  Weight (kg) 75.4 kg 78.6 kg 78.4 kg     Body mass index is 22.86 kg/m.  General:  Well nourished, well developed, in no acute distress HEENT: normal Neck: no JVD Vascular: No carotid bruits; Distal pulses 2+ bilaterally Cardiac:  normal S1, S2; RRR; no murmur  Lungs: Diminished breath sound  abd: soft, nontender, no hepatomegaly  Ext: no edema Musculoskeletal:  No deformities, BUE and BLE strength normal and equal Skin: warm and dry  Neuro:  CNs 2-12 intact, no focal abnormalities noted Psych:  Normal affect   EKG:  The EKG was personally reviewed and demonstrates: Atrial fibrillation with rapid ventricular rate Telemetry:  Telemetry was personally reviewed and demonstrates: Sinus rhythm  Relevant CV Studies: As summarized above  Laboratory Data:  High Sensitivity Troponin:   Recent Labs  Lab 01/23/22 1515 01/23/22 1650  TROPONINIHS 323* 304*     Chemistry Recent Labs  Lab 01/24/22 0017 01/24/22 0611 01/25/22 0122 01/26/22 0258 01/28/22 0436  NA 138   < > 135 132* 134*  K 4.5   < > 4.0 4.9 4.1  CL 106  --  98 98 96*  CO2 12*  --  19* 20* 23  GLUCOSE 172*  --  97 88 95  BUN 145*  --  83* 108* 86*  CREATININE 24.40*  --  14.28* 17.93* 15.36*  CALCIUM 8.8*  --  8.3* 8.2* 8.4*  MG 2.2  --  1.7 2.0  --   GFRNONAA 2*  --  3* 3* 3*  ANIONGAP 20*  --  18* 14 15   < > = values in this interval not displayed.    Recent Labs  Lab 01/23/22 1515 01/23/22 1850  PROT 6.2*  --   ALBUMIN 3.0* 2.9*  AST 24  --   ALT 14  --   ALKPHOS 81  --   BILITOT 0.9  --    Hematology Recent Labs  Lab 01/25/22 0122 01/26/22 0258 01/28/22 0436  WBC 13.8* 11.1* 8.4  RBC 2.68* 2.75* 2.62*  HGB 7.8* 7.7* 7.4*   HCT 22.0* 22.6* 22.5*  MCV 82.1 82.2 85.9  MCH 29.1 28.0 28.2  MCHC 35.5 34.1 32.9  RDW 16.6* 16.4* 16.0*  PLT 136* 137* 217   BNP Recent Labs  Lab 01/23/22 1515  BNP 2,684.7*   Radiology/Studies:  DG CHEST PORT 1 VIEW  Result Date: 01/28/2022 CLINICAL DATA:  Shortness of breath. EXAM: PORTABLE CHEST 1 VIEW COMPARISON:  01/25/2022. FINDINGS: 4:39 a.m., 01/29/2019. Right IJ dialysis catheter tip remains in the upper right atrium. The heart is enlarged. Mild central vascular prominence with slight basilar interstitial edema continue to be noted with moderate pleural effusions. There are opacities in the lung bases which again could represent atelectasis, or patchy consolidation such as due to pneumonia, aspiration or combination. The mid and upper lung fields are clear with COPD change. There is aortic atherosclerosis and tortuosity. Either the pleural effusions are mildly improved or redistributed. In all other respects there are no further changes. IMPRESSION: There is mild improvement in aeration of the bases but there are still patchy consolidative or atelectatic opacities overlying moderate pleural effusions. The pleural effusions may have mildly improved versus redistributed. Perihilar vascular congestion and slight basal interstitial edema appear similar. Electronically Signed   By: Telford Nab M.D.   On: 01/28/2022 06:36     Assessment and Plan:   Atrial fibrillation with rapid ventricular rate -Went into atrial fibrillation this morning with hypotension and symptoms of palpitation with shortness of breath.  Given IV Cardizem with conversion to sinus rhythm.  His [place] Vascor is 2 for age and hypertension.  Will review anticoagulation with MD.  Cost could be issue for long-term anticoagulation. -Echocardiogram this admission showed preserved LV function with grade 1 diastolic dysfunction.  Normal RV function.  Severely dilated left atrium. We will check TSH. -Patient was treated  with antibiotic earlier this admission for possible aspiration pneumonia.  Last chest x-ray 6/12 with  bilateral pleural effusion.  Will need repeat chest x-ray to rule out any infection or worsening effusion to rule out potential cause. -Will start him on Cardizem CD 180 mg daily.  If blood pressure tolerates start carvedilol.  Hold amlodipine. -Will not start anticoagulation as of now at this could be isolated episode.  Suspected noncompliance as well. He will need monitor at discharge.  2.  Hypertension with urgency -Initially required Cardene and nitroglycerin drip with ICU admission. -His blood pressure was stabilized on carvedilol, amlodipine and hydralazine but had A-fib RVR with hypotension this morning requiring to hold. -Blood pressure now improving.  Resume medication gradually as tolerated.  As above.   3.  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  Pleural effusion /acute hypoxic respiratory failure -Recommended repeat chest x-ray -Still requiring oxygen    Risk Assessment/Risk Scores:   CHA2DS2-VASc Score = 2  This indicates a 2.2% annual risk of stroke. The patient's score is based upon: CHF History: 0 HTN History: 1 Diabetes History: 0 Stroke History: 0 Vascular Disease History: 0 Age Score: 1 Gender Score: 0     For questions or updates, please contact [place] Please consult [email or web address] for contact info under    Signed, Leanor Kail, PA  01/30/2022 1:19 PM   Agree with note by Robbie Lis PA-C  We are asked to see Mr. Gura for a brief episode of PAF.  He is a 71 year old gentleman with poorly treated hypertension that was admitted with hypertensive urgency and placed on IV antihypertensive medications.  He also has chronic renal insufficiency which has progressed to renal failure requiring renal replacement therapy/hemodialysis.  His blood pressures have been better controlled on antihypertensive  medications orally in the hospital.  He did have a brief episode of A-fib with RVR that resolved with IV diltiazem.  Currently he is in sinus rhythm.The CHA2DSVASC2 score is 2  .  His exam is benign.  At this point, I do not think he needs to be started on long-term oral anticoagulation for 1 episode of PAF.  We can change his amlodipine to Cardizem.  I question his medication compliance after discharge.  Lorretta Harp, M.D., [place], [facility], [place] [address] [place], [place]  [address]  [phone] 01/30/2022 3:09 PM

## 2022-01-30 NOTE — Progress Notes (Signed)
Received patient in bed, alert and oriented. Informed consent signed and in chart.  Time tx initiated:1600  Pre HD weight:74 kg  Pre HD VS:125/68  Time tx completed:2000  HD treatment completed. Patient tolerated well. Fistula/Graft/HD catheter without signs and symptoms of complications. Patient transported back to the room, alert and orient and in no acute distress. Report given to bedside RN.  Total UF removed:2000 ml  Medication given:Calcitriol 0.25 mcg. Aranesp 150 mcg.  Post HD VS:126/64  Post HD weight: 72.2kg

## 2022-01-30 NOTE — Progress Notes (Signed)
PROGRESS NOTE  Tanner Rollins  VFI:[id number] DOB: [birth year] DOA: 01/23/2022 PCP: Pcp, No   Brief Narrative:  Patient is a 71 year old male with past medical history of hypertension, CKD stage IV who presented with chest pain, hypertension.  Patient initially needed nitroglycerin /Cardene drip and was admitted to ICU for further management of severe hypertension.  Lab work showed severe AKI on CKD.  Nephrology consulted and started on hemodialysis.  He was transferred out of ICU.  PT/OT recommended skilled nursing facility on discharge, he is waiting for outpatient dialysis arrangement.  Hospital course remarkable for new onset A-fib with RVR in the early morning of 6/14.  Cardiology consulted  Assessment & Plan:  Principal Problem:   Hypertensive emergency Active Problems:   Chest pain   AKI (acute kidney injury) ([facility])   High anion gap metabolic acidosis  A-fib with RVR: New onset.  Heart rate went into 130-140 earlier this morning, started on Cardizem drip.  Cardiology consulted.  Monitor on telemetry.  Patient transferred to 3 E.  Hypertensive emergency: Presented with severe hypertension and was started on Cardene drip and was transferred to ICU.  Currently hypotensive.  Monitor blood pressure.  On amlodipine, carvedilol, hydralazine for high blood pressure: Amlodipine, hydralazine will be  held because of hypotension.  AKI on CKD stage IV: Presented with worsening renal function.  Started on dialysis here.  Has AV  fistula on the left arm.  Waiting for outpatient dialysis chair.  Renal navigator following.  Progression of CKD is most likely secondary to poorly controlled diabetes.  Renal ultrasound consistent with chronic medical renal disease. Hyperkalemia improved with Lasix, Lokelma  Acute hypoxic respiratory failure: Initially chest x-ray showed bilateral lower lobe opacity worrisome for infection.  Suspect aspiration pneumonia.  Imagings also showed a small left pleural effusion,  moderate right pleural effusion.  Respiratory status improved with dialysis.  Completed antibiotics course.  Currently on 2 L of oxygen per minute, we will try to wean   Anemia of chronic disease: Patient was given blood transfusion during this hospitalization.  Started on IV iron, ESA.  On aranesp.  Hemoglobin stable in the range of 7.  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  Very deconditioned.  He was previously living with his sister, was able to walk without any problems.  Waiting for placement       DVT prophylaxis:heparin injection 5,000 Units Start: 01/24/22 0930 SCDs Start: 01/23/22 1729     Code Status: Full Code  Family Communication: None  Patient status:Inpatient  Patient is from :Home  Anticipated discharge to:SNF  Estimated DC date: Needs complete work-up for A-fib with RVR, needs outpatient dialysis chair, skin hospital bed.   Consultants: PCCM, nephrology, cardiology  Procedures: Dialysis, hemodialysis catheter placement, fistula placement  Antimicrobials:  Anti-infectives (From admission, onward)    Start     Dose/Rate Route Frequency Ordered Stop   01/24/22 1400  ceFAZolin (ANCEF) IVPB 2g/100 mL premix        2 g 200 mL/hr over 30 Minutes Intravenous To Radiology 01/24/22 1314 01/24/22 1537   01/24/22 1000  cefTRIAXone (ROCEPHIN) 2 g in sodium chloride 0.9 % 100 mL IVPB        2 g 200 mL/hr over 30 Minutes Intravenous Every 24 hours 01/24/22 0912 01/28/22 1112       Subjective: Patient seen and examined at the bedside this morning.  Went into A-fib with RVR early this morning.  During my  evaluation, his heart rate was well controlled in the range of 80s, still with irregular rhythm.  Denies any chest pain, shortness of breath or cough.  Looks very weak, deconditioned, lying in bed  Objective: Vitals:   01/30/22 0630 01/30/22 0642 01/30/22 0701 01/30/22 0832  BP:  (!) 74/57 134/89 102/82 92/69  Pulse: (!) 130 (!) 121 (!) 137   Resp: 19  20 18   Temp: 98.1 F (36.7 C)   98.6 F (37 C)  TempSrc:    Oral  SpO2: 94% 95% 97% 94%  Weight:      Height:        Intake/Output Summary (Last 24 hours) at 01/30/2022 0934 Last data filed at 01/30/2022 0520 Gross per 24 hour  Intake 600 ml  Output 0 ml  Net 600 ml   Filed Weights   01/27/22 0500 01/28/22 0433 01/28/22 1603  Weight: 78.4 kg 78.6 kg 75.4 kg    Examination:  General exam: Very deconditioned, chronically ill looking HEENT: PERRL Respiratory system:  no wheezes or crackles  Cardiovascular system: Irregularly irregular rhythm, temporary dialysis catheter on the right chest Gastrointestinal system: Abdomen is nondistended, soft and nontender. Central nervous system: Alert and oriented Extremities: No edema, no clubbing ,no cyanosis, AV fistula on the left arm Skin: No rashes, no ulcers,no icterus     Data Reviewed: I have personally reviewed following labs and imaging studies  CBC: Recent Labs  Lab 01/23/22 1515 01/23/22 1527 01/23/22 2147 01/24/22 0017 01/24/22 0611 01/25/22 0122 01/26/22 0258 01/28/22 0436  WBC 10.0  --  12.6* 12.5*  --  13.8* 11.1* 8.4  NEUTROABS 8.0*  --   --   --   --   --   --   --   HGB 8.0*   < > 6.4* 6.9* 7.1* 7.8* 7.7* 7.4*  HCT 24.2*   < > 19.4* 20.3* 21.0* 22.0* 22.6* 22.5*  MCV 85.2  --  82.9 81.9  --  82.1 82.2 85.9  PLT 132*  --  122* 119*  --  136* 137* 217   < > = values in this interval not displayed.   Basic Metabolic Panel: Recent Labs  Lab 01/23/22 1850 01/23/22 2147 01/24/22 0017 01/24/22 9518 01/25/22 0122 01/26/22 0258 01/28/22 0436  NA 137 140 138 135 135 132* 134*  K 5.7* 6.4* 4.5 5.1 4.0 4.9 4.1  CL 108 106 106  --  98 98 96*  CO2 8* 15* 12*  --  19* 20* 23  GLUCOSE 128* 160* 172*  --  97 88 95  BUN 150* 149* 145*  --  83* 108* 86*  CREATININE 27.02* 24.77* 24.40*  --  14.28* 17.93* 15.36*  CALCIUM 8.9 8.3* 8.8*  --   8.3* 8.2* 8.4*  MG  --   --  2.2  --  1.7 2.0  --   PHOS 9.0*  --  9.1*  --  6.3* 8.9*  --      Recent Results (from the past 240 hour(s))  MRSA Next Gen by PCR, Nasal     Status: None   Collection Time: 01/25/22 11:01 AM   Specimen: Nasal Mucosa; Nasal Swab  Result Value Ref Range Status   MRSA by PCR Next Gen NOT DETECTED NOT DETECTED Final    Comment: (NOTE) The GeneXpert MRSA Assay (FDA approved for NASAL specimens only), is one component of a comprehensive MRSA colonization surveillance program. It is not intended to diagnose MRSA infection nor to guide or monitor  treatment for MRSA infections. Test performance is not FDA approved in patients less than 5 years old. Performed at [facility], [place] [address], [place], [place] [address]   Surgical PCR screen     Status: None   Collection Time: 01/27/22  8:56 PM   Specimen: Nasal Mucosa; Nasal Swab  Result Value Ref Range Status   MRSA, PCR NEGATIVE NEGATIVE Final   Staphylococcus aureus NEGATIVE NEGATIVE Final    Comment: (NOTE) The Xpert SA Assay (FDA approved for NASAL specimens in patients 61 years of age and older), is one component of a comprehensive surveillance program. It is not intended to diagnose infection nor to guide or monitor treatment. Performed at [facility], [place] [address], [place], [place] [address]      Radiology Studies: No results found.  Scheduled Meds:  sodium chloride   Intravenous Once   aspirin EC  81 mg Oral Daily   calcitRIOL  0.25 mcg Oral Q M,W,F   calcium acetate  1,334 mg Oral TID WC   carvedilol  25 mg Oral BID WC   Chlorhexidine Gluconate Cloth  6 each Topical Q0600   darbepoetin (ARANESP) injection - DIALYSIS  150 mcg Intravenous Q Wed-HD   docusate sodium  100 mg Oral BID   heparin injection (subcutaneous)  5,000 Units Subcutaneous Q8H   hydrALAZINE  50 mg Oral Q6H   mouth rinse  15 mL Mouth Rinse BID   polyethylene glycol  17 g Oral Daily   sodium chloride  flush  10-40 mL Intracatheter Q12H   Continuous Infusions:  anticoagulant sodium citrate     diltiazem (CARDIZEM) infusion 5 mg/hr (01/30/22 0837)     LOS: 7 days   Shelly Coss, MD [facility] P6/14/2023, 9:34 AM

## 2022-01-30 NOTE — Progress Notes (Signed)
Physical Therapy Treatment Patient Details Name: Tanner Rollins MRN: [id number] DOB: 1951-06-23 Today's Date: 01/30/2022   History of Present Illness pt is a 71 y/o male presenting with worsening chest pain.  Hospital course includes admit to ICU for hypertensive emergency and Acute on chronic renal failue.  Pt moving now toward HD, 6/8 tunneled catheter placed, dialysis started.  PMH:  HTN, CKD.    PT Comments    Continuing work on functional mobility and activity tolerance;  Pt agreeing to get up despite feeling quite fatigued; [name] to sit up, and light mod assist to stand to RW for orthostatic BPs and to get up to recliner; currently needs RW for more steadiness with walking and upright activity; Unable to stand for 3 minute BP reading due to dizziness:    01/30/22 1330 01/30/22 1400  Vital Signs  Patient Position (if appropriate) Orthostatic Vitals  --   Orthostatic Lying   BP- Lying 129/77  --   Pulse- Lying 79  --   Orthostatic Sitting  BP- Sitting 125/70 135/77 (immediately after standing)  Pulse- Sitting 76 80  Orthostatic Standing at 0 minutes  BP- Standing at 0 minutes 111/75  --   Pulse- Standing at 0 minutes 90  --   Orthostatic Standing at 3 minutes  BP- Standing at 3 minutes  (Unable to stand long enough for 3 min standing BP)  --      Recommendations for follow up therapy are one component of a multi-disciplinary discharge planning process, led by the attending physician.  Recommendations may be updated based on patient status, additional functional criteria and insurance authorization.  Follow Up Recommendations  Skilled nursing-short term rehab (<3 hours/day) (needs initial assist/supervision up to 24 hours on ST SNF)     Assistance Recommended at Discharge Frequent or constant Supervision/Assistance (to intermittent)  Patient can return home with the following A little help with walking and/or transfers;A little help with  bathing/dressing/bathroom;Assistance with cooking/housework;Assist for transportation   Equipment Recommendations  Rolling walker (2 wheels);BSC/3in1 (TBD)    Recommendations for Other Services       Precautions / Restrictions Precautions Precautions: Fall     Mobility  Bed Mobility Overal bed mobility: Modified Independent             General bed mobility comments: slow in/out, but more difficulty getting LE's in bed after fatigue from time up.    Transfers Overall transfer level: Needs assistance Equipment used: Rolling walker (2 wheels) Transfers: Sit to/from Stand, Bed to chair/wheelchair/BSC Sit to Stand: Mod assist   Step pivot transfers: Min assist       General transfer comment: light mod assist to power up; benefits form bil UE support from RW    Ambulation/Gait                   Stairs             Wheelchair Mobility    Modified Rankin (Stroke Patients Only)       Balance     Sitting balance-Leahy Scale: Fair       Standing balance-Leahy Scale: Poor Standing balance comment: reliant on external support and /or AD                            Cognition Arousal/Alertness: Awake/alert Behavior During Therapy: WFL for tasks assessed/performed Overall Cognitive Status: No family/caregiver present to determine baseline cognitive functioning  Exercises      General Comments General comments (skin integrity, edema, etc.): O2 sats mid to upper 90s on room air      Pertinent Vitals/Pain Pain Assessment Pain Assessment: No/denies pain ("just tired")    Home Living                          Prior Function            PT Goals (current goals can now be found in the care plan section) Acute Rehab PT Goals Patient Stated Goal: get back home and be able to handle HD by myself. PT Goal Formulation: With patient Time For Goal Achievement:  02/09/22 Potential to Achieve Goals: Good Progress towards PT goals: Progressing toward goals (slowly)    Frequency    Min 3X/week      PT Plan Current plan remains appropriate    Co-evaluation              AM-PAC PT "6 Clicks" Mobility   Outcome Measure  Help needed turning from your back to your side while in a flat bed without using bedrails?: None Help needed moving from lying on your back to sitting on the side of a flat bed without using bedrails?: A Little Help needed moving to and from a bed to a chair (including a wheelchair)?: A Little Help needed standing up from a chair using your arms (e.g., wheelchair or bedside chair)?: A Lot Help needed to walk in hospital room?: A Little Help needed climbing 3-5 steps with a railing? : A Lot 6 Click Score: 17    End of Session Equipment Utilized During Treatment: Gait belt;Oxygen Activity Tolerance: Patient tolerated treatment well;Patient limited by fatigue Patient left: in chair;with call bell/phone within reach;with chair alarm set Nurse Communication: Mobility status PT Visit Diagnosis: Unsteadiness on feet (R26.81);Muscle weakness (generalized) (M62.81)     Time: 2876-8115 PT Time Calculation (min) (ACUTE ONLY): 35 min  Charges:  $Therapeutic Activity: 23-37 mins                     Roney Marion, PT  Acute Rehabilitation Services Office [phone]    Colletta [place] 01/30/2022, 2:47 PM

## 2022-01-30 NOTE — Progress Notes (Signed)
Mobility Specialist Progress Note:   01/30/22 1552  Mobility  Activity Transferred from chair to bed  Level of Assistance Minimal assist, patient does 75% or more  Assistive Device  (HHA)  Distance Ambulated (ft) 4 ft  Activity Response Tolerated well  $Mobility charge 1 Mobility   Pt received in chair needing to get back to bed. MinA to stand and step to bed. Left in bed with call bell in reach and all needs met.   [facility] Mobility Specialist

## 2022-01-30 NOTE — Progress Notes (Signed)
Pt has been accepted at [facility] on TTS 10:55 chair time. Pt will need to arrive at 9:55 for first appt to complete paperwork prior to treatment. Met with pt at bedside to discuss above details. Pt agreeable. Navigator aware pt is for snf placement. Pt provided schedule letter and phone number to [place], to contact regarding assistance with possible future VA needs/resources. Update provided to attending, nephrologist, RN, and [facility] staff via secure chat. Will assist as needed.   Melven Sartorius Renal Navigator [phone]

## 2022-01-30 NOTE — Progress Notes (Signed)
Received call from CCMD that patient is in Atrial fib on the monitor. Gardner,MD notified via secure chat. New orders received.

## 2022-01-31 ENCOUNTER — Other Ambulatory Visit (HOSPITAL_COMMUNITY): Payer: Self-pay

## 2022-01-31 LAB — CBC
HCT: 23 % — ABNORMAL LOW (ref 39.0–52.0)
Hemoglobin: 7.5 g/dL — ABNORMAL LOW (ref 13.0–17.0)
MCH: 29.1 pg (ref 26.0–34.0)
MCHC: 32.6 g/dL (ref 30.0–36.0)
MCV: 89.1 fL (ref 80.0–100.0)
Platelets: 256 10*3/uL (ref 150–400)
RBC: 2.58 MIL/uL — ABNORMAL LOW (ref 4.22–5.81)
RDW: 15.3 % (ref 11.5–15.5)
WBC: 7.1 10*3/uL (ref 4.0–10.5)
nRBC: 0 % (ref 0.0–0.2)

## 2022-01-31 LAB — TSH: TSH: 1.134 u[IU]/mL (ref 0.350–4.500)

## 2022-01-31 MED ORDER — CALCITRIOL 0.25 MCG PO CAPS: 0.2500 ug | ORAL_CAPSULE | ORAL | Status: DC

## 2022-01-31 MED ORDER — APIXABAN 5 MG PO TABS: 5.0000 mg | ORAL_TABLET | Freq: Two times a day (BID) | ORAL | Status: DC

## 2022-01-31 MED ORDER — CARVEDILOL 25 MG PO TABS: 25.0000 mg | ORAL_TABLET | Freq: Two times a day (BID) | ORAL | Status: DC

## 2022-01-31 MED ORDER — CHLORHEXIDINE GLUCONATE 0.12 % MT SOLN
OROMUCOSAL | Status: AC
  Filled 2022-01-31: qty 15

## 2022-01-31 MED ORDER — CEFAZOLIN SODIUM-DEXTROSE 2-4 GM/100ML-% IV SOLN
INTRAVENOUS | Status: AC
  Filled 2022-01-31: qty 100

## 2022-01-31 MED ORDER — CALCIUM ACETATE (PHOS BINDER) 667 MG PO CAPS: 1334.0000 mg | ORAL_CAPSULE | Freq: Three times a day (TID) | ORAL | Status: DC

## 2022-01-31 MED ORDER — DILTIAZEM HCL ER COATED BEADS 180 MG PO CP24: 180.0000 mg | ORAL_CAPSULE | Freq: Every day | ORAL | Status: DC

## 2022-01-31 MED ORDER — DOCUSATE SODIUM 100 MG PO CAPS: 100.0000 mg | ORAL_CAPSULE | Freq: Two times a day (BID) | ORAL | 0 refills | Status: DC

## 2022-01-31 MED ORDER — TRANEXAMIC ACID-NACL 1000-0.7 MG/100ML-% IV SOLN
INTRAVENOUS | Status: AC
  Filled 2022-01-31: qty 100

## 2022-01-31 MED ORDER — POLYETHYLENE GLYCOL 3350 17 G PO PACK
17.0000 g | PACK | Freq: Every day | ORAL | 0 refills | Status: DC | PRN
Start: 2022-01-31 — End: 2023-02-19

## 2022-01-31 MED ORDER — APIXABAN 5 MG PO TABS
5.0000 mg | ORAL_TABLET | Freq: Two times a day (BID) | ORAL | Status: DC
  Administered 2022-01-31: 5 mg via ORAL
  Filled 2022-01-31: qty 1

## 2022-01-31 NOTE — Social Work (Signed)
Current SNF offers given to pt and pt's sister Trudee Kuster, they have accepted [facility]. Pt has new outpt HD arranged at [place] [facility] [place] [facility] [place] who confirms they are prepared to admit pt pending medical clearance and can accommodate transportation for pt's TTS HD schedule.   Renal Navigator confirmed with [place] they are able to start HD on Saturday. Anticipate dc today if pt does not require additional HD prior to dc.   Wandra Feinstein, MSW, LCSW [phone] (coverage)

## 2022-01-31 NOTE — Progress Notes (Signed)
Patient ID: Tanner Rollins, male   DOB: November 07, 1950, 71 y.o.   MRN: [id number]  Assessment/Plan:   ESRD - already advanced CKD stage IV secondary to poorly controlled HTN for the past year and now progressed to ESRD.  Strong family history of ESRD.  Renal US consistent with chronic medical renal disease.  No obstruction or hydro.  Tunneled HD catheter placed 01/24/22 and first HD session afterwards.  Appreciate single stage lt BBT by Dr. Scot Dock 6/12; tolerated HD on 6/12 w/ no cramping and nausea better afterwards.   - MWF regimen while in the hospital; only 278mL UF on Wed; appreciate renal navigator and he has been accepted at [facility] TTS 1055 chair time but needs to go early at 1015 this Saturday  to fill out paperwork.  Hypertensive emergency - markedly improved initially on  Cardene gtt.  Appreciate PCCM's assistance.  Now off of cardene gtt and on amlodipine 10 mg, carvedilol 25 mg bid, hydralazine 50 mg q6. Hyperkalemia - improved with lasix and lokelma. Acute hypoxic respiratory failure - wean supplemental oxygen.  CXR with bilateral lower lobe airspace disease worrisome for infection.  Small left pleural effusion, moderate right pleural effusion.  Antibiotics per PCCM.  Will UF with next HD session but may need thoracentesis.  ECHO with normal EF, Grade I DD, elevated LVEDP and pulmonary HTN. Anemia of chronic disease (ESRD) - iron 37, TSAT 19%, ferritin 654.  S/p blood transfusion.   Started IV iron and ESA and follow; aranesp 150 qwed. CKD-MBD - iPTH pending but with low calcium, started on calcitriol 0.25 MWF. Phoslo 2 tabs TIDM. Eating now. Vascular access- RIJ [facility] 01/24/22 and lt BBT (single stage) placement 01/28/22 by Dr. Scot Dock.  Disposition - he will need outpatient HD arrangements prior to discharge as he is likely ESRD.  S: Feels weak and tired, nausea improved with hd.   O:BP 123/81   Pulse 68   Temp 98.9 F (37.2 C) (Oral)   Resp 19   Ht 5' 11.5" (1.816 m)   Wt 74.3 kg   SpO2  93%   BMI 22.53 kg/m   Intake/Output Summary (Last 24 hours) at 01/31/2022 1114 Last data filed at 01/31/2022 0919 Gross per 24 hour  Intake 377.58 ml  Output 200 ml  Net 177.58 ml   Intake/Output: I/O last 3 completed shifts: In: 379.6 [P.O.:360; I.V.:19.6] Out: 200 [Other:200]  Intake/Output this shift:  Total I/O In: 118 [P.O.:118] Out: -  Weight change:  Gen:NAD CVS: RRR Resp: CTA Abd: +BS, soft, NT/ND Ext: no edema ACCESS: lt BBT w/ good bruit, RIJ TC  Recent Labs  Lab 01/25/22 0122 01/26/22 0258 01/28/22 0436  NA 135 132* 134*  K 4.0 4.9 4.1  CL 98 98 96*  CO2 19* 20* 23  GLUCOSE 97 88 95  BUN 83* 108* 86*  CREATININE 14.28* 17.93* 15.36*  CALCIUM 8.3* 8.2* 8.4*  PHOS 6.3* 8.9*  --    Liver Function Tests: No results for input(s): "AST", "ALT", "ALKPHOS", "BILITOT", "PROT", "ALBUMIN" in the last 168 hours.  No results for input(s): "LIPASE", "AMYLASE" in the last 168 hours.  No results for input(s): "AMMONIA" in the last 168 hours. CBC: Recent Labs  Lab 01/25/22 0122 01/26/22 0258 01/28/22 0436 01/31/22 0346  WBC 13.8* 11.1* 8.4 7.1  HGB 7.8* 7.7* 7.4* 7.5*  HCT 22.0* 22.6* 22.5* 23.0*  MCV 82.1 82.2 85.9 89.1  PLT 136* 137* 217 256   Cardiac Enzymes: No results for input(s): "CKTOTAL", "CKMB", "CKMBINDEX", "  TROPONINI" in the last 168 hours. CBG: No results for input(s): "GLUCAP" in the last 168 hours.   Iron Studies: No results for input(s): "IRON", "TIBC", "TRANSFERRIN", "FERRITIN" in the last 72 hours. Studies/Results: DG CHEST PORT 1 VIEW  Result Date: 01/30/2022 CLINICAL DATA:  Pleural effusion EXAM: PORTABLE CHEST 1 VIEW COMPARISON:  01/28/2022 FINDINGS: Gross cardiomegaly with right chest large bore multi lumen vascular catheter. Small to moderate bilateral pleural effusions, slightly increased compared to prior examination. Diffuse bilateral interstitial opacity. IMPRESSION: 1. Small to moderate bilateral pleural effusions, slightly  increased compared to prior examination. 2. Diffuse bilateral interstitial opacity, likely edema. 3. Cardiomegaly. Electronically Signed   By: Delanna Ahmadi M.D.   On: 01/30/2022 14:19     sodium chloride   Intravenous Once   apixaban  5 mg Oral BID   calcitRIOL  0.25 mcg Oral Q M,W,F   calcium acetate  1,334 mg Oral TID WC   carvedilol  25 mg Oral BID WC   Chlorhexidine Gluconate Cloth  6 each Topical Q0600   darbepoetin (ARANESP) injection - DIALYSIS  150 mcg Intravenous Q Wed-HD   diltiazem  180 mg Oral Daily   docusate sodium  100 mg Oral BID   mouth rinse  15 mL Mouth Rinse BID   polyethylene glycol  17 g Oral Daily   sodium chloride flush  10-40 mL Intracatheter Q12H    BMET    Component Value Date/Time   NA 134 (L) 01/28/2022 0436   K 4.1 01/28/2022 0436   CL 96 (L) 01/28/2022 0436   CO2 23 01/28/2022 0436   GLUCOSE 95 01/28/2022 0436   BUN 86 (H) 01/28/2022 0436   CREATININE 15.36 (H) 01/28/2022 0436   CALCIUM 8.4 (L) 01/28/2022 0436   GFRNONAA 3 (L) 01/28/2022 0436   CBC    Component Value Date/Time   WBC 7.1 01/31/2022 0346   RBC 2.58 (L) 01/31/2022 0346   HGB 7.5 (L) 01/31/2022 0346   HCT 23.0 (L) 01/31/2022 0346   PLT 256 01/31/2022 0346   MCV 89.1 01/31/2022 0346   MCH 29.1 01/31/2022 0346   MCHC 32.6 01/31/2022 0346   RDW 15.3 01/31/2022 0346   LYMPHSABS 1.1 01/23/2022 1515   MONOABS 0.8 01/23/2022 1515   EOSABS 0.0 01/23/2022 1515   BASOSABS 0.0 01/23/2022 1515

## 2022-01-31 NOTE — Progress Notes (Signed)
Advised by CSW that pt will d/c to snf today. Contacted [facility] and confirmed pt can start at clinic on Saturday. Pt will need to arrive Saturday at 10:30 to complete a few consents prior to 10:55 chair time. On Tuesday, pt will need to arrive at 10:15 to complete the remaining consents. Clinic aware of pt's d/c today and that pt will start on Saturday. Above info provided to CSW to provide to snf. Documented out-pt HD arrangements on AVS as well. Update provided to nephrologist who states pt will be ok to start at clinic on Saturday due to pt having HD on Monday and Wednesday of this week. Contacted renal PA regarding clinic's need for orders.   Melven Sartorius Renal Navigator [phone]

## 2022-01-31 NOTE — Progress Notes (Signed)
This RN attempted to call report to [facility] by the number given by SW twice with no response. Unable to leave voicemail.

## 2022-01-31 NOTE — Progress Notes (Signed)
Progress Note  Patient Name: Lamine Laton Date of Encounter: 01/31/2022  [facility] Cardiologist: Dr. Quay Burow  Subjective   Feels clinically improved.  Blood pressure under better control.  Getting hemodialysis.  Inpatient Medications    Scheduled Meds:  sodium chloride   Intravenous Once   aspirin EC  81 mg Oral Daily   calcitRIOL  0.25 mcg Oral Q M,W,F   calcium acetate  1,334 mg Oral TID WC   carvedilol  25 mg Oral BID WC   Chlorhexidine Gluconate Cloth  6 each Topical Q0600   darbepoetin (ARANESP) injection - DIALYSIS  150 mcg Intravenous Q Wed-HD   diltiazem  180 mg Oral Daily   docusate sodium  100 mg Oral BID   heparin injection (subcutaneous)  5,000 Units Subcutaneous Q8H   mouth rinse  15 mL Mouth Rinse BID   polyethylene glycol  17 g Oral Daily   sodium chloride flush  10-40 mL Intracatheter Q12H   Continuous Infusions:  anticoagulant sodium citrate     PRN Meds: acetaminophen, anticoagulant sodium citrate, docusate sodium, fentaNYL (SUBLIMAZE) injection, ondansetron (ZOFRAN) IV, oxyCODONE-acetaminophen, polyethylene glycol, sodium chloride flush   Vital Signs    Vitals:   01/31/22 0045 01/31/22 0434 01/31/22 0721 01/31/22 0903  BP: 116/67 125/75 117/79 123/81  Pulse: 68 73 77 68  Resp: 19 18 19    Temp: 98.7 F (37.1 C) 98.4 F (36.9 C) 98.9 F (37.2 C)   TempSrc: Oral Oral Oral   SpO2: 96% 91% 93%   Weight:  74.3 kg    Height:        Intake/Output Summary (Last 24 hours) at 01/31/2022 0939 Last data filed at 01/31/2022 0919 Gross per 24 hour  Intake 377.58 ml  Output 200 ml  Net 177.58 ml      01/31/2022    4:34 AM 01/30/2022    3:48 PM 01/28/2022    4:03 PM  Last 3 Weights  Weight (lbs) 163 lb 12.8 oz 163 lb 2.3 oz 166 lb 3.6 oz  Weight (kg) 74.3 kg 74 kg 75.4 kg      Telemetry    Sinus rhythm with short run of PAF- Personally Reviewed  ECG    Not performed today- Personally Reviewed  Physical Exam   GEN: No acute  distress.   Neck: No JVD Cardiac: RRR, no murmurs, rubs, or gallops.  Respiratory: Clear to auscultation bilaterally. GI: Soft, nontender, non-distended  MS: No edema; No deformity. Neuro:  Nonfocal  Psych: Normal affect   Labs    High Sensitivity Troponin:   Recent Labs  Lab 01/23/22 1515 01/23/22 1650  TROPONINIHS 323* 304*     Chemistry Recent Labs  Lab 01/25/22 0122 01/26/22 0258 01/28/22 0436  NA 135 132* 134*  K 4.0 4.9 4.1  CL 98 98 96*  CO2 19* 20* 23  GLUCOSE 97 88 95  BUN 83* 108* 86*  CREATININE 14.28* 17.93* 15.36*  CALCIUM 8.3* 8.2* 8.4*  MG 1.7 2.0  --   GFRNONAA 3* 3* 3*  ANIONGAP 18* 14 15    Lipids No results for input(s): "CHOL", "TRIG", "HDL", "LABVLDL", "LDLCALC", "CHOLHDL" in the last 168 hours.  Hematology Recent Labs  Lab 01/25/22 0122 01/26/22 0258 01/28/22 0436  WBC 13.8* 11.1* 8.4  RBC 2.68* 2.75* 2.62*  HGB 7.8* 7.7* 7.4*  HCT 22.0* 22.6* 22.5*  MCV 82.1 82.2 85.9  MCH 29.1 28.0 28.2  MCHC 35.5 34.1 32.9  RDW 16.6* 16.4* 16.0*  PLT 136*  137* 217   Thyroid  Recent Labs  Lab 01/31/22 0346  TSH 1.134    BNPNo results for input(s): "BNP", "PROBNP" in the last 168 hours.  DDimer No results for input(s): "DDIMER" in the last 168 hours.   Radiology    DG CHEST PORT 1 VIEW  Result Date: 01/30/2022 CLINICAL DATA:  Pleural effusion EXAM: PORTABLE CHEST 1 VIEW COMPARISON:  01/28/2022 FINDINGS: Gross cardiomegaly with right chest large bore multi lumen vascular catheter. Small to moderate bilateral pleural effusions, slightly increased compared to prior examination. Diffuse bilateral interstitial opacity. IMPRESSION: 1. Small to moderate bilateral pleural effusions, slightly increased compared to prior examination. 2. Diffuse bilateral interstitial opacity, likely edema. 3. Cardiomegaly. Electronically Signed   By: Delanna Ahmadi M.D.   On: 01/30/2022 14:19    Cardiac Studies   2D echocardiogram (01/24/2022)  IMPRESSIONS     1.  Left ventricular ejection fraction, by estimation, is 55 to 60%. The  left ventricle has normal function. The left ventricle has no regional  wall motion abnormalities. There is moderate concentric left ventricular  hypertrophy. Left ventricular  diastolic parameters are consistent with Grade I diastolic dysfunction  (impaired relaxation). Elevated left ventricular end-diastolic pressure.   2. Right ventricular systolic function is normal. The right ventricular  size is normal. There is moderately elevated pulmonary artery systolic  pressure.   3. Left atrial size was severely dilated.   4. Right atrial size was mildly dilated.   5. The mitral valve is normal in structure. Trivial mitral valve  regurgitation. No evidence of mitral stenosis.   6. The aortic valve is tricuspid. Aortic valve regurgitation is not  visualized. No aortic stenosis is present.   7. The inferior vena cava is dilated in size with <50% respiratory  variability, suggesting right atrial pressure of 15 mmHg.   Patient Profile     Philipe Laswell is a 71 y.o. male with a hx of hypertension and chronic kidney disease who is being seen 01/30/2022 for the evaluation of atrial fibrillation with rapid ventricular rate at the request of Dr. Tawanna Solo.  Assessment & Plan    1: Paroxysmal atrial fibrillation-we were initially called to see patient with A-fib with RVR which converted with IV diltiazem.  He had a small run of PAF on telemetry today.The CHA2DSVASC2 score is  2 .  If he is compliant, he would benefit from being on a DOAC.  2: Hypertensive urgency-blood pressure under much better control on current medications including beta-blocker and calcium channel blocker (amlodipine was changed to Cardizem).  3: End-stage renal disease-on hemodialysis [facility] will sign off.   Medication Recommendations: Begin a DOAC such as Eliquis. Other recommendations (labs, testing, etc): None Follow up as an outpatient: Follow-up  with [facility] heart care as outpatient.  For questions or updates, please contact [place] Please consult [email or web address] for contact info under        Signed, Quay Burow, MD  01/31/2022, 9:39 AM

## 2022-01-31 NOTE — Discharge Summary (Signed)
Physician Discharge Summary  Chrishaun Sasso OEV:[id number] DOB: 12/10/1950 DOA: 01/23/2022  PCP: Pcp, No  Admit date: 01/23/2022 Discharge date: 01/31/2022  Admitted From: Home Disposition:  Home  Discharge Condition:Stable CODE STATUS:FULL Diet recommendation: Renal   Brief/Interim Summary: Patient is a 71 year old male with past medical history of hypertension, CKD stage IV who presented with chest pain, hypertension.  Patient initially needed nitroglycerin /Cardene drip and was admitted to ICU for further management of severe hypertension.  Lab work showed severe AKI on CKD.  Nephrology consulted and started on hemodialysis.  He was transferred out of ICU.  PT/OT recommended skilled nursing facility on discharge, he was waiting for outpatient dialysis arrangement.  Hospital course remarkable for new onset A-fib with RVR in the early morning of 6/14.  Cardiology consulted, started on Cardizem, Eliquis.  Currently heart rate is controlled.  Outpatient dialysis center has been arranged, he is medically stable for discharge today to skilled nursing facility.  Following problems were addressed during his hospitalization:  A-fib with RVR: New onset.  Cardiology consulted.  Converted to normal sinus rhythm after restarting on Cardizem drip.  Currently on oral Cardizem.  He will follow-up with cardiology as an outpatient.  Started on Eliquis for anticoagulation  hypertensive emergency: Presented with severe hypertension and was started on Cardene drip and was transferred to ICU.  Continue current medications  AKI on CKD stage IV: Presented with worsening renal function.  Started on dialysis here.  Has AV  fistula on the left arm.  Waiting for outpatient dialysis chair.  Renal navigator following.  Progression of CKD is most likely secondary to poorly controlled diabetes.  Renal ultrasound consistent with chronic medical renal disease. Hyperkalemia improved with Lasix, Lokelma   Acute hypoxic  respiratory failure: Initially chest x-ray showed bilateral lower lobe opacity worrisome for infection.  Suspect aspiration pneumonia.  Imagings also showed a small left pleural effusion, moderate right pleural effusion.  Respiratory status improved with dialysis.  Completed antibiotics course.  Currently on 1- 2 L of oxygen per minute    Anemia of chronic disease: Patient was given blood transfusion during this hospitalization.  Started on IV iron, ESA.  On aranesp.  Hemoglobin stable in the range of 7.   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  Very deconditioned.  He was previously living with his sister, was able to walk without any problems.      Discharge Diagnoses:  Principal Problem:   Hypertensive emergency Active Problems:   Chest pain   AKI (acute kidney injury) ([place])   High anion gap metabolic acidosis    Discharge Instructions  Discharge Instructions     Diet - low sodium heart healthy   Complete by: As directed    Discharge instructions   Complete by: As directed    1)Please take prescribed medications as instructed 2)You will be called by cardiology for follow-up appointment 3)Undergo dialysis on scheduled days   Increase activity slowly   Complete by: As directed    No wound care   Complete by: As directed       Allergies as of 01/31/2022   No Known Allergies      Medication List     STOP taking these medications    aspirin EC 81 MG tablet   chlorthalidone 25 MG tablet Commonly known as: HYGROTON       TAKE these medications    apixaban 5 MG Tabs tablet Commonly known as:  ELIQUIS Take 1 tablet (5 mg total) by mouth 2 (two) times daily.   atorvastatin 40 MG tablet Commonly known as: LIPITOR Take 1 tablet (40 mg total) by mouth daily.   calcitRIOL 0.25 MCG capsule Commonly known as: ROCALTROL Take 1 capsule (0.25 mcg total) by mouth every Monday,  Wednesday, and Friday. Start taking on: February 01, 2022   calcium acetate 667 MG capsule Commonly known as: PHOSLO Take 2 capsules (1,334 mg total) by mouth 3 (three) times daily with meals.   carvedilol 25 MG tablet Commonly known as: COREG Take 1 tablet (25 mg total) by mouth 2 (two) times daily with a meal.   diltiazem 180 MG 24 hr capsule Commonly known as: CARDIZEM CD Take 1 capsule (180 mg total) by mouth daily. Start taking on: February 01, 2022   docusate sodium 100 MG capsule Commonly known as: COLACE Take 1 capsule (100 mg total) by mouth 2 (two) times daily.   polyethylene glycol 17 g packet Commonly known as: MIRALAX / GLYCOLAX Take 17 g by mouth daily as needed for moderate constipation.        Contact information for follow-up providers     [facility] Follow up in 6 week(s).   Specialty: Vascular Surgery Why: Office will call you to arrange your appt (sent) Contact information: [address] [place], [facility] Go on 02/02/2022.   Why: Schedule is Tuesday/Thursday/Saturday with 10:55 chair time.  Please arrive at 10:30 on Saturday. Please arrive at 10:15 on Tuesday.  After Tuesday, please arrive at 10:35. Contact information: [place] [address] [phone]              Contact information for after-discharge care     Destination     [facility] .   Service: Skilled Nursing Contact information: [address] [place] [phone]                    No Known Allergies  Consultations: Cardiology   Procedures/Studies: DG CHEST PORT 1 VIEW  Result Date: 01/30/2022 CLINICAL DATA:  Pleural effusion EXAM: PORTABLE CHEST 1 VIEW COMPARISON:  01/28/2022 FINDINGS: Gross cardiomegaly with right chest large bore multi lumen vascular catheter. Small to  moderate bilateral pleural effusions, slightly increased compared to prior examination. Diffuse bilateral interstitial opacity. IMPRESSION: 1. Small to moderate bilateral pleural effusions, slightly increased compared to prior examination. 2. Diffuse bilateral interstitial opacity, likely edema. 3. Cardiomegaly. Electronically Signed   By: Delanna Ahmadi M.D.   On: 01/30/2022 14:19   DG CHEST PORT 1 VIEW  Result Date: 01/28/2022 CLINICAL DATA:  Shortness of breath. EXAM: PORTABLE CHEST 1 VIEW COMPARISON:  01/25/2022. FINDINGS: 4:39 a.m., 01/29/2019. Right IJ dialysis catheter tip remains in the upper right atrium. The heart is enlarged. Mild central vascular prominence with slight basilar interstitial edema continue to be noted with moderate pleural effusions. There are opacities in the lung bases which again could represent atelectasis, or patchy consolidation such as due to pneumonia, aspiration or combination. The mid and upper lung fields are clear with COPD change. There is aortic atherosclerosis and tortuosity. Either the pleural effusions are mildly improved or redistributed. In all other respects there are no further changes. IMPRESSION: There is mild improvement in aeration of the bases but there are still patchy consolidative or atelectatic opacities overlying moderate pleural effusions. The  pleural effusions may have mildly improved versus redistributed. Perihilar vascular congestion and slight basal interstitial edema appear similar. Electronically Signed   By: Telford Nab M.D.   On: 01/28/2022 06:36   VAS [place] UPPER EXT VEIN MAPPING (PRE-OP AVF)  Result Date: 01/26/2022 UPPER EXTREMITY VEIN MAPPING Patient Name:  ZAFIR SCHAUER  Date of Exam:   01/25/2022 Medical Rec #: [id number]        Accession #:    [id number] Date of Birth: 06-09-1951        Patient Gender: M Patient Age:   2 years Exam Location:  [facility] Procedure:      VAS [place] UPPER EXT VEIN MAPPING (PRE-OP AVF) Referring Phys:  EMMA COLLINS --------------------------------------------------------------------------------  Indications: Pre-access. History: HTN, SMK, CKD4 (now in need of HD).  Limitations: Multiple IVs/tape Comparison Study: No previous exams Performing Technologist: Jody Hill RVT, RDMS  Examination Guidelines: A complete evaluation includes B-mode imaging, spectral Doppler, color Doppler, and power Doppler as needed of all accessible portions of each vessel. Bilateral testing is considered an integral part of a complete examination. Limited examinations for reoccurring indications may be performed as noted. +-----------------+-------------+----------+--------------+ Right Cephalic   Diameter (cm)Depth (cm)   Findings    +-----------------+-------------+----------+--------------+ Shoulder             0.28        0.42                  +-----------------+-------------+----------+--------------+ Prox upper arm       0.31        0.44                  +-----------------+-------------+----------+--------------+ Mid upper arm        0.28        0.23                  +-----------------+-------------+----------+--------------+ Dist upper arm       0.27        0.35                  +-----------------+-------------+----------+--------------+ Antecubital fossa    0.40        0.15      thrombus    +-----------------+-------------+----------+--------------+ Prox forearm         0.35        0.26                  +-----------------+-------------+----------+--------------+ Mid forearm          0.31        0.32                  +-----------------+-------------+----------+--------------+ Dist forearm                            not visualized +-----------------+-------------+----------+--------------+ Wrist                0.18        0.22                  +-----------------+-------------+----------+--------------+ +-----------------+-------------+----------+---------+ Right Basilic    Diameter  (cm)Depth (cm)Findings  +-----------------+-------------+----------+---------+ Prox upper arm       0.38                         +-----------------+-------------+----------+---------+ Mid upper arm        0.26                         +-----------------+-------------+----------+---------+  Dist upper arm       0.24                         +-----------------+-------------+----------+---------+ Antecubital fossa    0.27               branching +-----------------+-------------+----------+---------+ Prox forearm         0.16                         +-----------------+-------------+----------+---------+ Mid forearm          0.17                         +-----------------+-------------+----------+---------+ Distal forearm       0.15               branching +-----------------+-------------+----------+---------+ Wrist                0.10                         +-----------------+-------------+----------+---------+ Thrumbus seen in cephalic/AC - site of lab draw +-----------------+-------------+----------+--------------+ Left Cephalic    Diameter (cm)Depth (cm)   Findings    +-----------------+-------------+----------+--------------+ Shoulder             0.21        0.50                  +-----------------+-------------+----------+--------------+ Prox upper arm       0.17        0.32                  +-----------------+-------------+----------+--------------+ Mid upper arm        0.15        0.32                  +-----------------+-------------+----------+--------------+ Dist upper arm       0.22        0.42                  +-----------------+-------------+----------+--------------+ Antecubital fossa    0.27        0.22   IV / thrombus  +-----------------+-------------+----------+--------------+ Prox forearm                            not visualized +-----------------+-------------+----------+--------------+ Mid forearm          0.21         0.43     branching    +-----------------+-------------+----------+--------------+ Dist forearm         0.16        0.31                  +-----------------+-------------+----------+--------------+ Wrist                0.10        0.33                  +-----------------+-------------+----------+--------------+ +-----------------+-------------+----------+---------+ Left Basilic     Diameter (cm)Depth (cm)Findings  +-----------------+-------------+----------+---------+ Prox upper arm       0.39                         +-----------------+-------------+----------+---------+ Mid upper arm        0.34                         +-----------------+-------------+----------+---------+  Dist upper arm       0.29                         +-----------------+-------------+----------+---------+ Antecubital fossa    0.19               branching +-----------------+-------------+----------+---------+ Prox forearm         0.16                         +-----------------+-------------+----------+---------+ Mid forearm          0.18                         +-----------------+-------------+----------+---------+ Distal forearm       0.15                         +-----------------+-------------+----------+---------+ Wrist                0.10                         +-----------------+-------------+----------+---------+ *See table(s) above for measurements and observations.  Diagnosing physician: Servando Snare MD Electronically signed by Servando Snare MD on 01/26/2022 at 9:56:07 AM.    Final    DG CHEST PORT 1 VIEW  Result Date: 01/25/2022 CLINICAL DATA:  Hypoxemia. EXAM: PORTABLE CHEST 1 VIEW COMPARISON:  Radiograph January 23, 2022 FINDINGS: Interval placement of a dual-lumen right approach central venous catheter with tip overlying the right atrium. No visible pneumothorax. The heart size and mediastinal contours are partially obscured but appear unchanged. Increase in now moderate right  pleural effusion and stable small left pleural effusion. Opacities in the bilateral lung bases along the pleural effusions are similar prior may reflect atelectasis or consolidation. No acute osseous abnormality. IMPRESSION: Interval placement of a dual lumen right-sided central venous catheter without visible pneumothorax. Increase in a now moderate right pleural effusion with a stable small left pleural effusion and unchanged adjacent bilateral airspace opacities reflecting infiltrate or atelectasis. Electronically Signed   By: Dahlia Bailiff M.D.   On: 01/25/2022 08:20   IR Fluoro Guide CV Line Right  Result Date: 01/24/2022 CLINICAL DATA:  Renal failure, needs durable venous access for planned hemodialysis EXAM: TUNNELED HEMODIALYSIS CATHETER PLACEMENT WITH ULTRASOUND AND FLUOROSCOPIC GUIDANCE TECHNIQUE: The procedure, risks, benefits, and alternatives were explained to the patient. Questions regarding the procedure were encouraged and answered. The patient understands and consents to the procedure. As antibiotic prophylaxis, cefazolin 2 g was ordered pre-procedure and administered intravenously within one hour of incision. Patency of the right IJ vein was confirmed with ultrasound with image documentation. An appropriate skin site was determined. Region was prepped using maximum barrier technique including cap and mask, sterile gown, sterile gloves, large sterile sheet, and Chlorhexidine as cutaneous antisepsis. The region was infiltrated locally with 1% lidocaine. Under real-time ultrasound guidance, the right IJ vein was accessed with a 21 gauge micropuncture needle; the needle tip within the vein was confirmed with ultrasound image documentation. Needle exchanged over the 018 guidewire for transitional dilator, which allowed advancement of a Benson wire into the IVC. Over this, an MPA catheter was advanced. A Palindrome 23 hemodialysis catheter was tunneled from the right anterior chest wall approach to  the right IJ dermatotomy site. The MPA catheter was exchanged over an Amplatz wire for serial vascular dilators which  allow placement of a peel-away sheath, through which the catheter was advanced under intermittent fluoroscopy, positioned with its tips in the proximal and midright atrium. Spot chest radiograph confirms good catheter position. No pneumothorax. Catheter was flushed and primed per protocol. Catheter secured externally with O Prolene sutures. The right IJ dermatotomy site was closed with Dermabond. COMPLICATIONS: COMPLICATIONS None immediate COMPARISON:  None Available. IMPRESSION: 1. Technically successful placement of tunneled right IJ hemodialysis catheter with ultrasound and fluoroscopic guidance. Ready for routine use. ACCESS: Remains approachable for percutaneous intervention as needed. Electronically Signed   By: Lucrezia Europe M.D.   On: 01/24/2022 15:55   IR US Guide Vasc Access Right  Result Date: 01/24/2022 CLINICAL DATA:  Renal failure, needs durable venous access for planned hemodialysis EXAM: TUNNELED HEMODIALYSIS CATHETER PLACEMENT WITH ULTRASOUND AND FLUOROSCOPIC GUIDANCE TECHNIQUE: The procedure, risks, benefits, and alternatives were explained to the patient. Questions regarding the procedure were encouraged and answered. The patient understands and consents to the procedure. As antibiotic prophylaxis, cefazolin 2 g was ordered pre-procedure and administered intravenously within one hour of incision. Patency of the right IJ vein was confirmed with ultrasound with image documentation. An appropriate skin site was determined. Region was prepped using maximum barrier technique including cap and mask, sterile gown, sterile gloves, large sterile sheet, and Chlorhexidine as cutaneous antisepsis. The region was infiltrated locally with 1% lidocaine. Under real-time ultrasound guidance, the right IJ vein was accessed with a 21 gauge micropuncture needle; the needle tip within the vein was  confirmed with ultrasound image documentation. Needle exchanged over the 018 guidewire for transitional dilator, which allowed advancement of a Benson wire into the IVC. Over this, an MPA catheter was advanced. A Palindrome 23 hemodialysis catheter was tunneled from the right anterior chest wall approach to the right IJ dermatotomy site. The MPA catheter was exchanged over an Amplatz wire for serial vascular dilators which allow placement of a peel-away sheath, through which the catheter was advanced under intermittent fluoroscopy, positioned with its tips in the proximal and midright atrium. Spot chest radiograph confirms good catheter position. No pneumothorax. Catheter was flushed and primed per protocol. Catheter secured externally with O Prolene sutures. The right IJ dermatotomy site was closed with Dermabond. COMPLICATIONS: COMPLICATIONS None immediate COMPARISON:  None Available. IMPRESSION: 1. Technically successful placement of tunneled right IJ hemodialysis catheter with ultrasound and fluoroscopic guidance. Ready for routine use. ACCESS: Remains approachable for percutaneous intervention as needed. Electronically Signed   By: Lucrezia Europe M.D.   On: 01/24/2022 15:55   ECHOCARDIOGRAM COMPLETE  Result Date: 01/24/2022    ECHOCARDIOGRAM REPORT   Patient Name:   KRUE PETERKA Date of Exam: 01/24/2022 Medical Rec #:  [id number]       Height:       71.5 in Accession #:    [id number]      Weight:       159.6 lb Date of Birth:  03-May-1951       BSA:          1.926 m Patient Age:    35 years        BP:           128/85 mmHg Patient Gender: M               HR:           68 bpm. Exam Location:  Inpatient Procedure: 2D Echo, Cardiac Doppler, Color Doppler, Strain Analysis and 3D Echo  STAT ECHO Reported to: Dr. Oval Linsey on 01/24/2022 8:35:00 AM. Indications:    Chest pain  History:        Patient has no prior history of Echocardiogram examinations.                 Risk Factors:Hypertension.   Sonographer:    Luisa Hart RDCS Referring Phys: [id number] [place]  1. Left ventricular ejection fraction, by estimation, is 55 to 60%. The left ventricle has normal function. The left ventricle has no regional wall motion abnormalities. There is moderate concentric left ventricular hypertrophy. Left ventricular diastolic parameters are consistent with Grade I diastolic dysfunction (impaired relaxation). Elevated left ventricular end-diastolic pressure.  2. Right ventricular systolic function is normal. The right ventricular size is normal. There is moderately elevated pulmonary artery systolic pressure.  3. Left atrial size was severely dilated.  4. Right atrial size was mildly dilated.  5. The mitral valve is normal in structure. Trivial mitral valve regurgitation. No evidence of mitral stenosis.  6. The aortic valve is tricuspid. Aortic valve regurgitation is not visualized. No aortic stenosis is present.  7. The inferior vena cava is dilated in size with <50% respiratory variability, suggesting right atrial pressure of 15 mmHg. FINDINGS  Left Ventricle: Left ventricular ejection fraction, by estimation, is 55 to 60%. The left ventricle has normal function. The left ventricle has no regional wall motion abnormalities. Global longitudinal strain performed but not reported based on interpreter judgement due to suboptimal tracking. The left ventricular internal cavity size was normal in size. There is moderate concentric left ventricular hypertrophy. Left ventricular diastolic parameters are consistent with Grade I diastolic dysfunction (impaired relaxation). Elevated left ventricular end-diastolic pressure. Right Ventricle: The right ventricular size is normal. No increase in right ventricular wall thickness. Right ventricular systolic function is normal. There is moderately elevated pulmonary artery systolic pressure. The tricuspid regurgitant velocity is 2.90 m/s, and with an assumed right  atrial pressure of 15 mmHg, the estimated right ventricular systolic pressure is 10.2 mmHg. Left Atrium: Left atrial size was severely dilated. Right Atrium: Right atrial size was mildly dilated. Pericardium: There is no evidence of pericardial effusion. Mitral Valve: The mitral valve is normal in structure. Trivial mitral valve regurgitation. No evidence of mitral valve stenosis. MV peak gradient, 4.0 mmHg. The mean mitral valve gradient is 1.0 mmHg. Tricuspid Valve: The tricuspid valve is normal in structure. Tricuspid valve regurgitation is trivial. No evidence of tricuspid stenosis. Aortic Valve: The aortic valve is tricuspid. Aortic valve regurgitation is not visualized. No aortic stenosis is present. Aortic valve mean gradient measures 6.0 mmHg. Aortic valve peak gradient measures 11.8 mmHg. Aortic valve area, by VTI measures 3.65  cm. Pulmonic Valve: The pulmonic valve was normal in structure. Pulmonic valve regurgitation is not visualized. No evidence of pulmonic stenosis. Aorta: The aortic root is normal in size and structure. Venous: The inferior vena cava is dilated in size with less than 50% respiratory variability, suggesting right atrial pressure of 15 mmHg. IAS/Shunts: No atrial level shunt detected by color flow Doppler.  LEFT VENTRICLE PLAX 2D LVIDd:         4.10 cm     Diastology LVIDs:         3.10 cm     LV e' medial:    4.79 cm/s LV PW:         1.50 cm     LV E/e' medial:  14.8 LV IVS:  1.50 cm     LV e' lateral:   7.04 cm/s LVOT diam:     2.20 cm     LV E/e' lateral: 10.1 LV SV:         120 LV SV Index:   62          2D Longitudinal Strain LVOT Area:     3.80 cm    2D Strain GLS (A2C):   -10.4 %                            2D Strain GLS (A3C):   -17.3 %                            2D Strain GLS (A4C):   -14.4 % LV Volumes (MOD)           2D Strain GLS Avg:     -14.0 % LV vol d, MOD A2C: 92.9 ml LV vol d, MOD A4C: 88.4 ml LV vol s, MOD A2C: 25.7 ml LV vol s, MOD A4C: 46.7 ml LV SV MOD A2C:      67.2 ml LV SV MOD A4C:     88.4 ml LV SV MOD BP:      54.4 ml RIGHT VENTRICLE RV Basal diam:  4.10 cm RV Mid diam:    1.70 cm RV S prime:     20.60 cm/s TAPSE (M-mode): 2.7 cm LEFT ATRIUM              Index        RIGHT ATRIUM           Index LA Vol (A2C):   104.0 ml 54.00 ml/m  RA Area:     19.30 cm LA Vol (A4C):   118.0 ml 61.27 ml/m  RA Volume:   56.90 ml  29.55 ml/m LA Biplane Vol: 113.0 ml 58.68 ml/m  AORTIC VALVE                     PULMONIC VALVE AV Area (Vmax):    3.67 cm      PV Vmax:       1.11 m/s AV Area (Vmean):   3.61 cm      PV Vmean:      80.100 cm/s AV Area (VTI):     3.65 cm      PV VTI:        0.235 m AV Vmax:           172.00 cm/s   PV Peak grad:  4.9 mmHg AV Vmean:          113.675 cm/s  PV Mean grad:  3.0 mmHg AV VTI:            0.328 m AV Peak Grad:      11.8 mmHg AV Mean Grad:      6.0 mmHg LVOT Vmax:         166.00 cm/s LVOT Vmean:        108.000 cm/s LVOT VTI:          0.314 m LVOT/AV VTI ratio: 0.96  AORTA Ao Root diam: 3.40 cm Ao Asc diam:  3.50 cm MITRAL VALVE               TRICUSPID VALVE MV Area (PHT): 2.56 cm    TR Peak grad:   33.6 mmHg MV Area VTI:  3.90 cm    TR Vmax:        290.00 cm/s MV Peak grad:  4.0 mmHg MV Mean grad:  1.0 mmHg    SHUNTS MV Vmax:       1.00 m/s    Systemic VTI:  0.31 m MV Vmean:      54.0 cm/s   Systemic Diam: 2.20 cm MV Decel Time: 296 msec MV E velocity: 71.00 cm/s MV A velocity: 97.30 cm/s MV E/A ratio:  0.73 Skeet Latch MD Electronically signed by Skeet Latch MD Signature Date/Time: 01/24/2022/11:14:58 AM    Final    US RENAL  Result Date: 01/23/2022 CLINICAL DATA:  Acute on chronic renal failure. EXAM: RENAL / URINARY TRACT ULTRASOUND COMPLETE COMPARISON:  CT abdomen and pelvis 02/09/2021. Renal ultrasound 03/12/2021. FINDINGS: Right Kidney: Renal measurements: 9.1 x 4.1 x 3.8 cm = volume: 73 mL. Echogenicity is increased. No hydronephrosis. There is a 3.7 x 2.8 x 3.8 cm cyst in the superior pole the right kidney which has  mildly increased in size. Left Kidney: Renal measurements: 7.8 x 5.6 x 4.0 cm = volume: 92 mL. Echogenicity is increased. No mass or hydronephrosis visualized. Bladder: Appears normal for degree of bladder distention. Other: Right pleural effusion present. IMPRESSION: 1. Echogenic kidneys likely related to medical renal disease, similar to prior study. 2. No hydronephrosis. 3. Right renal cyst has mildly increased in size. 4. Right pleural effusion. Electronically Signed   By: Ronney Asters M.D.   On: 01/23/2022 23:37   DG Chest Portable 1 View  Result Date: 01/23/2022 CLINICAL DATA:  Chest pain. EXAM: PORTABLE CHEST 1 VIEW COMPARISON:  Chest x-ray 11/17/2011. CT abdomen and pelvis 03/11/2021. FINDINGS: There is patchy airspace disease in the bilateral lower lobes. There is likely a small left pleural effusion. Cardiac silhouette is enlarged, a new finding. No evidence for pneumothorax. No acute fractures. IMPRESSION: 1. New enlarged cardiac silhouette may represent cardiomegaly and or pericardial effusion. 2. Bilateral lower lobe airspace disease worrisome for infection. 3. Small left pleural effusion. Electronically Signed   By: Ronney Asters M.D.   On: 01/23/2022 17:11      Subjective: Patient seen and examined at the bedside this morning.  Hemodynamically stable for discharge today.I called his sister for update, call not received. I had communicated with her on 6/14  Discharge Exam: Vitals:   01/31/22 0721 01/31/22 0903  BP: 117/79 123/81  Pulse: 77 68  Resp: 19   Temp: 98.9 F (37.2 C)   SpO2: 93%    Vitals:   01/31/22 0045 01/31/22 0434 01/31/22 0721 01/31/22 0903  BP: 116/67 125/75 117/79 123/81  Pulse: 68 73 77 68  Resp: 19 18 19    Temp: 98.7 F (37.1 C) 98.4 F (36.9 C) 98.9 F (37.2 C)   TempSrc: Oral Oral Oral   SpO2: 96% 91% 93%   Weight:  74.3 kg    Height:        General: Pt is alert, awake, not in acute distress Cardiovascular: RRR, S1/S2 +, no rubs, no gallops,  dialysis catheter on the right chest Respiratory: CTA bilaterally, no wheezing, no rhonchi Abdominal: Soft, NT, ND, bowel sounds + Extremities: no edema, no cyanosis    The results of significant diagnostics from this hospitalization (including imaging, microbiology, ancillary and laboratory) are listed below for reference.     Microbiology: Recent Results (from the past 240 hour(s))  MRSA Next Gen by PCR, Nasal     Status: None   Collection  Time: 01/25/22 11:01 AM   Specimen: Nasal Mucosa; Nasal Swab  Result Value Ref Range Status   MRSA by PCR Next Gen NOT DETECTED NOT DETECTED Final    Comment: (NOTE) The GeneXpert MRSA Assay (FDA approved for NASAL specimens only), is one component of a comprehensive MRSA colonization surveillance program. It is not intended to diagnose MRSA infection nor to guide or monitor treatment for MRSA infections. Test performance is not FDA approved in patients less than 16 years old. Performed at [facility], [place] [address] [place], [place] [address]   Surgical PCR screen     Status: None   Collection Time: 01/27/22  8:56 PM   Specimen: Nasal Mucosa; Nasal Swab  Result Value Ref Range Status   MRSA, PCR NEGATIVE NEGATIVE Final   Staphylococcus aureus NEGATIVE NEGATIVE Final    Comment: (NOTE) The Xpert SA Assay (FDA approved for NASAL specimens in patients 43 years of age and older), is one component of a comprehensive surveillance program. It is not intended to diagnose infection nor to guide or monitor treatment. Performed at [facility], [place] [address], [place], [place] [address]      Labs: BNP (last 3 results) Recent Labs    01/23/22 1515  BNP 1,287.8*   Basic Metabolic Panel: Recent Labs  Lab 01/25/22 0122 01/26/22 0258 01/28/22 0436  NA 135 132* 134*  K 4.0 4.9 4.1  CL 98 98 96*  CO2 19* 20* 23  GLUCOSE 97 88 95  BUN 83* 108* 86*  CREATININE 14.28* 17.93* 15.36*  CALCIUM 8.3* 8.2* 8.4*  MG 1.7 2.0  --    PHOS 6.3* 8.9*  --    Liver Function Tests: No results for input(s): "AST", "ALT", "ALKPHOS", "BILITOT", "PROT", "ALBUMIN" in the last 168 hours. No results for input(s): "LIPASE", "AMYLASE" in the last 168 hours. No results for input(s): "AMMONIA" in the last 168 hours. CBC: Recent Labs  Lab 01/25/22 0122 01/26/22 0258 01/28/22 0436  WBC 13.8* 11.1* 8.4  HGB 7.8* 7.7* 7.4*  HCT 22.0* 22.6* 22.5*  MCV 82.1 82.2 85.9  PLT 136* 137* 217   Cardiac Enzymes: No results for input(s): "CKTOTAL", "CKMB", "CKMBINDEX", "TROPONINI" in the last 168 hours. BNP: Invalid input(s): "POCBNP" CBG: No results for input(s): "GLUCAP" in the last 168 hours. D-Dimer No results for input(s): "DDIMER" in the last 72 hours. Hgb A1c No results for input(s): "HGBA1C" in the last 72 hours. Lipid Profile No results for input(s): "CHOL", "HDL", "LDLCALC", "TRIG", "CHOLHDL", "LDLDIRECT" in the last 72 hours. Thyroid function studies Recent Labs    01/31/22 0346  TSH 1.134   Anemia work up No results for input(s): "VITAMINB12", "FOLATE", "FERRITIN", "TIBC", "IRON", "RETICCTPCT" in the last 72 hours. Urinalysis    Component Value Date/Time   COLORURINE YELLOW 03/11/2021 0039   APPEARANCEUR CLEAR 03/11/2021 0039   LABSPEC 1.011 03/11/2021 0039   PHURINE 6.0 03/11/2021 0039   GLUCOSEU 50 (A) 03/11/2021 0039   HGBUR NEGATIVE 03/11/2021 0039   BILIRUBINUR NEGATIVE 03/11/2021 0039   KETONESUR NEGATIVE 03/11/2021 0039   PROTEINUR >=300 (A) 03/11/2021 0039   NITRITE NEGATIVE 03/11/2021 0039   LEUKOCYTESUR NEGATIVE 03/11/2021 0039   Sepsis Labs Recent Labs  Lab 01/25/22 0122 01/26/22 0258 01/28/22 0436  WBC 13.8* 11.1* 8.4   Microbiology Recent Results (from the past 240 hour(s))  MRSA Next Gen by PCR, Nasal     Status: None   Collection Time: 01/25/22 11:01 AM   Specimen: Nasal Mucosa;  Nasal Swab  Result Value Ref Range Status   MRSA by PCR Next Gen NOT DETECTED NOT DETECTED Final     Comment: (NOTE) The GeneXpert MRSA Assay (FDA approved for NASAL specimens only), is one component of a comprehensive MRSA colonization surveillance program. It is not intended to diagnose MRSA infection nor to guide or monitor treatment for MRSA infections. Test performance is not FDA approved in patients less than 89 years old. Performed at [facility], [place] [address] [place], [place] [address]   Surgical PCR screen     Status: None   Collection Time: 01/27/22  8:56 PM   Specimen: Nasal Mucosa; Nasal Swab  Result Value Ref Range Status   MRSA, PCR NEGATIVE NEGATIVE Final   Staphylococcus aureus NEGATIVE NEGATIVE Final    Comment: (NOTE) The Xpert SA Assay (FDA approved for NASAL specimens in patients 85 years of age and older), is one component of a comprehensive surveillance program. It is not intended to diagnose infection nor to guide or monitor treatment. Performed at [facility], [place] [address], [place], [place] [address]     Please note: You were cared for by a hospitalist during your hospital stay. Once you are discharged, your primary care physician will handle any further medical issues. Please note that NO REFILLS for any discharge medications will be authorized once you are discharged, as it is imperative that you return to your primary care physician (or establish a relationship with a primary care physician if you do not have one) for your post hospital discharge needs so that they can reassess your need for medications and monitor your lab values.    Time coordinating discharge: 40 minutes  SIGNED:   Shelly Coss, MD  [facility] 01/31/2022, 10:46 AM Pager [phone]  If 7PM-7AM, please contact night-coverage [email or web address] Password [email or web address]

## 2022-01-31 NOTE — TOC Transition Note (Signed)
Transition of Care [facility]) - CM/SW Discharge Note   Patient Details  Name: Tanner Rollins MRN: [id number] Date of Birth: [birth year]  Transition of Care [facility]) CM/SW Contact:  Amador Cunas, [place] Phone Number: 01/31/2022, 12:02 PM   Clinical Narrative: Pt for dc to [facility] today. Spoke to [facility] in admissions who confirmed they are prepared to admit pt to room 215. Packet complete and RN provided with number for report [phone]. Pt and pt's sister aware of dc and report agreeable. SW signing off at dc.   Wandra Feinstein, MSW, LCSW [phone] (coverage)      Final next level of care: Skilled Nursing Facility Barriers to Discharge: No Barriers Identified   Patient Goals and CMS Choice Patient states their goals for this hospitalization and ongoing recovery are:: To return home CMS Medicare.gov Compare Post Acute Care list provided to:: Patient Choice offered to / list presented to : Patient, Sibling  Discharge Placement PASRR number recieved: 01/31/22            Patient chooses bed at: [place] Patient to be transferred to facility by: [place] Name of family member notified: Lavone Patient and family notified of of transfer: 01/31/22  Discharge Plan and Services   Discharge Planning Services: CM Consult                                 Social Determinants of Health ([place]) Interventions     Readmission Risk Interventions     No data to display

## 2022-01-31 NOTE — Plan of Care (Signed)
  Problem: Health Behavior/Discharge Planning: Goal: Ability to manage health-related needs will improve Outcome: Progressing   Problem: Clinical Measurements: Goal: Ability to maintain clinical measurements within normal limits will improve Outcome: Progressing Goal: Will remain free from infection Outcome: Progressing Goal: Diagnostic test results will improve Outcome: Progressing Goal: Cardiovascular complication will be avoided Outcome: Progressing   Problem: Activity: Goal: Risk for activity intolerance will decrease Outcome: Progressing   Problem: Nutrition: Goal: Adequate nutrition will be maintained Outcome: Progressing

## 2022-02-02 ENCOUNTER — Emergency Department (HOSPITAL_COMMUNITY): Payer: Medicare Other

## 2022-02-02 ENCOUNTER — Inpatient Hospital Stay (HOSPITAL_COMMUNITY)
Admission: EM | Admit: 2022-02-02 | Discharge: 2022-02-13 | DRG: 871 | Disposition: A | Discharge status: Skilled Nursing Facility | Payer: Medicare Other | Attending: Internal Medicine | Admitting: Internal Medicine

## 2022-02-02 ENCOUNTER — Encounter (HOSPITAL_COMMUNITY): Payer: Self-pay | Admitting: Emergency Medicine

## 2022-02-02 DIAGNOSIS — F17210 Nicotine dependence, cigarettes, uncomplicated: Secondary | ICD-10-CM | POA: Diagnosis present

## 2022-02-02 DIAGNOSIS — Z79899 Other long term (current) drug therapy: Secondary | ICD-10-CM

## 2022-02-02 DIAGNOSIS — I953 Hypotension of hemodialysis: Secondary | ICD-10-CM | POA: Diagnosis present

## 2022-02-02 DIAGNOSIS — D631 Anemia in chronic kidney disease: Secondary | ICD-10-CM | POA: Diagnosis present

## 2022-02-02 DIAGNOSIS — J90 Pleural effusion, not elsewhere classified: Secondary | ICD-10-CM | POA: Diagnosis present

## 2022-02-02 DIAGNOSIS — N186 End stage renal disease: Secondary | ICD-10-CM | POA: Diagnosis present

## 2022-02-02 DIAGNOSIS — E785 Hyperlipidemia, unspecified: Secondary | ICD-10-CM | POA: Diagnosis present

## 2022-02-02 DIAGNOSIS — I132 Hypertensive heart and chronic kidney disease with heart failure and with stage 5 chronic kidney disease, or end stage renal disease: Secondary | ICD-10-CM | POA: Diagnosis present

## 2022-02-02 DIAGNOSIS — G9341 Metabolic encephalopathy: Secondary | ICD-10-CM | POA: Diagnosis present

## 2022-02-02 DIAGNOSIS — I4821 Permanent atrial fibrillation: Secondary | ICD-10-CM | POA: Diagnosis present

## 2022-02-02 DIAGNOSIS — Z6822 Body mass index (BMI) 22.0-22.9, adult: Secondary | ICD-10-CM

## 2022-02-02 DIAGNOSIS — M898X9 Other specified disorders of bone, unspecified site: Secondary | ICD-10-CM | POA: Diagnosis present

## 2022-02-02 DIAGNOSIS — R6521 Severe sepsis with septic shock: Secondary | ICD-10-CM | POA: Diagnosis present

## 2022-02-02 DIAGNOSIS — I161 Hypertensive emergency: Secondary | ICD-10-CM | POA: Diagnosis present

## 2022-02-02 DIAGNOSIS — I480 Paroxysmal atrial fibrillation: Secondary | ICD-10-CM | POA: Diagnosis present

## 2022-02-02 DIAGNOSIS — N2581 Secondary hyperparathyroidism of renal origin: Secondary | ICD-10-CM | POA: Diagnosis present

## 2022-02-02 DIAGNOSIS — J189 Pneumonia, unspecified organism: Secondary | ICD-10-CM | POA: Diagnosis present

## 2022-02-02 DIAGNOSIS — R400 Somnolence: Secondary | ICD-10-CM | POA: Diagnosis present

## 2022-02-02 DIAGNOSIS — I4891 Unspecified atrial fibrillation: Secondary | ICD-10-CM

## 2022-02-02 DIAGNOSIS — R5381 Other malaise: Secondary | ICD-10-CM | POA: Diagnosis present

## 2022-02-02 DIAGNOSIS — E43 Unspecified severe protein-calorie malnutrition: Secondary | ICD-10-CM | POA: Diagnosis present

## 2022-02-02 DIAGNOSIS — J9601 Acute respiratory failure with hypoxia: Secondary | ICD-10-CM | POA: Diagnosis present

## 2022-02-02 DIAGNOSIS — Z7901 Long term (current) use of anticoagulants: Secondary | ICD-10-CM

## 2022-02-02 DIAGNOSIS — E1122 Type 2 diabetes mellitus with diabetic chronic kidney disease: Secondary | ICD-10-CM | POA: Diagnosis present

## 2022-02-02 DIAGNOSIS — N189 Chronic kidney disease, unspecified: Secondary | ICD-10-CM | POA: Diagnosis present

## 2022-02-02 DIAGNOSIS — R579 Shock, unspecified: Secondary | ICD-10-CM

## 2022-02-02 DIAGNOSIS — A419 Sepsis, unspecified organism: Principal | ICD-10-CM | POA: Diagnosis present

## 2022-02-02 DIAGNOSIS — Z8249 Family history of ischemic heart disease and other diseases of the circulatory system: Secondary | ICD-10-CM

## 2022-02-02 DIAGNOSIS — I952 Hypotension due to drugs: Principal | ICD-10-CM

## 2022-02-02 DIAGNOSIS — R5383 Other fatigue: Secondary | ICD-10-CM

## 2022-02-02 DIAGNOSIS — Z992 Dependence on renal dialysis: Secondary | ICD-10-CM | POA: Diagnosis not present

## 2022-02-02 HISTORY — DX: Dependence on renal dialysis: N18.6

## 2022-02-02 HISTORY — DX: End stage renal disease: Z99.2

## 2022-02-02 HISTORY — DX: End stage renal disease: N18.6

## 2022-02-02 LAB — BASIC METABOLIC PANEL
Anion gap: 12 (ref 5–15)
BUN: 19 mg/dL (ref 8–23)
CO2: 26 mmol/L (ref 22–32)
Calcium: 8.4 mg/dL — ABNORMAL LOW (ref 8.9–10.3)
Chloride: 100 mmol/L (ref 98–111)
Creatinine, Ser: 6.18 mg/dL — ABNORMAL HIGH (ref 0.61–1.24)
GFR, Estimated: 9 mL/min — ABNORMAL LOW (ref >60)
Glucose, Bld: 102 mg/dL — ABNORMAL HIGH (ref 70–99)
Potassium: 4.1 mmol/L (ref 3.5–5.1)
Sodium: 138 mmol/L (ref 135–145)

## 2022-02-02 LAB — I-STAT CHEM 8, ED
BUN: 19 mg/dL (ref 8–23)
Calcium, Ion: 1.02 mmol/L — ABNORMAL LOW (ref 1.15–1.40)
Chloride: 101 mmol/L (ref 98–111)
Creatinine, Ser: 5.8 mg/dL — ABNORMAL HIGH (ref 0.61–1.24)
Glucose, Bld: 112 mg/dL — ABNORMAL HIGH (ref 70–99)
HCT: 27 % — ABNORMAL LOW (ref 39.0–52.0)
Hemoglobin: 9.2 g/dL — ABNORMAL LOW (ref 13.0–17.0)
Potassium: 3.7 mmol/L (ref 3.5–5.1)
Sodium: 136 mmol/L (ref 135–145)
TCO2: 28 mmol/L (ref 22–32)

## 2022-02-02 LAB — CBC WITH DIFFERENTIAL/PLATELET
Abs Immature Granulocytes: 0.09 10*3/uL — ABNORMAL HIGH (ref 0.00–0.07)
Basophils Absolute: 0 10*3/uL (ref 0.0–0.1)
Basophils Relative: 0 %
Eosinophils Absolute: 0 10*3/uL (ref 0.0–0.5)
Eosinophils Relative: 0 %
HCT: 27 % — ABNORMAL LOW (ref 39.0–52.0)
Hemoglobin: 8.4 g/dL — ABNORMAL LOW (ref 13.0–17.0)
Immature Granulocytes: 1 %
Lymphocytes Relative: 18 %
Lymphs Abs: 1.6 10*3/uL (ref 0.7–4.0)
MCH: 28.9 pg (ref 26.0–34.0)
MCHC: 31.1 g/dL (ref 30.0–36.0)
MCV: 92.8 fL (ref 80.0–100.0)
Monocytes Absolute: 0.9 10*3/uL (ref 0.1–1.0)
Monocytes Relative: 11 %
Neutro Abs: 6 10*3/uL (ref 1.7–7.7)
Neutrophils Relative %: 70 %
Platelets: 288 10*3/uL (ref 150–400)
RBC: 2.91 MIL/uL — ABNORMAL LOW (ref 4.22–5.81)
RDW: 15.3 % (ref 11.5–15.5)
WBC: 8.6 10*3/uL (ref 4.0–10.5)
nRBC: 0.5 % — ABNORMAL HIGH (ref 0.0–0.2)

## 2022-02-02 LAB — CBG MONITORING, ED: Glucose-Capillary: 130 mg/dL — ABNORMAL HIGH (ref 70–99)

## 2022-02-02 LAB — CBC
HCT: 23.4 % — ABNORMAL LOW (ref 39.0–52.0)
HCT: 22.9 % — ABNORMAL LOW (ref 39.0–52.0)
Hemoglobin: 7.2 g/dL — ABNORMAL LOW (ref 13.0–17.0)
Hemoglobin: 7.1 g/dL — ABNORMAL LOW (ref 13.0–17.0)
MCH: 28.9 pg (ref 26.0–34.0)
MCH: 28.4 pg (ref 26.0–34.0)
MCHC: 30.8 g/dL (ref 30.0–36.0)
MCHC: 31 g/dL (ref 30.0–36.0)
MCV: 94 fL (ref 80.0–100.0)
MCV: 91.6 fL (ref 80.0–100.0)
Platelets: 250 10*3/uL (ref 150–400)
Platelets: 260 10*3/uL (ref 150–400)
RBC: 2.49 MIL/uL — ABNORMAL LOW (ref 4.22–5.81)
RBC: 2.5 MIL/uL — ABNORMAL LOW (ref 4.22–5.81)
RDW: 15.8 % — ABNORMAL HIGH (ref 11.5–15.5)
RDW: 15.3 % (ref 11.5–15.5)
WBC: 9.2 10*3/uL (ref 4.0–10.5)
WBC: 8.9 10*3/uL (ref 4.0–10.5)
nRBC: 0.2 % (ref 0.0–0.2)
nRBC: 0.3 % — ABNORMAL HIGH (ref 0.0–0.2)

## 2022-02-02 LAB — CREATININE, SERUM
Creatinine, Ser: 6.12 mg/dL — ABNORMAL HIGH (ref 0.61–1.24)
GFR, Estimated: 9 mL/min — ABNORMAL LOW (ref >60)

## 2022-02-02 LAB — COMPREHENSIVE METABOLIC PANEL
ALT: 33 U/L (ref 0–44)
AST: 117 U/L — ABNORMAL HIGH (ref 15–41)
Albumin: 2 g/dL — ABNORMAL LOW (ref 3.5–5.0)
Alkaline Phosphatase: 89 U/L (ref 38–126)
Anion gap: 15 (ref 5–15)
BUN: 19 mg/dL (ref 8–23)
CO2: 24 mmol/L (ref 22–32)
Calcium: 8.3 mg/dL — ABNORMAL LOW (ref 8.9–10.3)
Chloride: 99 mmol/L (ref 98–111)
Creatinine, Ser: 6.01 mg/dL — ABNORMAL HIGH (ref 0.61–1.24)
GFR, Estimated: 9 mL/min — ABNORMAL LOW (ref >60)
Glucose, Bld: 107 mg/dL — ABNORMAL HIGH (ref 70–99)
Potassium: 4.8 mmol/L (ref 3.5–5.1)
Sodium: 138 mmol/L (ref 135–145)
Total Bilirubin: 1.1 mg/dL (ref 0.3–1.2)
Total Protein: 5.4 g/dL — ABNORMAL LOW (ref 6.5–8.1)

## 2022-02-02 LAB — I-STAT VENOUS BLOOD GAS, ED
Acid-Base Excess: 5 mmol/L — ABNORMAL HIGH (ref 0.0–2.0)
Bicarbonate: 29.2 mmol/L — ABNORMAL HIGH (ref 20.0–28.0)
Calcium, Ion: 1.03 mmol/L — ABNORMAL LOW (ref 1.15–1.40)
HCT: 26 % — ABNORMAL LOW (ref 39.0–52.0)
Hemoglobin: 8.8 g/dL — ABNORMAL LOW (ref 13.0–17.0)
O2 Saturation: 99 %
Potassium: 3.8 mmol/L (ref 3.5–5.1)
Sodium: 137 mmol/L (ref 135–145)
TCO2: 30 mmol/L (ref 22–32)
pCO2, Ven: 40.6 mmHg — ABNORMAL LOW (ref 44–60)
pH, Ven: 7.464 — ABNORMAL HIGH (ref 7.25–7.43)
pO2, Ven: 133 mmHg — ABNORMAL HIGH (ref 32–45)

## 2022-02-02 LAB — PROTIME-INR
INR: 1.3 — ABNORMAL HIGH (ref 0.8–1.2)
Prothrombin Time: 15.8 seconds — ABNORMAL HIGH (ref 11.4–15.2)

## 2022-02-02 LAB — HEMOGLOBIN A1C
Hgb A1c MFr Bld: 5.4 % (ref 4.8–5.6)
Mean Plasma Glucose: 108.28 mg/dL

## 2022-02-02 LAB — TSH: TSH: 1.79 u[IU]/mL (ref 0.350–4.500)

## 2022-02-02 LAB — GLUCOSE, CAPILLARY: Glucose-Capillary: 107 mg/dL — ABNORMAL HIGH (ref 70–99)

## 2022-02-02 LAB — PHOSPHORUS
Phosphorus: 3.8 mg/dL (ref 2.5–4.6)
Phosphorus: 4.7 mg/dL — ABNORMAL HIGH (ref 2.5–4.6)

## 2022-02-02 LAB — MAGNESIUM
Magnesium: 2 mg/dL (ref 1.7–2.4)
Magnesium: 1.8 mg/dL (ref 1.7–2.4)

## 2022-02-02 LAB — PROCALCITONIN: Procalcitonin: 3.44 ng/mL

## 2022-02-02 LAB — APTT: aPTT: 33 seconds (ref 24–36)

## 2022-02-02 LAB — MRSA NEXT GEN BY PCR, NASAL: MRSA by PCR Next Gen: NOT DETECTED

## 2022-02-02 LAB — LACTIC ACID, PLASMA
Lactic Acid, Venous: 2.3 mmol/L (ref 0.5–1.9)
Lactic Acid, Venous: 1.8 mmol/L (ref 0.5–1.9)

## 2022-02-02 MED ORDER — POLYETHYLENE GLYCOL 3350 17 G PO PACK
17.0000 g | PACK | Freq: Every day | ORAL | Status: DC | PRN
  Filled 2022-02-02: qty 1

## 2022-02-02 MED ORDER — APIXABAN 5 MG PO TABS
5.0000 mg | ORAL_TABLET | Freq: Two times a day (BID) | ORAL | Status: DC
  Administered 2022-02-02 – 2022-02-13 (×22): 5 mg via ORAL
  Filled 2022-02-02 (×22): qty 1

## 2022-02-02 MED ORDER — HEPARIN SODIUM (PORCINE) 5000 UNIT/ML IJ SOLN: 5000.0000 [IU] | Freq: Three times a day (TID) | INTRAMUSCULAR | Status: DC

## 2022-02-02 MED ORDER — SODIUM CHLORIDE 0.9 % IV SOLN
1.0000 g | INTRAVENOUS | Status: AC
  Administered 2022-02-02 – 2022-02-06 (×5): 1 g via INTRAVENOUS
  Filled 2022-02-02 (×6): qty 10

## 2022-02-02 MED ORDER — INSULIN ASPART 100 UNIT/ML IJ SOLN: 0.0000 [IU] | Freq: Every day | INTRAMUSCULAR | Status: DC

## 2022-02-02 MED ORDER — CALCITRIOL 0.25 MCG PO CAPS
0.2500 ug | ORAL_CAPSULE | ORAL | Status: DC
Start: 2022-02-04 — End: 2022-02-05
  Administered 2022-02-04: 0.25 ug via ORAL
  Filled 2022-02-02 (×3): qty 1

## 2022-02-02 MED ORDER — INSULIN ASPART 100 UNIT/ML IJ SOLN: 0.0000 [IU] | Freq: Three times a day (TID) | INTRAMUSCULAR | Status: DC

## 2022-02-02 MED ORDER — ORAL CARE MOUTH RINSE: 15.0000 mL | OROMUCOSAL | Status: DC | PRN

## 2022-02-02 MED ORDER — VANCOMYCIN HCL 1500 MG/300ML IV SOLN
1500.0000 mg | Freq: Once | INTRAVENOUS | Status: AC
  Administered 2022-02-02: 1500 mg via INTRAVENOUS
  Filled 2022-02-02 (×2): qty 300

## 2022-02-02 MED ORDER — DOCUSATE SODIUM 100 MG PO CAPS: 100.0000 mg | ORAL_CAPSULE | Freq: Two times a day (BID) | ORAL | Status: DC | PRN

## 2022-02-02 MED ORDER — CHLORHEXIDINE GLUCONATE CLOTH 2 % EX PADS
6.0000 | MEDICATED_PAD | Freq: Every day | CUTANEOUS | Status: DC
  Administered 2022-02-02: 6 via TOPICAL

## 2022-02-02 MED ORDER — ENSURE ENLIVE PO LIQD
237.0000 mL | Freq: Two times a day (BID) | ORAL | Status: DC
  Administered 2022-02-03 – 2022-02-04 (×2): 237 mL via ORAL

## 2022-02-02 MED ORDER — CALCIUM ACETATE (PHOS BINDER) 667 MG PO CAPS
1334.0000 mg | ORAL_CAPSULE | Freq: Three times a day (TID) | ORAL | Status: DC
  Filled 2022-02-02 (×2): qty 2

## 2022-02-02 MED ORDER — DOCUSATE SODIUM 100 MG PO CAPS
100.0000 mg | ORAL_CAPSULE | Freq: Two times a day (BID) | ORAL | Status: DC
  Administered 2022-02-02 – 2022-02-13 (×21): 100 mg via ORAL
  Filled 2022-02-02 (×21): qty 1

## 2022-02-02 MED ORDER — POLYETHYLENE GLYCOL 3350 17 G PO PACK: 17.0000 g | PACK | Freq: Every day | ORAL | Status: DC | PRN

## 2022-02-02 MED ORDER — SODIUM CHLORIDE 0.9 % IV BOLUS
500.0000 mL | Freq: Once | INTRAVENOUS | Status: AC
  Administered 2022-02-02: 500 mL via INTRAVENOUS

## 2022-02-02 MED ORDER — LACTATED RINGERS IV BOLUS (SEPSIS)
1000.0000 mL | Freq: Once | INTRAVENOUS | Status: AC
  Administered 2022-02-02: 1000 mL via INTRAVENOUS

## 2022-02-02 MED ORDER — SODIUM CHLORIDE 0.9 % IV SOLN: INTRAVENOUS | Status: DC | PRN

## 2022-02-02 NOTE — H&P (Signed)
NAME:  Tanner Rollins, MRN:  [id number], DOB:  [birth year], LOS: 0 ADMISSION DATE:  02/02/2022, CONSULTATION DATE:  6/17 REFERRING MD:  nanavati, CHIEF COMPLAINT:  hypotension    History of Present Illness:  This is a 71 year old male pt who was just discharged to SNF on 6/15 after being admitted for CP/HTn and Af w/ RVR. During this stay he was started on CCB, started HD and was eventually discharged to SNF w/ plans to cont rehab efforts and iHD. Presented to ER from HD center after on arrival to HD center from SNF found to be lethargic and hypotensive w/ SBP in 80s and HR 90s-130s. Was seen by EDP wbc ct nml, hgb actually up to 8.4 from 7.5 from time of D/c, cxr HD cath in place, bilateral effusion. Cultures sent, abx started. IVF bolus administered w/ some improvement on mental status w/ SBP up to mid-90s. PCCM asked to admit   Pertinent  Medical History  ESRD Poorly controlled hypertension Hypertensive emergency  Afib w/ RVR Prior respiratory failure Anemia iPTH Metabolic bone disease Significant Hospital Events: Including procedures, antibiotic start and stop dates in addition to other pertinent events   6/17 admitted w/ hypotension. Likely 2/2 CCB and BB. Cultures sent. Abx started. Responded some to IVFs Interim History / Subjective:  Feels weak   Objective   Blood pressure 93/63, pulse (Abnormal) 102, temperature 97.6 F (36.4 C), temperature source Oral, resp. rate 10, SpO2 95 %.       No intake or output data in the 24 hours ending 02/02/22 1840 There were no vitals filed for this visit.  Examination: General: frail 71 year old male resting in bed  HENT: NCAT some temporal wasting  Lungs: basilar crackles bases PCXR w/ bilateral effusions and mild pulm edema  Cardiovascular: irreg irreg w/ systolic HM Abdomen: soft  Extremities: warm dry LUE fistula w/ good bruit and thrill Neuro: awake oriented no focal def. Weak,  GU: due to void  Resolved Hospital Problem list      Assessment & Plan:  Principal Problem:   Shock ([place]) Active Problems:   Lethargy   ESRD (end stage renal disease) on dialysis ([place])   A-fib ([place])   Anemia of renal disease   Metabolic bone disease   Pleural effusion  Undifferentiated shock At this point favor 2/2 CCB and BB.  Has received IVFs, cultures sent Plan Cont IVFs Hold CCB and BB Empiric abx Tele Might need midodrine   Lethargy  Responding to treatment of hypotension Plan Supportive care F/u cultures   ESRD w/ metabolic bone disease no immediate need for HD Plan Renal consult  Bilateral pleural effusion w/ mild pulmonary edema Plan KVO IVFs after next bolus PRn CXR F/u pcxr after hd  Afib Plan Tele Cont doac Holding meds as above   Best Practice (right click and "Reselect all SmartList Selections" daily)   Diet/type: Regular consistency (see orders) DVT prophylaxis: DOAC GI prophylaxis: N/A Lines: Dialysis Catheter Foley:  N/A Code Status:  full code Last date of multidisciplinary goals of care discussion [pending]  Labs   CBC: Recent Labs  Lab 01/28/22 0436 01/31/22 0346 02/02/22 1712 02/02/22 1801 02/02/22 1802  WBC 8.4 7.1 8.6  --   --   NEUTROABS  --   --  6.0  --   --   HGB 7.4* 7.5* 8.4* 9.2* 8.8*  HCT 22.5* 23.0* 27.0* 27.0* 26.0*  MCV 85.9 89.1 92.8  --   --   PLT  217 256 288  --   --     Basic Metabolic Panel: Recent Labs  Lab 01/28/22 0436 02/02/22 1801 02/02/22 1802  NA 134* 136 137  K 4.1 3.7 3.8  CL 96* 101  --   CO2 23  --   --   GLUCOSE 95 112*  --   BUN 86* 19  --   CREATININE 15.36* 5.80*  --   CALCIUM 8.4*  --   --    GFR: Estimated Creatinine Clearance: 12.6 mL/min (A) (by C-G formula based on SCr of 5.8 mg/dL (H)). Recent Labs  Lab 01/28/22 0436 01/31/22 0346 02/02/22 1712  WBC 8.4 7.1 8.6    Liver Function Tests: No results for input(s): "AST", "ALT", "ALKPHOS", "BILITOT", "PROT", "ALBUMIN" in the last 168 hours. No results for  input(s): "LIPASE", "AMYLASE" in the last 168 hours. No results for input(s): "AMMONIA" in the last 168 hours.  ABG    Component Value Date/Time   PHART 7.393 01/24/2022 0611   PCO2ART 19.2 (LL) 01/24/2022 0611   PO2ART 60 (L) 01/24/2022 0611   HCO3 29.2 (H) 02/02/2022 1802   TCO2 30 02/02/2022 1802   ACIDBASEDEF 12.0 (H) 01/24/2022 0611   O2SAT 99 02/02/2022 1802     Coagulation Profile: Recent Labs  Lab 02/02/22 1712  INR 1.3*    Cardiac Enzymes: No results for input(s): "CKTOTAL", "CKMB", "CKMBINDEX", "TROPONINI" in the last 168 hours.  HbA1C: Hgb A1c MFr Bld  Date/Time Value Ref Range Status  03/10/2021 05:06 PM 5.5 4.8 - 5.6 % Final    Comment:    (NOTE)         Prediabetes: 5.7 - 6.4         Diabetes: >6.4         Glycemic control for adults with diabetes: <7.0     CBG: Recent Labs  Lab 02/02/22 1729  GLUCAP 130*    Review of Systems:   Review of Systems  Constitutional:  Positive for fever and malaise/fatigue.  HENT: Negative.    Eyes: Negative.   Respiratory: Negative.    Cardiovascular: Negative.   Gastrointestinal: Negative.   Genitourinary: Negative.   Musculoskeletal: Negative.   Skin: Negative.   Neurological: Negative.   Endo/Heme/Allergies: Negative.   Psychiatric/Behavioral: Negative.       Past Medical History:  He,  has a past medical history of CKD (chronic kidney disease) stage 4, GFR 15-29 ml/min (HCC) and Hypertension.   Surgical History:   Past Surgical History:  Procedure Laterality Date   AV FISTULA PLACEMENT Left 01/28/2022   Procedure: ARTERIOVENOUS (AV) FISTULA CREATION VS.GRAFT ARM;  Surgeon: Angelia Mould, MD;  Location: [facility];  Service: Vascular;  Laterality: Left;   IR FLUORO GUIDE CV LINE RIGHT  01/24/2022   IR US GUIDE VASC ACCESS RIGHT  01/24/2022     Social History:   reports that he has been smoking cigarettes. He does not have any smokeless tobacco history on file.   Family History:  His family  history is not on file.   Allergies No Known Allergies   Home Medications  Prior to Admission medications   Medication Sig Start Date End Date Taking? Authorizing Provider  apixaban (ELIQUIS) 5 MG TABS tablet Take 1 tablet (5 mg total) by mouth 2 (two) times daily. 01/31/22   Shelly Coss, MD  atorvastatin (LIPITOR) 40 MG tablet Take 1 tablet (40 mg total) by mouth daily. Patient not taking: Reported on 01/23/2022 03/12/21 04/11/21  Buddy Duty  N, DO  calcitRIOL (ROCALTROL) 0.25 MCG capsule Take 1 capsule (0.25 mcg total) by mouth every Monday, Wednesday, and Friday. 02/01/22   Shelly Coss, MD  calcium acetate (PHOSLO) 667 MG capsule Take 2 capsules (1,334 mg total) by mouth 3 (three) times daily with meals. 01/31/22   Shelly Coss, MD  carvedilol (COREG) 25 MG tablet Take 1 tablet (25 mg total) by mouth 2 (two) times daily with a meal. 01/31/22   Shelly Coss, MD  diltiazem (CARDIZEM CD) 180 MG 24 hr capsule Take 1 capsule (180 mg total) by mouth daily. 02/01/22   Shelly Coss, MD  docusate sodium (COLACE) 100 MG capsule Take 1 capsule (100 mg total) by mouth 2 (two) times daily. 01/31/22   Shelly Coss, MD  polyethylene glycol (MIRALAX / GLYCOLAX) 17 g packet Take 17 g by mouth daily as needed for moderate constipation. 01/31/22   Shelly Coss, MD     Critical care time: 62 min     Erick Colace ACNP-BC [facility] Pager # [phone] OR # [phone] if no answer

## 2022-02-02 NOTE — ED Notes (Signed)
Report given to [place], Therapist, sports. (46M)

## 2022-02-02 NOTE — ED Notes (Signed)
Lab calls. Recommendation to re-draw CMP.

## 2022-02-02 NOTE — ED Provider Notes (Signed)
[facility] EMERGENCY DEPARTMENT Provider Note   CSN: [id number] Arrival date & time: 02/02/22  1700     History  Chief Complaint  Patient presents with   Hypotension    Tanner Rollins is a 71 y.o. male.  HPI     71 year old male with past medical history of hypertension, ESRD on hemodialysis, recent diagnosis of A-fib on Eliquis and diltiazem comes in with chief complaint of low blood pressure.  Patient brought here from his dialysis site by paramedics.  Patient indicates that today was his first dialysis session.  Dialysis site noted that patient was 2 pounds underweight, hypotensive and lethargic.   Patient is noted to be somnolent.  He is arousable to verbal stimuli.  He indicates that he has been feeling unwell since yesterday, but was not feeling as bad yesterday as he does today.  He indicates chest pain, but is not new.  He denies any nausea, vomiting, diarrhea, abdominal pain, headache, neck pain.  No recent illness.  Home Medications Prior to Admission medications   Medication Sig Start Date End Date Taking? Authorizing Provider  apixaban (ELIQUIS) 5 MG TABS tablet Take 1 tablet (5 mg total) by mouth 2 (two) times daily. 01/31/22   Shelly Coss, MD  atorvastatin (LIPITOR) 40 MG tablet Take 1 tablet (40 mg total) by mouth daily. Patient not taking: Reported on 01/23/2022 03/12/21 04/11/21  Dorethea Clan, DO  calcitRIOL (ROCALTROL) 0.25 MCG capsule Take 1 capsule (0.25 mcg total) by mouth every Monday, Wednesday, and Friday. 02/01/22   Shelly Coss, MD  calcium acetate (PHOSLO) 667 MG capsule Take 2 capsules (1,334 mg total) by mouth 3 (three) times daily with meals. 01/31/22   Shelly Coss, MD  carvedilol (COREG) 25 MG tablet Take 1 tablet (25 mg total) by mouth 2 (two) times daily with a meal. 01/31/22   Shelly Coss, MD  diltiazem (CARDIZEM CD) 180 MG 24 hr capsule Take 1 capsule (180 mg total) by mouth daily. 02/01/22   Shelly Coss, MD   docusate sodium (COLACE) 100 MG capsule Take 1 capsule (100 mg total) by mouth 2 (two) times daily. 01/31/22   Shelly Coss, MD  polyethylene glycol (MIRALAX / GLYCOLAX) 17 g packet Take 17 g by mouth daily as needed for moderate constipation. 01/31/22   Shelly Coss, MD      Allergies    Patient has no known allergies.    Review of Systems   Review of Systems  Physical Exam Updated Vital Signs BP 93/63   Pulse (!) 102   Temp 97.6 F (36.4 C) (Oral)   Resp 10   SpO2 95%  Physical Exam Vitals and nursing note reviewed.  Constitutional:      Appearance: He is well-developed.  HENT:     Head: Atraumatic.  Cardiovascular:     Rate and Rhythm: Normal rate.  Pulmonary:     Effort: Pulmonary effort is normal.  Musculoskeletal:     Cervical back: Neck supple.  Skin:    General: Skin is warm.  Neurological:     Mental Status: He is oriented to person, place, and time.     ED Results / Procedures / Treatments   Labs (all labs ordered are listed, but only abnormal results are displayed) Labs Reviewed  CBC WITH DIFFERENTIAL/PLATELET - Abnormal; Notable for the following components:      Result Value   RBC 2.91 (*)    Hemoglobin 8.4 (*)    HCT 27.0 (*)  nRBC 0.5 (*)    Abs Immature Granulocytes 0.09 (*)    All other components within normal limits  PROTIME-INR - Abnormal; Notable for the following components:   Prothrombin Time 15.8 (*)    INR 1.3 (*)    All other components within normal limits  COMPREHENSIVE METABOLIC PANEL - Abnormal; Notable for the following components:   Glucose, Bld 107 (*)    Creatinine, Ser 6.01 (*)    Calcium 8.3 (*)    Total Protein 5.4 (*)    Albumin 2.0 (*)    AST 117 (*)    GFR, Estimated 9 (*)    All other components within normal limits  I-STAT CHEM 8, ED - Abnormal; Notable for the following components:   Creatinine, Ser 5.80 (*)    Glucose, Bld 112 (*)    Calcium, Ion 1.02 (*)    Hemoglobin 9.2 (*)    HCT 27.0 (*)     All other components within normal limits  I-STAT VENOUS BLOOD GAS, ED - Abnormal; Notable for the following components:   pH, Ven 7.464 (*)    pCO2, Ven 40.6 (*)    pO2, Ven 133 (*)    Bicarbonate 29.2 (*)    Acid-Base Excess 5.0 (*)    Calcium, Ion 1.03 (*)    HCT 26.0 (*)    Hemoglobin 8.8 (*)    All other components within normal limits  CBG MONITORING, ED - Abnormal; Notable for the following components:   Glucose-Capillary 130 (*)    All other components within normal limits  CULTURE, BLOOD (ROUTINE X 2)  CULTURE, BLOOD (ROUTINE X 2)  APTT  MAGNESIUM  PHOSPHORUS  LACTIC ACID, PLASMA  URINALYSIS, ROUTINE W REFLEX MICROSCOPIC  TSH    EKG EKG Interpretation  Date/Time:  Saturday February 02 2022 17:08:29 EDT Ventricular Rate:  88 PR Interval:    QRS Duration: 94 QT Interval:  399 QTC Calculation: 483 R Axis:   8 Text Interpretation: Atrial flutter LVH with secondary repolarization abnormality Anterior infarct, old No significant change since last tracing Confirmed by Varney Biles ([id number]) on 02/02/2022 5:40:55 PM  Radiology DG Chest Port 1 View  Result Date: 02/02/2022 CLINICAL DATA:  Sepsis. EXAM: PORTABLE CHEST 1 VIEW COMPARISON:  01/30/2022 and prior studies FINDINGS: Enlargement of the cardiopericardial silhouette and RIGHT IJ central venous catheter with tip overlying the SUPERIOR cavoatrial junction again noted. Pulmonary vascular congestion again noted. Bilateral LOWER lung opacities again noted, LEFT-greater-than-RIGHT. Bilateral pleural effusions are again identified. There is no evidence of pneumothorax. IMPRESSION: Unchanged chest radiograph with cardiomegaly, pulmonary vascular congestion, and bilateral LOWER lung opacities/pleural effusions. Electronically Signed   By: Margarette Canada M.D.   On: 02/02/2022 17:32    Procedures .Critical Care  Performed by: Varney Biles, MD Authorized by: Varney Biles, MD   Critical care provider statement:    Critical care  time (minutes):  80   Critical care was necessary to treat or prevent imminent or life-threatening deterioration of the following conditions:  Dehydration, renal failure and CNS failure or compromise   Critical care was time spent personally by me on the following activities:  Development of treatment plan with patient or surrogate, discussions with consultants, evaluation of patient's response to treatment, examination of patient, ordering and review of laboratory studies, ordering and review of radiographic studies, ordering and performing treatments and interventions, pulse oximetry, re-evaluation of patient's condition and review of old charts     Medications Ordered in ED Medications  ceFEPIme (MAXIPIME)  1 g in sodium chloride 0.9 % 100 mL IVPB (has no administration in time range)  vancomycin (VANCOREADY) IVPB 1500 mg/300 mL (has no administration in time range)  lactated ringers bolus 1,000 mL (0 mLs Intravenous Stopped 02/02/22 1912)  sodium chloride 0.9 % bolus 500 mL (500 mLs Intravenous New Bag/Given 02/02/22 1912)    ED Course/ Medical Decision Making/ A&P Clinical Course as of 02/02/22 2021  Sat Feb 02, 2022  1740 I called the nursing home on 2 separate occasions within 20 minutes of patient arrival.  Unfortunately, on both occasions nobody is picking up once the phone is transferred by the secretary. [AN]  1750 I discussed the case with poison control team before.  I reviewed patient's MAR.  It seems like he was on diltiazem drip followed by initiation of extended release diltiazem on 14th of June.  Question as to poison control is that in the setting of new dialysis, is it possible that patient has overdosed on diltiazem?  Is it possible to overdose on diltiazem with heart rate > 80?  If they still think this is overdose, does patient need high-dose insulin, glucagon, calcium?  Poison control team is going to review the case and call me back [AN]  1951 Family at bedside now.  They  mentioned that patient actually went for dialysis at 9 AM, and most likely he had full session of dialysis.  This is different information provided to [place] versus EMS.  Additionally, I have consulted pharmacy to review patient's medications.  It appears that the diltiazem is given in the evening.  Therefore patient most likely did not receive any diltiazem in the morning. I have repaged CCM to discuss this new information. They have already put antibiotic orders, which is reassuring.  Suspicion now is that most likely patient is having hypovolemic hypotension and perhaps not medication induced hypotension. [AN]  2018 Critical care team made aware of the new information that family provided.  On repeat evaluation, patient remains stable.  Blood pressure in the 90s consistently.  Heart rate still elevated in the 100 range.  No fever still.  Nursing staff will be starting antibiotics soon. [AN]    Clinical Course User Index [AN] Varney Biles, MD                           Medical Decision Making Amount and/or Complexity of Data Reviewed Labs: ordered. Radiology: ordered. ECG/medicine tests: ordered.  Risk Prescription drug management. Decision regarding hospitalization.  This patient presents to the ED with chief complaint(s) of hypotension, lethargy with pertinent past medical history of ESRD on HD -with hemodialysis started during his last admission, new diagnosis of A-fib with addition of diltiazem and a discharge from hospital to nursing home on 6/15 which further complicates the presenting complaint. The complaint involves an extensive differential diagnosis and also carries with it a high risk of complications and morbidity.    The differential diagnosis includes : Hypovolemic shock, medication induced hypotension, septic shock, AAA, cardiogenic shock  Patient's skin is warm to touch, therefore doubt this is cardiogenic shock. Patient had a CT scan 2 or 3 years ago, there was a small  AAA.  On abdominal exam there is no palpable mass and no tenderness, doubt this is AAA.  EMS indicates that patient went to dialysis, his blood pressure was low, therefore he was sent to the ER.  Suspicion for hypovolemic shock is low because of that.  Patient  does not appear profoundly dry.  He has received 200 cc of IV fluid, blood pressure is improved from 02M to 33K systolic.  Patient's heart rate is anywhere between 8210.  One of the thoughts is that the diltiazem 180 mg that was started recently, could be contributing.  Upon my review, it seems like diltiazem needs to be given on dialysis days only and not every day, which further makes me suspicious that this is medications induced hypotension.  Clinically does not appear that patient has meningitis or encephalitis, as he was just discharged on 6/15 and he indicates that he started feeling really bad this morning.  Also no headaches, no meningismus which is reassuring.  The initial plan is to get basic labs, give patient another IV fluid bolus and acquire more information by calling the nursing home. We will also consider discussing case with poison control.  I will also consult pharmacy.   Additional history obtained: Additional history obtained from EMS  Records reviewed previous admission documents  Independent labs interpretation:  The following labs were independently interpreted: Hemoglobin is at baseline.  Patient's white count is normal.  There is no hyperkalemia.  Blood gas reveals metabolic alkalosis.  Independent visualization of imaging: - I independently visualized the following imaging with scope of interpretation limited to determining acute life threatening conditions related to emergency care: X-ray of the chest, which revealed no significant volume overload  Treatment and Reassessment: Patient's blood pressure has improved.  Heart rate has remained between 90 and 110. Systolic has been as high as 110, but staying  mostly in the upper 80s and low 90s now.  Consultation: - Consulted or discussed management/test interpretation w/ external professional: Pharmacy, ICU and poison control.  See the recommendations in the work-up space.    Final Clinical Impression(s) / ED Diagnoses Final diagnoses:  Hypotension due to medication  Somnolence    Rx / DC Orders ED Discharge Orders          Ordered    Amb referral to AFIB Clinic        02/02/22 [place], Aanyah Loa, MD 02/02/22 2021

## 2022-02-02 NOTE — ED Triage Notes (Signed)
Pt arrives to ED BIB [facility] from Dialysis due to Hypotension. Per EMS Dialysis called stating pt was "20lbs underweight" and was not able to get dialysis today. Pt lethargic on arrival. Pt having difficulty answering questions. EDP at bedside upon pt's arrival.  BP 80/55 HR 90-133 (Afib) CBG 142

## 2022-02-02 NOTE — Consult Note (Signed)
Renal Service Consult Note [facility]  Rithy Mandley 02/02/2022 Sol Blazing, MD Requesting Physician: Dr. Lamonte Sakai  Reason for Consult: ESRD pt w/ shock HPI: The patient is a 71 y.o. year-old w/ hx of HTN and ESRD on HD, recently started HD here during an admission for uncont HTN, afib and A/C renal faliure. Pt was here from 6/7- 6/15. Pt recommended SNF on dc due to debility/ deconditioning that were felt to be recent onset (was living w/ sister prior and was able to walk w/o any problems). Pt presenting w/ lethargy, confusion, not making sense. Pt was brought to ED from HD by paramedics. He was just dc'd 2 days ago and this was his 1st OP HD. Pt was somnolent and didn't feel well. No n/v/d no fevers or abd pain. Pt seen by CCM who dx'd undifferentiated shock, felt possibly to be medication related, as pt was just started on these medications (cardizem, coreg) for afib and BP control, at the time starting dialysis and getting fluids removed. They did infectious work-up and started IV abx and pt was admitted to ICU. We are asked to see for ESRD.    Pt seen in room.  He is lethargic, awakens and answers simple questions, knows he is "back at the hospital" and that the year is "2023".  No abd pain or CP.  ROS - denies CP, no joint pain, no HA, no blurry vision, no rash, no diarrhea, no nausea/ vomiting   Past Medical History  Past Medical History:  Diagnosis Date   ESRD on hemodialysis ([place])    Hypertension    Past Surgical History  Past Surgical History:  Procedure Laterality Date   AV FISTULA PLACEMENT Left 01/28/2022   Procedure: ARTERIOVENOUS (AV) FISTULA CREATION VS.GRAFT ARM;  Surgeon: Angelia Mould, MD;  Location: [facility];  Service: Vascular;  Laterality: Left;   IR FLUORO GUIDE CV LINE RIGHT  01/24/2022   IR US GUIDE VASC ACCESS RIGHT  01/24/2022   Family History History reviewed. No pertinent family history. Social History  reports that he has been smoking  cigarettes. He does not have any smokeless tobacco history on file. No history on file for alcohol use and drug use. Allergies No Known Allergies Home medications Prior to Admission medications   Medication Sig Start Date End Date Taking? Authorizing Provider  apixaban (ELIQUIS) 5 MG TABS tablet Take 1 tablet (5 mg total) by mouth 2 (two) times daily. 01/31/22   Shelly Coss, MD  atorvastatin (LIPITOR) 40 MG tablet Take 1 tablet (40 mg total) by mouth daily. Patient not taking: Reported on 01/23/2022 03/12/21 04/11/21  Dorethea Clan, DO  calcitRIOL (ROCALTROL) 0.25 MCG capsule Take 1 capsule (0.25 mcg total) by mouth every Monday, Wednesday, and Friday. 02/01/22   Shelly Coss, MD  calcium acetate (PHOSLO) 667 MG capsule Take 2 capsules (1,334 mg total) by mouth 3 (three) times daily with meals. 01/31/22   Shelly Coss, MD  carvedilol (COREG) 25 MG tablet Take 1 tablet (25 mg total) by mouth 2 (two) times daily with a meal. 01/31/22   Shelly Coss, MD  diltiazem (CARDIZEM CD) 180 MG 24 hr capsule Take 1 capsule (180 mg total) by mouth daily. 02/01/22   Shelly Coss, MD  docusate sodium (COLACE) 100 MG capsule Take 1 capsule (100 mg total) by mouth 2 (two) times daily. 01/31/22   Shelly Coss, MD  polyethylene glycol (MIRALAX / GLYCOLAX) 17 g packet Take 17 g by mouth daily as needed  for moderate constipation. 01/31/22   Shelly Coss, MD     Vitals:   02/02/22 2000 02/02/22 2030 02/02/22 2045 02/02/22 2100  BP: 119/67 109/68  119/76  Pulse: 75 71 70 71  Resp: (!) 6 (!) 0 11 15  Temp:      TempSrc:      SpO2: (!) 88% 91% 100% 99%   Exam Gen alert, lethargic, arousable, Ox 3 No rash, cyanosis or gangrene Sclera anicteric, throat clear  No jvd or bruits Chest clear bilat to bases, no rales/ wheezing RRR no MRG Abd soft ntnd no mass or ascites +bs GU normal male MS no joint effusions or deformity Ext 1+ pretib and 2+ bilat pedal edema Neuro is as above    LUA AVF+ bruit,  2+ L forearm focal edema   Home meds include - apixaban, atorvastatin, calcitriol, calcium acetate, carvedilol 25 bid, diltiazem 180 qd, docusate, miralax, prns   OP HD: [facility] [place] (has not started there yet, just dc'd on 6/15)   Assessment/ Plan: Shock - per CCM, suspected possibly d/t new BP / afib medications. All BP lowering meds were held on admission. BP's today are improved. No fever, bcx's pending.  AMS - pt lethargic but mostly oriented. Likely due to #1, doubt uremia.  ESRD - recent start on 6/08. Last HD here as inpt on 6/14. Missed OP HD 6/17. Plan HD today, gentle UF 1.5- 2 L as tolerated.  Volume - vol up w/ bilat pedal edema and pleural effusions on cxr. BP's have improved. Will plan HD today for vol excess and missed HD yest.  Atrial fib - relatively controlled, HR 70's, holding BB/ diltiazem.  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 Tsat 18%, will add IV Fe load 500 mg and cont darbe 150 q Thursday.  MBD ckd - CCa and phos are in range. Resume phoslo and lower to 1 ac tid.   Vasc access - using TDC, also L 1st stage BVT AVF done 6/12 by Dr Scot Dock.       Rob Vani Gunner  MD 02/03/2022, 7:30 AM  Recent Labs  Lab 01/28/22 0436 01/31/22 0346 02/02/22 1801 02/02/22 1802 02/02/22 1825  HGB 7.4*   < > 9.2* 8.8*  --   ALBUMIN  --   --   --   --  2.0*  CALCIUM 8.4*  --   --   --  8.3*  PHOS  --   --   --   --  3.8  CREATININE 15.36*  --  5.80*  --  6.01*  K 4.1  --  3.7 3.8 4.8   < > = values in this interval not displayed.

## 2022-02-02 NOTE — ED Notes (Signed)
[phone] Sister Aydeen Blume [phone] Daughter Violet Baldy

## 2022-02-02 NOTE — Progress Notes (Signed)
Pharmacy Antibiotic Note  Tanner Rollins is a 71 y.o. male for which pharmacy has been consulted for vancomycin and cefepime dosing for sepsis.  Patient with a history of HTN, ESRD on HD, AF on eliquis. Patient presenting via EMS with hypotension, coming from HD center.  WBC 8.6; T 97.6; HR 102; RR 10  Plan: Vancomycin 1500mg  once - subsequent dosing per HD schedule Cefepime 1g q24hr Trend WBC, Fever, Renal function, & Clinical course F/u cultures, clinical course, WBC, fever De-escalate when able F/u Nephrology plan     Temp (24hrs), Avg:97.6 F (36.4 C), Min:97.6 F (36.4 C), Max:97.6 F (36.4 C)  Recent Labs  Lab 01/28/22 0436 01/31/22 0346 02/02/22 1712 02/02/22 1801  WBC 8.4 7.1 8.6  --   CREATININE 15.36*  --   --  5.80*    Estimated Creatinine Clearance: 12.6 mL/min (A) (by C-G formula based on SCr of 5.8 mg/dL (H)).    No Known Allergies  Antimicrobials this admission: vancomycin 6/17 >>  cefepime 6/17 >>  Microbiology results: Pending  Thank you for allowing pharmacy to be a part of this patient's care.  Lorelei Pont, PharmD, BCPS 02/02/2022 7:26 PM ED Clinical Pharmacist -  [phone]

## 2022-02-02 NOTE — Progress Notes (Signed)
eLink Physician-Brief Progress Note Patient Name: Tanner Rollins DOB: 10/27/1950 MRN: [id number]   Date of Service  02/02/2022  HPI/Events of Note  44M, hypertension, CKD, chronic anemia. Recent admit for hypertensive crisis, progression to needing HD, complicated by atrial fibrillation with RVR.  Discharged on carvedilol, diltiazem to SNF (new meds for him)   S/p scheduled outpatient HD 6/17, was hypotensive, tachycardic, somewhat lethargic. And sent to ED-> fluid resuscitated.   eICU Interventions  -hypotension suspected from diltiazem and carvedilol, plus new onset of HD/fluid shifts  -responded to volume resuscitation in the ED -has not required pressors  -HR stable 72 now, no need for Afib rate control- if needed will allow HR up to 110-120,  attempt Albumin for soft BP before trial of rate control, will try to avoid Cardizem if possible  -hold home BP meds  -Bilateral pleural effusions, mild pulmonary edema- cautious IVF -currently comfortable, will monitor         Tawney Vanorman N Antavius Sperbeck 02/02/2022, 10:32 PM

## 2022-02-03 DIAGNOSIS — Z992 Dependence on renal dialysis: Secondary | ICD-10-CM

## 2022-02-03 DIAGNOSIS — N186 End stage renal disease: Secondary | ICD-10-CM

## 2022-02-03 DIAGNOSIS — R5383 Other fatigue: Secondary | ICD-10-CM

## 2022-02-03 LAB — GLUCOSE, CAPILLARY
Glucose-Capillary: 74 mg/dL (ref 70–99)
Glucose-Capillary: 78 mg/dL (ref 70–99)
Glucose-Capillary: 103 mg/dL — ABNORMAL HIGH (ref 70–99)
Glucose-Capillary: 108 mg/dL — ABNORMAL HIGH (ref 70–99)

## 2022-02-03 LAB — LACTIC ACID, PLASMA: Lactic Acid, Venous: 1.7 mmol/L (ref 0.5–1.9)

## 2022-02-03 MED ORDER — HEPARIN SODIUM (PORCINE) 1000 UNIT/ML IJ SOLN
INTRAMUSCULAR | Status: AC
  Administered 2022-02-03: 2000 [IU]
  Filled 2022-02-03: qty 2

## 2022-02-03 MED ORDER — DARBEPOETIN ALFA 150 MCG/0.3ML IJ SOSY
150.0000 ug | PREFILLED_SYRINGE | INTRAMUSCULAR | Status: DC
  Administered 2022-02-07: 150 ug via INTRAVENOUS
  Filled 2022-02-03 (×2): qty 0.3

## 2022-02-03 MED ORDER — CHLORHEXIDINE GLUCONATE CLOTH 2 % EX PADS: 6.0000 | MEDICATED_PAD | Freq: Every day | CUTANEOUS | Status: DC

## 2022-02-03 MED ORDER — CALCIUM ACETATE (PHOS BINDER) 667 MG PO CAPS
667.0000 mg | ORAL_CAPSULE | Freq: Three times a day (TID) | ORAL | Status: DC
  Administered 2022-02-03 – 2022-02-06 (×6): 667 mg via ORAL
  Filled 2022-02-03 (×9): qty 1

## 2022-02-03 MED ORDER — NA FERRIC GLUC CPLX IN SUCROSE 12.5 MG/ML IV SOLN
250.0000 mg | Freq: Once | INTRAVENOUS | Status: DC
Start: 2022-02-03 — End: 2022-02-03

## 2022-02-03 MED ORDER — SODIUM CHLORIDE 0.9 % IV SOLN: 250.0000 mg | Freq: Once | INTRAVENOUS | Status: DC

## 2022-02-03 MED ORDER — HEPARIN SODIUM (PORCINE) 1000 UNIT/ML IJ SOLN
INTRAMUSCULAR | Status: AC
  Filled 2022-02-03: qty 2

## 2022-02-03 MED ORDER — SODIUM CHLORIDE 0.9 % IV SOLN
250.0000 mg | Freq: Once | INTRAVENOUS | Status: DC
  Filled 2022-02-03: qty 20

## 2022-02-03 NOTE — Progress Notes (Signed)
Received patient in bed, alert and oriented. Informed consent signed and in chart.  Time tx initiated:0947  Pre HD weight:78.6  Pre HD VS:see chart  Time tx completed:1323  HD treatment completed. Patient tolerated well. Fistula/Graft/HD catheter without signs and symptoms of complications. Patient transported back to the room, alert and orient and in no acute distress. Report given to bedside RN.  Total UF removed: 2.0 ml  Medication given:None  Post HD VS: see chart  Post HD weight: 75.3  Pt completed prescribed tx, Goal met, 2000 mls removed. BFR lowered from 400 to 350 with 1 hr of tx remaining.  Pt tolerated tx well, BP remained stable throughout entire tx. Pt departed unit, alert and oriented x4, stable with Tele monitoring.  RN escorted pt back to 97M per Unit nurse request.

## 2022-02-03 NOTE — Progress Notes (Signed)
Report given to Debbora Dus. RN in dialysis. Pt transported with RN on tele to dialysis lab.

## 2022-02-03 NOTE — Progress Notes (Signed)
NAME:  Tanner Rollins, MRN:  [id number], DOB:  05/15/1951, LOS: 1 ADMISSION DATE:  02/02/2022, CONSULTATION DATE:  6/17 REFERRING MD:  nanavati, CHIEF COMPLAINT:  hypotension    History of Present Illness:  This is a 71 year old male pt who was just discharged to SNF on 6/15 after being admitted for CP/HTn and Af w/ RVR. During this stay he was started on CCB, started HD and was eventually discharged to SNF w/ plans to cont rehab efforts and iHD. Presented to ER from HD center after on arrival to HD center from SNF found to be lethargic and hypotensive w/ SBP in 80s and HR 90s-130s. Was seen by EDP wbc ct nml, hgb actually up to 8.4 from 7.5 from time of D/c, cxr HD cath in place, bilateral effusion. Cultures sent, abx started. IVF bolus administered w/ some improvement on mental status w/ SBP up to mid-90s. PCCM asked to admit   Pertinent  Medical History  ESRD Poorly controlled hypertension Hypertensive emergency  Afib w/ RVR Prior respiratory failure Anemia iPTH Metabolic bone disease Significant Hospital Events: Including procedures, antibiotic start and stop dates in addition to other pertinent events   6/17 admitted w/ hypotension. Likely 2/2 CCB and BB. Cultures sent. Abx started. Responded some to IVFs Interim History / Subjective:  Never needed pressors 2 L/min nasal cannula Potassium stable 4.1, BUN 19, CO2 26 Lactate 2.3 > 1.7 Pct 3.44, WBC 8.9   Objective   Blood pressure 134/86, pulse 73, temperature 98.4 F (36.9 C), temperature source Oral, resp. rate 13, weight 73.4 kg, SpO2 97 %.        Intake/Output Summary (Last 24 hours) at 02/03/2022 0818 Last data filed at 02/03/2022 0200 Gross per 24 hour  Intake 301.86 ml  Output --  Net 301.86 ml   Filed Weights   02/02/22 2204 02/03/22 0426  Weight: 73.4 kg 73.4 kg    Examination: General: Thin elderly gentleman in no distress on 2 L/min HENT: Some temporal wasting, oropharynx clear, strong voice Lungs: Few  bibasilar inspiratory crackles Cardiovascular: Regular, NSR telemetry, 3/6 systolic murmur Abdomen: Nondistended, positive bowel sounds Extremities: Left upper extremity fistula good thrill Neuro: Awake, oriented, globally weak but nonfocal GU: Deferred   Resolved Hospital Problem list     Assessment & Plan:  Principal Problem:   Shock ([place]) Active Problems:   Anemia of renal disease   ESRD (end stage renal disease) on dialysis ([place])   A-fib (H[facility])   Metabolic bone disease   Pleural effusion   Lethargy  Undifferentiated shock At this point favor 2/2 CCB and BB.  Has received IVFs, cultures sent and pending Plan -Continue to hold carvedilol, diltiazem.  Likely add back the carvedilol next 24 hours if hemodynamically stable -Tailor empiric antibiotics 6/18, stop vancomycin.  May be able to stop cefepime on 6/19 if no evolving evidence for infection -May need midodrine going forward but he is usually hypertensive at baseline.  We will hold off for now  Lethargy  Responding to treatment of hypotension Plan -Continue supportive care -Continue to follow for any evidence of contributing infection  ESRD w/ metabolic bone disease no immediate need for HD Plan -Nephrology notified of his admission on 6/17 -HD as per nephrology plans, no urgent indication currently >> should be going today 6/18  Bilateral pleural effusion w/ mild pulmonary edema Plan -Follow chest x-ray -Careful with IV fluids in the setting of renal failure  Afib Plan -Telemetry -Eliquis -Diltiazem, carvedilol held currently.  Hopefully can avoid adding back the diltiazem if adequate rate control  Dispo: We will plan to transition out of the ICU 6/18, ask TRH to assume his care as of 6/19   Best Practice (right click and "Reselect all SmartList Selections" daily)   Diet/type: Regular consistency (see orders) DVT prophylaxis: DOAC GI prophylaxis: N/A Lines: Dialysis Catheter Foley:  N/A Code Status:   full code Last date of multidisciplinary goals of care discussion [pending]  Labs   CBC: Recent Labs  Lab 01/28/22 0436 01/31/22 0346 02/02/22 1712 02/02/22 1801 02/02/22 1802 02/02/22 2146 02/02/22 2258  WBC 8.4 7.1 8.6  --   --  9.2 8.9  NEUTROABS  --   --  6.0  --   --   --   --   HGB 7.4* 7.5* 8.4* 9.2* 8.8* 7.2* 7.1*  HCT 22.5* 23.0* 27.0* 27.0* 26.0* 23.4* 22.9*  MCV 85.9 89.1 92.8  --   --  94.0 91.6  PLT 217 256 288  --   --  250 260     Basic Metabolic Panel: Recent Labs  Lab 01/28/22 0436 02/02/22 1801 02/02/22 1802 02/02/22 1825 02/02/22 2146 02/02/22 2258  NA 134* 136 137 138  --  138  K 4.1 3.7 3.8 4.8  --  4.1  CL 96* 101  --  99  --  100  CO2 23  --   --  24  --  26  GLUCOSE 95 112*  --  107*  --  102*  BUN 86* 19  --  19  --  19  CREATININE 15.36* 5.80*  --  6.01* 6.12* 6.18*  CALCIUM 8.4*  --   --  8.3*  --  8.4*  MG  --   --   --  2.0  --  1.8  PHOS  --   --   --  3.8  --  4.7*    GFR: Estimated Creatinine Clearance: 11.7 mL/min (A) (by C-G formula based on SCr of 6.18 mg/dL (H)). Recent Labs  Lab 01/31/22 0346 02/02/22 1712 02/02/22 2146 02/02/22 2258 02/03/22 0127  PROCALCITON  --   --  3.44  --   --   WBC 7.1 8.6 9.2 8.9  --   LATICACIDVEN  --   --  2.3* 1.8 1.7     Liver Function Tests: Recent Labs  Lab 02/02/22 1825  AST 117*  ALT 33  ALKPHOS 89  BILITOT 1.1  PROT 5.4*  ALBUMIN 2.0*   No results for input(s): "LIPASE", "AMYLASE" in the last 168 hours. No results for input(s): "AMMONIA" in the last 168 hours.  ABG    Component Value Date/Time   PHART 7.393 01/24/2022 0611   PCO2ART 19.2 (LL) 01/24/2022 0611   PO2ART 60 (L) 01/24/2022 0611   HCO3 29.2 (H) 02/02/2022 1802   TCO2 30 02/02/2022 1802   ACIDBASEDEF 12.0 (H) 01/24/2022 0611   O2SAT 99 02/02/2022 1802     Coagulation Profile: Recent Labs  Lab 02/02/22 1712  INR 1.3*     Cardiac Enzymes: No results for input(s): "CKTOTAL", "CKMB", "CKMBINDEX",  "TROPONINI" in the last 168 hours.  HbA1C: Hgb A1c MFr Bld  Date/Time Value Ref Range Status  02/02/2022 09:46 PM 5.4 4.8 - 5.6 % Final    Comment:    (NOTE) Pre diabetes:          5.7%-6.4%  Diabetes:              >6.4%  Glycemic  control for   <7.0% adults with diabetes   03/10/2021 05:06 PM 5.5 4.8 - 5.6 % Final    Comment:    (NOTE)         Prediabetes: 5.7 - 6.4         Diabetes: >6.4         Glycemic control for adults with diabetes: <7.0     CBG: Recent Labs  Lab 02/02/22 1729 02/02/22 2214 02/03/22 0720  GLUCAP 130* 107* 74      Critical care time: NA     Baltazar Apo, MD, PhD 02/03/2022, 8:24 AM [facility] [phone] or if no answer before 7:00PM call [phone] For any issues after 7:00PM please call eLink [phone]

## 2022-02-03 NOTE — Procedures (Signed)
   I was present at this dialysis session, have reviewed the session itself and made  appropriate changes Kelly Splinter MD [facility] pager [phone]   02/03/2022, 10:54 AM

## 2022-02-04 DIAGNOSIS — E43 Unspecified severe protein-calorie malnutrition: Secondary | ICD-10-CM | POA: Insufficient documentation

## 2022-02-04 LAB — CBC
HCT: 22.4 % — ABNORMAL LOW (ref 39.0–52.0)
Hemoglobin: 7.1 g/dL — ABNORMAL LOW (ref 13.0–17.0)
MCH: 29.1 pg (ref 26.0–34.0)
MCHC: 31.7 g/dL (ref 30.0–36.0)
MCV: 91.8 fL (ref 80.0–100.0)
Platelets: 308 10*3/uL (ref 150–400)
RBC: 2.44 MIL/uL — ABNORMAL LOW (ref 4.22–5.81)
RDW: 15.6 % — ABNORMAL HIGH (ref 11.5–15.5)
WBC: 9.3 10*3/uL (ref 4.0–10.5)
nRBC: 0.5 % — ABNORMAL HIGH (ref 0.0–0.2)

## 2022-02-04 LAB — BASIC METABOLIC PANEL
Anion gap: 15 (ref 5–15)
BUN: 22 mg/dL (ref 8–23)
CO2: 27 mmol/L (ref 22–32)
Calcium: 8.4 mg/dL — ABNORMAL LOW (ref 8.9–10.3)
Chloride: 96 mmol/L — ABNORMAL LOW (ref 98–111)
Creatinine, Ser: 5.49 mg/dL — ABNORMAL HIGH (ref 0.61–1.24)
GFR, Estimated: 11 mL/min — ABNORMAL LOW (ref >60)
Glucose, Bld: 94 mg/dL (ref 70–99)
Potassium: 3.8 mmol/L (ref 3.5–5.1)
Sodium: 138 mmol/L (ref 135–145)

## 2022-02-04 LAB — PHOSPHORUS: Phosphorus: 4.2 mg/dL (ref 2.5–4.6)

## 2022-02-04 LAB — HEPATITIS B CORE ANTIBODY, TOTAL: Hep B Core Total Ab: NONREACTIVE

## 2022-02-04 LAB — MAGNESIUM: Magnesium: 1.5 mg/dL — ABNORMAL LOW (ref 1.7–2.4)

## 2022-02-04 LAB — GLUCOSE, CAPILLARY
Glucose-Capillary: 87 mg/dL (ref 70–99)
Glucose-Capillary: 112 mg/dL — ABNORMAL HIGH (ref 70–99)
Glucose-Capillary: 105 mg/dL — ABNORMAL HIGH (ref 70–99)
Glucose-Capillary: 133 mg/dL — ABNORMAL HIGH (ref 70–99)

## 2022-02-04 LAB — HEPATITIS B SURFACE ANTIBODY,QUALITATIVE: Hep B S Ab: NONREACTIVE

## 2022-02-04 LAB — HEPATITIS B SURFACE ANTIGEN: Hepatitis B Surface Ag: NONREACTIVE

## 2022-02-04 LAB — HEPATITIS C ANTIBODY: HCV Ab: NONREACTIVE

## 2022-02-04 MED ORDER — MAGNESIUM SULFATE 2 GM/50ML IV SOLN
2.0000 g | Freq: Once | INTRAVENOUS | Status: AC
  Administered 2022-02-04: 2 g via INTRAVENOUS
  Filled 2022-02-04: qty 50

## 2022-02-04 MED ORDER — ATORVASTATIN CALCIUM 40 MG PO TABS
40.0000 mg | ORAL_TABLET | Freq: Every day | ORAL | Status: DC
  Administered 2022-02-04 – 2022-02-12 (×9): 40 mg via ORAL
  Filled 2022-02-04 (×9): qty 1

## 2022-02-04 MED ORDER — CHLORHEXIDINE GLUCONATE CLOTH 2 % EX PADS
6.0000 | MEDICATED_PAD | Freq: Every day | CUTANEOUS | Status: DC
  Administered 2022-02-05 – 2022-02-10 (×6): 6 via TOPICAL

## 2022-02-04 MED ORDER — RENA-VITE PO TABS
1.0000 | ORAL_TABLET | Freq: Every day | ORAL | Status: DC
  Administered 2022-02-04 – 2022-02-12 (×9): 1 via ORAL
  Filled 2022-02-04 (×9): qty 1

## 2022-02-04 MED ORDER — ENSURE ENLIVE PO LIQD: 237.0000 mL | Freq: Three times a day (TID) | ORAL | Status: DC

## 2022-02-04 MED ORDER — ACETAMINOPHEN 325 MG PO TABS
650.0000 mg | ORAL_TABLET | Freq: Four times a day (QID) | ORAL | Status: DC | PRN
  Administered 2022-02-04 – 2022-02-12 (×10): 650 mg via ORAL
  Filled 2022-02-04 (×10): qty 2

## 2022-02-04 MED ORDER — SODIUM CHLORIDE 0.9 % IV SOLN
250.0000 mg | INTRAVENOUS | Status: AC
  Administered 2022-02-05 – 2022-02-07 (×2): 250 mg via INTRAVENOUS
  Filled 2022-02-04 (×2): qty 20

## 2022-02-04 NOTE — Progress Notes (Signed)
Pt transferred to 5M17

## 2022-02-04 NOTE — Progress Notes (Addendum)
Initial Nutrition Assessment  DOCUMENTATION CODES:   Severe malnutrition in context of chronic illness  INTERVENTION:   Ensure Enlive po TID, each supplement provides 350 kcal and 20 grams of protein.  Renal MVI daily.  Liberalize diet to Regular with 1200 ml fluid restriction.  NUTRITION DIAGNOSIS:   Severe Malnutrition related to chronic illness (ESRD) as evidenced by severe muscle depletion, severe fat depletion.  GOAL:   Patient will meet greater than or equal to 90% of their needs  MONITOR:   PO intake, Supplement acceptance, Labs, Skin  REASON FOR ASSESSMENT:   Malnutrition Screening Tool    ASSESSMENT:   71 yo male admitted from dialysis with lethargy, hypotension, tachycardia, shock. Recent admission (d/c 6/15) for A fib with RVR. Required initiation of HD that admission. PMH includes HTN, ESRD on HD.  Patient states that he has been eating okay. He has lost weight recently. He usually weighs in the 160's-170's. He is new to HD. Has only received a few treatments. He is unsure of his usual medications PTA. No recent weight hx available for review. Suspect weight loss is related to loss of muscle mass as well as fluid losses after starting HD. He likes chocolate Ensure supplements; will continue. Potassium and phosphorus are WNL.   Currently on a renal diet. Meal intakes documented at 50-100%  Labs reviewed. Ionized calcium 1.03 (L), mag 1.5 (L) CBG: 87-112  Medications reviewed and include calcitriol, Phoslo, Aranesp, Colace, Novolog, ferric gluconate.  Patient meets criteria for severe malnutrition, given severe depletion of muscle and subcutaneous fat mass.  NUTRITION - FOCUSED PHYSICAL EXAM:  Flowsheet Row Most Recent Value  Orbital Region Severe depletion  Upper Arm Region Moderate depletion  Thoracic and Lumbar Region Moderate depletion  Buccal Region Severe depletion  Temple Region Severe depletion  Clavicle Bone Region Severe depletion  Clavicle  and Acromion Bone Region Severe depletion  Scapular Bone Region Severe depletion  Dorsal Hand Severe depletion  Patellar Region Severe depletion  Anterior Thigh Region Severe depletion  Posterior Calf Region Severe depletion  Edema (RD Assessment) Mild  Hair Reviewed  Eyes Reviewed  Mouth Reviewed  Skin Reviewed  Nails Reviewed       Diet Order:   Diet Order             Diet renal with fluid restriction Fluid restriction: 1200 mL Fluid; Room service appropriate? Yes; Fluid consistency: Thin  Diet effective now                   EDUCATION NEEDS:   No education needs have been identified at this time  Skin:  Skin Assessment: Reviewed RN Assessment  Last BM:  6/16  Height:   Ht Readings from Last 1 Encounters:  02/04/22 5' 11.5" (1.816 m)    Weight:   Wt Readings from Last 1 Encounters:  02/04/22 73.2 kg    Ideal Body Weight:     BMI:  Body mass index is 22.19 kg/m.  Estimated Nutritional Needs:   Kcal:  2200-2400  Protein:  100-115 gm  Fluid:  1 L + UOP    Lucas Mallow RD, LDN, CNSC Please refer to Amion for contact information.

## 2022-02-04 NOTE — Progress Notes (Signed)
Pt receives out-pt HD at [facility] on TTS. Pt has a 10:55 chair time. Pt had first treatment out-pt on Saturday. Pt treated 4 hrs and 10 minutes per clinic. Pt was sent to ED from clinic. Clinic staff attempted multiple times to reach snf to advise them that pt was sent to ED. There was no answer at snf when clinic called. Clinic also advised snf transportation driver that pt was send to ED from clinic. Above info provided to nephrologist. Will assist as needed.   Melven Sartorius Renal Navigator [phone]

## 2022-02-04 NOTE — Progress Notes (Signed)
[facility] Progress Note  Name: Tanner Rollins MRN: [id number] DOB: 06-30-1951  Chief Complaint:  Altered mental status   Subjective:   patient with last HD on 6/18 and was near even (net neg UF 78 mL).  He states he got outpatient HD on Sat - per charting here HD unit called and said he hadn't been dialyzed there.  He had presented there and had hypotension, ams.   Review of systems:  Denies shortness of breath or chest pain. Doesn't wear oxygen at home  Denies n/v --------------  Background on Consult: ESRD pt w/ shock HPI: The patient is a 71 y.o. year-old w/ hx of HTN and ESRD on HD, recently started HD here during an admission for uncont HTN, afib and A/C renal faliure. Pt was here from 6/7- 6/15. Pt recommended SNF on dc due to debility/ deconditioning that were felt to be recent onset (was living w/ sister prior and was able to walk w/o any problems). Pt presenting w/ lethargy, confusion, not making sense. Pt was brought to ED from HD by paramedics. He was just dc'd 2 days ago and this was his 1st OP HD. Pt was somnolent and didn't feel well. No n/v/d no fevers or abd pain. Pt seen by CCM who dx'd undifferentiated shock, felt possibly to be medication related, as pt was just started on these medications (cardizem, coreg) for afib and BP control, at the time starting dialysis and getting fluids removed. They did infectious work-up and started IV abx and pt was admitted to ICU. We are asked to see for ESRD.     Pt seen in room.  He is lethargic, awakens and answers simple questions, knows he is "back at the hospital" and that the year is "2023".  No abd pain or CP.  Intake/Output Summary (Last 24 hours) at 02/04/2022 0618 Last data filed at 02/03/2022 1900 Gross per 24 hour  Intake 880 ml  Output -78.1 ml  Net 958.1 ml    Vitals:  Vitals:   02/04/22 0100 02/04/22 0200 02/04/22 0300 02/04/22 0400  BP: 130/86 134/83 133/90 135/86  Pulse: 82 82 82 85  Resp: 18 14 18  18   Temp:    98.2 F (36.8 C)  TempSrc:    Oral  SpO2: 99% 98% 100% 98%  Weight:         Physical Exam:  General adult male in bed in no acute distress HEENT normocephalic atraumatic extraocular movements intact sclera anicteric Neck supple trachea midline Lungs clear to auscultation bilaterally anteriorly; normal work of breathing at rest; on 2 liters  Heart S1S2 no rub Abdomen soft nontender nondistended Extremities trace edema bilaterally  Psych normal mood and affect Right IJ tunn catheter in place; LUE AVF bruit and thrill   Medications reviewed   Labs:     Latest Ref Rng & Units 02/04/2022    2:06 AM 02/02/2022   10:58 PM 02/02/2022    9:46 PM  BMP  Glucose 70 - 99 mg/dL 94  102    BUN 8 - 23 mg/dL 22  19    Creatinine 0.61 - 1.24 mg/dL 5.49  6.18  6.12   Sodium 135 - 145 mmol/L 138  138    Potassium 3.5 - 5.1 mmol/L 3.8  4.1    Chloride 98 - 111 mmol/L 96  100    CO2 22 - 32 mmol/L 27  26    Calcium 8.9 - 10.3 mg/dL 8.4  8.4  TTS [place] (has not started there yet, just dc'd on 6/15 after new start)  Assessment/Plan:   Undifferentiated Shock - per CCM, suspected possibly d/t new BP / afib medications. All BP lowering meds were held on admission.  Improved.   afebrile, blood cultures pending - NGTD but low volume samples    AMS - much improved it seems. Likely due to #1, doubt uremia.  ESRD - recent start on 6/08. Last HD here as inpt on 6/14. Missed OP HD 6/17. Has TDC, also L 1st stage BVT AVF done 6/12 by Dr Scot Dock. HD per TTS schedule  Volume - overload w/mild pedal edema and pleural effusions on cxr. BP's have improved.  Atrial fib - holding BB/ diltiazem per primary team  Anemia esrd - last received aranesp 150 mcg given here last admit on 6/14. Tsat 18% - on IV iron.  Continue aranesp 150 mcg every Thursday.  PRBC's per primary team  MBD ckd - hyperphos controlled on phoslo - was lowered to 1 with meals      Claudia Desanctis, MD 02/04/2022 6:35 AM

## 2022-02-04 NOTE — TOC Progression Note (Signed)
Transition of Care [facility]) - Initial/Assessment Note    Patient Details  Name: Tanner Rollins MRN: [id number] Date of Birth: [birth year]  Transition of Care [facility]) CM/SW Contact:    Milinda Antis, Bernie Phone Number: 02/04/2022, 2:05 PM  Clinical Narrative:                 Patient was ST at [facility] and can return when medically ready.  TOC will continue to follow.          Patient Goals and CMS Choice        Expected Discharge Plan and Services                                                Prior Living Arrangements/Services                       Activities of Daily Living Home Assistive Devices/Equipment: Wheelchair, Environmental consultant (specify type) ADL Screening (condition at time of admission) Patient's cognitive ability adequate to safely complete daily activities?: No Is the patient deaf or have difficulty hearing?: No Does the patient have difficulty seeing, even when wearing glasses/contacts?: No Does the patient have difficulty concentrating, remembering, or making decisions?: No Patient able to express need for assistance with ADLs?: Yes Does the patient have difficulty dressing or bathing?: Yes Independently performs ADLs?: No Communication: Independent Dressing (OT): Needs assistance Is this a change from baseline?: Pre-admission baseline Grooming: Needs assistance Is this a change from baseline?: Pre-admission baseline Feeding: Needs assistance Is this a change from baseline?: Pre-admission baseline Bathing: Needs assistance Is this a change from baseline?: Pre-admission baseline Toileting: Needs assistance Is this a change from baseline?: Pre-admission baseline In/Out Bed: Needs assistance Is this a change from baseline?: Pre-admission baseline Walks in Home: Needs assistance Is this a change from baseline?: Pre-admission baseline Does the patient have difficulty walking or climbing stairs?: Yes Weakness of Legs: Both Weakness of  Arms/Hands: Both  Permission Sought/Granted                  Emotional Assessment              Admission diagnosis:  Somnolence [R40.0] Shock ([place]) [R57.9] Hypotension due to medication [I95.2] Patient Active Problem List   Diagnosis Date Noted   Shock ([place]) 02/02/2022   ESRD (end stage renal disease) on dialysis ([place]) 02/02/2022   A-fib ([place]) 83/38/2505   Metabolic bone disease 39/76/7341   Pleural effusion 02/02/2022   Lethargy 02/02/2022   High anion gap metabolic acidosis    Hypertensive emergency 01/23/2022   Chest pain    AKI (acute kidney injury) ([place])    CKD (chronic kidney disease) stage 4, GFR 15-29 ml/min ([place]) 03/12/2021   Enlarged prostate without urinary obstruction 03/12/2021   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   Severe hypertension 03/11/2021   PCP:  Pcp, No Pharmacy:  No Pharmacies Listed    Social Determinants of Health (SDOH) Interventions    Readmission Risk Interventions     No data to display

## 2022-02-04 NOTE — Progress Notes (Signed)
PROGRESS NOTE    Tanner Rollins  DHR:[id number] DOB: 1951-04-16 DOA: 02/02/2022 PCP: Pcp, No   Brief Narrative:  This is a 71 year old male pt who was just discharged to SNF on 6/15 after being admitted for CP/HTn and Af w/ RVR. During this stay he was started on CCB, started HD and was eventually discharged to SNF w/ plans to cont rehab efforts and iHD.  Patient presented to hospital from dialysis with lethargy hypotension and tachycardia.  Admitted transiently to the ICU for hypotension given concern for need for pressors given end-stage renal dysfunction and dialysis and would be unable to tolerate massive volume resuscitation.  6/19 -blood pressure drastically improving without needing pressors, transitioned to hospitalist service  Assessment & Plan:   Principal Problem:   Shock ([place]) Active Problems:   Lethargy   Anemia of renal disease   ESRD (end stage renal disease) on dialysis ()   A-fib ([place])   Metabolic bone disease   Pleural effusion  Septic shock complicated by polypharmacy Symptomatic hypotension and bradycardia - Presumed sepsis secondary to likely pneumonia given tachycardia and tachypnea with elevated procalcitonin imaging and symptoms  -Complicated by polypharmacy including calcium channel blocker and beta-blocker which likely limited patient's ability to respond appropriately -Previously received IV fluids, transition back to increased p.o. intake, hold further IV fluids in the setting of dialysis -Continue antibiotics for 5 days with cefepime, follow clinically   Lethargy 2/2 metabolic versus toxic encephalopathy -Likely secondary to above -Mental status appears to be improving somewhat, unclear if he is back to baseline at this time but awake alert oriented x4   ESRD w/ metabolic bone disease  -Nephrology following, appreciate insight and recommendations -Continue dialysis per nephrology last dialysis the 18th   Bilateral pleural effusion w/ mild  pulmonary edema Plan -Follow chest x-ray -Careful with IV fluids in the setting of renal failure   Afib Rate controlled, continue Eliquis Home diltiazem and carvedilol held at intake -can resume once blood pressure and heart rate normalize   DVT prophylaxis: Eliquis Code Status: Full Family Communication: None present  Status is: Inpatient  Dispo: The patient is from: Facility              Anticipated d/c is to: Same              Anticipated d/c date is: 24 to 48 hours pending clinical course              Patient currently not medically stable for discharge  Consultants:  PCCM, nephrology  Procedures:  None  Antimicrobials:  Cefepime x5 days(vancomycin previously discontinued)  Subjective: No acute issues or events overnight, much more awake alert oriented this morning than previously noted.  Remains weak fatigued but denies nausea vomiting diarrhea constipation headache fevers chills or chest pain.  Objective: Vitals:   02/04/22 0200 02/04/22 0300 02/04/22 0400 02/04/22 0743  BP: 134/83 133/90 135/86   Pulse: 82 82 85   Resp: 14 18 18    Temp:   98.2 F (36.8 C) 98.7 F (37.1 C)  TempSrc:   Oral Oral  SpO2: 98% 100% 98%   Weight:        Intake/Output Summary (Last 24 hours) at 02/04/2022 0752 Last data filed at 02/03/2022 1900 Gross per 24 hour  Intake 880 ml  Output -78.1 ml  Net 958.1 ml   Filed Weights   02/03/22 0426 02/03/22 0931 02/03/22 1338  Weight: 73.4 kg 78.6 kg 75.3 kg    Examination:  General:  Pleasantly resting in bed, No acute distress. HEENT:  Normocephalic atraumatic.  Sclerae nonicteric, noninjected.  Extraocular movements intact bilaterally. Neck:  Without mass or deformity.  Trachea is midline. Lungs:  Clear to auscultate bilaterally without rhonchi, wheeze, or rales. Heart:  Regular rate and rhythm.  Without murmurs, rubs, or gallops. Abdomen:  Soft, nontender, nondistended.  Without guarding or rebound. Extremities: Without  cyanosis, clubbing, edema, or obvious deformity. Vascular:  Dorsalis pedis and posterior tibial pulses palpable bilaterally. Skin:  Warm and dry, no erythema, no ulcerations.  Data Reviewed: I have personally reviewed following labs and imaging studies  CBC: Recent Labs  Lab 01/31/22 0346 02/02/22 1712 02/02/22 1801 02/02/22 1802 02/02/22 2146 02/02/22 2258 02/04/22 0206  WBC 7.1 8.6  --   --  9.2 8.9 9.3  NEUTROABS  --  6.0  --   --   --   --   --   HGB 7.5* 8.4* 9.2* 8.8* 7.2* 7.1* 7.1*  HCT 23.0* 27.0* 27.0* 26.0* 23.4* 22.9* 22.4*  MCV 89.1 92.8  --   --  94.0 91.6 91.8  PLT 256 288  --   --  250 260 888   Basic Metabolic Panel: Recent Labs  Lab 02/02/22 1801 02/02/22 1802 02/02/22 1825 02/02/22 2146 02/02/22 2258 02/04/22 0206  NA 136 137 138  --  138 138  K 3.7 3.8 4.8  --  4.1 3.8  CL 101  --  99  --  100 96*  CO2  --   --  24  --  26 27  GLUCOSE 112*  --  107*  --  102* 94  BUN 19  --  19  --  19 22  CREATININE 5.80*  --  6.01* 6.12* 6.18* 5.49*  CALCIUM  --   --  8.3*  --  8.4* 8.4*  MG  --   --  2.0  --  1.8 1.5*  PHOS  --   --  3.8  --  4.7* 4.2   GFR: Estimated Creatinine Clearance: 13.5 mL/min (A) (by C-G formula based on SCr of 5.49 mg/dL (H)). Liver Function Tests: Recent Labs  Lab 02/02/22 1825  AST 117*  ALT 33  ALKPHOS 89  BILITOT 1.1  PROT 5.4*  ALBUMIN 2.0*   No results for input(s): "LIPASE", "AMYLASE" in the last 168 hours. No results for input(s): "AMMONIA" in the last 168 hours. Coagulation Profile: Recent Labs  Lab 02/02/22 1712  INR 1.3*   Cardiac Enzymes: No results for input(s): "CKTOTAL", "CKMB", "CKMBINDEX", "TROPONINI" in the last 168 hours. BNP (last 3 results) No results for input(s): "PROBNP" in the last 8760 hours. HbA1C: Recent Labs    02/02/22 2146  HGBA1C 5.4   CBG: Recent Labs  Lab 02/03/22 0720 02/03/22 1430 02/03/22 1548 02/03/22 2106 02/04/22 0742  GLUCAP 74 78 103* 108* 87   Lipid  Profile: No results for input(s): "CHOL", "HDL", "LDLCALC", "TRIG", "CHOLHDL", "LDLDIRECT" in the last 72 hours. Thyroid Function Tests: Recent Labs    02/02/22 2258  TSH 1.790   Anemia Panel: No results for input(s): "VITAMINB12", "FOLATE", "FERRITIN", "TIBC", "IRON", "RETICCTPCT" in the last 72 hours. Sepsis Labs: Recent Labs  Lab 02/02/22 2146 02/02/22 2258 02/03/22 0127  PROCALCITON 3.44  --   --   LATICACIDVEN 2.3* 1.8 1.7    Recent Results (from the past 240 hour(s))  MRSA Next Gen by PCR, Nasal     Status: None   Collection Time: 01/25/22 11:01 AM  Specimen: Nasal Mucosa; Nasal Swab  Result Value Ref Range Status   MRSA by PCR Next Gen NOT DETECTED NOT DETECTED Final    Comment: (NOTE) The GeneXpert MRSA Assay (FDA approved for NASAL specimens only), is one component of a comprehensive MRSA colonization surveillance program. It is not intended to diagnose MRSA infection nor to guide or monitor treatment for MRSA infections. Test performance is not FDA approved in patients less than 66 years old. Performed at [facility], [place] [address] [place], [place] [address]   Surgical PCR screen     Status: None   Collection Time: 01/27/22  8:56 PM   Specimen: Nasal Mucosa; Nasal Swab  Result Value Ref Range Status   MRSA, PCR NEGATIVE NEGATIVE Final   Staphylococcus aureus NEGATIVE NEGATIVE Final    Comment: (NOTE) The Xpert SA Assay (FDA approved for NASAL specimens in patients 37 years of age and older), is one component of a comprehensive surveillance program. It is not intended to diagnose infection nor to guide or monitor treatment. Performed at [facility], [place] [address] [place], [place] [address]   Blood Culture (routine x 2)     Status: None (Preliminary result)   Collection Time: 02/02/22  5:12 PM   Specimen: BLOOD  Result Value Ref Range Status   Specimen Description BLOOD RIGHT ANTECUBITAL  Final   Special Requests   Final    BOTTLES  DRAWN AEROBIC AND ANAEROBIC Blood Culture results may not be optimal due to an inadequate volume of blood received in culture bottles   Culture   Final    NO GROWTH < 24 HOURS Performed at [facility], [place] [address], [place], [place] [address]    Report Status PENDING  Incomplete  Blood Culture (routine x 2)     Status: None (Preliminary result)   Collection Time: 02/02/22  5:17 PM   Specimen: BLOOD RIGHT FOREARM  Result Value Ref Range Status   Specimen Description BLOOD RIGHT FOREARM  Final   Special Requests   Final    BOTTLES DRAWN AEROBIC AND ANAEROBIC Blood Culture results may not be optimal due to an inadequate volume of blood received in culture bottles   Culture   Final    NO GROWTH < 24 HOURS Performed at [facility], [place] [address], [place], [place] [address]    Report Status PENDING  Incomplete  MRSA Next Gen by PCR, Nasal     Status: None   Collection Time: 02/02/22  9:21 PM   Specimen: Nasal Mucosa; Nasal Swab  Result Value Ref Range Status   MRSA by PCR Next Gen NOT DETECTED NOT DETECTED Final    Comment: (NOTE) The GeneXpert MRSA Assay (FDA approved for NASAL specimens only), is one component of a comprehensive MRSA colonization surveillance program. It is not intended to diagnose MRSA infection nor to guide or monitor treatment for MRSA infections. Test performance is not FDA approved in patients less than 60 years old. Performed at [facility], [place] [address], [place], [place] [address]          Radiology Studies: DG Chest Port 1 View  Result Date: 02/02/2022 CLINICAL DATA:  Sepsis. EXAM: PORTABLE CHEST 1 VIEW COMPARISON:  01/30/2022 and prior studies FINDINGS: Enlargement of the cardiopericardial silhouette and RIGHT IJ central venous catheter with tip overlying the SUPERIOR cavoatrial junction again noted. Pulmonary vascular congestion again noted. Bilateral LOWER lung opacities again noted, LEFT-greater-than-RIGHT. Bilateral pleural  effusions are again identified. There is no evidence of pneumothorax. IMPRESSION: Unchanged chest radiograph with cardiomegaly, pulmonary vascular congestion, and bilateral LOWER lung opacities/pleural effusions. Electronically Signed   By: Margarette Canada M.D.   On: 02/02/2022 17:32        Scheduled Meds:  apixaban  5 mg Oral BID   calcitRIOL  0.25 mcg Oral Q M,W,F   calcium acetate  667 mg Oral TID WC   Chlorhexidine Gluconate Cloth  6 each Topical Q0600   Chlorhexidine Gluconate Cloth  6 each Topical Q0600   [START ON 02/07/2022] darbepoetin (ARANESP) injection - DIALYSIS  150 mcg Intravenous Q Thu-HD   docusate sodium  100 mg Oral BID   feeding supplement  237 mL Oral BID BM   insulin aspart  0-5 Units Subcutaneous QHS   insulin aspart  0-6 Units Subcutaneous TID WC   Continuous Infusions:  sodium chloride Stopped (02/02/22 2322)   ceFEPime (MAXIPIME) IV 1 g (02/03/22 1924)   ferric gluconate (FERRLECIT) IVPB     [START ON 02/05/2022] ferric gluconate (FERRLECIT) IVPB     magnesium sulfate bolus IVPB 2 g (02/04/22 0747)    LOS: 2 days   Time spent: 43min  Tanner Tien C Sharunda Salmon, DO [facility]  If 7PM-7AM, please contact night-coverage [email or web address]  02/04/2022, 7:52 AM

## 2022-02-05 LAB — BASIC METABOLIC PANEL
Anion gap: 13 (ref 5–15)
BUN: 33 mg/dL — ABNORMAL HIGH (ref 8–23)
CO2: 27 mmol/L (ref 22–32)
Calcium: 8.6 mg/dL — ABNORMAL LOW (ref 8.9–10.3)
Chloride: 95 mmol/L — ABNORMAL LOW (ref 98–111)
Creatinine, Ser: 8.3 mg/dL — ABNORMAL HIGH (ref 0.61–1.24)
GFR, Estimated: 6 mL/min — ABNORMAL LOW (ref >60)
Glucose, Bld: 108 mg/dL — ABNORMAL HIGH (ref 70–99)
Potassium: 4.2 mmol/L (ref 3.5–5.1)
Sodium: 135 mmol/L (ref 135–145)

## 2022-02-05 LAB — CBC
HCT: 20.4 % — ABNORMAL LOW (ref 39.0–52.0)
Hemoglobin: 6.4 g/dL — CL (ref 13.0–17.0)
MCH: 28.7 pg (ref 26.0–34.0)
MCHC: 31.4 g/dL (ref 30.0–36.0)
MCV: 91.5 fL (ref 80.0–100.0)
Platelets: 278 10*3/uL (ref 150–400)
RBC: 2.23 MIL/uL — ABNORMAL LOW (ref 4.22–5.81)
RDW: 15.9 % — ABNORMAL HIGH (ref 11.5–15.5)
WBC: 9.7 10*3/uL (ref 4.0–10.5)
nRBC: 0.3 % — ABNORMAL HIGH (ref 0.0–0.2)

## 2022-02-05 LAB — GLUCOSE, CAPILLARY
Glucose-Capillary: 120 mg/dL — ABNORMAL HIGH (ref 70–99)
Glucose-Capillary: 92 mg/dL (ref 70–99)
Glucose-Capillary: 127 mg/dL — ABNORMAL HIGH (ref 70–99)
Glucose-Capillary: 119 mg/dL — ABNORMAL HIGH (ref 70–99)

## 2022-02-05 LAB — HEPATITIS B SURFACE ANTIBODY, QUANTITATIVE: Hep B S AB Quant (Post): <3.1 m[IU]/mL — ABNORMAL LOW (ref >9.9)

## 2022-02-05 LAB — PREPARE RBC (CROSSMATCH)

## 2022-02-05 MED ORDER — SODIUM CHLORIDE 0.9% IV SOLUTION: Freq: Once | INTRAVENOUS | Status: DC

## 2022-02-05 MED ORDER — NEPRO/CARBSTEADY PO LIQD
237.0000 mL | Freq: Three times a day (TID) | ORAL | Status: DC
  Administered 2022-02-05 – 2022-02-12 (×9): 237 mL via ORAL

## 2022-02-05 MED ORDER — HEPARIN SODIUM (PORCINE) 1000 UNIT/ML IJ SOLN
INTRAMUSCULAR | Status: AC
  Filled 2022-02-05: qty 4

## 2022-02-05 MED ORDER — PROSOURCE PLUS PO LIQD
30.0000 mL | Freq: Two times a day (BID) | ORAL | Status: DC
  Administered 2022-02-05 – 2022-02-09 (×6): 30 mL via ORAL
  Filled 2022-02-05 (×10): qty 30

## 2022-02-05 MED ORDER — CALCITRIOL 0.5 MCG PO CAPS
0.5000 ug | ORAL_CAPSULE | ORAL | Status: DC
Start: 2022-02-06 — End: 2022-02-05

## 2022-02-05 MED ORDER — CALCITRIOL 0.25 MCG PO CAPS
0.2500 ug | ORAL_CAPSULE | ORAL | Status: DC
  Administered 2022-02-05 – 2022-02-12 (×5): 0.25 ug via ORAL
  Filled 2022-02-05 (×4): qty 1

## 2022-02-05 NOTE — Progress Notes (Signed)
Hemodialysis- Completed without issue. 1.2L removed. GIven 2 units prbcs as ordered as well as iv iron. Reported off to 62M.

## 2022-02-05 NOTE — Procedures (Signed)
Seen and examined on dialysis.  Procedure supervised.  Blood pressure 101/79 and HR 86.  RIJ tunn catheter in use.  Tolerating goal.  Discussed with RN to lower if BP drops further - just at 1kg now.  He is getting 2 units of PRBC's with HD today - blood should be here shortly per RN  Claudia Desanctis, MD 02/05/2022  8:40 AM

## 2022-02-05 NOTE — Progress Notes (Signed)
   Critical Value: hemoglobin 6.4 g/dL  Name of Provider Notified: MD Howerter  Orders Received? Or Actions Taken?: awaiting response

## 2022-02-05 NOTE — Progress Notes (Addendum)
PROGRESS NOTE    Tanner Rollins  QQP:[id number] DOB: Aug 09, 1951 DOA: 02/02/2022 PCP: Pcp, No   Brief Narrative:  This is a 71 year old male pt who was just discharged to SNF on 6/15 after being admitted for CP/HTn and Af w/ RVR. During this stay he was started on CCB, started HD and was eventually discharged to SNF w/ plans to cont rehab efforts and iHD.  Patient presented to hospital from dialysis with lethargy hypotension and tachycardia.  Admitted transiently to the ICU for hypotension given concern for need for pressors given end-stage renal dysfunction and dialysis and would be unable to tolerate massive volume resuscitation.  6/19 - blood pressure drastically improving without needing pressors, transitioned to hospitalist service 6/20 -moderate anemia, 6.4 requiring transfusion with dialysis, hypotension resolving  Assessment & Plan:   Principal Problem:   Shock ([place]) Active Problems:   Lethargy   Anemia of renal disease   ESRD (end stage renal disease) on dialysis ([facility])   A-fib ([facility])   Metabolic bone disease   Pleural effusion   Protein-calorie malnutrition, severe  Septic shock complicated by polypharmacy Symptomatic hypotension and bradycardia Complicated anemia, unspecified acute on chronic anemia of chronic disease - Presumed sepsis secondary to likely pneumonia given tachycardia and tachypnea with elevated procalcitonin imaging and symptoms  -Complicated by polypharmacy including calcium channel blocker and beta-blocker which likely limited patient's ability to respond appropriately -Previously received IV fluids, transition back to increased p.o. intake, hold further IV fluids in the setting of dialysis -Continue antibiotics for 5 days with cefepime, follow clinically -Transfuse 2 unit PRBC 02/05/2022   Lethargy 2/2 metabolic versus toxic encephalopathy -Likely secondary to above -Mental status appears to be improving somewhat, unclear if he is back to baseline at  this time but awake alert oriented x4   ESRD w/ metabolic bone disease  -Nephrology following, appreciate insight and recommendations -Continue dialysis per nephrology dialysis Tuesday Thursday Saturday -Trend anemia as above   Bilateral pleural effusion w/ mild pulmonary edema -Likely secondary to above with suspected pneumonia and volume overload -Continue dialysis as above -Careful with IV fluids in the setting of renal failure   Afib, permanent Rate controlled, continue Eliquis Home diltiazem and carvedilol held at intake -can resume once blood pressure and heart rate normalize   DVT prophylaxis: Eliquis Code Status: Full Family Communication: None present  Status is: Inpatient  Dispo: The patient is from: Facility              Anticipated d/c is to: Same              Anticipated d/c date is: 24 to 48 hours pending clinical course              Patient currently not medically stable for discharge  Consultants:  PCCM, nephrology  Procedures:  None  Antimicrobials:  Cefepime x5 days(vancomycin previously discontinued)  Subjective: No acute issues or events overnight, patient tolerating dialysis this morning receiving blood transfusion without issue or event.  Continues to feel less fatigued and weak than prior but still not back to baseline, quite somnolent but easily arousable.  Denies overt nausea vomiting diarrhea constipation headache fevers chills or chest pain  Objective: Vitals:   02/04/22 1040 02/04/22 1639 02/04/22 2100 02/05/22 0440  BP: 119/82 108/75 106/80 115/73  Pulse: 86 87 85 81  Resp: 18 18 18 18   Temp: 98.7 F (37.1 C) 99.2 F (37.3 C) 99 F (37.2 C) 98.4 F (36.9 C)  TempSrc: Oral  Oral Oral Oral  SpO2: 95% 100% 100% 100%  Weight: 73.2 kg   73.3 kg  Height: 5' 11.5" (1.816 m)       Intake/Output Summary (Last 24 hours) at 02/05/2022 0757 Last data filed at 02/04/2022 2200 Gross per 24 hour  Intake 580.07 ml  Output 0 ml  Net 580.07 ml     Filed Weights   02/03/22 1338 02/04/22 1040 02/05/22 0440  Weight: 75.3 kg 73.2 kg 73.3 kg    Examination:  General:  Pleasantly resting in bed, No acute distress. HEENT:  Normocephalic atraumatic.  Sclerae nonicteric, noninjected.  Extraocular movements intact bilaterally. Neck:  Without mass or deformity.  Trachea is midline. Lungs:  Clear to auscultate bilaterally without rhonchi, wheeze, or rales. Heart:  Regular rate and rhythm.  Without murmurs, rubs, or gallops. Abdomen:  Soft, nontender, nondistended.  Without guarding or rebound. Extremities: Without cyanosis, clubbing, edema, or obvious deformity. Vascular:  Dorsalis pedis and posterior tibial pulses palpable bilaterally. Skin:  Warm and dry, no erythema, no ulcerations.  Data Reviewed: I have personally reviewed following labs and imaging studies  CBC: Recent Labs  Lab 02/02/22 1712 02/02/22 1801 02/02/22 1802 02/02/22 2146 02/02/22 2258 02/04/22 0206 02/05/22 0353  WBC 8.6  --   --  9.2 8.9 9.3 9.7  NEUTROABS 6.0  --   --   --   --   --   --   HGB 8.4*   < > 8.8* 7.2* 7.1* 7.1* 6.4*  HCT 27.0*   < > 26.0* 23.4* 22.9* 22.4* 20.4*  MCV 92.8  --   --  94.0 91.6 91.8 91.5  PLT 288  --   --  250 260 308 278   < > = values in this interval not displayed.    Basic Metabolic Panel: Recent Labs  Lab 02/02/22 1801 02/02/22 1802 02/02/22 1825 02/02/22 2146 02/02/22 2258 02/04/22 0206 02/05/22 0353  NA 136 137 138  --  138 138 135  K 3.7 3.8 4.8  --  4.1 3.8 4.2  CL 101  --  99  --  100 96* 95*  CO2  --   --  24  --  26 27 27   GLUCOSE 112*  --  107*  --  102* 94 108*  BUN 19  --  19  --  19 22 33*  CREATININE 5.80*  --  6.01* 6.12* 6.18* 5.49* 8.30*  CALCIUM  --   --  8.3*  --  8.4* 8.4* 8.6*  MG  --   --  2.0  --  1.8 1.5*  --   PHOS  --   --  3.8  --  4.7* 4.2  --     GFR: Estimated Creatinine Clearance: 8.7 mL/min (A) (by C-G formula based on SCr of 8.3 mg/dL (H)). Liver Function Tests: Recent  Labs  Lab 02/02/22 1825  AST 117*  ALT 33  ALKPHOS 89  BILITOT 1.1  PROT 5.4*  ALBUMIN 2.0*    No results for input(s): "LIPASE", "AMYLASE" in the last 168 hours. No results for input(s): "AMMONIA" in the last 168 hours. Coagulation Profile: Recent Labs  Lab 02/02/22 1712  INR 1.3*    Cardiac Enzymes: No results for input(s): "CKTOTAL", "CKMB", "CKMBINDEX", "TROPONINI" in the last 168 hours. BNP (last 3 results) No results for input(s): "PROBNP" in the last 8760 hours. HbA1C: Recent Labs    02/02/22 2146  HGBA1C 5.4    CBG: Recent Labs  Lab 02/04/22  0017 02/04/22 1121 02/04/22 1638 02/04/22 2120 02/05/22 0728  GLUCAP 87 112* 105* 133* 120*    Lipid Profile: No results for input(s): "CHOL", "HDL", "LDLCALC", "TRIG", "CHOLHDL", "LDLDIRECT" in the last 72 hours. Thyroid Function Tests: Recent Labs    02/02/22 2258  TSH 1.790    Anemia Panel: No results for input(s): "VITAMINB12", "FOLATE", "FERRITIN", "TIBC", "IRON", "RETICCTPCT" in the last 72 hours. Sepsis Labs: Recent Labs  Lab 02/02/22 2146 02/02/22 2258 02/03/22 0127  PROCALCITON 3.44  --   --   LATICACIDVEN 2.3* 1.8 1.7     Recent Results (from the past 240 hour(s))  Surgical PCR screen     Status: None   Collection Time: 01/27/22  8:56 PM   Specimen: Nasal Mucosa; Nasal Swab  Result Value Ref Range Status   MRSA, PCR NEGATIVE NEGATIVE Final   Staphylococcus aureus NEGATIVE NEGATIVE Final    Comment: (NOTE) The Xpert SA Assay (FDA approved for NASAL specimens in patients 39 years of age and older), is one component of a comprehensive surveillance program. It is not intended to diagnose infection nor to guide or monitor treatment. Performed at [facility], [place] [address], [place], [place] [address]   Blood Culture (routine x 2)     Status: None (Preliminary result)   Collection Time: 02/02/22  5:12 PM   Specimen: BLOOD  Result Value Ref Range Status   Specimen Description  BLOOD RIGHT ANTECUBITAL  Final   Special Requests   Final    BOTTLES DRAWN AEROBIC AND ANAEROBIC Blood Culture results may not be optimal due to an inadequate volume of blood received in culture bottles   Culture   Final    NO GROWTH 3 DAYS Performed at [facility], [place] [address], [place], [place] [address]    Report Status PENDING  Incomplete  Blood Culture (routine x 2)     Status: None (Preliminary result)   Collection Time: 02/02/22  5:17 PM   Specimen: BLOOD RIGHT FOREARM  Result Value Ref Range Status   Specimen Description BLOOD RIGHT FOREARM  Final   Special Requests   Final    BOTTLES DRAWN AEROBIC AND ANAEROBIC Blood Culture results may not be optimal due to an inadequate volume of blood received in culture bottles   Culture   Final    NO GROWTH 3 DAYS Performed at [facility], [place] [address], [place], [place] [address]    Report Status PENDING  Incomplete  MRSA Next Gen by PCR, Nasal     Status: None   Collection Time: 02/02/22  9:21 PM   Specimen: Nasal Mucosa; Nasal Swab  Result Value Ref Range Status   MRSA by PCR Next Gen NOT DETECTED NOT DETECTED Final    Comment: (NOTE) The GeneXpert MRSA Assay (FDA approved for NASAL specimens only), is one component of a comprehensive MRSA colonization surveillance program. It is not intended to diagnose MRSA infection nor to guide or monitor treatment for MRSA infections. Test performance is not FDA approved in patients less than 34 years old. Performed at [facility], [place] [address], [place], [place] [address]          Radiology Studies: No results found.      Scheduled Meds:  sodium chloride   Intravenous Once   apixaban  5 mg Oral BID   atorvastatin  40 mg Oral QHS   calcitRIOL  0.25 mcg Oral Q M,W,F   calcium acetate  667  mg Oral TID WC   Chlorhexidine Gluconate Cloth  6 each Topical Q0600   Chlorhexidine Gluconate Cloth  6 each Topical Q0600   Chlorhexidine Gluconate Cloth  6 each  Topical Q0600   [START ON 02/07/2022] darbepoetin (ARANESP) injection - DIALYSIS  150 mcg Intravenous Q Thu-HD   docusate sodium  100 mg Oral BID   feeding supplement  237 mL Oral TID BM   insulin aspart  0-5 Units Subcutaneous QHS   insulin aspart  0-6 Units Subcutaneous TID WC   multivitamin  1 tablet Oral QHS   Continuous Infusions:  sodium chloride Stopped (02/02/22 2322)   ceFEPime (MAXIPIME) IV 1 g (02/04/22 2011)   ferric gluconate (FERRLECIT) IVPB      LOS: 3 days   Time spent: 76min  Tanner Kopischke C Emary Zalar, DO [facility]  If 7PM-7AM, please contact night-coverage [email or web address]  02/05/2022, 7:57 AM

## 2022-02-05 NOTE — Progress Notes (Signed)
TR[facility] night cross cover note:   I was notified by RN that the patient's hemoglobin this morning is 6.4, compared to 7.1 yesterday morning.  He appears hemodynamically stable, most recent blood pressure 115/73 with heart rate 71.  Oxygen saturation 100% on 2 L nasal cannula, unchanged throughout night shift.  Per my chart review, including review of most recent rounding hospitalist progress note, this is a 71 year old male with end-stage renal disease on hemodialysis.  He has not required any PRBC transfusion over at least the last 72 hours.  I confirmed with the patient's RN that the patient is due for scheduled hemodialysis today (6/20).  Consequently, as the patient appears hemodynamically stable and due for hemodialysis today, will defer PRBC transfusion so that it occur in concert with today's scheduled hemodialysis session.      Babs Bertin, DO Hospitalist

## 2022-02-05 NOTE — Progress Notes (Deleted)
PROGRESS NOTE    Tanner Rollins  HYW:[id number] DOB: 1951/05/09 DOA: 02/02/2022 PCP: Pcp, No   Brief Narrative:  This is a 71 year old male pt who was just discharged to SNF on 6/15 after being admitted for CP/HTn and Af w/ RVR. During this stay he was started on CCB, started HD and was eventually discharged to SNF w/ plans to cont rehab efforts and iHD.  Patient presented to hospital from dialysis with lethargy hypotension and tachycardia.  Admitted transiently to the ICU for hypotension given concern for need for pressors given end-stage renal dysfunction and dialysis and would be unable to tolerate massive volume resuscitation.  6/19 - blood pressure drastically improving without needing pressors, transitioned to hospitalist service 6/20 -moderate anemia, 6.4 requiring transfusion with dialysis, hypotension resolving  Assessment & Plan:   Principal Problem:   Shock ([place]) Active Problems:   Lethargy   Anemia of renal disease   ESRD (end stage renal disease) on dialysis (HCC)   A-fib (HCC)   Metabolic bone disease   Pleural effusion   Protein-calorie malnutrition, severe  Septic shock complicated by polypharmacy Symptomatic hypotension and bradycardia Complicated anemia, unspecified acute on chronic anemia of chronic disease - Presumed sepsis secondary to likely pneumonia given tachycardia and tachypnea with elevated procalcitonin imaging and symptoms  -Complicated by polypharmacy including calcium channel blocker and beta-blocker which likely limited patient's ability to respond appropriately -Previously received IV fluids, transition back to increased p.o. intake, hold further IV fluids in the setting of dialysis -Continue antibiotics for 5 days with cefepime, follow clinically -Transfuse 2 unit PRBC 02/05/2022   Lethargy 2/2 metabolic versus toxic encephalopathy -Likely secondary to above -Mental status appears to be improving somewhat, unclear if he is back to baseline at  this time but awake alert oriented x4   ESRD w/ metabolic bone disease  -Nephrology following, appreciate insight and recommendations -Continue dialysis per nephrology dialysis Tuesday Thursday Saturday -Trend anemia as above   Bilateral pleural effusion w/ mild pulmonary edema -Likely secondary to above with suspected pneumonia and volume overload -Continue dialysis as above -Careful with IV fluids in the setting of renal failure   Afib, permanent Rate controlled, continue Eliquis Home diltiazem and carvedilol held at intake -can resume once blood pressure and heart rate normalize   DVT prophylaxis: Eliquis Code Status: Full Family Communication: None present  Status is: Inpatient  Dispo: The patient is from: Facility              Anticipated d/c is to: Same              Anticipated d/c date is: 24 to 48 hours pending clinical course              Patient currently not medically stable for discharge  Consultants:  PCCM, nephrology  Procedures:  None  Antimicrobials:  Cefepime x5 days(vancomycin previously discontinued)  Subjective: No acute issues or events overnight, patient tolerating dialysis this morning receiving blood transfusion without issue or event.  Continues to feel less fatigued and weak than prior but still not back to baseline, quite somnolent but easily arousable.  Denies overt nausea vomiting diarrhea constipation headache fevers chills or chest pain  Objective: Vitals:   02/05/22 1124 02/05/22 1130 02/05/22 1140 02/05/22 1148  BP: 119/73 117/71 117/71   Pulse: 86 80 80   Resp: 15 17 17    Temp:   98.3 F (36.8 C)   TempSrc:   Oral   SpO2: 100% 100% 99%  Weight:    72.2 kg  Height:        Intake/Output Summary (Last 24 hours) at 02/05/2022 1701 Last data filed at 02/05/2022 1500 Gross per 24 hour  Intake 873 ml  Output 1200 ml  Net -327 ml    Filed Weights   02/05/22 0440 02/05/22 0758 02/05/22 1148  Weight: 73.3 kg 73.2 kg 72.2 kg     Examination:  General:  Pleasantly resting in bed, No acute distress. HEENT:  Normocephalic atraumatic.  Sclerae nonicteric, noninjected.  Extraocular movements intact bilaterally. Neck:  Without mass or deformity.  Trachea is midline. Lungs:  Clear to auscultate bilaterally without rhonchi, wheeze, or rales. Heart:  Regular rate and rhythm.  Without murmurs, rubs, or gallops. Abdomen:  Soft, nontender, nondistended.  Without guarding or rebound. Extremities: Without cyanosis, clubbing, edema, or obvious deformity. Vascular:  Dorsalis pedis and posterior tibial pulses palpable bilaterally. Skin:  Warm and dry, no erythema, no ulcerations.  Data Reviewed: I have personally reviewed following labs and imaging studies  CBC: Recent Labs  Lab 02/02/22 1712 02/02/22 1801 02/02/22 1802 02/02/22 2146 02/02/22 2258 02/04/22 0206 02/05/22 0353  WBC 8.6  --   --  9.2 8.9 9.3 9.7  NEUTROABS 6.0  --   --   --   --   --   --   HGB 8.4*   < > 8.8* 7.2* 7.1* 7.1* 6.4*  HCT 27.0*   < > 26.0* 23.4* 22.9* 22.4* 20.4*  MCV 92.8  --   --  94.0 91.6 91.8 91.5  PLT 288  --   --  250 260 308 278   < > = values in this interval not displayed.    Basic Metabolic Panel: Recent Labs  Lab 02/02/22 1801 02/02/22 1802 02/02/22 1825 02/02/22 2146 02/02/22 2258 02/04/22 0206 02/05/22 0353  NA 136 137 138  --  138 138 135  K 3.7 3.8 4.8  --  4.1 3.8 4.2  CL 101  --  99  --  100 96* 95*  CO2  --   --  24  --  26 27 27   GLUCOSE 112*  --  107*  --  102* 94 108*  BUN 19  --  19  --  19 22 33*  CREATININE 5.80*  --  6.01* 6.12* 6.18* 5.49* 8.30*  CALCIUM  --   --  8.3*  --  8.4* 8.4* 8.6*  MG  --   --  2.0  --  1.8 1.5*  --   PHOS  --   --  3.8  --  4.7* 4.2  --     GFR: Estimated Creatinine Clearance: 8.6 mL/min (A) (by C-G formula based on SCr of 8.3 mg/dL (H)). Liver Function Tests: Recent Labs  Lab 02/02/22 1825  AST 117*  ALT 33  ALKPHOS 89  BILITOT 1.1  PROT 5.4*  ALBUMIN 2.0*     No results for input(s): "LIPASE", "AMYLASE" in the last 168 hours. No results for input(s): "AMMONIA" in the last 168 hours. Coagulation Profile: Recent Labs  Lab 02/02/22 1712  INR 1.3*    Cardiac Enzymes: No results for input(s): "CKTOTAL", "CKMB", "CKMBINDEX", "TROPONINI" in the last 168 hours. BNP (last 3 results) No results for input(s): "PROBNP" in the last 8760 hours. HbA1C: Recent Labs    02/02/22 2146  HGBA1C 5.4    CBG: Recent Labs  Lab 02/04/22 1638 02/04/22 2120 02/05/22 0728 02/05/22 1245 02/05/22 1638  GLUCAP 105* 133* 120*  92 127*    Lipid Profile: No results for input(s): "CHOL", "HDL", "LDLCALC", "TRIG", "CHOLHDL", "LDLDIRECT" in the last 72 hours. Thyroid Function Tests: Recent Labs    02/02/22 2258  TSH 1.790    Anemia Panel: No results for input(s): "VITAMINB12", "FOLATE", "FERRITIN", "TIBC", "IRON", "RETICCTPCT" in the last 72 hours. Sepsis Labs: Recent Labs  Lab 02/02/22 2146 02/02/22 2258 02/03/22 0127  PROCALCITON 3.44  --   --   LATICACIDVEN 2.3* 1.8 1.7     Recent Results (from the past 240 hour(s))  Surgical PCR screen     Status: None   Collection Time: 01/27/22  8:56 PM   Specimen: Nasal Mucosa; Nasal Swab  Result Value Ref Range Status   MRSA, PCR NEGATIVE NEGATIVE Final   Staphylococcus aureus NEGATIVE NEGATIVE Final    Comment: (NOTE) The Xpert SA Assay (FDA approved for NASAL specimens in patients 3 years of age and older), is one component of a comprehensive surveillance program. It is not intended to diagnose infection nor to guide or monitor treatment. Performed at [facility], [place] [address] [place], [place] [address]   Blood Culture (routine x 2)     Status: None (Preliminary result)   Collection Time: 02/02/22  5:12 PM   Specimen: BLOOD  Result Value Ref Range Status   Specimen Description BLOOD RIGHT ANTECUBITAL  Final   Special Requests   Final    BOTTLES DRAWN AEROBIC AND ANAEROBIC  Blood Culture results may not be optimal due to an inadequate volume of blood received in culture bottles   Culture   Final    NO GROWTH 3 DAYS Performed at [facility], [place] [address], [place], [place] [address]    Report Status PENDING  Incomplete  Blood Culture (routine x 2)     Status: None (Preliminary result)   Collection Time: 02/02/22  5:17 PM   Specimen: BLOOD RIGHT FOREARM  Result Value Ref Range Status   Specimen Description BLOOD RIGHT FOREARM  Final   Special Requests   Final    BOTTLES DRAWN AEROBIC AND ANAEROBIC Blood Culture results may not be optimal due to an inadequate volume of blood received in culture bottles   Culture   Final    NO GROWTH 3 DAYS Performed at [facility], [place] [address], [place], [place] [address]    Report Status PENDING  Incomplete  MRSA Next Gen by PCR, Nasal     Status: None   Collection Time: 02/02/22  9:21 PM   Specimen: Nasal Mucosa; Nasal Swab  Result Value Ref Range Status   MRSA by PCR Next Gen NOT DETECTED NOT DETECTED Final    Comment: (NOTE) The GeneXpert MRSA Assay (FDA approved for NASAL specimens only), is one component of a comprehensive MRSA colonization surveillance program. It is not intended to diagnose MRSA infection nor to guide or monitor treatment for MRSA infections. Test performance is not FDA approved in patients less than 25 years old. Performed at [facility], [place] [address], [place], [place] [address]          Radiology Studies: No results found.      Scheduled Meds:  (feeding supplement) PROSource Plus  30 mL Oral BID BM   sodium chloride   Intravenous Once   apixaban  5 mg Oral BID   atorvastatin  40 mg Oral QHS   calcitRIOL  0.25 mcg Oral Q T,Th,Sa-HD   calcium acetate  667 mg Oral  TID WC   Chlorhexidine Gluconate Cloth  6 each Topical Q0600   Chlorhexidine Gluconate Cloth  6 each Topical Q0600   Chlorhexidine Gluconate Cloth  6 each Topical Q0600   [START ON 02/07/2022]  darbepoetin (ARANESP) injection - DIALYSIS  150 mcg Intravenous Q Thu-HD   docusate sodium  100 mg Oral BID   feeding supplement (NEPRO CARB STEADY)  237 mL Oral TID BM   heparin sodium (porcine)       insulin aspart  0-5 Units Subcutaneous QHS   insulin aspart  0-6 Units Subcutaneous TID WC   multivitamin  1 tablet Oral QHS   Continuous Infusions:  sodium chloride Stopped (02/02/22 2322)   ceFEPime (MAXIPIME) IV 1 g (02/04/22 2011)   ferric gluconate (FERRLECIT) IVPB 135 mL/hr at 02/05/22 1045    LOS: 3 days   Time spent: 32min  Barnie Sopko C Danika Kluender, DO [facility]  If 7PM-7AM, please contact night-coverage [email or web address]  02/05/2022, 5:01 PM

## 2022-02-05 NOTE — Progress Notes (Addendum)
[facility] Progress Note   Subjective: Seen on HD. BP soft, low UFG today. Seems more somnolent today. Opens eyes to verbal, then closes them again.     Objective Vitals:   02/05/22 0759 02/05/22 0810 02/05/22 0830 02/05/22 0839  BP: 120/86 112/76 105/73 105/73  Pulse:   82 87  Resp: 16 16 17 13   Temp: 97.9 F (36.6 C)     TempSrc: Oral     SpO2: 100% 100% 99% 98%  Weight:      Height:       Physical Exam General: Chronically ill appearing male in NAD Heart: S1,S2 RRR No M/R/G SR on monitor. Rate 90s.  Lungs: CTAB anteriorly. Abdomen: soft, non-distended, NABS Extremities: RLE 1+ pitting edema, LLE trace pre tib edema Dialysis Access: L AVF maturing. +T/B. RIJ TDC with blood lines connected.   Additional Objective Labs: Basic Metabolic Panel: Recent Labs  Lab 02/02/22 1825 02/02/22 2146 02/02/22 2258 02/04/22 0206 02/05/22 0353  NA 138  --  138 138 135  K 4.8  --  4.1 3.8 4.2  CL 99  --  100 96* 95*  CO2 24  --  26 27 27   GLUCOSE 107*  --  102* 94 108*  BUN 19  --  19 22 33*  CREATININE 6.01*   < > 6.18* 5.49* 8.30*  CALCIUM 8.3*  --  8.4* 8.4* 8.6*  PHOS 3.8  --  4.7* 4.2  --    < > = values in this interval not displayed.   Liver Function Tests: Recent Labs  Lab 02/02/22 1825  AST 117*  ALT 33  ALKPHOS 89  BILITOT 1.1  PROT 5.4*  ALBUMIN 2.0*   No results for input(s): "LIPASE", "AMYLASE" in the last 168 hours. CBC: Recent Labs  Lab 02/02/22 1712 02/02/22 1801 02/02/22 2146 02/02/22 2258 02/04/22 0206 02/05/22 0353  WBC 8.6  --  9.2 8.9 9.3 9.7  NEUTROABS 6.0  --   --   --   --   --   HGB 8.4*   < > 7.2* 7.1* 7.1* 6.4*  HCT 27.0*   < > 23.4* 22.9* 22.4* 20.4*  MCV 92.8  --  94.0 91.6 91.8 91.5  PLT 288  --  250 260 308 278   < > = values in this interval not displayed.   Blood Culture    Component Value Date/Time   SDES BLOOD RIGHT FOREARM 02/02/2022 1717   SPECREQUEST  02/02/2022 1717    BOTTLES DRAWN AEROBIC AND  ANAEROBIC Blood Culture results may not be optimal due to an inadequate volume of blood received in culture bottles   CULT  02/02/2022 1717    NO GROWTH 3 DAYS Performed at [facility], [place] [address] [place], [place] [address]    REPTSTATUS PENDING 02/02/2022 1717    Cardiac Enzymes: No results for input(s): "CKTOTAL", "CKMB", "CKMBINDEX", "TROPONINI" in the last 168 hours. CBG: Recent Labs  Lab 02/04/22 0742 02/04/22 1121 02/04/22 1638 02/04/22 2120 02/05/22 0728  GLUCAP 87 112* 105* 133* 120*   Iron Studies: No results for input(s): "IRON", "TIBC", "TRANSFERRIN", "FERRITIN" in the last 72 hours. @lablastinr3 @ Studies/Results: No results found. Medications:  sodium chloride Stopped (02/02/22 2322)   ceFEPime (MAXIPIME) IV 1 g (02/04/22 2011)   ferric gluconate (FERRLECIT) IVPB 250 mg (02/05/22 0844)    heparin sodium (porcine)       sodium chloride   Intravenous Once   apixaban  5 mg Oral  BID   atorvastatin  40 mg Oral QHS   calcitRIOL  0.25 mcg Oral Q M,W,F   calcium acetate  667 mg Oral TID WC   Chlorhexidine Gluconate Cloth  6 each Topical Q0600   Chlorhexidine Gluconate Cloth  6 each Topical Q0600   Chlorhexidine Gluconate Cloth  6 each Topical Q0600   [START ON 02/07/2022] darbepoetin (ARANESP) injection - DIALYSIS  150 mcg Intravenous Q Thu-HD   docusate sodium  100 mg Oral BID   feeding supplement  237 mL Oral TID BM   insulin aspart  0-5 Units Subcutaneous QHS   insulin aspart  0-6 Units Subcutaneous TID WC   multivitamin  1 tablet Oral QHS     TTS [place] (has not started there yet, just dc'd on 6/15 after new start)    Assessment/Plan:  Undifferentiated Shock - per CCM, suspected possibly d/t new BP / afib medications. All BP lowering meds were held on admission.  Improved.   afebrile, blood cultures pending - NGTD but low volume samples    AMS - much improved it seems. Likely due to #1, doubt uremia. ESRD - recent start on 6/08. Last HD here as  inpt on 6/14. Missed OP HD 6/17. Has TDC, also L 1st stage BVT AVF done 6/12 by Dr Scot Dock. AFib-SR on monitor. Beta blocker on hold. On Apixaban.  Volume - overload w/mild pedal edema and pleural effusions on CXR. Unfortunately hypotension prohibiting volume removal.  Anemia-HGB 6.4 this AM. Getting 2 units of PRBCs on HD today. On weekly ESA, getting Fe load.  CKD BMD-Corrected Ca+ 9.9. Change binders to renvela 800 mg 2 tabs PO TID AC. Marland Kitchen PTH 447. Corrected Ca+ prohibitive of increasing VDRA.  8. Nutrition-Albumin low. On regular diet. Add protein supplementation.     Rita H. Brown NP-C 02/05/2022, 9:13 AM  [facility] [phone]   Seen and examined independently earlier this AM.  Agree with note and exam as documented above by physician extender and as noted here.  See procedure note from today  Claudia Desanctis, MD 02/05/2022  5:57 PM

## 2022-02-06 LAB — BPAM RBC
Blood Product Expiration Date: 202307092359
Blood Product Expiration Date: 202307102359
ISSUE DATE / TIME: 202306200923
ISSUE DATE / TIME: 202306200923
Unit Type and Rh: 6200
Unit Type and Rh: 6200

## 2022-02-06 LAB — BASIC METABOLIC PANEL
Anion gap: 12 (ref 5–15)
BUN: 29 mg/dL — ABNORMAL HIGH (ref 8–23)
CO2: 26 mmol/L (ref 22–32)
Calcium: 8.8 mg/dL — ABNORMAL LOW (ref 8.9–10.3)
Chloride: 97 mmol/L — ABNORMAL LOW (ref 98–111)
Creatinine, Ser: 7.13 mg/dL — ABNORMAL HIGH (ref 0.61–1.24)
GFR, Estimated: 8 mL/min — ABNORMAL LOW (ref >60)
Glucose, Bld: 90 mg/dL (ref 70–99)
Potassium: 3.9 mmol/L (ref 3.5–5.1)
Sodium: 135 mmol/L (ref 135–145)

## 2022-02-06 LAB — CBC
HCT: 26.2 % — ABNORMAL LOW (ref 39.0–52.0)
Hemoglobin: 9 g/dL — ABNORMAL LOW (ref 13.0–17.0)
MCH: 30.7 pg (ref 26.0–34.0)
MCHC: 34.4 g/dL (ref 30.0–36.0)
MCV: 89.4 fL (ref 80.0–100.0)
Platelets: 299 10*3/uL (ref 150–400)
RBC: 2.93 MIL/uL — ABNORMAL LOW (ref 4.22–5.81)
RDW: 15.9 % — ABNORMAL HIGH (ref 11.5–15.5)
WBC: 12 10*3/uL — ABNORMAL HIGH (ref 4.0–10.5)
nRBC: 0.3 % — ABNORMAL HIGH (ref 0.0–0.2)

## 2022-02-06 LAB — TYPE AND SCREEN
ABO/RH(D): A POS
Antibody Screen: NEGATIVE
Unit division: 0
Unit division: 0

## 2022-02-06 LAB — GLUCOSE, CAPILLARY
Glucose-Capillary: 110 mg/dL — ABNORMAL HIGH (ref 70–99)
Glucose-Capillary: 100 mg/dL — ABNORMAL HIGH (ref 70–99)
Glucose-Capillary: 92 mg/dL (ref 70–99)
Glucose-Capillary: 111 mg/dL — ABNORMAL HIGH (ref 70–99)

## 2022-02-06 MED ORDER — MIDODRINE HCL 5 MG PO TABS
10.0000 mg | ORAL_TABLET | ORAL | Status: DC
  Administered 2022-02-07 – 2022-02-12 (×5): 10 mg via ORAL
  Filled 2022-02-06 (×7): qty 2

## 2022-02-06 MED ORDER — SEVELAMER CARBONATE 800 MG PO TABS
1600.0000 mg | ORAL_TABLET | Freq: Three times a day (TID) | ORAL | Status: DC
  Administered 2022-02-06 – 2022-02-13 (×14): 1600 mg via ORAL
  Filled 2022-02-06 (×14): qty 2

## 2022-02-06 NOTE — Progress Notes (Signed)
PROGRESS NOTE    Tanner Rollins  QJJ:[id number] DOB: Sep 12, 1950 DOA: 02/02/2022 PCP: Pcp, No   Brief Narrative:  This is a 71 year old male pt who was just discharged to SNF on 6/15 after being admitted for CP/HTn and Af w/ RVR. During this stay he was started on CCB, started HD and was eventually discharged to SNF w/ plans to cont rehab efforts and iHD.  Patient presented to hospital from dialysis with lethargy hypotension and tachycardia.  Admitted transiently to the ICU for hypotension given concern for need for pressors given end-stage renal dysfunction and dialysis and would be unable to tolerate massive volume resuscitation.  6/19 - blood pressure drastically improving without needing pressors, transitioned to hospitalist service 6/20 -moderate anemia, 6.4 requiring transfusion with dialysis, hypotension resolving 6/21 -mental status hypotension and anemia resolving, not yet back to baseline but improving with antibiotics, supportive care and discontinuation of heart rate medications as above.  Assessment & Plan:   Principal Problem:   Shock ([place]) Active Problems:   Lethargy   Anemia of renal disease   ESRD (end stage renal disease) on dialysis (HCC)   A-fib (HCC)   Metabolic bone disease   Pleural effusion   Protein-calorie malnutrition, severe  Septic shock complicated by polypharmacy Symptomatic hypotension and bradycardia Complicated anemia, unspecified acute on chronic anemia of chronic disease - Presumed sepsis secondary to likely pneumonia given tachycardia and tachypnea with elevated procalcitonin imaging and symptoms  -Complicated by polypharmacy including calcium channel blocker and beta-blocker which likely limited patient's ability to respond appropriately -Previously received IV fluids, transition back to increased p.o. intake, hold further IV fluids in the setting of dialysis -Continue antibiotics for 5 days with cefepime, follow clinically -Transfuse 2 unit  PRBC 02/05/2022   Lethargy 2/2 metabolic versus toxic encephalopathy -Likely secondary to above -Mental status appears to be improving somewhat, unclear if he is back to baseline at this time but awake alert oriented x4   ESRD w/ metabolic bone disease  -Nephrology following, appreciate insight and recommendations -Continue dialysis per nephrology dialysis Tuesday Thursday Saturday -Trend anemia as above   Bilateral pleural effusion w/ mild pulmonary edema -Likely secondary to above with suspected pneumonia and volume overload -Continue dialysis as above -Careful with IV fluids in the setting of renal failure   Afib, permanent Rate controlled, continue Eliquis Home diltiazem and carvedilol held at intake -can resume once blood pressure and heart rate normalize   DVT prophylaxis: Eliquis Code Status: Full Family Communication: None present  Status is: Inpatient  Dispo: The patient is from: Facility              Anticipated d/c is to: Same              Anticipated d/c date is: 24 to 48 hours pending clinical course              Patient currently not medically stable for discharge  Consultants:  PCCM, nephrology  Procedures:  None  Antimicrobials:  Cefepime x5 days(vancomycin previously discontinued)  Subjective: No acute issues or events overnight, patient much more awake alert oriented today more interactive denies any overt symptoms of chest pain shortness of breath nausea vomiting diarrhea constipation headache fevers chills or chest pain.  Continues to be markedly fatigued weak complaining of general malaise.  Objective: Vitals:   02/05/22 1636 02/05/22 2033 02/06/22 0435 02/06/22 0904  BP: 118/67 104/68 (!) 141/90 (!) 148/81  Pulse: 78 78 88 88  Resp: 18 18 18  17  Temp: 98 F (36.7 C) 97.7 F (36.5 C) 97.9 F (36.6 C) 99.8 F (37.7 C)  TempSrc: Oral Oral Oral Oral  SpO2: 98% 98% 98% 93%  Weight:   72.3 kg   Height:        Intake/Output Summary (Last 24  hours) at 02/06/2022 1412 Last data filed at 02/06/2022 1100 Gross per 24 hour  Intake 420 ml  Output 100 ml  Net 320 ml    Filed Weights   02/05/22 0758 02/05/22 1148 02/06/22 0435  Weight: 73.2 kg 72.2 kg 72.3 kg    Examination:  General:  Pleasantly resting in bed, No acute distress. HEENT:  Normocephalic atraumatic.  Sclerae nonicteric, noninjected.  Extraocular movements intact bilaterally. Neck:  Without mass or deformity.  Trachea is midline. Lungs:  Clear to auscultate bilaterally without rhonchi, wheeze, or rales. Heart:  Regular rate and rhythm.  Without murmurs, rubs, or gallops. Abdomen:  Soft, nontender, nondistended.  Without guarding or rebound. Extremities: Without cyanosis, clubbing, edema, or obvious deformity. Vascular:  Dorsalis pedis and posterior tibial pulses palpable bilaterally. Skin:  Warm and dry, no erythema, no ulcerations.  Data Reviewed: I have personally reviewed following labs and imaging studies  CBC: Recent Labs  Lab 02/02/22 1712 02/02/22 1801 02/02/22 2146 02/02/22 2258 02/04/22 0206 02/05/22 0353 02/06/22 0720  WBC 8.6  --  9.2 8.9 9.3 9.7 12.0*  NEUTROABS 6.0  --   --   --   --   --   --   HGB 8.4*   < > 7.2* 7.1* 7.1* 6.4* 9.0*  HCT 27.0*   < > 23.4* 22.9* 22.4* 20.4* 26.2*  MCV 92.8  --  94.0 91.6 91.8 91.5 89.4  PLT 288  --  250 260 308 278 299   < > = values in this interval not displayed.    Basic Metabolic Panel: Recent Labs  Lab 02/02/22 1825 02/02/22 2146 02/02/22 2258 02/04/22 0206 02/05/22 0353 02/06/22 0720  NA 138  --  138 138 135 135  K 4.8  --  4.1 3.8 4.2 3.9  CL 99  --  100 96* 95* 97*  CO2 24  --  26 27 27 26   GLUCOSE 107*  --  102* 94 108* 90  BUN 19  --  19 22 33* 29*  CREATININE 6.01* 6.12* 6.18* 5.49* 8.30* 7.13*  CALCIUM 8.3*  --  8.4* 8.4* 8.6* 8.8*  MG 2.0  --  1.8 1.5*  --   --   PHOS 3.8  --  4.7* 4.2  --   --     GFR: Estimated Creatinine Clearance: 10 mL/min (A) (by C-G formula based  on SCr of 7.13 mg/dL (H)). Liver Function Tests: Recent Labs  Lab 02/02/22 1825  AST 117*  ALT 33  ALKPHOS 89  BILITOT 1.1  PROT 5.4*  ALBUMIN 2.0*    No results for input(s): "LIPASE", "AMYLASE" in the last 168 hours. No results for input(s): "AMMONIA" in the last 168 hours. Coagulation Profile: Recent Labs  Lab 02/02/22 1712  INR 1.3*    Cardiac Enzymes: No results for input(s): "CKTOTAL", "CKMB", "CKMBINDEX", "TROPONINI" in the last 168 hours. BNP (last 3 results) No results for input(s): "PROBNP" in the last 8760 hours. HbA1C: No results for input(s): "HGBA1C" in the last 72 hours.  CBG: Recent Labs  Lab 02/05/22 1245 02/05/22 1638 02/05/22 2030 02/06/22 0730 02/06/22 1120  GLUCAP 92 127* 119* 110* 100*    Lipid Profile: No  results for input(s): "CHOL", "HDL", "LDLCALC", "TRIG", "CHOLHDL", "LDLDIRECT" in the last 72 hours. Thyroid Function Tests: No results for input(s): "TSH", "T4TOTAL", "FREET4", "T3FREE", "THYROIDAB" in the last 72 hours.  Anemia Panel: No results for input(s): "VITAMINB12", "FOLATE", "FERRITIN", "TIBC", "IRON", "RETICCTPCT" in the last 72 hours. Sepsis Labs: Recent Labs  Lab 02/02/22 2146 02/02/22 2258 02/03/22 0127  PROCALCITON 3.44  --   --   LATICACIDVEN 2.3* 1.8 1.7     Recent Results (from the past 240 hour(s))  Surgical PCR screen     Status: None   Collection Time: 01/27/22  8:56 PM   Specimen: Nasal Mucosa; Nasal Swab  Result Value Ref Range Status   MRSA, PCR NEGATIVE NEGATIVE Final   Staphylococcus aureus NEGATIVE NEGATIVE Final    Comment: (NOTE) The Xpert SA Assay (FDA approved for NASAL specimens in patients 61 years of age and older), is one component of a comprehensive surveillance program. It is not intended to diagnose infection nor to guide or monitor treatment. Performed at [facility], [place] [address], [place], [place] [address]   Blood Culture (routine x 2)     Status: None (Preliminary  result)   Collection Time: 02/02/22  5:12 PM   Specimen: BLOOD  Result Value Ref Range Status   Specimen Description BLOOD RIGHT ANTECUBITAL  Final   Special Requests   Final    BOTTLES DRAWN AEROBIC AND ANAEROBIC Blood Culture results may not be optimal due to an inadequate volume of blood received in culture bottles   Culture   Final    NO GROWTH 4 DAYS Performed at [facility], [place] [address], [place], [place] [address]    Report Status PENDING  Incomplete  Blood Culture (routine x 2)     Status: None (Preliminary result)   Collection Time: 02/02/22  5:17 PM   Specimen: BLOOD RIGHT FOREARM  Result Value Ref Range Status   Specimen Description BLOOD RIGHT FOREARM  Final   Special Requests   Final    BOTTLES DRAWN AEROBIC AND ANAEROBIC Blood Culture results may not be optimal due to an inadequate volume of blood received in culture bottles   Culture   Final    NO GROWTH 4 DAYS Performed at [facility], [place] [address], [place], [place] [address]    Report Status PENDING  Incomplete  MRSA Next Gen by PCR, Nasal     Status: None   Collection Time: 02/02/22  9:21 PM   Specimen: Nasal Mucosa; Nasal Swab  Result Value Ref Range Status   MRSA by PCR Next Gen NOT DETECTED NOT DETECTED Final    Comment: (NOTE) The GeneXpert MRSA Assay (FDA approved for NASAL specimens only), is one component of a comprehensive MRSA colonization surveillance program. It is not intended to diagnose MRSA infection nor to guide or monitor treatment for MRSA infections. Test performance is not FDA approved in patients less than 25 years old. Performed at [facility], [place] [address], [place], [place] [address]          Radiology Studies: No results found.      Scheduled Meds:  (feeding supplement) PROSource Plus  30 mL Oral BID BM   sodium chloride   Intravenous Once   apixaban  5 mg Oral BID   atorvastatin  40 mg Oral QHS   calcitRIOL  0.25 mcg Oral Q T,Th,Sa-HD    Chlorhexidine Gluconate Cloth  6 each Topical Q0600   Chlorhexidine  Gluconate Cloth  6 each Topical Q0600   Chlorhexidine Gluconate Cloth  6 each Topical Q0600   [START ON 02/07/2022] darbepoetin (ARANESP) injection - DIALYSIS  150 mcg Intravenous Q Thu-HD   docusate sodium  100 mg Oral BID   feeding supplement (NEPRO CARB STEADY)  237 mL Oral TID BM   insulin aspart  0-5 Units Subcutaneous QHS   insulin aspart  0-6 Units Subcutaneous TID WC   [START ON 02/07/2022] midodrine  10 mg Oral Q T,Th,Sa-HD   multivitamin  1 tablet Oral QHS   sevelamer carbonate  1,600 mg Oral TID WC   Continuous Infusions:  sodium chloride Stopped (02/02/22 2322)   ceFEPime (MAXIPIME) IV 1 g (02/05/22 2035)   ferric gluconate (FERRLECIT) IVPB 135 mL/hr at 02/05/22 1045    LOS: 4 days   Time spent: 51min  Jacolby Risby C Mardell Cragg, DO [facility]  If 7PM-7AM, please contact night-coverage [email or web address]  02/06/2022, 2:12 PM

## 2022-02-06 NOTE — Progress Notes (Addendum)
[facility] Progress Note   Subjective: Awake, C/O SOB on RA. O2 tubing in bed with patient. Resume O2 @ 2L/M with improvement in SOB. HD tomorrow on schedule.   Objective Vitals:   02/05/22 1636 02/05/22 2033 02/06/22 0435 02/06/22 0904  BP: 118/67 104/68 (!) 141/90 (!) 148/81  Pulse: 78 78 88 88  Resp: 18 18 18 17   Temp: 98 F (36.7 C) 97.7 F (36.5 C) 97.9 F (36.6 C) 99.8 F (37.7 C)  TempSrc: Oral Oral Oral Oral  SpO2: 98% 98% 98% 93%  Weight:   72.3 kg   Height:       Physical Exam General: Chronically ill appearing male in NAD Heart: S1,S2 RRR No M/R/G  Lungs: CTAB anteriorly. Abdomen: soft, non-distended, NABS Extremities: Trace BLE edema Dialysis Access: L AVF maturing. +T/B. RIJ TDC Drsg intact    Additional Objective Labs: Basic Metabolic Panel: Recent Labs  Lab 02/02/22 1825 02/02/22 2146 02/02/22 2258 02/04/22 0206 02/05/22 0353 02/06/22 0720  NA 138  --  138 138 135 135  K 4.8  --  4.1 3.8 4.2 3.9  CL 99  --  100 96* 95* 97*  CO2 24  --  26 27 27 26   GLUCOSE 107*  --  102* 94 108* 90  BUN 19  --  19 22 33* 29*  CREATININE 6.01*   < > 6.18* 5.49* 8.30* 7.13*  CALCIUM 8.3*  --  8.4* 8.4* 8.6* 8.8*  PHOS 3.8  --  4.7* 4.2  --   --    < > = values in this interval not displayed.   Liver Function Tests: Recent Labs  Lab 02/02/22 1825  AST 117*  ALT 33  ALKPHOS 89  BILITOT 1.1  PROT 5.4*  ALBUMIN 2.0*   No results for input(s): "LIPASE", "AMYLASE" in the last 168 hours. CBC: Recent Labs  Lab 02/02/22 1712 02/02/22 1801 02/02/22 2146 02/02/22 2258 02/04/22 0206 02/05/22 0353 02/06/22 0720  WBC 8.6  --  9.2 8.9 9.3 9.7 12.0*  NEUTROABS 6.0  --   --   --   --   --   --   HGB 8.4*   < > 7.2* 7.1* 7.1* 6.4* 9.0*  HCT 27.0*   < > 23.4* 22.9* 22.4* 20.4* 26.2*  MCV 92.8  --  94.0 91.6 91.8 91.5 89.4  PLT 288  --  250 260 308 278 299   < > = values in this interval not displayed.   Blood Culture    Component Value  Date/Time   SDES BLOOD RIGHT FOREARM 02/02/2022 1717   SPECREQUEST  02/02/2022 1717    BOTTLES DRAWN AEROBIC AND ANAEROBIC Blood Culture results may not be optimal due to an inadequate volume of blood received in culture bottles   CULT  02/02/2022 1717    NO GROWTH 4 DAYS Performed at [facility], [place] [address], [place], [place] [address]    REPTSTATUS PENDING 02/02/2022 1717    Cardiac Enzymes: No results for input(s): "CKTOTAL", "CKMB", "CKMBINDEX", "TROPONINI" in the last 168 hours. CBG: Recent Labs  Lab 02/05/22 1245 02/05/22 1638 02/05/22 2030 02/06/22 0730 02/06/22 1120  GLUCAP 92 127* 119* 110* 100*   Iron Studies: No results for input(s): "IRON", "TIBC", "TRANSFERRIN", "FERRITIN" in the last 72 hours. @lablastinr3 @ Studies/Results: No results found. Medications:  sodium chloride Stopped (02/02/22 2322)   ceFEPime (MAXIPIME) IV 1 g (02/05/22 2035)   ferric gluconate (FERRLECIT) IVPB 135 mL/hr at 02/05/22 1045    (  feeding supplement) PROSource Plus  30 mL Oral BID BM   sodium chloride   Intravenous Once   apixaban  5 mg Oral BID   atorvastatin  40 mg Oral QHS   calcitRIOL  0.25 mcg Oral Q T,Th,Sa-HD   calcium acetate  667 mg Oral TID WC   Chlorhexidine Gluconate Cloth  6 each Topical Q0600   Chlorhexidine Gluconate Cloth  6 each Topical Q0600   Chlorhexidine Gluconate Cloth  6 each Topical Q0600   [START ON 02/07/2022] darbepoetin (ARANESP) injection - DIALYSIS  150 mcg Intravenous Q Thu-HD   docusate sodium  100 mg Oral BID   feeding supplement (NEPRO CARB STEADY)  237 mL Oral TID BM   insulin aspart  0-5 Units Subcutaneous QHS   insulin aspart  0-6 Units Subcutaneous TID WC   multivitamin  1 tablet Oral QHS     TTS [place] (has not started there yet, just dc'd on 6/15 after new start)     Assessment/Plan:  Undifferentiated Shock - per CCM, suspected possibly d/t new BP / afib medications. All BP lowering meds were held on admission.  Improved.    afebrile, blood cultures NGTD but low volume samples  On Cefepime for 5 doses.  AMS - resolved. Oriented X 3.  ESRD - recent start on 6/08. Has TDC, also L 1st stage BVT AVF done 6/12 by Dr Scot Dock. On T,Th,S Schedule. Next HD 02/07/2022 AFib-SR on monitor. Beta blocker on hold. On Apixaban.  Volume - overload w/mild pedal edema and pleural effusions on CXR. Unfortunately hypotension prohibiting volume removal. HD 06/20 Net UF 1.2. Continue lowering volume as tolerated.Start midodrine 10 mg PO TIW prior ot HD.  Anemia-HGB 9.0 this AM.after 2 units of PRBCs 02/05/2021. On weekly ESA, getting Fe load.  CKD BMD-Corrected Ca+ 9.9. Change binders to renvela 800 mg 2 tabs PO TID AC. Marland Kitchen PTH 447. Corrected Ca+ prohibitive of increasing VDRA.  8. Nutrition-Albumin low. On regular diet. Add protein supplementation.        Rita H. Brown NP-C 02/06/2022, 11:25 AM  [facility] [phone]   Seen and examined independently this AM.  Agree with note and exam as documented above by physician extender and as noted here.  Hypotension improved.  He feels better after the blood.   General adult male in bed in no acute distress HEENT normocephalic atraumatic extraocular movements intact sclera anicteric Neck supple trachea midline Lungs clear to auscultation bilaterally normal work of breathing at rest on room air Heart S1S2 no rub Abdomen soft nontender nondistended Extremities no  edema  Psych normal mood and affect RIJ tunn catheter; LUE AVF bruit and thrill with arm swollen  ESRD - HD per TTS schedule Hypotension/shock - resolved.   Anemia CKD - s/p 2 units PRBC's on 6/20 Afib - meds are on hold - per primary team   Claudia Desanctis, MD 02/06/2022  2:54 PM

## 2022-02-07 ENCOUNTER — Other Ambulatory Visit: Payer: Self-pay

## 2022-02-07 ENCOUNTER — Encounter (HOSPITAL_COMMUNITY): Payer: Self-pay | Admitting: Emergency Medicine

## 2022-02-07 LAB — GLUCOSE, CAPILLARY
Glucose-Capillary: 92 mg/dL (ref 70–99)
Glucose-Capillary: 101 mg/dL — ABNORMAL HIGH (ref 70–99)
Glucose-Capillary: 113 mg/dL — ABNORMAL HIGH (ref 70–99)
Glucose-Capillary: 115 mg/dL — ABNORMAL HIGH (ref 70–99)

## 2022-02-07 LAB — CBC
HCT: 25.8 % — ABNORMAL LOW (ref 39.0–52.0)
Hemoglobin: 8.3 g/dL — ABNORMAL LOW (ref 13.0–17.0)
MCH: 29.2 pg (ref 26.0–34.0)
MCHC: 32.2 g/dL (ref 30.0–36.0)
MCV: 90.8 fL (ref 80.0–100.0)
Platelets: 274 10*3/uL (ref 150–400)
RBC: 2.84 MIL/uL — ABNORMAL LOW (ref 4.22–5.81)
RDW: 16 % — ABNORMAL HIGH (ref 11.5–15.5)
WBC: 10 10*3/uL (ref 4.0–10.5)
nRBC: 0.3 % — ABNORMAL HIGH (ref 0.0–0.2)

## 2022-02-07 LAB — BASIC METABOLIC PANEL
Anion gap: 15 (ref 5–15)
BUN: 37 mg/dL — ABNORMAL HIGH (ref 8–23)
CO2: 24 mmol/L (ref 22–32)
Calcium: 8.8 mg/dL — ABNORMAL LOW (ref 8.9–10.3)
Chloride: 97 mmol/L — ABNORMAL LOW (ref 98–111)
Creatinine, Ser: 9.21 mg/dL — ABNORMAL HIGH (ref 0.61–1.24)
GFR, Estimated: 6 mL/min — ABNORMAL LOW (ref >60)
Glucose, Bld: 94 mg/dL (ref 70–99)
Potassium: 3.7 mmol/L (ref 3.5–5.1)
Sodium: 136 mmol/L (ref 135–145)

## 2022-02-07 LAB — CULTURE, BLOOD (ROUTINE X 2)
Culture: NO GROWTH
Culture: NO GROWTH

## 2022-02-07 MED ORDER — HEPARIN SODIUM (PORCINE) 1000 UNIT/ML DIALYSIS
1000.0000 [IU] | INTRAMUSCULAR | Status: DC | PRN
  Administered 2022-02-07: 1000 [IU]
  Filled 2022-02-07: qty 1

## 2022-02-07 MED ORDER — ANTICOAGULANT SODIUM CITRATE 4% (200MG/5ML) IV SOLN: 5.0000 mL | Status: DC | PRN

## 2022-02-07 MED ORDER — PENTAFLUOROPROP-TETRAFLUOROETH EX AERO: 1.0000 | INHALATION_SPRAY | CUTANEOUS | Status: DC | PRN

## 2022-02-07 MED ORDER — ALTEPLASE 2 MG IJ SOLR: 2.0000 mg | Freq: Once | INTRAMUSCULAR | Status: DC | PRN

## 2022-02-07 MED ORDER — LIDOCAINE HCL (PF) 1 % IJ SOLN: 5.0000 mL | INTRAMUSCULAR | Status: DC | PRN

## 2022-02-07 MED ORDER — LIDOCAINE-PRILOCAINE 2.5-2.5 % EX CREA: 1.0000 | TOPICAL_CREAM | CUTANEOUS | Status: DC | PRN

## 2022-02-07 NOTE — Progress Notes (Addendum)
PROGRESS NOTE    Tanner Rollins  DVV:[id number] DOB: 04/24/1951 DOA: 02/02/2022 PCP: Pcp, No   Brief Narrative:  This is a 71 year old male pt who was just discharged to SNF on 6/15 after being admitted for CP/HTn and Af w/ RVR. During this stay he was started on CCB, started HD and was eventually discharged to SNF w/ plans to cont rehab efforts and iHD.  Patient presented to hospital from dialysis with lethargy hypotension and tachycardia.  Admitted transiently to the ICU for hypotension given concern for need for pressors given end-stage renal dysfunction and dialysis and would be unable to tolerate massive volume resuscitation.  6/19 - blood pressure drastically improving without needing pressors, transitioned to hospitalist service 6/20 -moderate anemia, 6.4 requiring transfusion with dialysis, hypotension resolving 6/21 -mental status hypotension and anemia resolving, not yet back to baseline but improving with antibiotics, supportive care and discontinuation of heart rate medications as above 6/22 -Remains somewhat somnolent after dialysis, weak, unable to participate with PT/interview very well. Family unavailable by phone x2 days.  Assessment & Plan:   Principal Problem:   Shock ([place]) Active Problems:   Lethargy   Anemia of renal disease   ESRD (end stage renal disease) on dialysis (HCC)   A-fib (HCC)   Metabolic bone disease   Pleural effusion   Protein-calorie malnutrition, severe  Septic shock complicated by polypharmacy Symptomatic hypotension and bradycardia Complicated anemia, unspecified acute on chronic anemia of chronic disease - Presumed sepsis secondary to likely pneumonia given tachycardia and tachypnea with elevated procalcitonin imaging and symptoms  -Complicated by polypharmacy including calcium channel blocker and beta-blocker which likely limited patient's ability to respond appropriately -Previously received IV fluids, transition back to increased p.o.  intake, hold further IV fluids in the setting of dialysis -Continue antibiotics for 5 days with cefepime, follow clinically -Transfuse 2 unit PRBC 02/05/2022   Lethargy 2/2 metabolic versus toxic encephalopathy -Likely secondary to above -Mental status appears to be improving somewhat, unclear if he is back to baseline at this time but awake alert oriented x4   ESRD w/ metabolic bone disease  -Nephrology following, appreciate insight and recommendations -Continue dialysis per nephrology dialysis Tuesday Thursday Saturday -Trend anemia as above   Goals of care  -Ongoing concern that patient's lethargy is minimally improving, unclear if he is tolerating dialysis very well -We will touch base with family hopefully the next day or 2 as well as have more meaningful discussion with patient as he is more awake and alert on nondialysis days to have discussion about goals of care in the setting of infection and dialysis. -He remains full code at this time although patient was previously DNR in July 2022, will follow-up with family for goals of care or living will  Bilateral pleural effusion w/ mild pulmonary edema -Likely secondary to above with suspected pneumonia and volume overload -Continue dialysis as above -Careful with IV fluids in the setting of renal failure   Afib, permanent Rate controlled, continue Eliquis Home diltiazem and carvedilol held at intake -can resume once blood pressure and heart rate normalize   DVT prophylaxis: Eliquis Code Status: Full Family Communication: None present, called sister x2, no answer.  Status is: Inpatient  Dispo: The patient is from: Facility              Anticipated d/c is to: Same              Anticipated d/c date is: 24 to 48 hours pending clinical course  Patient currently not medically stable for discharge  Consultants:  PCCM, nephrology  Procedures:  None  Antimicrobials:  Cefepime x5 days(vancomycin previously  discontinued)  Subjective: No acute issues or events overnight, patient much more awake alert and oriented than at intake but remains somewhat somnolent today after dialysis, reports feeling "wiped out" and is unable to have very meaningful conversation today.  Unfortunately family was unavailable for further discussion of goals of care.  Objective: Vitals:   02/07/22 1300 02/07/22 1330 02/07/22 1348 02/07/22 1457  BP: 118/85 108/85 101/79 134/89  Pulse: 83 85 82 80  Resp: 17 19 18 18   Temp:    98 F (36.7 C)  TempSrc:      SpO2: 98% 95% 97% 97%  Weight:      Height:        Intake/Output Summary (Last 24 hours) at 02/07/2022 1546 Last data filed at 02/07/2022 1348 Gross per 24 hour  Intake 240 ml  Output 2243 ml  Net -2003 ml    Filed Weights   02/06/22 0435 02/07/22 0625 02/07/22 0944  Weight: 72.3 kg 73.5 kg 74.2 kg    Examination:  General:  Pleasantly resting in bed, No acute distress.  Tolerating dialysis quite well Neuro: Somnolent but arousable, oriented to person and place but quite lethargic, limited interview HEENT:  Normocephalic atraumatic.  Sclerae nonicteric, noninjected.  Extraocular movements intact bilaterally. Neck:  Without mass or deformity.  Trachea is midline. Lungs:  Clear to auscultate bilaterally without rhonchi, wheeze, or rales. Heart:  Regular rate and rhythm.  Without murmurs, rubs, or gallops. Extremities: Without cyanosis, clubbing, edema, or obvious deformity. Vascular:  Dorsalis pedis and posterior tibial pulses palpable bilaterally. Skin:  Warm and dry, no erythema  Data Reviewed: I have personally reviewed following labs and imaging studies  CBC: Recent Labs  Lab 02/02/22 1712 02/02/22 1801 02/02/22 2258 02/04/22 0206 02/05/22 0353 02/06/22 0720 02/07/22 0406  WBC 8.6   < > 8.9 9.3 9.7 12.0* 10.0  NEUTROABS 6.0  --   --   --   --   --   --   HGB 8.4*   < > 7.1* 7.1* 6.4* 9.0* 8.3*  HCT 27.0*   < > 22.9* 22.4* 20.4* 26.2*  25.8*  MCV 92.8   < > 91.6 91.8 91.5 89.4 90.8  PLT 288   < > 260 308 278 299 274   < > = values in this interval not displayed.    Basic Metabolic Panel: Recent Labs  Lab 02/02/22 1825 02/02/22 2146 02/02/22 2258 02/04/22 0206 02/05/22 0353 02/06/22 0720 02/07/22 0406  NA 138  --  138 138 135 135 136  K 4.8  --  4.1 3.8 4.2 3.9 3.7  CL 99  --  100 96* 95* 97* 97*  CO2 24  --  26 27 27 26 24   GLUCOSE 107*  --  102* 94 108* 90 94  BUN 19  --  19 22 33* 29* 37*  CREATININE 6.01*   < > 6.18* 5.49* 8.30* 7.13* 9.21*  CALCIUM 8.3*  --  8.4* 8.4* 8.6* 8.8* 8.8*  MG 2.0  --  1.8 1.5*  --   --   --   PHOS 3.8  --  4.7* 4.2  --   --   --    < > = values in this interval not displayed.    GFR: Estimated Creatinine Clearance: 7.9 mL/min (A) (by C-G formula based on SCr of 9.21 mg/dL (H)).  Liver Function Tests: Recent Labs  Lab 02/02/22 1825  AST 117*  ALT 33  ALKPHOS 89  BILITOT 1.1  PROT 5.4*  ALBUMIN 2.0*    No results for input(s): "LIPASE", "AMYLASE" in the last 168 hours. No results for input(s): "AMMONIA" in the last 168 hours. Coagulation Profile: Recent Labs  Lab 02/02/22 1712  INR 1.3*    Cardiac Enzymes: No results for input(s): "CKTOTAL", "CKMB", "CKMBINDEX", "TROPONINI" in the last 168 hours. BNP (last 3 results) No results for input(s): "PROBNP" in the last 8760 hours. HbA1C: No results for input(s): "HGBA1C" in the last 72 hours.  CBG: Recent Labs  Lab 02/06/22 1120 02/06/22 1620 02/06/22 2202 02/07/22 0727 02/07/22 1232  GLUCAP 100* 92 111* 92 101*    Lipid Profile: No results for input(s): "CHOL", "HDL", "LDLCALC", "TRIG", "CHOLHDL", "LDLDIRECT" in the last 72 hours. Thyroid Function Tests: No results for input(s): "TSH", "T4TOTAL", "FREET4", "T3FREE", "THYROIDAB" in the last 72 hours.  Anemia Panel: No results for input(s): "VITAMINB12", "FOLATE", "FERRITIN", "TIBC", "IRON", "RETICCTPCT" in the last 72 hours. Sepsis Labs: Recent Labs   Lab 02/02/22 2146 02/02/22 2258 02/03/22 0127  PROCALCITON 3.44  --   --   LATICACIDVEN 2.3* 1.8 1.7     Recent Results (from the past 240 hour(s))  Blood Culture (routine x 2)     Status: None   Collection Time: 02/02/22  5:12 PM   Specimen: BLOOD  Result Value Ref Range Status   Specimen Description BLOOD RIGHT ANTECUBITAL  Final   Special Requests   Final    BOTTLES DRAWN AEROBIC AND ANAEROBIC Blood Culture results may not be optimal due to an inadequate volume of blood received in culture bottles   Culture   Final    NO GROWTH 5 DAYS Performed at [facility], [place] [address] [place], [place] [address]    Report Status 02/07/2022 FINAL  Final  Blood Culture (routine x 2)     Status: None   Collection Time: 02/02/22  5:17 PM   Specimen: BLOOD RIGHT FOREARM  Result Value Ref Range Status   Specimen Description BLOOD RIGHT FOREARM  Final   Special Requests   Final    BOTTLES DRAWN AEROBIC AND ANAEROBIC Blood Culture results may not be optimal due to an inadequate volume of blood received in culture bottles   Culture   Final    NO GROWTH 5 DAYS Performed at [facility], [place] [address], [place], [place] [address]    Report Status 02/07/2022 FINAL  Final  MRSA Next Gen by PCR, Nasal     Status: None   Collection Time: 02/02/22  9:21 PM   Specimen: Nasal Mucosa; Nasal Swab  Result Value Ref Range Status   MRSA by PCR Next Gen NOT DETECTED NOT DETECTED Final    Comment: (NOTE) The GeneXpert MRSA Assay (FDA approved for NASAL specimens only), is one component of a comprehensive MRSA colonization surveillance program. It is not intended to diagnose MRSA infection nor to guide or monitor treatment for MRSA infections. Test performance is not FDA approved in patients less than 30 years old. Performed at [facility], [place] [address], [place], [place] [address]          Radiology Studies: No results found.      Scheduled Meds:  (feeding  supplement) PROSource Plus  30 mL Oral BID BM   sodium chloride   Intravenous Once   apixaban  5 mg Oral BID  atorvastatin  40 mg Oral QHS   calcitRIOL  0.25 mcg Oral Q T,Th,Sa-HD   Chlorhexidine Gluconate Cloth  6 each Topical Q0600   Chlorhexidine Gluconate Cloth  6 each Topical Q0600   Chlorhexidine Gluconate Cloth  6 each Topical Q0600   darbepoetin (ARANESP) injection - DIALYSIS  150 mcg Intravenous Q Thu-HD   docusate sodium  100 mg Oral BID   feeding supplement (NEPRO CARB STEADY)  237 mL Oral TID BM   insulin aspart  0-5 Units Subcutaneous QHS   insulin aspart  0-6 Units Subcutaneous TID WC   midodrine  10 mg Oral Q T,Th,Sa-HD   multivitamin  1 tablet Oral QHS   sevelamer carbonate  1,600 mg Oral TID WC   Continuous Infusions:  sodium chloride Stopped (02/02/22 2322)    LOS: 5 days   Time spent: 48min  Athalia Setterlund C Liandro Thelin, DO [facility]  If 7PM-7AM, please contact night-coverage [email or web address]  02/07/2022, 3:46 PM

## 2022-02-07 NOTE — Procedures (Signed)
Seen and examined on dialysis.  Blood pressure 126/68 and HR 86.  RIJ tunn catheter in use.  Tolerating goal.  Procedure supervised.   Claudia Desanctis, MD 02/07/2022  10:23 AM

## 2022-02-07 NOTE — Plan of Care (Signed)
  Problem: Clinical Measurements: Goal: Respiratory complications will improve Outcome: Progressing   Problem: Pain Managment: Goal: General experience of comfort will improve Outcome: Progressing   Problem: Safety: Goal: Ability to remain free from injury will improve Outcome: Progressing   Problem: Skin Integrity: Goal: Risk for impaired skin integrity will decrease Outcome: Progressing   

## 2022-02-07 NOTE — Progress Notes (Signed)
Pt returned to 5M17 via bed from dialysis.

## 2022-02-07 NOTE — Progress Notes (Signed)
PT Cancellation Note  Patient Details Name: Tanner Rollins MRN: [id number] DOB: April 01, 1951   Cancelled Treatment:    Reason Eval/Treat Not Completed: (P) Patient at procedure or test/unavailable Pt is off floor for HD. PT will follow back for treatment this afternoon as able.  [name] B. [name] PT, DPT Acute Rehabilitation Services Please use secure chat or  Call Office [phone]    [place] 02/07/2022, 9:27 AM

## 2022-02-07 NOTE — Progress Notes (Signed)
PT Cancellation Note  Patient Details Name: Tanner Rollins MRN: [id number] DOB: Jul 18, 1951   Cancelled Treatment:    Reason Eval/Treat Not Completed: (P) Patient at procedure or test/unavailable Pt continues to be off floor in HD. PT will follow back for Evaluation tomorrow.  Eudelia Hiltunen B. [name] PT, DPT Acute Rehabilitation Services Please use secure chat or  Call Office [phone]    [place] 02/07/2022, 2:43 PM

## 2022-02-08 DIAGNOSIS — J9601 Acute respiratory failure with hypoxia: Secondary | ICD-10-CM

## 2022-02-08 LAB — CBC
HCT: 28 % — ABNORMAL LOW (ref 39.0–52.0)
Hemoglobin: 8.9 g/dL — ABNORMAL LOW (ref 13.0–17.0)
MCH: 29.3 pg (ref 26.0–34.0)
MCHC: 31.8 g/dL (ref 30.0–36.0)
MCV: 92.1 fL (ref 80.0–100.0)
Platelets: 269 10*3/uL (ref 150–400)
RBC: 3.04 MIL/uL — ABNORMAL LOW (ref 4.22–5.81)
RDW: 15.9 % — ABNORMAL HIGH (ref 11.5–15.5)
WBC: 8.7 10*3/uL (ref 4.0–10.5)
nRBC: 0.2 % (ref 0.0–0.2)

## 2022-02-08 LAB — BASIC METABOLIC PANEL
Anion gap: 12 (ref 5–15)
BUN: 24 mg/dL — ABNORMAL HIGH (ref 8–23)
CO2: 27 mmol/L (ref 22–32)
Calcium: 8.6 mg/dL — ABNORMAL LOW (ref 8.9–10.3)
Chloride: 97 mmol/L — ABNORMAL LOW (ref 98–111)
Creatinine, Ser: 6.55 mg/dL — ABNORMAL HIGH (ref 0.61–1.24)
GFR, Estimated: 9 mL/min — ABNORMAL LOW (ref >60)
Glucose, Bld: 93 mg/dL (ref 70–99)
Potassium: 3.8 mmol/L (ref 3.5–5.1)
Sodium: 136 mmol/L (ref 135–145)

## 2022-02-08 LAB — GLUCOSE, CAPILLARY
Glucose-Capillary: 85 mg/dL (ref 70–99)
Glucose-Capillary: 102 mg/dL — ABNORMAL HIGH (ref 70–99)
Glucose-Capillary: 97 mg/dL (ref 70–99)
Glucose-Capillary: 107 mg/dL — ABNORMAL HIGH (ref 70–99)

## 2022-02-08 NOTE — Evaluation (Signed)
Occupational Therapy Evaluation Patient Details Name: Tanner Rollins MRN: [id number] DOB: 10-11-1950 Today's Date: 02/08/2022   History of Present Illness 71 y/o male presenting from [facility] SNF after arriving at HD lethargic and hypotensive w/ SBP in 80s and HR 90s-130s admitted to ICU. Treated for septic shock complicated by polypharmacy and complicated anemia. At SNF s/p 6/7-6/15 hospitalization for new onset of A-fib RVR and initiation of HD. PMH: ESRD, HTN, Afib with RVR, prior respiratory failure, metabolic bone disease.   Clinical Impression   Pt is currently min assist level for selfcare tasks and sit to stand as well as for functional transfers around to the recliner without an assistive device.  Oxygen sats decreasing down to 88% on room air with selfcare tasks this session increasing up to 93-94% on 2Ls O2.  Increased swelling and pain noted in the LUE which limits AROM at the elbow and shoulder as well as functional use.  Pt will benefit from acute care OT to help increase strength and ADL performance.  Recommend continued rehab short term SNF until back to independent level since he lives alone.      Recommendations for follow up therapy are one component of a multi-disciplinary discharge planning process, led by the attending physician.  Recommendations may be updated based on patient status, additional functional criteria and insurance authorization.   Follow Up Recommendations  Skilled nursing-short term rehab (<3 hours/day)    Assistance Recommended at Discharge Frequent or constant Supervision/Assistance  Patient can return home with the following A little help with bathing/dressing/bathroom;A little help with walking and/or transfers;Assistance with cooking/housework;Assist for transportation;Direct supervision/assist for medications management;Help with stairs or ramp for entrance;Direct supervision/assist for financial management    Functional Status Assessment  Patient  has had a recent decline in their functional status and demonstrates the ability to make significant improvements in function in a reasonable and predictable amount of time.  Equipment Recommendations  None recommended by OT       Precautions / Restrictions Precautions Precautions: Fall Restrictions Weight Bearing Restrictions: No      Mobility Bed Mobility Overal bed mobility: Needs Assistance Bed Mobility: Supine to Sit     Supine to sit: Supervision          Transfers Overall transfer level: Needs assistance Equipment used: None Transfers: Sit to/from Stand, Bed to chair/wheelchair/BSC Sit to Stand: Min assist     Step pivot transfers: Min assist     General transfer comment: Min assist for transfer over to the recliner without any assistive device.      Balance Overall balance assessment: Needs assistance Sitting-balance support: No upper extremity supported, Feet supported Sitting balance-Leahy Scale: Fair     Standing balance support: No upper extremity supported, During functional activity Standing balance-Leahy Scale: Poor Standing balance comment: Pt needs UE support for balance with standing and mobility                           ADL either performed or assessed with clinical judgement   ADL Overall ADL's : Needs assistance/impaired Eating/Feeding: Independent;Sitting   Grooming: Wash/dry hands;Wash/dry face;Set up;Sitting   Upper Body Bathing: Supervision/ safety;Sitting   Lower Body Bathing: Minimal assistance;Sit to/from stand       Lower Body Dressing: Maximal assistance Lower Body Dressing Details (indicate cue type and reason): for gripper socks secondary to not being able to efficiently use the LUE without pain when attempted Toilet Transfer: Minimal assistance (no device) Toilet Transfer  Details (indicate cue type and reason): simulated as pt declined need to toilet Toileting- Clothing Manipulation and Hygiene: Minimal  assistance;Sit to/from stand Toileting - Clothing Manipulation Details (indicate cue type and reason): simulated     Functional mobility during ADLs: Minimal assistance (no device) General ADL Comments: Pt with increaased fatigue but no dizziness.  BP in sitting 135/88 with HR in the 60's.  HR increased up to 104 with standing and transfer to the bedside recliner.  Oxygen sats 94% on 2Ls nasal cannula at rest, decreasing to 90% at rest on room air and to 88% while working on selfcare tasks in sitting on room air.     Vision Baseline Vision/History: 0 No visual deficits Ability to See in Adequate Light: 0 Adequate Patient Visual Report: No change from baseline Vision Assessment?: No apparent visual deficits     Perception Perception Perception: Within Functional Limits   Praxis Praxis Praxis: Intact    Pertinent Vitals/Pain Pain Assessment Pain Assessment: (P) No/denies pain     Hand Dominance     Extremity/Trunk Assessment Upper Extremity Assessment Upper Extremity Assessment: LUE deficits/detail LUE Deficits / Details: Increased swelling noted in the left arm proximal and distal to the elbow.  AROM elbow flexion -15-130 degrees with some pain.  Shoulder flexion AROM 0-100 degrees with some shoulder pain.  Strength 3-/5 for the elbow and shoulder with 4/5 for grip strength LUE: Shoulder pain with ROM LUE Sensation: WNL LUE Coordination: decreased gross motor   Lower Extremity Assessment Lower Extremity Assessment: Defer to PT evaluation   Cervical / Trunk Assessment Cervical / Trunk Assessment: Normal   Communication     Cognition Arousal/Alertness: Awake/alert Behavior During Therapy: WFL for tasks assessed/performed Overall Cognitive Status: Within Functional Limits for tasks assessed                                 General Comments: Pt demonstrates intellectual and emergent awareness as well as orientation.                Home Living  Family/patient expects to be discharged to:: Skilled nursing facility                                                OT Problem List: Decreased strength;Decreased activity tolerance;Impaired balance (sitting and/or standing);Cardiopulmonary status limiting activity      OT Treatment/Interventions: Self-care/ADL training;Patient/family education;Therapeutic exercise;Balance training;Therapeutic activities;DME and/or AE instruction    OT Goals(Current goals can be found in the care plan section) Acute Rehab OT Goals Patient Stated Goal: Pt wants to get stronger and feel better OT Goal Formulation: With patient Time For Goal Achievement: 02/22/22 Potential to Achieve Goals: Good  OT Frequency: Min 2X/week       AM-PAC OT "6 Clicks" Daily Activity     Outcome Measure Help from another person eating meals?: None Help from another person taking care of personal grooming?: A Little Help from another person toileting, which includes using toliet, bedpan, or urinal?: A Little Help from another person bathing (including washing, rinsing, drying)?: A Little Help from another person to put on and taking off regular upper body clothing?: A Little Help from another person to put on and taking off regular lower body clothing?: A Lot 6 Click Score: 18   End of  Session Nurse Communication: Mobility status  Activity Tolerance: Patient limited by fatigue Patient left: in chair;with call bell/phone within reach;with chair alarm set  OT Visit Diagnosis: Unsteadiness on feet (R26.81);Muscle weakness (generalized) (M62.81)                Time: 6063-0160 OT Time Calculation (min): 35 min Charges:  OT General Charges $OT Visit: 1 Visit OT Evaluation $OT Eval Moderate Complexity: 1 Mod OT Treatments $Self Care/Home Management : 8-22 mins Uva Runkel OTR/L 02/08/2022, 10:08 AM

## 2022-02-08 NOTE — [place] FL2 (Signed)
[place] MEDICAID FL2 LEVEL OF CARE SCREENING TOOL     IDENTIFICATION  Patient Name: Tanner Rollins Birthdate: May 28, 1951 Sex: male Admission Date (Current Location): 02/02/2022  [facility] and [place] Number:  Producer, television/film/video and Address:  [facility] [place], [address], [place], [place] [address]      Provider Number: [id number]  Attending Physician Name and Address:  Lanae Boast, MD  Relative Name and Phone Number:  harlyn, sowa (Sister)   [phone]    Current Level of Care: Hospital Recommended Level of Care: Skilled Nursing Facility Prior Approval Number:    Date Approved/Denied:   PASRR Number: [id number]  Discharge Plan: SNF    Current Diagnoses: Patient Active Problem List   Diagnosis Date Noted   Acute respiratory failure with hypoxia (HCC) 02/08/2022   Protein-calorie malnutrition, severe 02/04/2022   Shock (HCC) 02/02/2022   ESRD (end stage renal disease) on dialysis (HCC) 02/02/2022   A-fib (HCC) 02/02/2022   Metabolic bone disease 02/02/2022   Pleural effusion 02/02/2022   Lethargy 02/02/2022   High anion gap metabolic acidosis    Hypertensive emergency 01/23/2022   Chest pain    AKI (acute kidney injury) (HCC)    CKD (chronic kidney disease) stage 4, GFR 15-29 ml/min (HCC) 03/12/2021   Enlarged prostate without urinary obstruction 03/12/2021   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   Severe hypertension 03/11/2021    Orientation RESPIRATION BLADDER Height & Weight     Self, Time, Situation, Place  O2 (2L) Continent Weight: 162 lb 11.2 oz (73.8 kg) Height:  5' 11.5" (181.6 cm)  BEHAVIORAL SYMPTOMS/MOOD NEUROLOGICAL BOWEL NUTRITION STATUS      Continent Diet (see d/c summary)  AMBULATORY STATUS COMMUNICATION OF NEEDS Skin   Extensive Assist Verbally Surgical wounds                       Personal Care Assistance Level of Assistance  Bathing, Feeding, Dressing Bathing Assistance: Limited assistance Feeding  assistance: Limited assistance Dressing Assistance: Limited assistance     Functional Limitations Info  Sight, Hearing, Speech Sight Info: Adequate Hearing Info: Adequate Speech Info: Adequate    SPECIAL CARE FACTORS FREQUENCY  PT (By licensed PT), OT (By licensed OT)     PT Frequency: 5x/ week OT Frequency: 5x/ week            Contractures Contractures Info: Not present    Additional Factors Info  Code Status, Allergies, Insulin Sliding Scale Code Status Info: Full Allergies Info: NKA   Insulin Sliding Scale Info: see d/c medication list       Current Medications (02/08/2022):  This is the current hospital active medication list Current Facility-Administered Medications  Medication Dose Route Frequency Provider Last Rate Last Admin   (feeding supplement) PROSource Plus liquid 30 mL  30 mL Oral BID BM Pola Corn, NP   30 mL at 02/08/22 1146   0.9 %  sodium chloride infusion (Manually program via Guardrails IV Fluids)   Intravenous Once Azucena Fallen, MD       0.9 %  sodium chloride infusion   Intravenous PRN Leslye Peer, MD   Stopped at 02/02/22 2322   acetaminophen (TYLENOL) tablet 650 mg  650 mg Oral Q6H PRN Azucena Fallen, MD   650 mg at 02/07/22 2142   apixaban (ELIQUIS) tablet 5 mg  5 mg Oral BID Leslye Peer, MD   5 mg at 02/08/22 1147   atorvastatin (LIPITOR)  tablet 40 mg  40 mg Oral QHS Leslye Peer, MD   40 mg at 02/07/22 2142   calcitRIOL (ROCALTROL) capsule 0.25 mcg  0.25 mcg Oral Q T,Th,Sa-HD Pola Corn, NP   0.25 mcg at 02/07/22 1454   Chlorhexidine Gluconate Cloth 2 % PADS 6 each  6 each Topical Q0600 Estanislado Emms, MD   6 each at 02/08/22 0547   Darbepoetin Alfa (ARANESP) injection 150 mcg  150 mcg Intravenous Q Thu-HD Leslye Peer, MD   150 mcg at 02/07/22 1221   docusate sodium (COLACE) capsule 100 mg  100 mg Oral BID PRN Leslye Peer, MD       docusate sodium (COLACE) capsule 100 mg  100 mg Oral BID Leslye Peer, MD   100 mg at 02/08/22 1147   feeding supplement (NEPRO CARB STEADY) liquid 237 mL  237 mL Oral TID BM Azucena Fallen, MD   237 mL at 02/08/22 1145   insulin aspart (novoLOG) injection 0-5 Units  0-5 Units Subcutaneous QHS Leslye Peer, MD       insulin aspart (novoLOG) injection 0-6 Units  0-6 Units Subcutaneous TID WC Byrum, Les Pou, MD       midodrine (PROAMATINE) tablet 10 mg  10 mg Oral Q T,Th,Sa-HD Pola Corn, NP   10 mg at 02/07/22 0855   multivitamin (RENA-VIT) tablet 1 tablet  1 tablet Oral QHS Azucena Fallen, MD   1 tablet at 02/07/22 2142   Oral care mouth rinse  15 mL Mouth Rinse PRN Leslye Peer, MD       polyethylene glycol (MIRALAX / GLYCOLAX) packet 17 g  17 g Oral Daily PRN Leslye Peer, MD       sevelamer carbonate (RENVELA) tablet 1,600 mg  1,600 mg Oral TID WC Pola Corn, NP   1,600 mg at 02/08/22 1217     Discharge Medications: Please see discharge summary for a list of discharge medications.  Relevant Imaging Results:  Relevant Lab Results:   Additional Information SS# [id number] [facility] on TTS 10:55 chair time  [employer], LCSWA

## 2022-02-08 NOTE — Hospital Course (Addendum)
Tanner Rollins is a 71 y.o. male [employer] veteran with past medical history significant for HTN, CKD stage IV, paroxysmal atrial fibrillation, anemia of chronic renal disease who presented to [facility] ED from his outpatient dialysis unit on 6/17 with hypotension, lethargy and confusion.  Patient was noted to have SBP's in the 80s with HR ranging 90s-130s.  Recently discharged to [facility] on 6/15 with new start of hemodialysis.  Patient was initially admitted to the critical care service given need for vasopressors as patient's ESRD with HD would not be able to tolerate massive volume resuscitation.  Patient was titrated off of vasopressors and transferred to the hospitalist service on 6/19.   Timeline:  6/19: blood pressure drastically improving without needing pressors, transitioned to hospitalist service 6/20: moderate anemia, 6.4 requiring transfusion with dialysis, hypotension resolving 6/21: mental status hypotension and anemia resolving, not yet back to baseline but improving with antibiotics, supportive care and discontinuation of heart rate medications as above 6/22: Remains somewhat somnolent after dialysis, weak, unable to participate with PT/interview very well. 6/23: working with PT did not tolerate well became hypotensive and hypoxic in 87- back on [place]- able to use bedside chair 6/24: He went into A-fib with RVR during dialysis.  Hemodialysis discontinued.  Cardiology consulted 6/25: Continues to have pleuritic chest pain 6/26: Tolerating HD, cardiology now signed off 6/27: s/p left thoracentesis 6/28: plan to return to SNF

## 2022-02-09 DIAGNOSIS — I480 Paroxysmal atrial fibrillation: Secondary | ICD-10-CM

## 2022-02-09 DIAGNOSIS — R079 Chest pain, unspecified: Secondary | ICD-10-CM

## 2022-02-09 LAB — RENAL FUNCTION PANEL
Albumin: 1.9 g/dL — ABNORMAL LOW (ref 3.5–5.0)
Anion gap: 14 (ref 5–15)
BUN: 35 mg/dL — ABNORMAL HIGH (ref 8–23)
CO2: 25 mmol/L (ref 22–32)
Calcium: 8.9 mg/dL (ref 8.9–10.3)
Chloride: 97 mmol/L — ABNORMAL LOW (ref 98–111)
Creatinine, Ser: 8.73 mg/dL — ABNORMAL HIGH (ref 0.61–1.24)
GFR, Estimated: 6 mL/min — ABNORMAL LOW (ref >60)
Glucose, Bld: 96 mg/dL (ref 70–99)
Phosphorus: 4.8 mg/dL — ABNORMAL HIGH (ref 2.5–4.6)
Potassium: 4.1 mmol/L (ref 3.5–5.1)
Sodium: 136 mmol/L (ref 135–145)

## 2022-02-09 LAB — TROPONIN I (HIGH SENSITIVITY)
Troponin I (High Sensitivity): 40 ng/L — ABNORMAL HIGH (ref <18)
Troponin I (High Sensitivity): 39 ng/L — ABNORMAL HIGH (ref <18)

## 2022-02-09 LAB — CBC
HCT: 28.4 % — ABNORMAL LOW (ref 39.0–52.0)
Hemoglobin: 9.1 g/dL — ABNORMAL LOW (ref 13.0–17.0)
MCH: 29.5 pg (ref 26.0–34.0)
MCHC: 32 g/dL (ref 30.0–36.0)
MCV: 92.2 fL (ref 80.0–100.0)
Platelets: 260 10*3/uL (ref 150–400)
RBC: 3.08 MIL/uL — ABNORMAL LOW (ref 4.22–5.81)
RDW: 15.6 % — ABNORMAL HIGH (ref 11.5–15.5)
WBC: 8.5 10*3/uL (ref 4.0–10.5)
nRBC: 0 % (ref 0.0–0.2)

## 2022-02-09 LAB — GLUCOSE, CAPILLARY
Glucose-Capillary: 90 mg/dL (ref 70–99)
Glucose-Capillary: 99 mg/dL (ref 70–99)
Glucose-Capillary: 106 mg/dL — ABNORMAL HIGH (ref 70–99)
Glucose-Capillary: 113 mg/dL — ABNORMAL HIGH (ref 70–99)
Glucose-Capillary: 112 mg/dL — ABNORMAL HIGH (ref 70–99)

## 2022-02-09 LAB — MAGNESIUM: Magnesium: 2 mg/dL (ref 1.7–2.4)

## 2022-02-09 MED ORDER — SODIUM CHLORIDE 0.9 % IV SOLN
250.0000 mg | Freq: Once | INTRAVENOUS | Status: AC
  Administered 2022-02-09: 250 mg via INTRAVENOUS
  Filled 2022-02-09: qty 20

## 2022-02-09 MED ORDER — DILTIAZEM HCL ER COATED BEADS 180 MG PO CP24
180.0000 mg | ORAL_CAPSULE | Freq: Every day | ORAL | Status: DC
  Administered 2022-02-09 – 2022-02-13 (×5): 180 mg via ORAL
  Filled 2022-02-09 (×5): qty 1

## 2022-02-09 MED ORDER — SIMETHICONE 80 MG PO CHEW
160.0000 mg | CHEWABLE_TABLET | Freq: Four times a day (QID) | ORAL | Status: DC | PRN
  Administered 2022-02-10 – 2022-02-13 (×3): 160 mg via ORAL
  Filled 2022-02-09 (×4): qty 2

## 2022-02-09 MED ORDER — TRAMADOL HCL 50 MG PO TABS
50.0000 mg | ORAL_TABLET | Freq: Two times a day (BID) | ORAL | Status: DC | PRN
  Administered 2022-02-09 – 2022-02-12 (×3): 50 mg via ORAL
  Filled 2022-02-09 (×3): qty 1

## 2022-02-09 MED ORDER — METOPROLOL TARTRATE 5 MG/5ML IV SOLN: 5.0000 mg | Freq: Once | INTRAVENOUS | Status: AC

## 2022-02-09 MED ORDER — METOPROLOL TARTRATE 5 MG/5ML IV SOLN
INTRAVENOUS | Status: AC
  Administered 2022-02-09: 5 mg via INTRAVENOUS
  Filled 2022-02-09: qty 5

## 2022-02-09 MED ORDER — AMIODARONE HCL 200 MG PO TABS
200.0000 mg | ORAL_TABLET | Freq: Two times a day (BID) | ORAL | Status: DC
Start: 2022-02-09 — End: 2022-02-13
  Administered 2022-02-09 – 2022-02-13 (×9): 200 mg via ORAL
  Filled 2022-02-09 (×9): qty 1

## 2022-02-09 NOTE — Progress Notes (Signed)
PROGRESS NOTE    Tanner Rollins  ONG:[id number] DOB: 1951/02/18 DOA: 02/02/2022 PCP: Pcp, No   Brief Narrative:  71 year old male pt who was just discharged to SNF on 01/31/22 after being admitted for CP/HTN and Af w/ RVR. During this stay he was started on CCB, started HD and was eventually discharged to SNF w/ plans to cont rehab efforts and iHD.Patient presented to hospital from dialysis with lethargy hypotension and tachycardia.  Patient was admitted transiently to the ICU for hypotension given concern for need for pressors given end-stage renal dysfunction and dialysis and would be unable to tolerate massive volume resuscitation.  Timelines:  6/19 - blood pressure drastically improving without needing pressors, transitioned to hospitalist service 6/20 -moderate anemia, 6.4 requiring transfusion with dialysis, hypotension resolving 6/21 -mental status hypotension and anemia resolving, not yet back to baseline but improving with antibiotics, supportive care and discontinuation of heart rate medications as above 6/22 -Remains somewhat somnolent after dialysis, weak, unable to participate with PT/interview very wel. 6/23-working with PT did not tolerate well became hypotensive and hypoxic in 87- back on [place]- able to use bedside chair  Assessment & Plan:   Septic shock complicated by polypharmacy Symptomatic hypotension and bradycardia: -Admitted with hypotension and bradycardia with respiratory symptoms concerning for pneumonia with elevated procalcitonin.  Also contributing by calcium channel blocker beta-blocker.  Completed 5 days of antibiotics. -Continue midodrine TTS.  A-fib with RVR: -While patient in hemodialysis-his heart rate trended up in 140s and blood pressure noted to be 103/76.  Hemodialysis discontinued.  He got only 30 minutes of dialysis today.  EKG shows A-fib with RVR.  Lopressor 5 mg IV once given -Consulted cardiology-Dr. Anne Fu- Appreciate help -Continue Eliquis.   Monitor closely on telemetry.  Check magnesium level.  acute on chronic anemia of chronic disease: -S/P 2 units PRBC.  Hemoglobin up 9.1 g from 6.4. On weekly ESA, and completed iron load.   Acute encephalopathy likely metabolic versus toxic encephalopathy with lethargy:  -Still having generalized weakness overall mental status is stable AOx4. -Continue PT OT   ESRD on HD TTS, with metabolic bone disease: TDC in place.  Nephrology on board appreciate input   Bilateral pleural effusion with mild pulmonary edema Acute respiratory failure with hypoxia: - oxygen saturation dropped in 88% on room air w/ PT on 6/23 -Likely in the setting of pneumonia and volume overload  -Continue [place] 02, wean as tolerated.  Hyperlipidemia: Continue atorvastatin   GMW:NUUVOZD to be improving slowly, previously DNR in July 2022.Remains full code for now encourage discussion with family.  DVT prophylaxis: Eliquis Code Status: Full code Family Communication:  None present at bedside.  Plan of care discussed with patient in length and he verbalized understanding and agreed with it. Disposition Plan: To be determined  Consultants:  Nephrology Cardiology  Status is: Inpatient    Subjective: Patient seen and examined in hemodialysis.  Resting comfortably on the bed.  Reports persistent left-sided chest pain for the past couple of days aggravates with certain movements.  Denies association with shortness of breath, palpitations, diaphoresis.   Objective: Vitals:   02/09/22 0618 02/09/22 0830 02/09/22 0850 02/09/22 0904  BP: (!) 129/92  (!) 152/91 (!) 153/98  Pulse: 88  91 93  Resp: 17  (!) 22 (!) 22  Temp: 97.6 F (36.4 C)  97.8 F (36.6 C)   TempSrc: Oral  Axillary   SpO2: 97%  97% 96%  Weight:  73.5 kg    Height:  Intake/Output Summary (Last 24 hours) at 02/09/2022 1028 Last data filed at 02/09/2022 0904 Gross per 24 hour  Intake 700 ml  Output 400 ml  Net 300 ml   Filed Weights    02/07/22 0944 02/08/22 0500 02/09/22 0830  Weight: 74.2 kg 73.8 kg 73.5 kg    Examination:  General exam: Appears calm and comfortable, on nasal cannula, appears weak and lethargic Respiratory system: Decreased breath sounds on the bases.  No wheezing, rhonchi or crackles.  No use of accessory muscles. Cardiovascular system: S1 & S2 heard, RRR. No JVD, murmurs, rubs, gallops or clicks. No pedal edema. Gastrointestinal system: Abdomen is nondistended, soft and nontender. No organomegaly or masses felt. Normal bowel sounds heard. Central nervous system: Alert and oriented. No focal neurological deficits. Extremities: Symmetric 5 x 5 power. Skin: No rashes, lesions or ulcers  Data Reviewed: I have personally reviewed following labs and imaging studies  CBC: Recent Labs  Lab 02/02/22 1712 02/02/22 1801 02/05/22 0353 02/06/22 0720 02/07/22 0406 02/08/22 0315 02/09/22 0138  WBC 8.6   < > 9.7 12.0* 10.0 8.7 8.5  NEUTROABS 6.0  --   --   --   --   --   --   HGB 8.4*   < > 6.4* 9.0* 8.3* 8.9* 9.1*  HCT 27.0*   < > 20.4* 26.2* 25.8* 28.0* 28.4*  MCV 92.8   < > 91.5 89.4 90.8 92.1 92.2  PLT 288   < > 278 299 274 269 260   < > = values in this interval not displayed.   Basic Metabolic Panel: Recent Labs  Lab 02/02/22 1825 02/02/22 2146 02/02/22 2258 02/04/22 0206 02/05/22 0353 02/06/22 0720 02/07/22 0406 02/08/22 0315 02/09/22 0138  NA 138  --  138 138 135 135 136 136 136  K 4.8  --  4.1 3.8 4.2 3.9 3.7 3.8 4.1  CL 99  --  100 96* 95* 97* 97* 97* 97*  CO2 24  --  26 27 27 26 24 27 25   GLUCOSE 107*  --  102* 94 108* 90 94 93 96  BUN 19  --  19 22 33* 29* 37* 24* 35*  CREATININE 6.01*   < > 6.18* 5.49* 8.30* 7.13* 9.21* 6.55* 8.73*  CALCIUM 8.3*  --  8.4* 8.4* 8.6* 8.8* 8.8* 8.6* 8.9  MG 2.0  --  1.8 1.5*  --   --   --   --   --   PHOS 3.8  --  4.7* 4.2  --   --   --   --  4.8*   < > = values in this interval not displayed.   GFR: Estimated Creatinine Clearance: 8.3  mL/min (A) (by C-G formula based on SCr of 8.73 mg/dL (H)). Liver Function Tests: Recent Labs  Lab 02/02/22 1825 02/09/22 0138  AST 117*  --   ALT 33  --   ALKPHOS 89  --   BILITOT 1.1  --   PROT 5.4*  --   ALBUMIN 2.0* 1.9*   No results for input(s): "LIPASE", "AMYLASE" in the last 168 hours. No results for input(s): "AMMONIA" in the last 168 hours. Coagulation Profile: Recent Labs  Lab 02/02/22 1712  INR 1.3*   Cardiac Enzymes: No results for input(s): "CKTOTAL", "CKMB", "CKMBINDEX", "TROPONINI" in the last 168 hours. BNP (last 3 results) No results for input(s): "PROBNP" in the last 8760 hours. HbA1C: No results for input(s): "HGBA1C" in the last 72 hours. CBG: Recent Labs  Lab 02/08/22 0739 02/08/22 1119 02/08/22 1649 02/08/22 2039 02/09/22 0727  GLUCAP 85 102* 97 107* 90   Lipid Profile: No results for input(s): "CHOL", "HDL", "LDLCALC", "TRIG", "CHOLHDL", "LDLDIRECT" in the last 72 hours. Thyroid Function Tests: No results for input(s): "TSH", "T4TOTAL", "FREET4", "T3FREE", "THYROIDAB" in the last 72 hours. Anemia Panel: No results for input(s): "VITAMINB12", "FOLATE", "FERRITIN", "TIBC", "IRON", "RETICCTPCT" in the last 72 hours. Sepsis Labs: Recent Labs  Lab 02/02/22 2146 02/02/22 2258 02/03/22 0127  PROCALCITON 3.44  --   --   LATICACIDVEN 2.3* 1.8 1.7    Recent Results (from the past 240 hour(s))  Blood Culture (routine x 2)     Status: None   Collection Time: 02/02/22  5:12 PM   Specimen: BLOOD  Result Value Ref Range Status   Specimen Description BLOOD RIGHT ANTECUBITAL  Final   Special Requests   Final    BOTTLES DRAWN AEROBIC AND ANAEROBIC Blood Culture results may not be optimal due to an inadequate volume of blood received in culture bottles   Culture   Final    NO GROWTH 5 DAYS Performed at [facility], [address], [place], [place] [address]    Report Status 02/07/2022 FINAL  Final  Blood Culture (routine x 2)     Status:  None   Collection Time: 02/02/22  5:17 PM   Specimen: BLOOD RIGHT FOREARM  Result Value Ref Range Status   Specimen Description BLOOD RIGHT FOREARM  Final   Special Requests   Final    BOTTLES DRAWN AEROBIC AND ANAEROBIC Blood Culture results may not be optimal due to an inadequate volume of blood received in culture bottles   Culture   Final    NO GROWTH 5 DAYS Performed at [facility], [address], [place], [place] [address]    Report Status 02/07/2022 FINAL  Final  MRSA Next Gen by PCR, Nasal     Status: None   Collection Time: 02/02/22  9:21 PM   Specimen: Nasal Mucosa; Nasal Swab  Result Value Ref Range Status   MRSA by PCR Next Gen NOT DETECTED NOT DETECTED Final    Comment: (NOTE) The GeneXpert MRSA Assay (FDA approved for NASAL specimens only), is one component of a comprehensive MRSA colonization surveillance program. It is not intended to diagnose MRSA infection nor to guide or monitor treatment for MRSA infections. Test performance is not FDA approved in patients less than 71 years old. Performed at [facility], [address], [place], [place] [address]       Radiology Studies: No results found.  Scheduled Meds:  (feeding supplement) PROSource Plus  30 mL Oral BID BM   sodium chloride   Intravenous Once   apixaban  5 mg Oral BID   atorvastatin  40 mg Oral QHS   calcitRIOL  0.25 mcg Oral Q T,Th,Sa-HD   Chlorhexidine Gluconate Cloth  6 each Topical Q0600   darbepoetin (ARANESP) injection - DIALYSIS  150 mcg Intravenous Q Thu-HD   docusate sodium  100 mg Oral BID   feeding supplement (NEPRO CARB STEADY)  237 mL Oral TID BM   insulin aspart  0-5 Units Subcutaneous QHS   insulin aspart  0-6 Units Subcutaneous TID WC   midodrine  10 mg Oral Q T,Th,Sa-HD   multivitamin  1 tablet Oral QHS   sevelamer carbonate  1,600 mg Oral TID WC   Continuous Infusions:  sodium chloride Stopped (02/02/22 2322)  LOS: 7 days   Time spent: 40  minutes   Ebubechukwu Jedlicka Estill Cotta, MD [facility]  If 7PM-7AM, please contact night-coverage [email or web address] 02/09/2022, 10:28 AM

## 2022-02-10 ENCOUNTER — Other Ambulatory Visit: Payer: Self-pay

## 2022-02-10 DIAGNOSIS — N184 Chronic kidney disease, stage 4 (severe): Secondary | ICD-10-CM

## 2022-02-10 LAB — RENAL FUNCTION PANEL
Albumin: 1.8 g/dL — ABNORMAL LOW (ref 3.5–5.0)
Anion gap: 10 (ref 5–15)
BUN: 40 mg/dL — ABNORMAL HIGH (ref 8–23)
CO2: 25 mmol/L (ref 22–32)
Calcium: 8.7 mg/dL — ABNORMAL LOW (ref 8.9–10.3)
Chloride: 98 mmol/L (ref 98–111)
Creatinine, Ser: 9.5 mg/dL — ABNORMAL HIGH (ref 0.61–1.24)
GFR, Estimated: 5 mL/min — ABNORMAL LOW (ref >60)
Glucose, Bld: 99 mg/dL (ref 70–99)
Phosphorus: 5.2 mg/dL — ABNORMAL HIGH (ref 2.5–4.6)
Potassium: 4.1 mmol/L (ref 3.5–5.1)
Sodium: 133 mmol/L — ABNORMAL LOW (ref 135–145)

## 2022-02-10 LAB — CBC
HCT: 26.9 % — ABNORMAL LOW (ref 39.0–52.0)
Hemoglobin: 8.7 g/dL — ABNORMAL LOW (ref 13.0–17.0)
MCH: 29.5 pg (ref 26.0–34.0)
MCHC: 32.3 g/dL (ref 30.0–36.0)
MCV: 91.2 fL (ref 80.0–100.0)
Platelets: 274 10*3/uL (ref 150–400)
RBC: 2.95 MIL/uL — ABNORMAL LOW (ref 4.22–5.81)
RDW: 15.6 % — ABNORMAL HIGH (ref 11.5–15.5)
WBC: 7.9 10*3/uL (ref 4.0–10.5)
nRBC: 0 % (ref 0.0–0.2)

## 2022-02-10 LAB — GLUCOSE, CAPILLARY
Glucose-Capillary: 102 mg/dL — ABNORMAL HIGH (ref 70–99)
Glucose-Capillary: 100 mg/dL — ABNORMAL HIGH (ref 70–99)
Glucose-Capillary: 112 mg/dL — ABNORMAL HIGH (ref 70–99)
Glucose-Capillary: 118 mg/dL — ABNORMAL HIGH (ref 70–99)

## 2022-02-10 MED ORDER — DICLOFENAC SODIUM 1 % EX GEL
2.0000 g | Freq: Four times a day (QID) | CUTANEOUS | Status: DC
Start: 2022-02-10 — End: 2022-02-13
  Administered 2022-02-10 – 2022-02-12 (×2): 2 g via TOPICAL
  Filled 2022-02-10: qty 100

## 2022-02-10 MED ORDER — CHLORHEXIDINE GLUCONATE CLOTH 2 % EX PADS
6.0000 | MEDICATED_PAD | Freq: Every day | CUTANEOUS | Status: DC
  Administered 2022-02-11 – 2022-02-13 (×3): 6 via TOPICAL

## 2022-02-10 NOTE — Progress Notes (Signed)
PROGRESS NOTE    Tanner Rollins  ZOX:[id number] DOB: Jul 11, 1951 DOA: 02/02/2022 PCP: Pcp, No   Brief Narrative:  71 year old male pt who was just discharged to SNF on 01/31/22 after being admitted for CP/HTN and Af w/ RVR. During this stay he was started on CCB, started HD and was eventually discharged to SNF w/ plans to cont rehab efforts and iHD.Patient presented to hospital from dialysis with lethargy hypotension and tachycardia.  Patient was admitted transiently to the ICU for hypotension given concern for need for pressors given end-stage renal dysfunction and dialysis and would be unable to tolerate massive volume resuscitation.  Timelines:  6/19 - blood pressure drastically improving without needing pressors, transitioned to hospitalist service 6/20 -moderate anemia, 6.4 requiring transfusion with dialysis, hypotension resolving 6/21 -mental status hypotension and anemia resolving, not yet back to baseline but improving with antibiotics, supportive care and discontinuation of heart rate medications as above 6/22 -Remains somewhat somnolent after dialysis, weak, unable to participate with PT/interview very wel. 6/23-working with PT did not tolerate well became hypotensive and hypoxic in 87- back on [place]- able to use bedside chair 6/24: He went into A-fib with RVR during dialysis.  Hemodialysis discontinued.  Cardiology consulted 6/25: Continues to have pleuritic chest pain  Assessment & Plan:   Septic shock complicated by polypharmacy Symptomatic hypotension and bradycardia: -Admitted with hypotension and bradycardia with respiratory symptoms concerning for pneumonia with elevated procalcitonin.  Also contributing by calcium channel blocker beta-blocker.  Completed 5 days of antibiotics.  -Continue midodrine TTS.  A-fib with RVR: -On 6/24: While patient in hemodialysis-his heart rate trended up in 140s and blood pressure noted to be 103/76.  Hemodialysis discontinued.  He got only 30  minutes of dialysis. EKG shows A-fib with RVR.  Lopressor 5 mg IV once given -Consulted cardiology recommend to hold beta-blocker/calcium channel blocker due to soft blood pressure.  Currently on amiodarone 200 mg twice a day for 2 weeks and then 200 mg once daily.  Appreciate cardiology help. -Continue Eliquis  Chest pain: Likely musculoskeletal in origin. -Troponin flat.  EKG: Unremarkable. -Echo from 6/23 shows normal ejection fraction with grade 1 diastolic dysfunction  acute on chronic anemia of chronic disease: -S/P 2 units PRBC.  Hemoglobin ranges between 8-9.  On weekly ESA, and completed iron load.   Acute encephalopathy likely metabolic versus toxic encephalopathy with lethargy:  -Still having generalized weakness overall mental status is stable AOx4.  -Consulted PT/OT recommended SNF.  TOC consulted   ESRD on HD TTS, with metabolic bone disease: TDC in place.  Nephrology on board appreciate input   Bilateral pleural effusion with mild pulmonary edema Acute respiratory failure with hypoxia: - oxygen saturation dropped in 88% on room air w/ PT on 6/23 -Likely in the setting of pneumonia and volume overload  -Continue [place] 1 to 2 L, wean as tolerated.  Hyperlipidemia: Continue atorvastatin   WJX:BJYNWGN to be improving slowly, previously DNR in July 2022.Remains full code for now.  DVT prophylaxis: Eliquis Code Status: Full code Family Communication:  None present at bedside.  Plan of care discussed with patient in length and he verbalized understanding and agreed with it. Disposition Plan: SNF  Consultants:  Nephrology Cardiology  Status is: Inpatient    Subjective: Patient seen and examined.  He is to have chest pain with deep inspiration and with certain movements.  No shortness of breath or palpitations, nausea, vomiting, headache, blurry vision.  No fever.  No acute events overnight.  Does  not want to eat breakfast.  Objective: Vitals:   02/09/22 2056 02/10/22  0531 02/10/22 0647 02/10/22 0835  BP: 116/78 127/85 134/81 140/87  Pulse: 76 75 75 70  Resp: 18 18 16 19   Temp: 98 F (36.7 C) 98.7 F (37.1 C) 98.3 F (36.8 C) 98.8 F (37.1 C)  TempSrc: Oral Oral Oral Oral  SpO2: 91% 93% 92% 92%  Weight:      Height:        Intake/Output Summary (Last 24 hours) at 02/10/2022 1257 Last data filed at 02/10/2022 0900 Gross per 24 hour  Intake 120 ml  Output --  Net 120 ml    Filed Weights   02/07/22 0944 02/08/22 0500 02/09/22 0830  Weight: 74.2 kg 73.8 kg 73.5 kg    Examination:  General exam: Appears calm and comfortable, on nasal cannula, appears weak and lethargic, appears older than his stated age Respiratory system: Decreased breath sounds on the bases.  No wheezing, rhonchi or crackles.  No use of accessory muscles. Cardiovascular system: S1 & S2 heard, RRR. No JVD, murmurs, rubs, gallops or clicks. No pedal edema. Gastrointestinal system: Abdomen is nondistended, soft and nontender. No organomegaly or masses felt. Normal bowel sounds heard. Central nervous system: Alert and oriented. No focal neurological deficits. Extremities: Symmetric 5 x 5 power. Skin: No rashes, lesions or ulcers  Data Reviewed: I have personally reviewed following labs and imaging studies  CBC: Recent Labs  Lab 02/06/22 0720 02/07/22 0406 02/08/22 0315 02/09/22 0138 02/10/22 0220  WBC 12.0* 10.0 8.7 8.5 7.9  HGB 9.0* 8.3* 8.9* 9.1* 8.7*  HCT 26.2* 25.8* 28.0* 28.4* 26.9*  MCV 89.4 90.8 92.1 92.2 91.2  PLT 299 274 269 260 274    Basic Metabolic Panel: Recent Labs  Lab 02/04/22 0206 02/05/22 0353 02/06/22 0720 02/07/22 0406 02/08/22 0315 02/09/22 0138 02/10/22 0220  NA 138   < > 135 136 136 136 133*  K 3.8   < > 3.9 3.7 3.8 4.1 4.1  CL 96*   < > 97* 97* 97* 97* 98  CO2 27   < > 26 24 27 25 25   GLUCOSE 94   < > 90 94 93 96 99  BUN 22   < > 29* 37* 24* 35* 40*  CREATININE 5.49*   < > 7.13* 9.21* 6.55* 8.73* 9.50*  CALCIUM 8.4*   < > 8.8*  8.8* 8.6* 8.9 8.7*  MG 1.5*  --   --   --   --  2.0  --   PHOS 4.2  --   --   --   --  4.8* 5.2*   < > = values in this interval not displayed.    GFR: Estimated Creatinine Clearance: 7.6 mL/min (A) (by C-G formula based on SCr of 9.5 mg/dL (H)). Liver Function Tests: Recent Labs  Lab 02/09/22 0138 02/10/22 0220  ALBUMIN 1.9* 1.8*    No results for input(s): "LIPASE", "AMYLASE" in the last 168 hours. No results for input(s): "AMMONIA" in the last 168 hours. Coagulation Profile: No results for input(s): "INR", "PROTIME" in the last 168 hours.  Cardiac Enzymes: No results for input(s): "CKTOTAL", "CKMB", "CKMBINDEX", "TROPONINI" in the last 168 hours. BNP (last 3 results) No results for input(s): "PROBNP" in the last 8760 hours. HbA1C: No results for input(s): "HGBA1C" in the last 72 hours. CBG: Recent Labs  Lab 02/09/22 1150 02/09/22 1615 02/09/22 2305 02/10/22 0723 02/10/22 1134  GLUCAP 106* 113* 112* 102* 100*  Lipid Profile: No results for input(s): "CHOL", "HDL", "LDLCALC", "TRIG", "CHOLHDL", "LDLDIRECT" in the last 72 hours. Thyroid Function Tests: No results for input(s): "TSH", "T4TOTAL", "FREET4", "T3FREE", "THYROIDAB" in the last 72 hours. Anemia Panel: No results for input(s): "VITAMINB12", "FOLATE", "FERRITIN", "TIBC", "IRON", "RETICCTPCT" in the last 72 hours. Sepsis Labs: No results for input(s): "PROCALCITON", "LATICACIDVEN" in the last 168 hours.   Recent Results (from the past 240 hour(s))  Blood Culture (routine x 2)     Status: None   Collection Time: 02/02/22  5:12 PM   Specimen: BLOOD  Result Value Ref Range Status   Specimen Description BLOOD RIGHT ANTECUBITAL  Final   Special Requests   Final    BOTTLES DRAWN AEROBIC AND ANAEROBIC Blood Culture results may not be optimal due to an inadequate volume of blood received in culture bottles   Culture   Final    NO GROWTH 5 DAYS Performed at [facility], [address],  [place], [place] [address]    Report Status 02/07/2022 FINAL  Final  Blood Culture (routine x 2)     Status: None   Collection Time: 02/02/22  5:17 PM   Specimen: BLOOD RIGHT FOREARM  Result Value Ref Range Status   Specimen Description BLOOD RIGHT FOREARM  Final   Special Requests   Final    BOTTLES DRAWN AEROBIC AND ANAEROBIC Blood Culture results may not be optimal due to an inadequate volume of blood received in culture bottles   Culture   Final    NO GROWTH 5 DAYS Performed at [facility], [address], [place], [place] [address]    Report Status 02/07/2022 FINAL  Final  MRSA Next Gen by PCR, Nasal     Status: None   Collection Time: 02/02/22  9:21 PM   Specimen: Nasal Mucosa; Nasal Swab  Result Value Ref Range Status   MRSA by PCR Next Gen NOT DETECTED NOT DETECTED Final    Comment: (NOTE) The GeneXpert MRSA Assay (FDA approved for NASAL specimens only), is one component of a comprehensive MRSA colonization surveillance program. It is not intended to diagnose MRSA infection nor to guide or monitor treatment for MRSA infections. Test performance is not FDA approved in patients less than 70 years old. Performed at [facility], [address] [place], [place] [address]       Radiology Studies: No results found.  Scheduled Meds:  (feeding supplement) PROSource Plus  30 mL Oral BID BM   sodium chloride   Intravenous Once   amiodarone  200 mg Oral BID   apixaban  5 mg Oral BID   atorvastatin  40 mg Oral QHS   calcitRIOL  0.25 mcg Oral Q T,Th,Sa-HD   Chlorhexidine Gluconate Cloth  6 each Topical Q0600   darbepoetin (ARANESP) injection - DIALYSIS  150 mcg Intravenous Q Thu-HD   diltiazem  180 mg Oral Daily   docusate sodium  100 mg Oral BID   feeding supplement (NEPRO CARB STEADY)  237 mL Oral TID BM   insulin aspart  0-5 Units Subcutaneous QHS   insulin aspart  0-6 Units Subcutaneous TID WC   midodrine  10 mg Oral Q T,Th,Sa-HD   multivitamin  1 tablet Oral  QHS   sevelamer carbonate  1,600 mg Oral TID WC   Continuous Infusions:  sodium chloride Stopped (02/02/22 2322)     LOS: 8 days   Time spent: 40 minutes   Tanner Kilner Estill Cotta, MD [facility]  If 7PM-7AM, please contact night-coverage [email or web address] 02/10/2022, 12:57 PM

## 2022-02-11 ENCOUNTER — Inpatient Hospital Stay (HOSPITAL_COMMUNITY): Payer: Medicare Other

## 2022-02-11 LAB — CBC
HCT: 26.8 % — ABNORMAL LOW (ref 39.0–52.0)
Hemoglobin: 8.7 g/dL — ABNORMAL LOW (ref 13.0–17.0)
MCH: 29.8 pg (ref 26.0–34.0)
MCHC: 32.5 g/dL (ref 30.0–36.0)
MCV: 91.8 fL (ref 80.0–100.0)
Platelets: 289 10*3/uL (ref 150–400)
RBC: 2.92 MIL/uL — ABNORMAL LOW (ref 4.22–5.81)
RDW: 15.5 % (ref 11.5–15.5)
WBC: 7.6 10*3/uL (ref 4.0–10.5)
nRBC: 0 % (ref 0.0–0.2)

## 2022-02-11 LAB — RENAL FUNCTION PANEL
Albumin: 1.8 g/dL — ABNORMAL LOW (ref 3.5–5.0)
Anion gap: 14 (ref 5–15)
BUN: 51 mg/dL — ABNORMAL HIGH (ref 8–23)
CO2: 24 mmol/L (ref 22–32)
Calcium: 8.8 mg/dL — ABNORMAL LOW (ref 8.9–10.3)
Chloride: 95 mmol/L — ABNORMAL LOW (ref 98–111)
Creatinine, Ser: 11.53 mg/dL — ABNORMAL HIGH (ref 0.61–1.24)
GFR, Estimated: 4 mL/min — ABNORMAL LOW (ref >60)
Glucose, Bld: 101 mg/dL — ABNORMAL HIGH (ref 70–99)
Phosphorus: 5.9 mg/dL — ABNORMAL HIGH (ref 2.5–4.6)
Potassium: 4.2 mmol/L (ref 3.5–5.1)
Sodium: 133 mmol/L — ABNORMAL LOW (ref 135–145)

## 2022-02-11 LAB — GLUCOSE, CAPILLARY
Glucose-Capillary: 87 mg/dL (ref 70–99)
Glucose-Capillary: 109 mg/dL — ABNORMAL HIGH (ref 70–99)
Glucose-Capillary: 92 mg/dL (ref 70–99)

## 2022-02-11 LAB — MAGNESIUM: Magnesium: 2.2 mg/dL (ref 1.7–2.4)

## 2022-02-11 MED ORDER — PENTAFLUOROPROP-TETRAFLUOROETH EX AERO: 1.0000 | INHALATION_SPRAY | CUTANEOUS | Status: DC | PRN

## 2022-02-11 MED ORDER — HEPARIN SODIUM (PORCINE) 1000 UNIT/ML IJ SOLN
INTRAMUSCULAR | Status: AC
  Filled 2022-02-11: qty 4

## 2022-02-11 MED ORDER — CALCITRIOL 0.25 MCG PO CAPS
0.2500 ug | ORAL_CAPSULE | Freq: Once | ORAL | Status: AC
Start: 2022-02-11 — End: 2022-02-11

## 2022-02-11 MED ORDER — ANTICOAGULANT SODIUM CITRATE 4% (200MG/5ML) IV SOLN
5.0000 mL | Status: DC | PRN
Start: 2022-02-11 — End: 2022-02-11
  Filled 2022-02-11: qty 5

## 2022-02-11 MED ORDER — LIDOCAINE-PRILOCAINE 2.5-2.5 % EX CREA
1.0000 | TOPICAL_CREAM | CUTANEOUS | Status: DC | PRN
Start: 2022-02-11 — End: 2022-02-11

## 2022-02-11 MED ORDER — HEPARIN SODIUM (PORCINE) 1000 UNIT/ML DIALYSIS: 1000.0000 [IU] | INTRAMUSCULAR | Status: DC | PRN

## 2022-02-11 MED ORDER — LIDOCAINE HCL (PF) 1 % IJ SOLN: 5.0000 mL | INTRAMUSCULAR | Status: DC | PRN

## 2022-02-11 MED ORDER — ALTEPLASE 2 MG IJ SOLR: 2.0000 mg | Freq: Once | INTRAMUSCULAR | Status: DC | PRN

## 2022-02-11 MED ORDER — MIDODRINE HCL 5 MG PO TABS
10.0000 mg | ORAL_TABLET | Freq: Once | ORAL | Status: AC
Start: 2022-02-11 — End: 2022-02-11

## 2022-02-11 NOTE — Progress Notes (Signed)
 [facility] Progress Note   Subjective: Seen on HD. C/O of being cold. Denies CP/SOB. Short HD again tomorrow to resume T,Th, S. Patient has been on CCB (Dilt) for 3 days, tolerating well. Will continue.   Objective Vitals:   02/11/22 0742 02/11/22 0745 02/11/22 0751 02/11/22 0830  BP: 138/81  125/87 (!) 134/91  Pulse: 63  66 72  Resp: 18  19 19   Temp: 98 F (36.7 C)     TempSrc: Axillary     SpO2: 98%     Weight:  72.8 kg    Height:       Physical Exam General: Chronically ill appearing male in NAD Heart: S1,S2 RRR No M/R/G  Lungs: CTAB anteriorly. Abdomen: soft, non-distended, NABS Extremities: Trace BLE edema Dialysis Access: L AVF maturing. +T/B. RIJ TDC Drsg intact   Additional Objective Labs: Basic Metabolic Panel: Recent Labs  Lab 02/09/22 0138 02/10/22 0220 02/11/22 0246  NA 136 133* 133*  K 4.1 4.1 4.2  CL 97* 98 95*  CO2 25 25 24   GLUCOSE 96 99 101*  BUN 35* 40* 51*  CREATININE 8.73* 9.50* 11.53*  CALCIUM  8.9 8.7* 8.8*  PHOS 4.8* 5.2* 5.9*   Liver Function Tests: Recent Labs  Lab 02/09/22 0138 02/10/22 0220 02/11/22 0246  ALBUMIN 1.9* 1.8* 1.8*   No results for input(s): LIPASE, AMYLASE in the last 168 hours. CBC: Recent Labs  Lab 02/07/22 0406 02/08/22 0315 02/09/22 0138 02/10/22 0220 02/11/22 0246  WBC 10.0 8.7 8.5 7.9 7.6  HGB 8.3* 8.9* 9.1* 8.7* 8.7*  HCT 25.8* 28.0* 28.4* 26.9* 26.8*  MCV 90.8 92.1 92.2 91.2 91.8  PLT 274 269 260 274 289   Blood Culture    Component Value Date/Time   SDES BLOOD RIGHT FOREARM 02/02/2022 1717   SPECREQUEST  02/02/2022 1717    BOTTLES DRAWN AEROBIC AND ANAEROBIC Blood Culture results may not be optimal due to an inadequate volume of blood received in culture bottles   CULT  02/02/2022 1717    NO GROWTH 5 DAYS Performed at [facility], [address], [place], [place] [address]    REPTSTATUS 02/07/2022 FINAL 02/02/2022 1717    Cardiac Enzymes: No results for  input(s): CKTOTAL, CKMB, CKMBINDEX, TROPONINI in the last 168 hours. CBG: Recent Labs  Lab 02/09/22 2305 02/10/22 0723 02/10/22 1134 02/10/22 1625 02/10/22 2120  GLUCAP 112* 102* 100* 112* 118*   Iron Studies: No results for input(s): IRON, TIBC, TRANSFERRIN, FERRITIN in the last 72 hours. @lablastinr3 @ Studies/Results: No results found. Medications:  sodium chloride  Stopped (02/02/22 2322)   anticoagulant sodium citrate       (feeding supplement) PROSource Plus  30 mL Oral BID BM   sodium chloride    Intravenous Once   amiodarone   200 mg Oral BID   apixaban   5 mg Oral BID   atorvastatin   40 mg Oral QHS   calcitRIOL   0.25 mcg Oral Q T,Th,Sa-HD   calcitRIOL   0.25 mcg Oral Once   Chlorhexidine  Gluconate Cloth  6 each Topical Q0600   darbepoetin (ARANESP ) injection - DIALYSIS  150 mcg Intravenous Q Thu-HD   diclofenac  Sodium  2 g Topical QID   diltiazem   180 mg Oral Daily   docusate sodium   100 mg Oral BID   feeding supplement (NEPRO CARB STEADY)  237 mL Oral TID BM   heparin  sodium (porcine)       insulin  aspart  0-5 Units Subcutaneous QHS   insulin  aspart  0-6 Units Subcutaneous  TID WC   midodrine   10 mg Oral Q T,Th,Sa-HD   midodrine   10 mg Oral Once   multivitamin  1 tablet Oral QHS   sevelamer  carbonate  1,600 mg Oral TID WC     TTS [facility] (has not started there yet, just dc'd on 6/15 after new start)     TTS [facility] (just dc'd on 6/15 after new start)     Assessment/Plan:    #: Undifferentiated Shock - per CCM, suspected possibly d/t new BP / afib medications. All BP lowering meds were held on admission.   afebrile, blood cultures NGTD but low volume samples Completed Cefepime  for 5 doses.    #: AMS - resolved. Oriented X 3.    #: ESRD - recent start on 6/08. Has TDC, also L 1st stage BVT AVF done 6/12 by Dr Eliza. On T,Th,S Schedule. Next HD 02/12/2022  prior to DC.   #: AFib-SR on monitor. Apparently had episode of HR 150s  and HD was  terminated last week. He is on amiodarone , diltiazem  and apixaban . SR on monitor.   #: Volume - overload w/mild pedal edema and pleural effusions on CXR on admission. Unfortunately hypotension prohibiting volume removal. HD 06/22 Net UF 2.5 L . Continue lowering volume as tolerated.Started midodrine  10 mg PO TIW prior ot HD. Will repeat CXR today.    #: Anemia-HGB 8.9 02/08/22. S/P 2 units of PRBCs 02/05/2021. On weekly ESA, completed Fe load.    #: CKD BMD-Corrected Ca+ 9.9. Change binders to renvela  800 mg 2 tabs PO TID AC. SABRA PTH 447. Corrected Ca+ prohibitive of increasing VDRA.    #: Nutrition-Albumin low. On regular diet. Add protein supplementation.     Disposition: Close to discharge. Has seat at [facility]. Possibly DC tomorrow if stable.    Senta Kantor H. Niema Carrara NP-C 02/11/2022, 8:49 AM  [employer] [phone]

## 2022-02-11 NOTE — Progress Notes (Signed)
Per Alonna Buckler, NP increased UF goal to 2500.

## 2022-02-11 NOTE — Progress Notes (Signed)
Physical Therapy Treatment Patient Details Name: Tanner Rollins MRN: [id number] DOB: 12/27/1950 Today's Date: 02/11/2022   History of Present Illness 71 y/o male presenting from [facility] SNF after arriving at HD lethargic and hypotensive w/ SBP in 80s and HR 90s-130s admitted to ICU. Treated for septic shock complicated by polypharmacy and complicated anemia. At SNF s/p 6/7-6/15 hospitalization for new onset of A-fib RVR and initiation of HD. PMH: ESRD, HTN, Afib with RVR, prior respiratory failure, metabolic bone disease.    PT Comments    Patient recently returned from HD and sleeping on arrival. Patient easily aroused and stated he was too "worn out" to attempt OOB. Agreeable to bed level exercises. Patient followed all simple commands to perform AROM for primarily LEs.     Recommendations for follow up therapy are one component of a multi-disciplinary discharge planning process, led by the attending physician.  Recommendations may be updated based on patient status, additional functional criteria and insurance authorization.  Follow Up Recommendations  Skilled nursing-short term rehab (<3 hours/day)     Assistance Recommended at Discharge Frequent or constant Supervision/Assistance  Patient can return home with the following Assistance with cooking/housework;Assist for transportation;A lot of help with walking and/or transfers;A lot of help with bathing/dressing/bathroom   Equipment Recommendations  Rolling walker (2 wheels);BSC/3in1    Recommendations for Other Services       Precautions / Restrictions Precautions Precautions: Fall Restrictions Weight Bearing Restrictions: No     Mobility  Bed Mobility               General bed mobility comments: pt recently returned from HD and reported he was too tired to attempt OOB; agreed to bed level exercises    Transfers                        Ambulation/Gait                   Stairs              Wheelchair Mobility    Modified Rankin (Stroke Patients Only)       Balance                                            Cognition Arousal/Alertness: Awake/alert Behavior During Therapy: Flat affect                                 Problem Solving: Slow processing General Comments: very flat, slightly lethargic, slowed response to questions but followed all simple commands for ther-ex        Exercises General Exercises - Upper Extremity Digit Composite Flexion: AROM, Both, 10 reps General Exercises - Lower Extremity Ankle Circles/Pumps: AROM, Both, 10 reps Quad Sets: AROM, Both, 10 reps Heel Slides: AROM, Both, 10 reps Hip ABduction/ADduction: AROM, Both, 10 reps    General Comments        Pertinent Vitals/Pain Pain Assessment Pain Assessment: No/denies pain    Home Living Family/patient expects to be discharged to:: Skilled nursing facility Living Arrangements: Alone Available Help at Discharge: Family;Available PRN/intermittently Type of Home: Apartment Home Access: Elevator       Home Layout: One level Home Equipment: None      Prior Function  PT Goals (current goals can now be found in the care plan section) Acute Rehab PT Goals Patient Stated Goal: get back home and be able to handle HD by myself. Time For Goal Achievement: 02/09/22 Potential to Achieve Goals: Good Progress towards PT goals: Progressing toward goals    Frequency    Min 3X/week      PT Plan Current plan remains appropriate    Co-evaluation              AM-PAC PT "6 Clicks" Mobility   Outcome Measure  Help needed turning from your back to your side while in a flat bed without using bedrails?: A Little Help needed moving from lying on your back to sitting on the side of a flat bed without using bedrails?: A Little Help needed moving to and from a bed to a chair (including a wheelchair)?: A Lot Help needed standing up  from a chair using your arms (e.g., wheelchair or bedside chair)?: A Lot Help needed to walk in hospital room?: A Lot Help needed climbing 3-5 steps with a railing? : A Lot 6 Click Score: 14    End of Session Equipment Utilized During Treatment: Oxygen Activity Tolerance: Patient limited by fatigue;Patient limited by lethargy Patient left: with call bell/phone within reach;in bed;with bed alarm set   PT Visit Diagnosis: Unsteadiness on feet (R26.81);Muscle weakness (generalized) (M62.81)     Time: 4098-1191 PT Time Calculation (min) (ACUTE ONLY): 15 min  Charges:  $Therapeutic Exercise: 8-22 mins                      Jerolyn Center, PT Acute Rehabilitation Services  Office [phone]    Zena Amos 02/11/2022, 1:44 PM

## 2022-02-12 ENCOUNTER — Inpatient Hospital Stay (HOSPITAL_COMMUNITY): Payer: Medicare Other

## 2022-02-12 HISTORY — PX: IR THORACENTESIS ASP PLEURAL SPACE W/IMG GUIDE: IMG5380

## 2022-02-12 LAB — RENAL FUNCTION PANEL
Albumin: 1.8 g/dL — ABNORMAL LOW (ref 3.5–5.0)
Anion gap: 15 (ref 5–15)
BUN: 35 mg/dL — ABNORMAL HIGH (ref 8–23)
CO2: 27 mmol/L (ref 22–32)
Calcium: 8.8 mg/dL — ABNORMAL LOW (ref 8.9–10.3)
Chloride: 95 mmol/L — ABNORMAL LOW (ref 98–111)
Creatinine, Ser: 8.68 mg/dL — ABNORMAL HIGH (ref 0.61–1.24)
GFR, Estimated: 6 mL/min — ABNORMAL LOW (ref >60)
Glucose, Bld: 80 mg/dL (ref 70–99)
Phosphorus: 4.9 mg/dL — ABNORMAL HIGH (ref 2.5–4.6)
Potassium: 4.2 mmol/L (ref 3.5–5.1)
Sodium: 137 mmol/L (ref 135–145)

## 2022-02-12 LAB — PROTEIN, TOTAL: Total Protein: 6 g/dL — ABNORMAL LOW (ref 6.5–8.1)

## 2022-02-12 LAB — BODY FLUID CELL COUNT WITH DIFFERENTIAL
Lymphs, Fluid: 23 %
Monocyte-Macrophage-Serous Fluid: 25 % — ABNORMAL LOW (ref 50–90)
Neutrophil Count, Fluid: 52 % — ABNORMAL HIGH (ref 0–25)
Total Nucleated Cell Count, Fluid: 1495 cu mm — ABNORMAL HIGH (ref 0–1000)

## 2022-02-12 LAB — PROTEIN, PLEURAL OR PERITONEAL FLUID: Total protein, fluid: <3 g/dL

## 2022-02-12 LAB — LACTATE DEHYDROGENASE, PLEURAL OR PERITONEAL FLUID: LD, Fluid: 102 U/L — ABNORMAL HIGH (ref 3–23)

## 2022-02-12 LAB — CBC
HCT: 25.6 % — ABNORMAL LOW (ref 39.0–52.0)
Hemoglobin: 8.1 g/dL — ABNORMAL LOW (ref 13.0–17.0)
MCH: 29.3 pg (ref 26.0–34.0)
MCHC: 31.6 g/dL (ref 30.0–36.0)
MCV: 92.8 fL (ref 80.0–100.0)
Platelets: 288 10*3/uL (ref 150–400)
RBC: 2.76 MIL/uL — ABNORMAL LOW (ref 4.22–5.81)
RDW: 15.4 % (ref 11.5–15.5)
WBC: 7.4 10*3/uL (ref 4.0–10.5)
nRBC: 0 % (ref 0.0–0.2)

## 2022-02-12 LAB — LACTATE DEHYDROGENASE: LDH: 162 U/L (ref 98–192)

## 2022-02-12 LAB — GRAM STAIN

## 2022-02-12 LAB — GLUCOSE, CAPILLARY
Glucose-Capillary: 72 mg/dL (ref 70–99)
Glucose-Capillary: 79 mg/dL (ref 70–99)
Glucose-Capillary: 95 mg/dL (ref 70–99)
Glucose-Capillary: 95 mg/dL (ref 70–99)

## 2022-02-12 LAB — TROPONIN I (HIGH SENSITIVITY): Troponin I (High Sensitivity): 41 ng/L — ABNORMAL HIGH (ref <18)

## 2022-02-12 MED ORDER — LIDOCAINE HCL (PF) 1 % IJ SOLN: 5.0000 mL | INTRAMUSCULAR | Status: DC | PRN

## 2022-02-12 MED ORDER — LIDOCAINE HCL (PF) 1 % IJ SOLN
INTRAMUSCULAR | Status: DC | PRN
  Administered 2022-02-12: 5 mL

## 2022-02-12 MED ORDER — PENTAFLUOROPROP-TETRAFLUOROETH EX AERO
1.0000 | INHALATION_SPRAY | CUTANEOUS | Status: DC | PRN
Start: 2022-02-12 — End: 2022-02-12

## 2022-02-12 MED ORDER — HEPARIN SODIUM (PORCINE) 1000 UNIT/ML IJ SOLN
INTRAMUSCULAR | Status: AC
  Administered 2022-02-12: 4000 [IU] via ARTERIOVENOUS_FISTULA
  Filled 2022-02-12: qty 4

## 2022-02-12 MED ORDER — LIDOCAINE HCL 1 % IJ SOLN
INTRAMUSCULAR | Status: AC
  Filled 2022-02-12: qty 20

## 2022-02-12 NOTE — Progress Notes (Signed)
[facility] Progress Note   Subjective: Seen on HD. Short HD today to resume T,Th,S schedule. Tolerating well. HR 67. Discussed thoracentesis which is planned later today. Allowed him to ask questions. Hopefully DC home today.   Objective Vitals:   02/12/22 0730 02/12/22 0800 02/12/22 0830 02/12/22 0907  BP: 127/79 133/79 120/80 124/75  Pulse: 72 71 69 67  Resp: (!) 21 18 19  (!) 22  Temp:      TempSrc:      SpO2: 99% 94% 96% (!) 87%  Weight:      Height:       Physical Exam General: Chronically ill appearing male in NAD Heart: S1,S2 RRR No M/R/G  Lungs: CTAB anteriorly. Abdomen: soft, non-distended, NABS Extremities: Trace BLE edema Dialysis Access: L AVF maturing. +T/B. RIJ TDC Drsg intact   Additional Objective Labs: Basic Metabolic Panel: Recent Labs  Lab 02/10/22 0220 02/11/22 0246 02/12/22 0444  NA 133* 133* 137  K 4.1 4.2 4.2  CL 98 95* 95*  CO2 25 24 27   GLUCOSE 99 101* 80  BUN 40* 51* 35*  CREATININE 9.50* 11.53* 8.68*  CALCIUM 8.7* 8.8* 8.8*  PHOS 5.2* 5.9* 4.9*   Liver Function Tests: Recent Labs  Lab 02/10/22 0220 02/11/22 0246 02/12/22 0444  ALBUMIN 1.8* 1.8* 1.8*   No results for input(s): "LIPASE", "AMYLASE" in the last 168 hours. CBC: Recent Labs  Lab 02/08/22 0315 02/09/22 0138 02/10/22 0220 02/11/22 0246 02/12/22 0444  WBC 8.7 8.5 7.9 7.6 7.4  HGB 8.9* 9.1* 8.7* 8.7* 8.1*  HCT 28.0* 28.4* 26.9* 26.8* 25.6*  MCV 92.1 92.2 91.2 91.8 92.8  PLT 269 260 274 289 288   Blood Culture    Component Value Date/Time   SDES BLOOD RIGHT FOREARM 02/02/2022 1717   SPECREQUEST  02/02/2022 1717    BOTTLES DRAWN AEROBIC AND ANAEROBIC Blood Culture results may not be optimal due to an inadequate volume of blood received in culture bottles   CULT  02/02/2022 1717    NO GROWTH 5 DAYS Performed at [facility], [address], [place], [place] [address]    REPTSTATUS 02/07/2022 FINAL 02/02/2022 1717    Cardiac  Enzymes: No results for input(s): "CKTOTAL", "CKMB", "CKMBINDEX", "TROPONINI" in the last 168 hours. CBG: Recent Labs  Lab 02/10/22 1625 02/10/22 2120 02/11/22 1232 02/11/22 1700 02/11/22 2117  GLUCAP 112* 118* 87 109* 92   Iron Studies: No results for input(s): "IRON", "TIBC", "TRANSFERRIN", "FERRITIN" in the last 72 hours. @lablastinr3 @ Studies/Results: DG CHEST PORT 1 VIEW  Result Date: 02/11/2022 CLINICAL DATA:  Pleural effusion EXAM: PORTABLE CHEST 1 VIEW COMPARISON:  Previous studies including the examination of 02/02/2022 FINDINGS: Heart is enlarged in size. Tip of right IJ dialysis catheter is seen in the right atrium. Central pulmonary vessels are prominent. There is increased haziness in the left upper lung fields and right parahilar region. Small to moderate bilateral pleural effusions are seen, more so on the left side with interval increase. Evaluation of left lower lung fields for infiltrates is limited by the effusion. There is no pneumothorax. IMPRESSION: Cardiomegaly. Central pulmonary vessels are prominent suggesting CHF. Small to moderate bilateral pleural effusions, more so on the left side with interval increase. Electronically Signed   By: Ernie Avena M.D.   On: 02/11/2022 13:57   Medications:  sodium chloride Stopped (02/02/22 2322)    (feeding supplement) PROSource Plus  30 mL Oral BID BM   sodium chloride   Intravenous Once  amiodarone  200 mg Oral BID   apixaban  5 mg Oral BID   atorvastatin  40 mg Oral QHS   calcitRIOL  0.25 mcg Oral Q T,Th,Sa-HD   Chlorhexidine Gluconate Cloth  6 each Topical Q0600   darbepoetin (ARANESP) injection - DIALYSIS  150 mcg Intravenous Q Thu-HD   diclofenac Sodium  2 g Topical QID   diltiazem  180 mg Oral Daily   docusate sodium  100 mg Oral BID   feeding supplement (NEPRO CARB STEADY)  237 mL Oral TID BM   insulin aspart  0-5 Units Subcutaneous QHS   insulin aspart  0-6 Units Subcutaneous TID WC   midodrine  10 mg  Oral Q T,Th,Sa-HD   multivitamin  1 tablet Oral QHS   sevelamer carbonate  1,600 mg Oral TID WC    HD orders: [facility] T,Th,S 4 hours 180NRe 400/800 70 kg 2.0K/2.5 Ca TDC -No Heparin  Assessment/Plan:    #: Undifferentiated Shock - per CCM, suspected possibly d/t new BP / afib medications. All BP lowering meds were held on admission.   afebrile, blood cultures NGTD but low volume samples Completed Cefepime for 5 doses.    #: AMS - resolved. Oriented X 3.    #: ESRD - recent start on 6/08. Has TDC, also L 1st stage BVT AVF done 6/12 by Dr Edilia Bo. On T,Th,S Schedule. Next HD 02/12/2022  prior to DC.    #: AFib- Has been SR on monitor. Apparently had episode of HR 150s  and HD was terminated last week. He is on amiodarone, diltiazem and apixaban. SR on monitor.    #: Volume - overload w/mild pedal edema and pleural effusions on CXR on admission. Unfortunately hypotension prohibiting volume removal. Net UF .Started midodrine 10 mg PO TIW prior ot HD. Will repeat CXR today.    #: Anemia-HGB 8.1 06/272023. S/P 2 units of PRBCs 02/05/2021. On weekly ESA, completed Fe load.    #: CKD BMD-Corrected Ca+ 9.9. Change binders to renvela 800 mg 2 tabs PO TID AC. Marland Kitchen PTH 447. Corrected Ca+ prohibitive of increasing VDRA.    #: Nutrition-Albumin low. On regular diet. Add protein supplementation.      Disposition: Close to discharge. Has seat at [facility]. Possibly DC tomorrow if stable.   Tanner Rollins H. Elma Shands NP-C 02/12/2022, 9:10 AM  [employer] [phone]

## 2022-02-12 NOTE — TOC Progression Note (Signed)
Transition of Care [facility]) - Initial/Assessment Note    Patient Details  Name: Tanner Rollins MRN: [id number] Date of Birth: [birth year]  [facility]) CM/SW Contact:    Ralene Bathe, LCSWA Phone Number: 02/12/2022, 11:44 AM  Clinical Narrative:                 CSW updated the facility on the delay in d/c due to the patient agreeing to a procedure.  Patient can return to [facility]) when medically ready.    Expected Discharge Plan: Skilled Nursing Facility Barriers to Discharge: Continued Medical Work up   Patient Goals and CMS Choice Patient states their goals for this hospitalization and ongoing recovery are:: Go back to [facility].gov Compare Post Acute Care list provided to:: Patient Choice offered to / list presented to : Patient  Expected Discharge Plan and Services Expected Discharge Plan: Skilled Nursing Facility       Living arrangements for the past 2 months: Apartment, Skilled Nursing Facility                                      Prior Living Arrangements/Services Living arrangements for the past 2 months: Apartment, Skilled Nursing Facility Lives with:: Self, Facility Resident Patient language and need for interpreter reviewed:: Yes Do you feel safe going back to the place where you live?: Yes      Need for Family Participation in Patient Care: Yes (Comment) Care giver support system in place?: Yes (comment)   Criminal Activity/Legal Involvement Pertinent to Current Situation/Hospitalization: No - Comment as needed  Activities of Daily Living Home Assistive Devices/Equipment: Wheelchair, Environmental consultant (specify type) ADL Screening (condition at time of admission) Patient's cognitive ability adequate to safely complete daily activities?: No Is the patient deaf or have difficulty hearing?: No Does the patient have difficulty seeing, even when wearing glasses/contacts?: No Does the patient have difficulty concentrating, remembering,  or making decisions?: No Patient able to express need for assistance with ADLs?: Yes Does the patient have difficulty dressing or bathing?: Yes Independently performs ADLs?: No Communication: Independent Dressing (OT): Needs assistance Is this a change from baseline?: Pre-admission baseline Grooming: Needs assistance Is this a change from baseline?: Pre-admission baseline Feeding: Needs assistance Is this a change from baseline?: Pre-admission baseline Bathing: Needs assistance Is this a change from baseline?: Pre-admission baseline Toileting: Needs assistance Is this a change from baseline?: Pre-admission baseline In/Out Bed: Needs assistance Is this a change from baseline?: Pre-admission baseline Walks in Home: Needs assistance Is this a change from baseline?: Pre-admission baseline Does the patient have difficulty walking or climbing stairs?: Yes Weakness of Legs: Both Weakness of Arms/Hands: Both  Permission Sought/Granted Permission sought to share information with : Family Supports Permission granted to share information with : Yes, Verbal Permission Granted     Permission granted to share info w AGENCY: [facility]        Emotional Assessment Appearance:: Appears stated age Attitude/Demeanor/Rapport: Engaged Affect (typically observed): Appropriate, Adaptable Orientation: : Oriented to Self, Oriented to Place, Oriented to  Time, Oriented to Situation Alcohol / Substance Use: Not Applicable Psych Involvement: No (comment)  Admission diagnosis:  Somnolence [R40.0] Shock (HCC) [R57.9] Hypotension due to medication [I95.2] Patient Active Problem List   Diagnosis Date Noted   Acute respiratory failure with hypoxia (HCC) 02/08/2022   Protein-calorie malnutrition, severe 02/04/2022   Shock (HCC) 02/02/2022   ESRD (end stage  renal disease) on dialysis (HCC) 02/02/2022   A-fib (HCC) 02/02/2022   Metabolic bone disease 02/02/2022   Pleural effusion 02/02/2022    Lethargy 02/02/2022   High anion gap metabolic acidosis    Hypertensive emergency 01/23/2022   Chest pain    AKI (acute kidney injury) (HCC)    CKD (chronic kidney disease) stage 4, GFR 15-29 ml/min (HCC) 03/12/2021   Enlarged prostate without urinary obstruction 03/12/2021   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   Severe hypertension 03/11/2021   PCP:  Pcp, No Pharmacy:  No Pharmacies Listed    Social Determinants of Health (SDOH) Interventions    Readmission Risk Interventions     No data to display

## 2022-02-12 NOTE — Procedures (Signed)
PROCEDURE SUMMARY:  Successful US guided diagnostic and therapeutic left thoracentesis. Yielded 850 cc of clear, amber fluid. Pt tolerated procedure well. No immediate complications.  Specimen was sent for labs. CXR ordered.  EBL < 1 mL  Shon Hough, AGNP 02/12/2022 12:09 PM

## 2022-02-12 NOTE — Progress Notes (Addendum)
Pt off the unit to IR

## 2022-02-12 NOTE — Progress Notes (Signed)
Pt off unit in HD

## 2022-02-13 DIAGNOSIS — J9601 Acute respiratory failure with hypoxia: Secondary | ICD-10-CM

## 2022-02-13 DIAGNOSIS — D631 Anemia in chronic kidney disease: Secondary | ICD-10-CM

## 2022-02-13 DIAGNOSIS — N189 Chronic kidney disease, unspecified: Secondary | ICD-10-CM

## 2022-02-13 LAB — RENAL FUNCTION PANEL
Albumin: 1.7 g/dL — ABNORMAL LOW (ref 3.5–5.0)
Anion gap: 18 — ABNORMAL HIGH (ref 5–15)
BUN: 29 mg/dL — ABNORMAL HIGH (ref 8–23)
CO2: 26 mmol/L (ref 22–32)
Calcium: 9.1 mg/dL (ref 8.9–10.3)
Chloride: 94 mmol/L — ABNORMAL LOW (ref 98–111)
Creatinine, Ser: 7.45 mg/dL — ABNORMAL HIGH (ref 0.61–1.24)
GFR, Estimated: 7 mL/min — ABNORMAL LOW (ref >60)
Glucose, Bld: 82 mg/dL (ref 70–99)
Phosphorus: 5.1 mg/dL — ABNORMAL HIGH (ref 2.5–4.6)
Potassium: 4.5 mmol/L (ref 3.5–5.1)
Sodium: 138 mmol/L (ref 135–145)

## 2022-02-13 LAB — CBC
HCT: 26 % — ABNORMAL LOW (ref 39.0–52.0)
Hemoglobin: 8.3 g/dL — ABNORMAL LOW (ref 13.0–17.0)
MCH: 29.3 pg (ref 26.0–34.0)
MCHC: 31.9 g/dL (ref 30.0–36.0)
MCV: 91.9 fL (ref 80.0–100.0)
Platelets: 298 10*3/uL (ref 150–400)
RBC: 2.83 MIL/uL — ABNORMAL LOW (ref 4.22–5.81)
RDW: 15 % (ref 11.5–15.5)
WBC: 6.8 10*3/uL (ref 4.0–10.5)
nRBC: 0 % (ref 0.0–0.2)

## 2022-02-13 LAB — CYTOLOGY - NON PAP

## 2022-02-13 LAB — GLUCOSE, CAPILLARY
Glucose-Capillary: 78 mg/dL (ref 70–99)
Glucose-Capillary: 107 mg/dL — ABNORMAL HIGH (ref 70–99)
Glucose-Capillary: 102 mg/dL — ABNORMAL HIGH (ref 70–99)

## 2022-02-13 MED ORDER — AMIODARONE HCL 200 MG PO TABS: 200.0000 mg | ORAL_TABLET | Freq: Two times a day (BID) | ORAL | 0 refills | Status: DC

## 2022-02-13 MED ORDER — MIDODRINE HCL 10 MG PO TABS: 10.0000 mg | ORAL_TABLET | ORAL | 0 refills | Status: AC

## 2022-02-13 MED ORDER — SEVELAMER CARBONATE 800 MG PO TABS
1600.0000 mg | ORAL_TABLET | Freq: Three times a day (TID) | ORAL | 0 refills | Status: AC
Start: 2022-02-13 — End: 2022-03-15

## 2022-02-13 MED ORDER — AMIODARONE HCL 200 MG PO TABS: 200.0000 mg | ORAL_TABLET | Freq: Every day | ORAL | 0 refills | Status: DC

## 2022-02-13 MED ORDER — TRAMADOL HCL 50 MG PO TABS: 25.0000 mg | ORAL_TABLET | Freq: Two times a day (BID) | ORAL | Status: DC | PRN

## 2022-02-13 NOTE — Progress Notes (Signed)
Physical Therapy Treatment Patient Details Name: Tanner Rollins MRN: [id number] DOB: [birth year] Today's Date: 02/13/2022   History of Present Illness 71 y/o male presenting from [facility] after arriving at HD lethargic and hypotensive w/ SBP in 80s and HR 90s-130s admitted to ICU. Treated for septic shock complicated by polypharmacy and complicated anemia. At SNF s/p 6/7-6/15 hospitalization for new onset of A-fib RVR and initiation of HD. PMH: ESRD, HTN, Afib with RVR, prior respiratory failure, metabolic bone disease.    PT Comments    Pt reports that he is feeling better today and is agreeable to get up for short walk. Pt limited in safe mobility by increased fatigue, in presence of decreased strength and endurance.Pt is supervision for bed mobility and min A for transfers and ambulation with RW. D/c plan remains appropriate. PT will continue to follow acutely.     Recommendations for follow up therapy are one component of a multi-disciplinary discharge planning process, led by the attending physician.  Recommendations may be updated based on patient status, additional functional criteria and insurance authorization.  Follow Up Recommendations  Skilled nursing-short term rehab (<3 hours/day)     Assistance Recommended at Discharge Frequent or constant Supervision/Assistance  Patient can return home with the following Assistance with cooking/housework;Assist for transportation;A lot of help with walking and/or transfers;A lot of help with bathing/dressing/bathroom   Equipment Recommendations  Rolling walker (2 wheels);BSC/3in1       Precautions / Restrictions Precautions Precautions: Fall Restrictions Weight Bearing Restrictions: No     Mobility  Bed Mobility Overal bed mobility: Needs Assistance Bed Mobility: Supine to Sit, Sit to Supine     Supine to sit: Supervision, HOB elevated Sit to supine: Supervision, HOB elevated   General bed mobility comments: supervision  for safety    Transfers Overall transfer level: Needs assistance Equipment used: Rolling walker (2 wheels) Transfers: Sit to/from Stand, Bed to chair/wheelchair/BSC Sit to Stand: Min assist           General transfer comment: min A for power up and steadying, vc for hand placement for power up    Ambulation/Gait Ambulation/Gait assistance: Min assist Gait Distance (Feet): 40 Feet Assistive device: Rolling walker (2 wheels) Gait Pattern/deviations: Step-through pattern, Shuffle Gait velocity: slowed Gait velocity interpretation: <1.8 ft/sec, indicate of risk for recurrent falls   General Gait Details: light min A for steadying with slowed, slightly unsteady gait, no overt LoB       Balance Overall balance assessment: Needs assistance Sitting-balance support: No upper extremity supported, Feet supported Sitting balance-Leahy Scale: Fair     Standing balance support: No upper extremity supported, During functional activity Standing balance-Leahy Scale: Poor Standing balance comment: requires UE support                            Cognition Arousal/Alertness: Awake/alert Behavior During Therapy: Flat affect                                 Problem Solving: Slow processing General Comments: very flat, , slowed response to questions but followed all simple commands        Exercises General Exercises - Upper Extremity Elbow Extension: AROM, Both, 5 reps, Supine General Exercises - Lower Extremity Ankle Circles/Pumps: AROM, Both, 10 reps Long Arc Quad: AROM, Both, 10 reps, Seated Hip ABduction/ADduction: AROM, Both, 10 reps, Seated Hip Flexion/Marching: AROM, Both, 10 reps,  Seated    General Comments General comments (skin integrity, edema, etc.): pt on 2L O2 via [place]      Pertinent Vitals/Pain Pain Assessment Pain Assessment: Faces Faces Pain Scale: Hurts little more Pain Location: L elbow with extension Pain Descriptors / Indicators:  Grimacing, Guarding Pain Intervention(s): Limited activity within patient's tolerance, Monitored during session, Repositioned     PT Goals (current goals can now be found in the care plan section) Acute Rehab PT Goals Patient Stated Goal: get back home and be able to handle HD by myself. Time For Goal Achievement: 02/09/22 Potential to Achieve Goals: Good Progress towards PT goals: Progressing toward goals    Frequency    Min 3X/week      PT Plan Current plan remains appropriate       AM-PAC PT "6 Clicks" Mobility   Outcome Measure  Help needed turning from your back to your side while in a flat bed without using bedrails?: A Little Help needed moving from lying on your back to sitting on the side of a flat bed without using bedrails?: A Little Help needed moving to and from a bed to a chair (including a wheelchair)?: A Little Help needed standing up from a chair using your arms (e.g., wheelchair or bedside chair)?: A Little Help needed to walk in hospital room?: A Lot Help needed climbing 3-5 steps with a railing? : Total 6 Click Score: 15    End of Session Equipment Utilized During Treatment: Oxygen Activity Tolerance: Patient limited by fatigue Patient left: with call bell/phone within reach;in bed;with bed alarm set Nurse Communication: Mobility status PT Visit Diagnosis: Unsteadiness on feet (R26.81);Muscle weakness (generalized) (M62.81)     Time: 5726-2035 PT Time Calculation (min) (ACUTE ONLY): 23 min  Charges:  $Gait Training: 8-22 mins $Therapeutic Exercise: 8-22 mins                     Calab Sachse B. [name] PT, DPT Acute Rehabilitation Services Please use secure chat or  Call Office [phone]    [place] 02/13/2022, 1:54 PM

## 2022-02-13 NOTE — Progress Notes (Signed)
Occupational Therapy Treatment Patient Details Name: Tanner Rollins MRN: [id number] DOB: 1951-08-06 Today's Date: 02/13/2022   History of present illness 71 y/o male presenting from [facility] after arriving at HD lethargic and hypotensive w/ SBP in 80s and HR 90s-130s admitted to ICU. Treated for septic shock complicated by polypharmacy and complicated anemia. At SNF s/p 6/7-6/15 hospitalization for new onset of A-fib RVR and initiation of HD. PMH: ESRD, HTN, Afib with RVR, prior respiratory failure, metabolic bone disease.   OT comments  Pt making incremental progress, limited by fatigue. Overall requiring min assist for all ADL's and functional mobility. He required multiple rest breaks in a limited amount of time due to fatigue from prior PT session. OT will follow acutely.    Recommendations for follow up therapy are one component of a multi-disciplinary discharge planning process, led by the attending physician.  Recommendations may be updated based on patient status, additional functional criteria and insurance authorization.    Follow Up Recommendations  Skilled nursing-short term rehab (<3 hours/day)    Assistance Recommended at Discharge Frequent or constant Supervision/Assistance  Patient can return home with the following  A little help with bathing/dressing/bathroom;A little help with walking and/or transfers;Assistance with cooking/housework;Assist for transportation;Direct supervision/assist for medications management;Help with stairs or ramp for entrance;Direct supervision/assist for financial management   Equipment Recommendations  None recommended by OT    Recommendations for Other Services      Precautions / Restrictions Precautions Precautions: Fall Restrictions Weight Bearing Restrictions: No       Mobility Bed Mobility Overal bed mobility: Needs Assistance Bed Mobility: Supine to Sit, Sit to Supine     Supine to sit: Supervision, HOB elevated Sit to  supine: Supervision, HOB elevated   General bed mobility comments: supervision for safety    Transfers Overall transfer level: Needs assistance Equipment used: Rolling walker (2 wheels) Transfers: Sit to/from Stand, Bed to chair/wheelchair/BSC Sit to Stand: Min assist           General transfer comment: min A for power up and steadying, vc for hand placement for power up     Balance Overall balance assessment: Needs assistance Sitting-balance support: No upper extremity supported, Feet supported Sitting balance-Leahy Scale: Fair     Standing balance support: No upper extremity supported, During functional activity Standing balance-Leahy Scale: Poor Standing balance comment: requires UE support                           ADL either performed or assessed with clinical judgement   ADL Overall ADL's : Needs assistance/impaired     Grooming: Wash/dry hands;Wash/dry face;Oral care;Min guard;Standing Grooming Details (indicate cue type and reason): Required seated rest break during                               General ADL Comments: Limited due to fatigue    Extremity/Trunk Assessment              Vision       Perception     Praxis      Cognition Arousal/Alertness: Awake/alert Behavior During Therapy: Flat affect                                 Problem Solving: Slow processing General Comments: very flat, , slowed response to questions but followed all simple commands  Exercises      Shoulder Instructions       General Comments VSS on 2L - very fatigued following PT session    Pertinent Vitals/ Pain       Pain Assessment Pain Assessment: Faces Faces Pain Scale: Hurts little more Pain Location: L elbow with extension Pain Descriptors / Indicators: Grimacing, Guarding Pain Intervention(s): Limited activity within patient's tolerance, Monitored during session, Repositioned  Home Living                                           Prior Functioning/Environment              Frequency  Min 2X/week        Progress Toward Goals  OT Goals(current goals can now be found in the care plan section)  Progress towards OT goals: Progressing toward goals  Acute Rehab OT Goals Patient Stated Goal: To get strong enough to go home OT Goal Formulation: With patient Time For Goal Achievement: 02/22/22 Potential to Achieve Goals: Good ADL Goals Pt Will Perform Grooming: with supervision;standing Pt Will Perform Lower Body Bathing: with supervision;sit to/from stand Pt Will Perform Lower Body Dressing: with supervision;sit to/from stand Pt Will Transfer to Toilet: with supervision;ambulating;bedside commode Pt Will Perform Toileting - Clothing Manipulation and hygiene: with supervision;sit to/from stand Pt/caregiver will Perform Home Exercise Program: Left upper extremity;Increased ROM;With written HEP provided;Independently  Plan Discharge plan remains appropriate;Frequency remains appropriate    Co-evaluation                 AM-PAC OT "6 Clicks" Daily Activity     Outcome Measure   Help from another person eating meals?: None Help from another person taking care of personal grooming?: A Little Help from another person toileting, which includes using toliet, bedpan, or urinal?: A Little Help from another person bathing (including washing, rinsing, drying)?: A Little Help from another person to put on and taking off regular upper body clothing?: A Little Help from another person to put on and taking off regular lower body clothing?: A Lot 6 Click Score: 18    End of Session Equipment Utilized During Treatment: Gait belt;Rolling walker (2 wheels);Oxygen  OT Visit Diagnosis: Unsteadiness on feet (R26.81);Muscle weakness (generalized) (M62.81)   Activity Tolerance Patient limited by fatigue   Patient Left in bed;with call bell/phone within reach   Nurse  Communication Mobility status        Time: 3244-0102 OT Time Calculation (min): 10 min  Charges: OT General Charges $OT Visit: 1 Visit OT Treatments $Self Care/Home Management : 8-22 mins  Brayan Votaw H., OTR/L Acute Rehabilitation  Hadas Jessop Elane Yolanda Bonine 02/13/2022, 6:33 PM

## 2022-02-13 NOTE — Progress Notes (Signed)
Nursing report called to [facility] nurse Clarene Critchley

## 2022-02-13 NOTE — Progress Notes (Signed)
D/C order noted. Contacted [facility] to advise clinic that pt will d/c to snf today and will resume care tomorrow. Pt will need to arrive at clinic tomorrow at 10:00 to complete remaining consents. Info provided to CSW on Monday who provided to snf per notes. Info added to AVS as well.   Melven Sartorius Renal Navigator [phone]

## 2022-02-13 NOTE — TOC Transition Note (Addendum)
Transition of Care [facility]) - CM/SW Discharge Note   Patient Details  Name: Tanner Rollins MRN: [id number] Date of Birth: [birth year]  Transition of Care [facility]) CM/SW Contact:  Milinda Antis, [place] Phone Number: 02/13/2022, 2:29 PM   Clinical Narrative:    Patient will DC to: [facility]  Anticipated DC date: 02/13/2022 Transport by:  Corey Harold   Per MD patient ready for DC to SNF. RN to call report prior to discharge [phone] room 310A. RN, patient, patient's family, and facility notified of DC. Discharge Summary and FL2 sent to facility. DC packet on chart. Ambulance transport requested for patient.   CSW will sign off for now as social work intervention is no longer needed. Please consult [place] again if new needs arise.     Final next level of care: [place] Barriers to Discharge: Barriers Resolved   Patient Goals and CMS Choice Patient states their goals for this hospitalization and ongoing recovery are:: Go back to [facility].gov Compare Post Acute Care list provided to:: Patient Choice offered to / list presented to : Patient  Discharge Placement PASRR number recieved: 02/13/22            Patient chooses bed at: [facility]) Patient to be transferred to facility by: [place] Name of family member notified: seung, nidiffer (Sister)   [phone] Patient and family notified of of transfer: 02/13/22  Discharge Plan and Services                                     Social Determinants of Health (SDOH) Interventions     Readmission Risk Interventions     No data to display

## 2022-02-13 NOTE — Progress Notes (Signed)
DISCHARGE NOTE [facility] to be discharged [name] per MD order. Patient verbalized understanding.  Skin clean, dry and intact without evidence of skin break down, no evidence of skin tears noted. IV catheter discontinued intact. Site without signs and symptoms of complications. Dressing and pressure applied. Pt denies pain at the site currently. No complaints noted.  Patient free of lines, drains, and wounds.   Discharge packet assembled. An After Visit Summary (AVS) was printed and given to the EMS personnel. Patient escorted via stretcher and discharged to [name] via ambulance. Report called to accepting facility; all questions and concerns addressed.   Fatimah Sundquist S Keleigh Kazee, RN _______________________________________________________________________

## 2022-02-13 NOTE — Discharge Summary (Addendum)
Physician Discharge Summary   Patient: Tanner Rollins MRN: [id number] DOB: September 16, 1950  Admit date:     02/02/2022  Discharge date: 02/13/22  Discharge Physician: Marylu Lund   PCP: Pcp, No   Recommendations at discharge:    Follow up with PCP in 1-2 weeks Follow up at afib clinic as scheduled  Discharge Diagnoses: Principal Problem:   Shock ([place]) Active Problems:   Lethargy   Anemia of renal disease   ESRD (end stage renal disease) on dialysis ([place])   A-fib ([facility])   Metabolic bone disease   Pleural effusion   Protein-calorie malnutrition, severe   Acute respiratory failure with hypoxia ([place])  Resolved Problems:   * No resolved hospital problems. *  Hospital Course: Tanner Rollins is a 71 y.o. male [employer] veteran with past medical history significant for HTN, CKD stage IV, paroxysmal atrial fibrillation, anemia of chronic renal disease who presented to [facility] ED from his outpatient dialysis unit on 6/17 with hypotension, lethargy and confusion.  Patient was noted to have SBP's in the 80s with HR ranging 90s-130s.  Recently discharged to [facility] on 6/15 with new start of hemodialysis.  Patient was initially admitted to the critical care service given need for vasopressors as patient's ESRD with HD would not be able to tolerate massive volume resuscitation.  Patient was titrated off of vasopressors and transferred to the hospitalist service on 6/19.   Timeline:  6/19: blood pressure drastically improving without needing pressors, transitioned to hospitalist service 6/20: moderate anemia, 6.4 requiring transfusion with dialysis, hypotension resolving 6/21: mental status hypotension and anemia resolving, not yet back to baseline but improving with antibiotics, supportive care and discontinuation of heart rate medications as above 6/22: Remains somewhat somnolent after dialysis, weak, unable to participate with PT/interview very well. 6/23: working with PT did not tolerate well  became hypotensive and hypoxic in 87- back on [place]- able to use bedside chair 6/24: He went into A-fib with RVR during dialysis.  Hemodialysis discontinued.  Cardiology consulted 6/25: Continues to have pleuritic chest pain 6/26: Tolerating HD, cardiology now signed off 6/27: s/p left thoracentesis 6/28: plan to return to SNF  Assessment and Plan: Acute metabolic encephalopathy TSH within normal limits.  Improved after treatment with IV antibiotics and normalization of blood pressure.   Septic shock Community-acquired pneumonia Patient presenting with hypotension, bradycardia with shortness of breath and confusion.  Imaging concerning for pneumonia with elevated procalcitonin and lactic acid.  Initially required vasopressors in the intensive care unit, which were slowly weaned off.  Blood culture showed no growth x5 days.  Patient completed 5-day course of antibiotics.   Atrial fibrillation with RVR While on dialysis on 6/24, patient developed A-fib with RVR with heart rates in the 140s.  His hemodialysis was discontinued.  Patient was given Lopressor 5 mg IV x1.  Cardiology was consulted and patient was started on amiodarone. --Amiodarone 200 mg p.o. BID x 2 weeks, followed by 200mg  PO daily --Diltiazem 180 mg p.o. daily --Eliquis 5 mg p.o. twice daily   Chest pain, likely musculoskeletal etiology Troponin flat, EKG with no concerning dynamic changes, not consistent with acute coronary syndrome.  TTE with normal LVEF with grade 1 diastolic dysfunction.  Chest pain reproducible with palpation. Evaluated by cardiology, no further work-up needed.   Pleural effusion Acute respiratory failure with hypoxia Chest x-ray with continued moderate left pleural effusion.  Initially declined thoracentesis but agreeable today.  Underwent IR ultrasound-guided thoracentesis with 850 mL of clear amber fluid removed. --Pleural  fluid culture neg --Continue supplemental oxygen, maintain SPO2 greater than 92%,  cont to wean O2 as tolerated - appears mostly transudative per Light's criteria   Acute on chronic anemia of renal disease Received 2 units PRBCs and IV iron during hospitalization.  Continue ESA.  Hemoglobin 8.7, stable.   ESRD on HD TTS --Nephrology following, appreciate assistance --Continue midodrine 10 mg p.o. on Tuesday/Thursday/Saturday with HD --Cont outpatient follow-up [place] [facility] clinic, next session 6/29   HLD: Atorvastatin 40 mg p.o. daily        Consultants: PCCM, Vascular surgery, Cardiology, nephrology Procedures performed: Thoracentesis 6/27  Disposition: Skilled nursing facility Diet recommendation:  Renal diet DISCHARGE MEDICATION: Allergies as of 02/13/2022   No Known Allergies      Medication List     STOP taking these medications    calcitRIOL 0.25 MCG capsule Commonly known as: ROCALTROL   carvedilol 25 MG tablet Commonly known as: COREG       TAKE these medications    amiodarone 200 MG tablet Commonly known as: PACERONE Take 1 tablet (200 mg total) by mouth 2 (two) times daily for 9 days.   amiodarone 200 MG tablet Commonly known as: Pacerone Take 1 tablet (200 mg total) by mouth daily. Start taking on: February 23, 2022   apixaban 5 MG Tabs tablet Commonly known as: ELIQUIS Take 1 tablet (5 mg total) by mouth 2 (two) times daily.   atorvastatin 40 MG tablet Commonly known as: LIPITOR Take 40 mg by mouth at bedtime.   calcium acetate 667 MG capsule Commonly known as: PHOSLO Take 2 capsules (1,334 mg total) by mouth 3 (three) times daily with meals.   diltiazem 180 MG 24 hr capsule Commonly known as: CARDIZEM CD Take 1 capsule (180 mg total) by mouth daily. What changed: when to take this   docusate sodium 100 MG capsule Commonly known as: COLACE Take 1 capsule (100 mg total) by mouth 2 (two) times daily.   feeding supplement (NEPRO CARB STEADY) Liqd Take 237 mLs by mouth every evening.   midodrine 10 MG tablet Commonly known  as: PROAMATINE Take 1 tablet (10 mg total) by mouth Every Tuesday,Thursday,and Saturday with dialysis. Start taking on: February 14, 2022   polyethylene glycol 17 g packet Commonly known as: MIRALAX / GLYCOLAX Take 17 g by mouth daily as needed for moderate constipation.   sevelamer carbonate 800 MG tablet Commonly known as: RENVELA Take 2 tablets (1,600 mg total) by mouth 3 (three) times daily with meals.        Contact information for follow-up providers     Follow up with PCP in 1-2 weeks Follow up.   Why: Hospital follow up        Follow up with Afib clinic as scheduled Follow up.   Why: Hospital follow up        Center, [facility]. Go on 02/14/2022.   Why: Please arrive at 10:00 so pt can complete consents prior to treatment. Contact information: [place] [address] [phone]              Contact information for after-discharge care     Destination     [facility] .   Service: Skilled Nursing Contact information: [address] [place] [phone]                    Discharge Exam: Filed Weights   02/12/22 0715 02/12/22 1018  02/13/22 0500  Weight: 70 kg 68.4 kg 69 kg   General exam: Awake, laying in bed, in nad Respiratory system: Normal respiratory effort, no wheezing Cardiovascular system: regular rate, s1, s2 Gastrointestinal system: Soft, nondistended, positive BS Central nervous system: CN2-12 grossly intact, strength intact Extremities: Perfused, no clubbing Skin: Normal skin turgor, no notable skin lesions seen Psychiatry: Mood normal // no visual hallucinations   Condition at discharge: fair  The results of significant diagnostics from this hospitalization (including imaging, microbiology, ancillary and laboratory) are listed below for reference.   Imaging Studies: IR THORACENTESIS ASP PLEURAL SPACE W/IMG GUIDE  Result Date:  02/12/2022 INDICATION: Patient with history of ESRD on HD admitted for septic shock and CAP. Patient found to have moderate-size pleural effusion on chest x-ray. Request for diagnostic and therapeutic left thoracentesis. EXAM: ULTRASOUND GUIDED DIAGNOSTIC AND THERAPEUTIC LEFT THORACENTESIS MEDICATIONS: 10 mL 1 % lidocaine COMPLICATIONS: None immediate. PROCEDURE: An ultrasound guided thoracentesis was thoroughly discussed with the patient and questions answered. The benefits, risks, alternatives and complications were also discussed. The patient understands and wishes to proceed with the procedure. Written consent was obtained. Ultrasound was performed to localize and mark an adequate pocket of fluid in the left chest. The area was then prepped and draped in the normal sterile fashion. 1% Lidocaine was used for local anesthesia. Under ultrasound guidance a 6 Fr Safe-T-Centesis catheter was introduced. Thoracentesis was performed. The catheter was removed and a dressing applied. FINDINGS: A total of approximately 850 cc of clear, amber fluid was removed. Samples were sent to the laboratory as requested by the clinical team. IMPRESSION: Successful ultrasound guided left thoracentesis yielding 850 cc of pleural fluid. Read by: Narda Rutherford, AGNP-BC Electronically Signed   By: Markus Daft M.D.   On: 02/12/2022 17:25   DG Chest 1 View  Result Date: 02/12/2022 CLINICAL DATA:  Status post left thoracentesis for pleural effusion. EXAM: CHEST  1 VIEW COMPARISON:  02/11/2022 FINDINGS: Reduced size of the left pleural effusion, with at least a moderate pleural effusion remaining. Hazy density along the right lung base could reflect layering effusion on the right. Right Port-A-Cath tip: Right atrium. No pneumothorax is observed. Atherosclerotic calcification of the aortic arch. Moderate enlargement of the cardiopericardial silhouette, without edema. IMPRESSION: 1. Reduced size of the left pleural effusion, no pneumothorax. 2.  Possible layering right pleural effusion. 3. Moderate enlargement of the cardiopericardial silhouette, without edema. 4.  Aortic Atherosclerosis (ICD10-I70.0). Electronically Signed   By: Van Clines M.D.   On: 02/12/2022 12:20   DG CHEST PORT 1 VIEW  Result Date: 02/11/2022 CLINICAL DATA:  Pleural effusion EXAM: PORTABLE CHEST 1 VIEW COMPARISON:  Previous studies including the examination of 02/02/2022 FINDINGS: Heart is enlarged in size. Tip of right IJ dialysis catheter is seen in the right atrium. Central pulmonary vessels are prominent. There is increased haziness in the left upper lung fields and right parahilar region. Small to moderate bilateral pleural effusions are seen, more so on the left side with interval increase. Evaluation of left lower lung fields for infiltrates is limited by the effusion. There is no pneumothorax. IMPRESSION: Cardiomegaly. Central pulmonary vessels are prominent suggesting CHF. Small to moderate bilateral pleural effusions, more so on the left side with interval increase. Electronically Signed   By: Elmer Picker M.D.   On: 02/11/2022 13:57   DG Chest Port 1 View  Result Date: 02/02/2022 CLINICAL DATA:  Sepsis. EXAM: PORTABLE CHEST 1 VIEW COMPARISON:  01/30/2022 and prior studies FINDINGS: Enlargement  of the cardiopericardial silhouette and RIGHT IJ central venous catheter with tip overlying the SUPERIOR cavoatrial junction again noted. Pulmonary vascular congestion again noted. Bilateral LOWER lung opacities again noted, LEFT-greater-than-RIGHT. Bilateral pleural effusions are again identified. There is no evidence of pneumothorax. IMPRESSION: Unchanged chest radiograph with cardiomegaly, pulmonary vascular congestion, and bilateral LOWER lung opacities/pleural effusions. Electronically Signed   By: Margarette Canada M.D.   On: 02/02/2022 17:32   DG CHEST PORT 1 VIEW  Result Date: 01/30/2022 CLINICAL DATA:  Pleural effusion EXAM: PORTABLE CHEST 1 VIEW  COMPARISON:  01/28/2022 FINDINGS: Gross cardiomegaly with right chest large bore multi lumen vascular catheter. Small to moderate bilateral pleural effusions, slightly increased compared to prior examination. Diffuse bilateral interstitial opacity. IMPRESSION: 1. Small to moderate bilateral pleural effusions, slightly increased compared to prior examination. 2. Diffuse bilateral interstitial opacity, likely edema. 3. Cardiomegaly. Electronically Signed   By: Delanna Ahmadi M.D.   On: 01/30/2022 14:19   DG CHEST PORT 1 VIEW  Result Date: 01/28/2022 CLINICAL DATA:  Shortness of breath. EXAM: PORTABLE CHEST 1 VIEW COMPARISON:  01/25/2022. FINDINGS: 4:39 a.m., 01/29/2019. Right IJ dialysis catheter tip remains in the upper right atrium. The heart is enlarged. Mild central vascular prominence with slight basilar interstitial edema continue to be noted with moderate pleural effusions. There are opacities in the lung bases which again could represent atelectasis, or patchy consolidation such as due to pneumonia, aspiration or combination. The mid and upper lung fields are clear with COPD change. There is aortic atherosclerosis and tortuosity. Either the pleural effusions are mildly improved or redistributed. In all other respects there are no further changes. IMPRESSION: There is mild improvement in aeration of the bases but there are still patchy consolidative or atelectatic opacities overlying moderate pleural effusions. The pleural effusions may have mildly improved versus redistributed. Perihilar vascular congestion and slight basal interstitial edema appear similar. Electronically Signed   By: Telford Nab M.D.   On: 01/28/2022 06:36   VAS [place] UPPER EXT VEIN MAPPING (PRE-OP AVF)  Result Date: 01/26/2022 UPPER EXTREMITY VEIN MAPPING Patient Name:  Tanner Rollins  Date of Exam:   01/25/2022 Medical Rec #: [id number]        Accession #:    [id number] Date of Birth: 10/25/1950        Patient Gender: M Patient Age:    89 years Exam Location:  [facility] Procedure:      VAS [place] UPPER EXT VEIN MAPPING (PRE-OP AVF) Referring Phys: EMMA COLLINS --------------------------------------------------------------------------------  Indications: Pre-access. History: HTN, SMK, CKD4 (now in need of HD).  Limitations: Multiple IVs/tape Comparison Study: No previous exams Performing Technologist: Jody Hill RVT, RDMS  Examination Guidelines: A complete evaluation includes B-mode imaging, spectral Doppler, color Doppler, and power Doppler as needed of all accessible portions of each vessel. Bilateral testing is considered an integral part of a complete examination. Limited examinations for reoccurring indications may be performed as noted. +-----------------+-------------+----------+--------------+ Right Cephalic   Diameter (cm)Depth (cm)   Findings    +-----------------+-------------+----------+--------------+ Shoulder             0.28        0.42                  +-----------------+-------------+----------+--------------+ Prox upper arm       0.31        0.44                  +-----------------+-------------+----------+--------------+ Mid upper arm  0.28        0.23                  +-----------------+-------------+----------+--------------+ Dist upper arm       0.27        0.35                  +-----------------+-------------+----------+--------------+ Antecubital fossa    0.40        0.15      thrombus    +-----------------+-------------+----------+--------------+ Prox forearm         0.35        0.26                  +-----------------+-------------+----------+--------------+ Mid forearm          0.31        0.32                  +-----------------+-------------+----------+--------------+ Dist forearm                            not visualized +-----------------+-------------+----------+--------------+ Wrist                0.18        0.22                   +-----------------+-------------+----------+--------------+ +-----------------+-------------+----------+---------+ Right Basilic    Diameter (cm)Depth (cm)Findings  +-----------------+-------------+----------+---------+ Prox upper arm       0.38                         +-----------------+-------------+----------+---------+ Mid upper arm        0.26                         +-----------------+-------------+----------+---------+ Dist upper arm       0.24                         +-----------------+-------------+----------+---------+ Antecubital fossa    0.27               branching +-----------------+-------------+----------+---------+ Prox forearm         0.16                         +-----------------+-------------+----------+---------+ Mid forearm          0.17                         +-----------------+-------------+----------+---------+ Distal forearm       0.15               branching +-----------------+-------------+----------+---------+ Wrist                0.10                         +-----------------+-------------+----------+---------+ Thrumbus seen in cephalic/AC - site of lab draw +-----------------+-------------+----------+--------------+ Left Cephalic    Diameter (cm)Depth (cm)   Findings    +-----------------+-------------+----------+--------------+ Shoulder             0.21        0.50                  +-----------------+-------------+----------+--------------+ Prox upper arm       0.17        0.32                  +-----------------+-------------+----------+--------------+  Mid upper arm        0.15        0.32                  +-----------------+-------------+----------+--------------+ Dist upper arm       0.22        0.42                  +-----------------+-------------+----------+--------------+ Antecubital fossa    0.27        0.22   IV / thrombus  +-----------------+-------------+----------+--------------+ Prox forearm                             not visualized +-----------------+-------------+----------+--------------+ Mid forearm          0.21        0.43     branching    +-----------------+-------------+----------+--------------+ Dist forearm         0.16        0.31                  +-----------------+-------------+----------+--------------+ Wrist                0.10        0.33                  +-----------------+-------------+----------+--------------+ +-----------------+-------------+----------+---------+ Left Basilic     Diameter (cm)Depth (cm)Findings  +-----------------+-------------+----------+---------+ Prox upper arm       0.39                         +-----------------+-------------+----------+---------+ Mid upper arm        0.34                         +-----------------+-------------+----------+---------+ Dist upper arm       0.29                         +-----------------+-------------+----------+---------+ Antecubital fossa    0.19               branching +-----------------+-------------+----------+---------+ Prox forearm         0.16                         +-----------------+-------------+----------+---------+ Mid forearm          0.18                         +-----------------+-------------+----------+---------+ Distal forearm       0.15                         +-----------------+-------------+----------+---------+ Wrist                0.10                         +-----------------+-------------+----------+---------+ *See table(s) above for measurements and observations.  Diagnosing physician: Servando Snare MD Electronically signed by Servando Snare MD on 01/26/2022 at 9:56:07 AM.    Final    DG CHEST PORT 1 VIEW  Result Date: 01/25/2022 CLINICAL DATA:  Hypoxemia. EXAM: PORTABLE CHEST 1 VIEW COMPARISON:  Radiograph January 23, 2022 FINDINGS: Interval placement of a dual-lumen right approach central venous catheter with tip overlying the right atrium. No  visible  pneumothorax. The heart size and mediastinal contours are partially obscured but appear unchanged. Increase in now moderate right pleural effusion and stable small left pleural effusion. Opacities in the bilateral lung bases along the pleural effusions are similar prior may reflect atelectasis or consolidation. No acute osseous abnormality. IMPRESSION: Interval placement of a dual lumen right-sided central venous catheter without visible pneumothorax. Increase in a now moderate right pleural effusion with a stable small left pleural effusion and unchanged adjacent bilateral airspace opacities reflecting infiltrate or atelectasis. Electronically Signed   By: Dahlia Bailiff M.D.   On: 01/25/2022 08:20   IR Fluoro Guide CV Line Right  Result Date: 01/24/2022 CLINICAL DATA:  Renal failure, needs durable venous access for planned hemodialysis EXAM: TUNNELED HEMODIALYSIS CATHETER PLACEMENT WITH ULTRASOUND AND FLUOROSCOPIC GUIDANCE TECHNIQUE: The procedure, risks, benefits, and alternatives were explained to the patient. Questions regarding the procedure were encouraged and answered. The patient understands and consents to the procedure. As antibiotic prophylaxis, cefazolin 2 g was ordered pre-procedure and administered intravenously within one hour of incision. Patency of the right IJ vein was confirmed with ultrasound with image documentation. An appropriate skin site was determined. Region was prepped using maximum barrier technique including cap and mask, sterile gown, sterile gloves, large sterile sheet, and Chlorhexidine as cutaneous antisepsis. The region was infiltrated locally with 1% lidocaine. Under real-time ultrasound guidance, the right IJ vein was accessed with a 21 gauge micropuncture needle; the needle tip within the vein was confirmed with ultrasound image documentation. Needle exchanged over the 018 guidewire for transitional dilator, which allowed advancement of a Benson wire into the IVC.  Over this, an MPA catheter was advanced. A Palindrome 23 hemodialysis catheter was tunneled from the right anterior chest wall approach to the right IJ dermatotomy site. The MPA catheter was exchanged over an Amplatz wire for serial vascular dilators which allow placement of a peel-away sheath, through which the catheter was advanced under intermittent fluoroscopy, positioned with its tips in the proximal and midright atrium. Spot chest radiograph confirms good catheter position. No pneumothorax. Catheter was flushed and primed per protocol. Catheter secured externally with O Prolene sutures. The right IJ dermatotomy site was closed with Dermabond. COMPLICATIONS: COMPLICATIONS None immediate COMPARISON:  None Available. IMPRESSION: 1. Technically successful placement of tunneled right IJ hemodialysis catheter with ultrasound and fluoroscopic guidance. Ready for routine use. ACCESS: Remains approachable for percutaneous intervention as needed. Electronically Signed   By: Lucrezia Europe M.D.   On: 01/24/2022 15:55   IR US Guide Vasc Access Right  Result Date: 01/24/2022 CLINICAL DATA:  Renal failure, needs durable venous access for planned hemodialysis EXAM: TUNNELED HEMODIALYSIS CATHETER PLACEMENT WITH ULTRASOUND AND FLUOROSCOPIC GUIDANCE TECHNIQUE: The procedure, risks, benefits, and alternatives were explained to the patient. Questions regarding the procedure were encouraged and answered. The patient understands and consents to the procedure. As antibiotic prophylaxis, cefazolin 2 g was ordered pre-procedure and administered intravenously within one hour of incision. Patency of the right IJ vein was confirmed with ultrasound with image documentation. An appropriate skin site was determined. Region was prepped using maximum barrier technique including cap and mask, sterile gown, sterile gloves, large sterile sheet, and Chlorhexidine as cutaneous antisepsis. The region was infiltrated locally with 1% lidocaine. Under  real-time ultrasound guidance, the right IJ vein was accessed with a 21 gauge micropuncture needle; the needle tip within the vein was confirmed with ultrasound image documentation. Needle exchanged over the 018 guidewire for transitional dilator, which allowed advancement of a Benson wire  into the IVC. Over this, an MPA catheter was advanced. A Palindrome 23 hemodialysis catheter was tunneled from the right anterior chest wall approach to the right IJ dermatotomy site. The MPA catheter was exchanged over an Amplatz wire for serial vascular dilators which allow placement of a peel-away sheath, through which the catheter was advanced under intermittent fluoroscopy, positioned with its tips in the proximal and midright atrium. Spot chest radiograph confirms good catheter position. No pneumothorax. Catheter was flushed and primed per protocol. Catheter secured externally with O Prolene sutures. The right IJ dermatotomy site was closed with Dermabond. COMPLICATIONS: COMPLICATIONS None immediate COMPARISON:  None Available. IMPRESSION: 1. Technically successful placement of tunneled right IJ hemodialysis catheter with ultrasound and fluoroscopic guidance. Ready for routine use. ACCESS: Remains approachable for percutaneous intervention as needed. Electronically Signed   By: Lucrezia Europe M.D.   On: 01/24/2022 15:55   ECHOCARDIOGRAM COMPLETE  Result Date: 01/24/2022    ECHOCARDIOGRAM REPORT   Patient Name:   Tanner Rollins Date of Exam: 01/24/2022 Medical Rec #:  [id number]       Height:       71.5 in Accession #:    [id number]      Weight:       159.6 lb Date of Birth:  1951/03/31       BSA:          1.926 m Patient Age:    39 years        BP:           128/85 mmHg Patient Gender: M               HR:           68 bpm. Exam Location:  Inpatient Procedure: 2D Echo, Cardiac Doppler, Color Doppler, Strain Analysis and 3D Echo                     STAT ECHO Reported to: Dr. Oval Linsey on 01/24/2022 8:35:00 AM. Indications:    Chest  pain  History:        Patient has no prior history of Echocardiogram examinations.                 Risk Factors:Hypertension.  Sonographer:    Luisa Hart RDCS Referring Phys: [id number] [place]  1. Left ventricular ejection fraction, by estimation, is 55 to 60%. The left ventricle has normal function. The left ventricle has no regional wall motion abnormalities. There is moderate concentric left ventricular hypertrophy. Left ventricular diastolic parameters are consistent with Grade I diastolic dysfunction (impaired relaxation). Elevated left ventricular end-diastolic pressure.  2. Right ventricular systolic function is normal. The right ventricular size is normal. There is moderately elevated pulmonary artery systolic pressure.  3. Left atrial size was severely dilated.  4. Right atrial size was mildly dilated.  5. The mitral valve is normal in structure. Trivial mitral valve regurgitation. No evidence of mitral stenosis.  6. The aortic valve is tricuspid. Aortic valve regurgitation is not visualized. No aortic stenosis is present.  7. The inferior vena cava is dilated in size with <50% respiratory variability, suggesting right atrial pressure of 15 mmHg. FINDINGS  Left Ventricle: Left ventricular ejection fraction, by estimation, is 55 to 60%. The left ventricle has normal function. The left ventricle has no regional wall motion abnormalities. Global longitudinal strain performed but not reported based on interpreter judgement due to suboptimal tracking. The left ventricular internal cavity size was normal in size.  There is moderate concentric left ventricular hypertrophy. Left ventricular diastolic parameters are consistent with Grade I diastolic dysfunction (impaired relaxation). Elevated left ventricular end-diastolic pressure. Right Ventricle: The right ventricular size is normal. No increase in right ventricular wall thickness. Right ventricular systolic function is normal. There is  moderately elevated pulmonary artery systolic pressure. The tricuspid regurgitant velocity is 2.90 m/s, and with an assumed right atrial pressure of 15 mmHg, the estimated right ventricular systolic pressure is 31.4 mmHg. Left Atrium: Left atrial size was severely dilated. Right Atrium: Right atrial size was mildly dilated. Pericardium: There is no evidence of pericardial effusion. Mitral Valve: The mitral valve is normal in structure. Trivial mitral valve regurgitation. No evidence of mitral valve stenosis. MV peak gradient, 4.0 mmHg. The mean mitral valve gradient is 1.0 mmHg. Tricuspid Valve: The tricuspid valve is normal in structure. Tricuspid valve regurgitation is trivial. No evidence of tricuspid stenosis. Aortic Valve: The aortic valve is tricuspid. Aortic valve regurgitation is not visualized. No aortic stenosis is present. Aortic valve mean gradient measures 6.0 mmHg. Aortic valve peak gradient measures 11.8 mmHg. Aortic valve area, by VTI measures 3.65  cm. Pulmonic Valve: The pulmonic valve was normal in structure. Pulmonic valve regurgitation is not visualized. No evidence of pulmonic stenosis. Aorta: The aortic root is normal in size and structure. Venous: The inferior vena cava is dilated in size with less than 50% respiratory variability, suggesting right atrial pressure of 15 mmHg. IAS/Shunts: No atrial level shunt detected by color flow Doppler.  LEFT VENTRICLE PLAX 2D LVIDd:         4.10 cm     Diastology LVIDs:         3.10 cm     LV e' medial:    4.79 cm/s LV PW:         1.50 cm     LV E/e' medial:  14.8 LV IVS:        1.50 cm     LV e' lateral:   7.04 cm/s LVOT diam:     2.20 cm     LV E/e' lateral: 10.1 LV SV:         120 LV SV Index:   62          2D Longitudinal Strain LVOT Area:     3.80 cm    2D Strain GLS (A2C):   -10.4 %                            2D Strain GLS (A3C):   -17.3 %                            2D Strain GLS (A4C):   -14.4 % LV Volumes (MOD)           2D Strain GLS Avg:      -14.0 % LV vol d, MOD A2C: 92.9 ml LV vol d, MOD A4C: 88.4 ml LV vol s, MOD A2C: 25.7 ml LV vol s, MOD A4C: 46.7 ml LV SV MOD A2C:     67.2 ml LV SV MOD A4C:     88.4 ml LV SV MOD BP:      54.4 ml RIGHT VENTRICLE RV Basal diam:  4.10 cm RV Mid diam:    1.70 cm RV S prime:     20.60 cm/s TAPSE (M-mode): 2.7 cm LEFT ATRIUM  Index        RIGHT ATRIUM           Index LA Vol (A2C):   104.0 ml 54.00 ml/m  RA Area:     19.30 cm LA Vol (A4C):   118.0 ml 61.27 ml/m  RA Volume:   56.90 ml  29.55 ml/m LA Biplane Vol: 113.0 ml 58.68 ml/m  AORTIC VALVE                     PULMONIC VALVE AV Area (Vmax):    3.67 cm      PV Vmax:       1.11 m/s AV Area (Vmean):   3.61 cm      PV Vmean:      80.100 cm/s AV Area (VTI):     3.65 cm      PV VTI:        0.235 m AV Vmax:           172.00 cm/s   PV Peak grad:  4.9 mmHg AV Vmean:          113.675 cm/s  PV Mean grad:  3.0 mmHg AV VTI:            0.328 m AV Peak Grad:      11.8 mmHg AV Mean Grad:      6.0 mmHg LVOT Vmax:         166.00 cm/s LVOT Vmean:        108.000 cm/s LVOT VTI:          0.314 m LVOT/AV VTI ratio: 0.96  AORTA Ao Root diam: 3.40 cm Ao Asc diam:  3.50 cm MITRAL VALVE               TRICUSPID VALVE MV Area (PHT): 2.56 cm    TR Peak grad:   33.6 mmHg MV Area VTI:   3.90 cm    TR Vmax:        290.00 cm/s MV Peak grad:  4.0 mmHg MV Mean grad:  1.0 mmHg    SHUNTS MV Vmax:       1.00 m/s    Systemic VTI:  0.31 m MV Vmean:      54.0 cm/s   Systemic Diam: 2.20 cm MV Decel Time: 296 msec MV E velocity: 71.00 cm/s MV A velocity: 97.30 cm/s MV E/A ratio:  0.73 Skeet Latch MD Electronically signed by Skeet Latch MD Signature Date/Time: 01/24/2022/11:14:58 AM    Final    US RENAL  Result Date: 01/23/2022 CLINICAL DATA:  Acute on chronic renal failure. EXAM: RENAL / URINARY TRACT ULTRASOUND COMPLETE COMPARISON:  CT abdomen and pelvis 02/09/2021. Renal ultrasound 03/12/2021. FINDINGS: Right Kidney: Renal measurements: 9.1 x 4.1 x 3.8 cm = volume: 73 mL.  Echogenicity is increased. No hydronephrosis. There is a 3.7 x 2.8 x 3.8 cm cyst in the superior pole the right kidney which has mildly increased in size. Left Kidney: Renal measurements: 7.8 x 5.6 x 4.0 cm = volume: 92 mL. Echogenicity is increased. No mass or hydronephrosis visualized. Bladder: Appears normal for degree of bladder distention. Other: Right pleural effusion present. IMPRESSION: 1. Echogenic kidneys likely related to medical renal disease, similar to prior study. 2. No hydronephrosis. 3. Right renal cyst has mildly increased in size. 4. Right pleural effusion. Electronically Signed   By: Ronney Asters M.D.   On: 01/23/2022 23:37   DG Chest Portable 1 View  Result Date: 01/23/2022 CLINICAL DATA:  Chest pain. EXAM: PORTABLE CHEST 1 VIEW COMPARISON:  Chest x-ray 11/17/2011. CT abdomen and pelvis 03/11/2021. FINDINGS: There is patchy airspace disease in the bilateral lower lobes. There is likely a small left pleural effusion. Cardiac silhouette is enlarged, a new finding. No evidence for pneumothorax. No acute fractures. IMPRESSION: 1. New enlarged cardiac silhouette may represent cardiomegaly and or pericardial effusion. 2. Bilateral lower lobe airspace disease worrisome for infection. 3. Small left pleural effusion. Electronically Signed   By: Ronney Asters M.D.   On: 01/23/2022 17:11    Microbiology: Results for orders placed or performed during the hospital encounter of 02/02/22  Blood Culture (routine x 2)     Status: None   Collection Time: 02/02/22  5:12 PM   Specimen: BLOOD  Result Value Ref Range Status   Specimen Description BLOOD RIGHT ANTECUBITAL  Final   Special Requests   Final    BOTTLES DRAWN AEROBIC AND ANAEROBIC Blood Culture results may not be optimal due to an inadequate volume of blood received in culture bottles   Culture   Final    NO GROWTH 5 DAYS Performed at [facility], [place] [address], [place], [place] [address]    Report Status 02/07/2022 FINAL  Final   Blood Culture (routine x 2)     Status: None   Collection Time: 02/02/22  5:17 PM   Specimen: BLOOD RIGHT FOREARM  Result Value Ref Range Status   Specimen Description BLOOD RIGHT FOREARM  Final   Special Requests   Final    BOTTLES DRAWN AEROBIC AND ANAEROBIC Blood Culture results may not be optimal due to an inadequate volume of blood received in culture bottles   Culture   Final    NO GROWTH 5 DAYS Performed at [facility], [place] [address] [place], [place] [address]    Report Status 02/07/2022 FINAL  Final  MRSA Next Gen by PCR, Nasal     Status: None   Collection Time: 02/02/22  9:21 PM   Specimen: Nasal Mucosa; Nasal Swab  Result Value Ref Range Status   MRSA by PCR Next Gen NOT DETECTED NOT DETECTED Final    Comment: (NOTE) The GeneXpert MRSA Assay (FDA approved for NASAL specimens only), is one component of a comprehensive MRSA colonization surveillance program. It is not intended to diagnose MRSA infection nor to guide or monitor treatment for MRSA infections. Test performance is not FDA approved in patients less than 26 years old. Performed at [facility], [place] [address] [place], [place] [address]   Gram stain     Status: None   Collection Time: 02/12/22 12:12 PM   Specimen: Lung, Left; Pleural Fluid  Result Value Ref Range Status   Specimen Description PLEURAL  Final   Special Requests LEFT LUNG  Final   Gram Stain   Final    WBC PRESENT, PREDOMINANTLY PMN NO ORGANISMS SEEN CYTOSPIN SMEAR Performed at [facility], [place] [address] [place], [place] [address]    Report Status 02/12/2022 FINAL  Final  Culture, body fluid w Gram Stain-bottle     Status: None (Preliminary result)   Collection Time: 02/12/22 12:12 PM   Specimen: Pleura  Result Value Ref Range Status   Specimen Description PLEURAL  Final   Special Requests LEFT LUNG  Final   Culture   Final    NO GROWTH < 24 HOURS Performed at [facility], [place] [address],  [place], [place] [address]  Report Status PENDING  Incomplete    Labs: CBC: Recent Labs  Lab 02/09/22 0138 02/10/22 0220 02/11/22 0246 02/12/22 0444 02/13/22 0344  WBC 8.5 7.9 7.6 7.4 6.8  HGB 9.1* 8.7* 8.7* 8.1* 8.3*  HCT 28.4* 26.9* 26.8* 25.6* 26.0*  MCV 92.2 91.2 91.8 92.8 91.9  PLT 260 274 289 288 014   Basic Metabolic Panel: Recent Labs  Lab 02/09/22 0138 02/10/22 0220 02/11/22 0246 02/12/22 0444 02/13/22 0348  NA 136 133* 133* 137 138  K 4.1 4.1 4.2 4.2 4.5  CL 97* 98 95* 95* 94*  CO2 25 25 24 27 26   GLUCOSE 96 99 101* 80 82  BUN 35* 40* 51* 35* 29*  CREATININE 8.73* 9.50* 11.53* 8.68* 7.45*  CALCIUM 8.9 8.7* 8.8* 8.8* 9.1  MG 2.0  --  2.2  --   --   PHOS 4.8* 5.2* 5.9* 4.9* 5.1*   Liver Function Tests: Recent Labs  Lab 02/09/22 0138 02/10/22 0220 02/11/22 0246 02/12/22 0444 02/12/22 1047 02/13/22 0348  PROT  --   --   --   --  6.0*  --   ALBUMIN 1.9* 1.8* 1.8* 1.8*  --  1.7*   CBG: Recent Labs  Lab 02/12/22 1326 02/12/22 1558 02/12/22 2053 02/13/22 0720 02/13/22 1125  GLUCAP 79 95 95 78 107*    Discharge time spent: less than 30 minutes.  Signed: Marylu Lund, MD [facility] 02/13/2022

## 2022-02-13 NOTE — Progress Notes (Signed)
[facility] Progress Note   Subjective: Seen in room. Says he is going back to rehab today but no orders yet. OP EDW has already been lowered.   Objective Vitals:   02/12/22 2054 02/13/22 0500 02/13/22 0507 02/13/22 0837  BP: 128/77  131/82 132/82  Pulse: 66  64 71  Resp: 18  18 18   Temp: 97.8 F (36.6 C)  98.1 F (36.7 C) 98 F (36.7 C)  TempSrc: Oral  Oral Oral  SpO2: 96%  99% 100%  Weight:  69 kg    Height:       Physical Exam General: Chronically ill appearing male in NAD Heart: S1,S2 RRR No M/R/G  Lungs: CTAB anteriorly. Abdomen: soft, non-distended, NABS Extremities: Trace BLE edema Dialysis Access: L AVF maturing. +T/B. RIJ TDC Drsg intact     Additional Objective Labs: Basic Metabolic Panel: Recent Labs  Lab 02/11/22 0246 02/12/22 0444 02/13/22 0348  NA 133* 137 138  K 4.2 4.2 4.5  CL 95* 95* 94*  CO2 24 27 26   GLUCOSE 101* 80 82  BUN 51* 35* 29*  CREATININE 11.53* 8.68* 7.45*  CALCIUM 8.8* 8.8* 9.1  PHOS 5.9* 4.9* 5.1*   Liver Function Tests: Recent Labs  Lab 02/11/22 0246 02/12/22 0444 02/12/22 1047 02/13/22 0348  PROT  --   --  6.0*  --   ALBUMIN 1.8* 1.8*  --  1.7*   No results for input(s): "LIPASE", "AMYLASE" in the last 168 hours. CBC: Recent Labs  Lab 02/09/22 0138 02/10/22 0220 02/11/22 0246 02/12/22 0444 02/13/22 0344  WBC 8.5 7.9 7.6 7.4 6.8  HGB 9.1* 8.7* 8.7* 8.1* 8.3*  HCT 28.4* 26.9* 26.8* 25.6* 26.0*  MCV 92.2 91.2 91.8 92.8 91.9  PLT 260 274 289 288 298   Blood Culture    Component Value Date/Time   SDES PLEURAL 02/12/2022 1212   SDES PLEURAL 02/12/2022 1212   SPECREQUEST LEFT LUNG 02/12/2022 1212   SPECREQUEST LEFT LUNG 02/12/2022 1212   CULT  02/12/2022 1212    NO GROWTH < 24 HOURS Performed at [facility] [address] [place], [place] [address]    REPTSTATUS 02/12/2022 FINAL 02/12/2022 1212   REPTSTATUS PENDING 02/12/2022 1212    Cardiac Enzymes: No results for input(s):  "CKTOTAL", "CKMB", "CKMBINDEX", "TROPONINI" in the last 168 hours. CBG: Recent Labs  Lab 02/12/22 1326 02/12/22 1558 02/12/22 2053 02/13/22 0720 02/13/22 1125  GLUCAP 79 95 95 78 107*   Iron Studies: No results for input(s): "IRON", "TIBC", "TRANSFERRIN", "FERRITIN" in the last 72 hours. @lablastinr3 @ Studies/Results: IR THORACENTESIS ASP PLEURAL SPACE W/IMG GUIDE  Result Date: 02/12/2022 INDICATION: Patient with history of ESRD on HD admitted for septic shock and CAP. Patient found to have moderate-size pleural effusion on chest x-ray. Request for diagnostic and therapeutic left thoracentesis. EXAM: ULTRASOUND GUIDED DIAGNOSTIC AND THERAPEUTIC LEFT THORACENTESIS MEDICATIONS: 10 mL 1 % lidocaine COMPLICATIONS: None immediate. PROCEDURE: An ultrasound guided thoracentesis was thoroughly discussed with the patient and questions answered. The benefits, risks, alternatives and complications were also discussed. The patient understands and wishes to proceed with the procedure. Written consent was obtained. Ultrasound was performed to localize and mark an adequate pocket of fluid in the left chest. The area was then prepped and draped in the normal sterile fashion. 1% Lidocaine was used for local anesthesia. Under ultrasound guidance a 6 Fr Safe-T-Centesis catheter was introduced. Thoracentesis was performed. The catheter was removed and a dressing applied. FINDINGS: A total of approximately 850 cc  of clear, amber fluid was removed. Samples were sent to the laboratory as requested by the clinical team. IMPRESSION: Successful ultrasound guided left thoracentesis yielding 850 cc of pleural fluid. Read by: Narda Rutherford, AGNP-BC Electronically Signed   By: Markus Daft M.D.   On: 02/12/2022 17:25   DG Chest 1 View  Result Date: 02/12/2022 CLINICAL DATA:  Status post left thoracentesis for pleural effusion. EXAM: CHEST  1 VIEW COMPARISON:  02/11/2022 FINDINGS: Reduced size of the left pleural effusion, with at  least a moderate pleural effusion remaining. Hazy density along the right lung base could reflect layering effusion on the right. Right Port-A-Cath tip: Right atrium. No pneumothorax is observed. Atherosclerotic calcification of the aortic arch. Moderate enlargement of the cardiopericardial silhouette, without edema. IMPRESSION: 1. Reduced size of the left pleural effusion, no pneumothorax. 2. Possible layering right pleural effusion. 3. Moderate enlargement of the cardiopericardial silhouette, without edema. 4.  Aortic Atherosclerosis (ICD10-I70.0). Electronically Signed   By: Van Clines M.D.   On: 02/12/2022 12:20   DG CHEST PORT 1 VIEW  Result Date: 02/11/2022 CLINICAL DATA:  Pleural effusion EXAM: PORTABLE CHEST 1 VIEW COMPARISON:  Previous studies including the examination of 02/02/2022 FINDINGS: Heart is enlarged in size. Tip of right IJ dialysis catheter is seen in the right atrium. Central pulmonary vessels are prominent. There is increased haziness in the left upper lung fields and right parahilar region. Small to moderate bilateral pleural effusions are seen, more so on the left side with interval increase. Evaluation of left lower lung fields for infiltrates is limited by the effusion. There is no pneumothorax. IMPRESSION: Cardiomegaly. Central pulmonary vessels are prominent suggesting CHF. Small to moderate bilateral pleural effusions, more so on the left side with interval increase. Electronically Signed   By: Elmer Picker M.D.   On: 02/11/2022 13:57   Medications:  sodium chloride Stopped (02/02/22 2322)    (feeding supplement) PROSource Plus  30 mL Oral BID BM   sodium chloride   Intravenous Once   amiodarone  200 mg Oral BID   apixaban  5 mg Oral BID   atorvastatin  40 mg Oral QHS   calcitRIOL  0.25 mcg Oral Q T,Th,Sa-HD   Chlorhexidine Gluconate Cloth  6 each Topical Q0600   darbepoetin (ARANESP) injection - DIALYSIS  150 mcg Intravenous Q Thu-HD   diclofenac Sodium   2 g Topical QID   diltiazem  180 mg Oral Daily   docusate sodium  100 mg Oral BID   feeding supplement (NEPRO CARB STEADY)  237 mL Oral TID BM   insulin aspart  0-5 Units Subcutaneous QHS   insulin aspart  0-6 Units Subcutaneous TID WC   midodrine  10 mg Oral Q T,Th,Sa-HD   multivitamin  1 tablet Oral QHS   sevelamer carbonate  1,600 mg Oral TID WC     HD orders: [place] T,Th,S 4 hours 180NRe 400/800 70 kg 2.0K/2.5 Ca TDC -No Heparin   Assessment/Plan:    #: Undifferentiated Shock - per CCM, suspected possibly d/t new BP / afib medications. All BP lowering meds were held on admission.   afebrile, blood cultures NGTD but low volume samples Completed Cefepime for 5 doses.    #: AMS - resolved. Oriented X 3.   #: Pleural Effusion still present on CXR 02/11/2022. S/P thoracentesis 02/12/2022 850 cc. Per primary.    #: ESRD - recent start on 6/08. Has TDC, also L 1st stage BVT AVF done 6/12 by Dr Scot Dock. On  T,Th,S Schedule. Next HD 02/14/2022 Hopefully at OP center.    #: AFib- Has been SR on monitor. Apparently had episode of HR 150s  and HD was terminated last week. He is on amiodarone, diltiazem and apixaban. SR on monitor.    #: Volume - Repeat CXR 02/11/2022. Central pulmonary vessels are prominent suggesting CHF. Started midodrine 10 mg PO TIW prior ot HD. Net UF 2 liters with HD 02/12/2022. Lower OP EDW to 69 kg and continue challenging EDW at OP center.    #: Anemia-HGB 8.3 06/272023. S/P 2 units of PRBCs 02/05/2021. On weekly ESA, completed Fe load.    #: CKD BMD-Corrected Ca+ 10.8. Was getting both daily calcitriol and TIW calcitriol with HD! DC VDRA until Calcium improved.  Changed binders to renvela 800 mg 2 tabs PO TID AC. Marland Kitchen PTH 447. Corrected Ca+ prohibitive of increasing VDRA.    #: Nutrition-Albumin low. On regular diet. Add protein supplementation.     Linford Quintela H. Sladen Plancarte NP-C 02/13/2022, 11:58 AM  [employer] [phone]

## 2022-02-17 LAB — CULTURE, BODY FLUID W GRAM STAIN -BOTTLE: Culture: NO GROWTH

## 2022-03-04 ENCOUNTER — Ambulatory Visit (INDEPENDENT_AMBULATORY_CARE_PROVIDER_SITE_OTHER): Payer: Self-pay | Admitting: Nurse Practitioner

## 2022-03-04 ENCOUNTER — Encounter: Payer: Self-pay | Admitting: Nurse Practitioner

## 2022-03-04 VITALS — BP 140/82 | HR 82 | Ht 71.0 in | Wt 163.0 lb

## 2022-03-04 DIAGNOSIS — N186 End stage renal disease: Secondary | ICD-10-CM

## 2022-03-04 DIAGNOSIS — Z992 Dependence on renal dialysis: Secondary | ICD-10-CM

## 2022-03-04 DIAGNOSIS — I480 Paroxysmal atrial fibrillation: Secondary | ICD-10-CM

## 2022-03-04 DIAGNOSIS — I10 Essential (primary) hypertension: Secondary | ICD-10-CM

## 2022-03-04 DIAGNOSIS — R0789 Other chest pain: Secondary | ICD-10-CM

## 2022-03-04 NOTE — Patient Instructions (Signed)
Medication Instructions:  Your physician recommends that you continue on your current medications as directed. Please refer to the Current Medication list given to you today.   *If you need a refill on your cardiac medications before your next appointment, please call your pharmacy*   Lab Work: NONE ordered at this time of appointment   If you have labs (blood work) drawn today and your tests are completely normal, you will receive your results only by: [place] (if you have MyChart) OR A paper copy in the mail If you have any lab test that is abnormal or we need to change your treatment, we will call you to review the results.   Testing/Procedures: NONE ordered at this time of appointment     Follow-Up: At [facility], you and your health needs are our priority.  As part of our continuing mission to provide you with exceptional heart care, we have created designated Provider Care Teams.  These Care Teams include your primary Cardiologist (physician) and Advanced Practice Providers (APPs -  Physician Assistants and Nurse Practitioners) who all work together to provide you with the care you need, when you need it.  We recommend signing up for the patient portal called "MyChart".  Sign up information is provided on this After Visit Summary.  MyChart is used to connect with patients for Virtual Visits (Telemedicine).  Patients are able to view lab/test results, encounter notes, upcoming appointments, etc.  Non-urgent messages can be sent to your provider as well.   To learn more about what you can do with MyChart, go to [email or web address].    Your next appointment:   3-4 month(s)  The format for your next appointment:   In Person  Provider:   Quay Burow, MD     Other Instructions Please follow up with Primary Care Doctor [facility]) Please follow up with Nephrology.   Important Information About Sugar

## 2022-03-04 NOTE — Progress Notes (Signed)
Office Visit    Patient Name: Tanner Rollins Date of Encounter: 03/04/2022  Primary Care Provider:  Pcp, No Primary Cardiologist:  Quay Burow, MD  Chief Complaint    71 year old male with a history of paroxysmal atrial fibrillation, hypertension, and ESRD on HD who presents for posthospital follow-up related to atrial fibrillation and hypertension.  Past Medical History    Past Medical History:  Diagnosis Date   ESRD on hemodialysis ([place])    Hypertension    Past Surgical History:  Procedure Laterality Date   AV FISTULA PLACEMENT Left 01/28/2022   Procedure: ARTERIOVENOUS (AV) FISTULA CREATION VS.GRAFT ARM;  Surgeon: Angelia Mould, MD;  Location: [facility] OR;  Service: Vascular;  Laterality: Left;   IR FLUORO GUIDE CV LINE RIGHT  01/24/2022   IR THORACENTESIS ASP PLEURAL SPACE W/IMG GUIDE  02/12/2022   IR US GUIDE VASC ACCESS RIGHT  01/24/2022    Allergies  No Known Allergies  History of Present Illness    71 year old male with the above past medical history including paroxysmal atrial fibrillation, hypertension, and ESRD on HD.  He was initially evaluated by cardiology during a hospitalization in early June in the setting of hypertensive urgency due to medication nonadherence in the setting of financial constraints.  His troponin was 11/05/2021.  BP improved with IV nitroglycerin and Cardene by resumption of carvedilol, hydralazine, and amlodipine. Additionally, he was treated for aspiration pneumonia.  Echocardiogram showed EF 55 to 60%, no RWMA, LVH, G1 DD, currently elevated PASP, severely dilated left atrium, mildly dilated right atrium.  During his hospitalization he had an episode of atrial fibrillation, he converted to normal sinus rhythm with IV diltiazem.  He was started on Eliquis, carvedilol, and diltiazem.  Amlodipine was stopped in the setting of hypotension.  He was discharged home in stable condition on 01/31/2022.  He returned to the ED on 02/02/2022 following a  hypotensive episode during dialysis.  He responded to fluid volume resuscitation, vasopressors, beta-blocker and calcium channel blocker were held.  He was hospitalized from 02/02/2022 to 02/13/2022 in the setting of acute metabolic encephalopathy, septic shock due to unity acquired pneumonia.  Chest x-ray showed moderate left pleural effusion.  He was treated with antibiotics underwent ultrasound-guided left thoracentesis.  Additionally, he received 2 units PRBC and IV iron due to acute on chronic anemia.  Cardiology was consulted in the setting of recurrent atrial fibrillation during a dialysis session, atypical chest pain.  Troponin was flat, EKG without concerning changes.  He was started on amiodarone 200 mg twice daily x2 weeks, followed by 200 mg daily.  He was discharged to a skilled nursing facility in stable condition on 02/13/2022.  He presents today for follow-up.  Since his hospitalization he has been stable from a cardiac standpoint.  He denies any symptoms concerning for angina, denies palpitations, dizziness, worsening dyspnea, PND, orthopnea, edema.  Overall, he reports feeling well denies any new concerns today.  Home Medications    Current Outpatient Medications  Medication Sig Dispense Refill   amiodarone (PACERONE) 200 MG tablet Take 1 tablet (200 mg total) by mouth daily. 30 tablet 0   apixaban (ELIQUIS) 5 MG TABS tablet Take 1 tablet (5 mg total) by mouth 2 (two) times daily. 60 tablet    atorvastatin (LIPITOR) 40 MG tablet Take 40 mg by mouth at bedtime.     calcium acetate (PHOSLO) 667 MG capsule Take 2 capsules (1,334 mg total) by mouth 3 (three) times daily with meals.  diltiazem (CARDIZEM CD) 180 MG 24 hr capsule Take 1 capsule (180 mg total) by mouth daily. (Patient taking differently: Take 180 mg by mouth every evening.)     docusate sodium (COLACE) 100 MG capsule Take 1 capsule (100 mg total) by mouth 2 (two) times daily. 10 capsule 0   midodrine (PROAMATINE) 10 MG tablet  Take 1 tablet (10 mg total) by mouth Every Tuesday,Thursday,and Saturday with dialysis. 12 tablet 0   Nutritional Supplements (FEEDING SUPPLEMENT, NEPRO CARB STEADY,) LIQD Take 237 mLs by mouth every evening.     polyethylene glycol (MIRALAX / GLYCOLAX) 17 g packet Take 17 g by mouth daily as needed for moderate constipation. 14 each 0   sevelamer carbonate (RENVELA) 800 MG tablet Take 2 tablets (1,600 mg total) by mouth 3 (three) times daily with meals. 180 tablet 0   No current facility-administered medications for this visit.     Review of Systems    He denies chest pain, palpitations, dyspnea, pnd, orthopnea, n, v, dizziness, syncope, edema, weight gain, or early satiety. All other systems reviewed and are otherwise negative except as noted above.   Physical Exam    VS:  BP 140/82   Pulse 82   Ht 5\' 11"  (1.803 m)   Wt 163 lb (73.9 kg)   SpO2 96%   BMI 22.73 kg/m  GEN: Well nourished, well developed, in no acute distress. HEENT: normal. Neck: Supple, no JVD, carotid bruits, or masses. Cardiac: RRR, no murmurs, rubs, or gallops. No clubbing, cyanosis, edema.  Radials/DP/PT 2+ and equal bilaterally.  Respiratory:  Respirations regular and unlabored, clear to auscultation bilaterally. GI: Soft, nontender, nondistended, BS + x 4. MS: no deformity or atrophy. Skin: warm and dry, no rash. Neuro:  Strength and sensation are intact. Psych: Normal affect.  Accessory Clinical Findings    ECG personally reviewed by me today -sinus rhythm, 84 bpm, nonspecific ST/T wave changes- no acute changes.  Lab Results  Component Value Date   WBC 6.8 02/13/2022   HGB 8.3 (L) 02/13/2022   HCT 26.0 (L) 02/13/2022   MCV 91.9 02/13/2022   PLT 298 02/13/2022   Lab Results  Component Value Date   CREATININE 7.45 (H) 02/13/2022   BUN 29 (H) 02/13/2022   NA 138 02/13/2022   K 4.5 02/13/2022   CL 94 (L) 02/13/2022   CO2 26 02/13/2022   Lab Results  Component Value Date   ALT 33 02/02/2022    AST 117 (H) 02/02/2022   ALKPHOS 89 02/02/2022   BILITOT 1.1 02/02/2022   Lab Results  Component Value Date   CHOL 191 03/11/2021   HDL 45 03/11/2021   LDLCALC 125 (H) 03/11/2021   TRIG 103 03/11/2021   CHOLHDL 4.2 03/11/2021    Lab Results  Component Value Date   HGBA1C 5.4 02/02/2022    Assessment & Plan    1. Paroxysmal atrial fibrillation: Diagnosed in early June 2023. Echo showed EF 55 to 60%, no RWMA, LVH, G1 DD, currently elevated PASP, severely dilated left atrium, mildly dilated right atrium. Documented recurrence during recent hospitalization for septic shock in the setting of pneumonia.  Not an ideal candidate for DCCV given severely dilated left atrium.  Started on amiodarone.  Maintaining NSR.  Denies bleeding on Eliquis.  Continue amiodarone, diltiazem, Eliquis.  2. Hypertension: BP initially elevated upon arrival today, recheck 140/82. Continue to monitor BP and report BP consistently > 130/80.  For now, continue current antihypertensive regimen.  3. Atypical chest pain:  Occurred during recent hospitalization. Troponin with flat trend, EKG without acute changes. Stable with no anginal symptoms.  No indication for ischemic evaluation at this time.  4. ESRD on HD: Following with nephrology.  5. Disposition: Follow-up in 3-4 months with Dr. Gwenlyn Found.   Lenna Sciara, NP 03/04/2022, 12:16 PM

## 2022-03-14 ENCOUNTER — Encounter (HOSPITAL_COMMUNITY): Payer: Self-pay

## 2022-03-27 ENCOUNTER — Ambulatory Visit: Payer: Self-pay | Admitting: Nurse Practitioner

## 2022-03-28 ENCOUNTER — Emergency Department (HOSPITAL_COMMUNITY)
Admission: EM | Admit: 2022-03-28 | Discharge: 2022-03-28 | Disposition: A | Discharge status: Home / Self Care | Payer: Self-pay | Attending: Emergency Medicine | Admitting: Emergency Medicine

## 2022-03-28 ENCOUNTER — Encounter (HOSPITAL_COMMUNITY): Payer: Self-pay | Admitting: Emergency Medicine

## 2022-03-28 ENCOUNTER — Other Ambulatory Visit: Payer: Self-pay

## 2022-03-28 DIAGNOSIS — D631 Anemia in chronic kidney disease: Secondary | ICD-10-CM | POA: Insufficient documentation

## 2022-03-28 DIAGNOSIS — I120 Hypertensive chronic kidney disease with stage 5 chronic kidney disease or end stage renal disease: Secondary | ICD-10-CM | POA: Insufficient documentation

## 2022-03-28 DIAGNOSIS — Z79899 Other long term (current) drug therapy: Secondary | ICD-10-CM | POA: Insufficient documentation

## 2022-03-28 DIAGNOSIS — Z7901 Long term (current) use of anticoagulants: Secondary | ICD-10-CM | POA: Insufficient documentation

## 2022-03-28 DIAGNOSIS — Z992 Dependence on renal dialysis: Secondary | ICD-10-CM | POA: Insufficient documentation

## 2022-03-28 DIAGNOSIS — N186 End stage renal disease: Secondary | ICD-10-CM | POA: Insufficient documentation

## 2022-03-28 LAB — TYPE AND SCREEN
ABO/RH(D): A POS
Antibody Screen: NEGATIVE

## 2022-03-28 LAB — BASIC METABOLIC PANEL
Anion gap: 10 (ref 5–15)
BUN: 10 mg/dL (ref 8–23)
CO2: 29 mmol/L (ref 22–32)
Calcium: 8.9 mg/dL (ref 8.9–10.3)
Chloride: 99 mmol/L (ref 98–111)
Creatinine, Ser: 3.28 mg/dL — ABNORMAL HIGH (ref 0.61–1.24)
GFR, Estimated: 20 mL/min — ABNORMAL LOW (ref >60)
Glucose, Bld: 86 mg/dL (ref 70–99)
Potassium: 3 mmol/L — ABNORMAL LOW (ref 3.5–5.1)
Sodium: 138 mmol/L (ref 135–145)

## 2022-03-28 LAB — CBC
HCT: 23.4 % — ABNORMAL LOW (ref 39.0–52.0)
Hemoglobin: 7.2 g/dL — ABNORMAL LOW (ref 13.0–17.0)
MCH: 27.9 pg (ref 26.0–34.0)
MCHC: 30.8 g/dL (ref 30.0–36.0)
MCV: 90.7 fL (ref 80.0–100.0)
Platelets: 152 10*3/uL (ref 150–400)
RBC: 2.58 MIL/uL — ABNORMAL LOW (ref 4.22–5.81)
RDW: 19.8 % — ABNORMAL HIGH (ref 11.5–15.5)
WBC: 5.7 10*3/uL (ref 4.0–10.5)
nRBC: 0 % (ref 0.0–0.2)

## 2022-03-28 LAB — OCCULT BLOOD X 1 CARD TO LAB, STOOL: Fecal Occult Bld: NEGATIVE

## 2022-03-28 MED ORDER — POTASSIUM CHLORIDE CRYS ER 20 MEQ PO TBCR
60.0000 meq | EXTENDED_RELEASE_TABLET | Freq: Once | ORAL | Status: AC
  Administered 2022-03-28: 60 meq via ORAL
  Filled 2022-03-28: qty 3

## 2022-03-28 NOTE — Discharge Instructions (Addendum)
Return to the ED with any new or worsening symptoms Please present your CBC which I printed off to you at dialysis on Saturday Please read attached guide concerning chronic kidney disease

## 2022-03-28 NOTE — ED Notes (Signed)
Daughter is here to transport patient. Pt given CBC results to provide to dialysis next treatment. Pt educated on DC papers and has no questions.

## 2022-03-28 NOTE — ED Provider Notes (Signed)
[facility] EMERGENCY DEPARTMENT Provider Note   CSN: [id number] Arrival date & time: 03/28/22  1558     History  Chief Complaint  Patient presents with   Abnormal Lab    Tanner Rollins is a 71 y.o. male with medical history significant for end-stage renal disease on dialysis, hypertension.  The patient presents to the ED for evaluation of 11.  Patient reports that he was at dialysis earlier today, patient states that he had CBC ran and was noted to have a hemoglobin of 6.  The patient states he goes to dialysis on Tuesday, Thursday and Saturday.  Patient reports that he had a full session today.  The patient denies any recent chest pain, shortness of breath, lightheadedness, dizziness, weakness, blood in stool.  Patient does take Eliquis.   Abnormal Lab      Home Medications Prior to Admission medications   Medication Sig Start Date End Date Taking? Authorizing Provider  amiodarone (PACERONE) 200 MG tablet Take 1 tablet (200 mg total) by mouth daily. 02/23/22 03/25/22  Donne Hazel, MD  apixaban (ELIQUIS) 5 MG TABS tablet Take 1 tablet (5 mg total) by mouth 2 (two) times daily. 01/31/22   Shelly Coss, MD  atorvastatin (LIPITOR) 40 MG tablet Take 40 mg by mouth at bedtime.    [provider]  calcium acetate (PHOSLO) 667 MG capsule Take 2 capsules (1,334 mg total) by mouth 3 (three) times daily with meals. 01/31/22   Shelly Coss, MD  diltiazem (CARDIZEM CD) 180 MG 24 hr capsule Take 1 capsule (180 mg total) by mouth daily. Patient taking differently: Take 180 mg by mouth every evening. 02/01/22   Shelly Coss, MD  docusate sodium (COLACE) 100 MG capsule Take 1 capsule (100 mg total) by mouth 2 (two) times daily. 01/31/22   Shelly Coss, MD  Nutritional Supplements (FEEDING SUPPLEMENT, NEPRO CARB STEADY,) LIQD Take 237 mLs by mouth every evening.    [provider]  polyethylene glycol (MIRALAX / GLYCOLAX) 17 g packet Take 17 g by mouth  daily as needed for moderate constipation. 01/31/22   Shelly Coss, MD      Allergies    Patient has no known allergies.    Review of Systems   Review of Systems  Constitutional:  Negative for chills and fever.  Respiratory:  Negative for shortness of breath.   Cardiovascular:  Negative for chest pain.  Gastrointestinal:  Negative for abdominal pain, blood in stool, diarrhea, nausea and vomiting.  Neurological:  Negative for dizziness, weakness and light-headedness.  All other systems reviewed and are negative.   Physical Exam Updated Vital Signs BP (!) 169/88   Pulse 71   Temp 98.2 F (36.8 C) (Oral)   Resp 11   Ht 5\' 11"  (1.803 m)   Wt 68 kg   SpO2 96%   BMI 20.92 kg/m  Physical Exam Vitals and nursing note reviewed.  Constitutional:      General: He is not in acute distress.    Appearance: He is not ill-appearing, toxic-appearing or diaphoretic.  HENT:     Head: Normocephalic and atraumatic.     Nose: Nose normal. No congestion.     Mouth/Throat:     Mouth: Mucous membranes are moist.     Pharynx: Oropharynx is clear.  Eyes:     Extraocular Movements: Extraocular movements intact.     Conjunctiva/sclera: Conjunctivae normal.     Pupils: Pupils are equal, round, and reactive to light.  Cardiovascular:  Rate and Rhythm: Normal rate and regular rhythm.  Pulmonary:     Effort: Pulmonary effort is normal.     Breath sounds: Normal breath sounds. No wheezing.  Abdominal:     General: Abdomen is flat. Bowel sounds are normal.     Palpations: Abdomen is soft.     Tenderness: There is no abdominal tenderness.  Musculoskeletal:     Cervical back: Normal range of motion and neck supple. No tenderness.  Skin:    General: Skin is warm and dry.     Capillary Refill: Capillary refill takes less than 2 seconds.  Neurological:     Mental Status: He is alert and oriented to person, place, and time.     ED Results / Procedures / Treatments   Labs (all labs ordered  are listed, but only abnormal results are displayed) Labs Reviewed  CBC - Abnormal; Notable for the following components:      Result Value   RBC 2.58 (*)    Hemoglobin 7.2 (*)    HCT 23.4 (*)    RDW 19.8 (*)    All other components within normal limits  BASIC METABOLIC PANEL - Abnormal; Notable for the following components:   Potassium 3.0 (*)    Creatinine, Ser 3.28 (*)    GFR, Estimated 20 (*)    All other components within normal limits  OCCULT BLOOD X 1 CARD TO LAB, STOOL  TYPE AND SCREEN    EKG None  Radiology No results found.  Procedures Procedures   Medications Ordered in ED Medications  potassium chloride SA (KLOR-CON M) CR tablet 60 mEq (60 mEq Oral Given 03/28/22 1746)    ED Course/ Medical Decision Making/ A&P                           Medical Decision Making Amount and/or Complexity of Data Reviewed Labs: ordered.   71 year old male presents to the ED for evaluation.  Please see HPI for further details.  On examination, the patient is afebrile and nontachycardic.  The patient lung sounds are clear bilaterally, he is not hypoxic on room air.  Patient abdomen is soft and compressible.  The patient neurological examination shows no focal neurodeficits.  Patient worked up utilizing the following labs interpreted by me personally: - BMP with decreased potassium to 3.0 repleted with 60 mEq potassium.  Patient creatinine at baseline - CBC shows hemoglobin of 7.2, not lower than 7 - Hemoccult negative - Type and screen and positive  Dr. Posey Pronto, nephrology, was consulted for this patient.  Dr. Posey Pronto states that since this patient's hemoglobin is above 7, he is asymptomatic there is no need to transfuse.  Dr. Posey Pronto states that this patient can be discharged home and can proceed to dialysis on Saturday.  Patient is agreeable to this plan.  The patient has been given return precautions which she has voiced understanding with.  The patient has had all of his  questions answered to satisfaction.  The patient and his sister who is at the bedside have had all of their questions answered.  The patient is stable at this time for discharge home.  Final Clinical Impression(s) / ED Diagnoses Final diagnoses:  Anemia due to chronic kidney disease, on chronic dialysis [facility])    Rx / DC Orders ED Discharge Orders     None         Lawana Chambers 03/28/22 1837    Dorie Rank,  MD 03/28/22 2314

## 2022-03-28 NOTE — ED Triage Notes (Signed)
Pt bib gcems from dialysis where he completed full session. Pt hgb noted to be 6.6 at dialysis. No complaints from patient. Dialysis on Tuesday, Thursday, Saturday with no missed sessions.   BP 180/100, HR 72, RR 17, Spo2 97%

## 2022-05-20 ENCOUNTER — Other Ambulatory Visit: Payer: Self-pay

## 2022-05-20 ENCOUNTER — Ambulatory Visit (HOSPITAL_COMMUNITY)
Admission: RE | Admit: 2022-05-20 | Discharge: 2022-05-20 | Disposition: A | Discharge status: Home / Self Care | Payer: No Typology Code available for payment source | Source: Ambulatory Visit | Attending: Vascular Surgery | Admitting: Vascular Surgery

## 2022-05-20 ENCOUNTER — Ambulatory Visit (INDEPENDENT_AMBULATORY_CARE_PROVIDER_SITE_OTHER): Payer: Self-pay | Admitting: Physician Assistant

## 2022-05-20 VITALS — BP 188/91 | HR 67 | Temp 97.5°F | Resp 16 | Ht 72.0 in | Wt 146.0 lb

## 2022-05-20 DIAGNOSIS — N186 End stage renal disease: Secondary | ICD-10-CM

## 2022-05-20 DIAGNOSIS — Z992 Dependence on renal dialysis: Secondary | ICD-10-CM

## 2022-05-20 DIAGNOSIS — N184 Chronic kidney disease, stage 4 (severe): Secondary | ICD-10-CM | POA: Diagnosis present

## 2022-05-20 NOTE — H&P (View-Only) (Signed)
Established Dialysis Access   History of Present Illness   Tanner Rollins is a 71 y.o. (04-30-1951) male who presents for post op visit. He underwent a left arm 1 stage basilic vein transposition on 01/28/2022 by Dr. Scot Dock.  This was done for permanent dialysis access.  He previously missed his 6-week follow-up with our office.  His dialysis center has used his left arm fistula for 2 sessions so far without issue.  He denies any symptoms of steal such as: cold hands/fingers, hand pain, or issues with grip strength.  He endorses no issues with the healing process of his fistula.  He currently dialyzes on T,Th, Sat.  He still has a right IJ TDC. Current Outpatient Medications  Medication Sig Dispense Refill   apixaban (ELIQUIS) 5 MG TABS tablet Take 1 tablet (5 mg total) by mouth 2 (two) times daily. 60 tablet    atorvastatin (LIPITOR) 40 MG tablet Take 40 mg by mouth at bedtime.     calcium acetate (PHOSLO) 667 MG capsule Take 2 capsules (1,334 mg total) by mouth 3 (three) times daily with meals.     diltiazem (CARDIZEM CD) 180 MG 24 hr capsule Take 1 capsule (180 mg total) by mouth daily. (Patient taking differently: Take 180 mg by mouth every evening.)     docusate sodium (COLACE) 100 MG capsule Take 1 capsule (100 mg total) by mouth 2 (two) times daily. 10 capsule 0   Nutritional Supplements (FEEDING SUPPLEMENT, NEPRO CARB STEADY,) LIQD Take 237 mLs by mouth every evening.     polyethylene glycol (MIRALAX / GLYCOLAX) 17 g packet Take 17 g by mouth daily as needed for moderate constipation. 14 each 0   amiodarone (PACERONE) 200 MG tablet Take 1 tablet (200 mg total) by mouth daily. 30 tablet 0   No current facility-administered medications for this visit.    REVIEW OF SYSTEMS (negative unless checked):   Cardiac:  []  Chest pain or chest pressure? []  Shortness of breath upon activity? []  Shortness of breath when lying flat? []  Irregular heart rhythm?  Vascular:  []  Pain in  calf, thigh, or hip brought on by walking? []  Pain in feet at night that wakes you up from your sleep? []  Blood clot in your veins? []  Leg swelling?  Pulmonary:  []  Oxygen at home? []  Productive cough? []  Wheezing?  Neurologic:  []  Sudden weakness in arms or legs? []  Sudden numbness in arms or legs? []  Sudden onset of difficult speaking or slurred speech? []  Temporary loss of vision in one eye? []  Problems with dizziness?  Gastrointestinal:  []  Blood in stool? []  Vomited blood?  Genitourinary:  []  Burning when urinating? []  Blood in urine?  Psychiatric:  []  Major depression  Hematologic:  []  Bleeding problems? []  Problems with blood clotting?  Dermatologic:  []  Rashes or ulcers?  Constitutional:  []  Fever or chills?  Ear/Nose/Throat:  []  Change in hearing? []  Nose bleeds? []  Sore throat?  Musculoskeletal:  []  Back pain? []  Joint pain? []  Muscle pain?   Physical Examination   Vitals:   05/20/22 1416 05/20/22 1421  BP: (!) 200/93 (!) 188/91  Pulse: 67 67  Resp: 16   Temp: (!) 97.5 F (36.4 C)   TempSrc: Temporal   SpO2: 100%   Weight: 146 lb (66.2 kg)   Height: 6' (1.829 m)    Body mass index is 19.8 kg/m.  General:  WDWN in NAD; vital signs documented above Gait: Not observed HENT: WNL, normocephalic Pulmonary: normal  non-labored breathing , without rales, rhonchi,  wheezing Cardiac: regularly irregular HR, without murmurs without carotid bruit Abdomen: soft, NT, no masses Skin: without rashes.  Incisions are well-healed Vascular Exam/Pulses: Palpable left radial and brachial pulse. Good thrill of left basilic vein fistula with some pulsatility. Visual size reduction at the distal portion of the fistula. Minimal swelling at most distal portion of fistula with a pulsatile, engorged branch along the medial bicep.  Extremities: without ischemic changes, without Gangrene , without cellulitis; without open wounds;  Musculoskeletal: no muscle  wasting or atrophy  Neurologic: A&O X 3;  No focal weakness or paresthesias are detected Psychiatric:  The pt has Normal affect.   Non-invasive Vascular Imaging   Left Arm Access Duplex  (05/20/2022):   AVF                 PSV (cm/s)Flow Vol (mL/min)Comments  +--------------------+----------+-----------------+--------+  Native artery inflow    79          1972                 +--------------------+----------+-----------------+--------+  AVF Anastomosis        581                               +--------------------+----------+-----------------+--------+      +------------+----------+-------------+---------+--------------------------  ----+  OUTFLOW VEINPSV (cm/s)Diameter (cm)  Depth             Describe                                                   (cm)                                     +------------+----------+-------------+---------+--------------------------  ----+  Prox UA        207        0.64       0.34                                     +------------+----------+-------------+---------+--------------------------  ----+  Mid UA         761        0.45       0.36   mild change in vessel  diameter  +------------+----------+-------------+---------+--------------------------  ----+  Dist UA        569        0.58       0.57                                     +------------+----------+-------------+---------+--------------------------  ----+     Medical Decision Making   Tanner Rollins is a 71 y.o. male who presents for post op visit of left 1 stage basilic vein transposition on 01/28/2022 by Dr.Dickson  - His left arm incisions are well-healed.  He has palpable radial and brachial pulses. - Duplex study of the left basilic fistula matured, patent fistula.  The fistula does have an area of stenosis and reduced diameter in the mid upper arm.  This area has an elevated  velocity of 761.  This area of reduced diameter can  also be seen on exam. - There is swelling in the most distal portion of the fistula, likely due to the area of stenosis within the fistula.  There is also an engorged branch off the fistula that has pulsatile flow throughout. -The patient has had 2 successful dialysis sessions so far with the fistula.  However there is concern for the longevity of the fistula due to the engorged branch and noticeable area of stenosis.  -He will be scheduled for a left arm fistula branch ligation with intra-op fistulogram within the next few weeks on a nondialysis day. The patient is agreeable to another surgery to improve the flow throughout his fistula.    Vicente Serene PA-C [facility] Office: [phone]  Clinic MD: Trula Slade

## 2022-05-20 NOTE — Progress Notes (Signed)
Established Dialysis Access   History of Present Illness   Tanner Rollins is a 71 y.o. (1950-11-10) male who presents for post op visit. He underwent a left arm 1 stage basilic vein transposition on 01/28/2022 by Dr. Scot Dock.  This was done for permanent dialysis access.  He previously missed his 6-week follow-up with our office.  His dialysis center has used his left arm fistula for 2 sessions so far without issue.  He denies any symptoms of steal such as: cold hands/fingers, hand pain, or issues with grip strength.  He endorses no issues with the healing process of his fistula.  He currently dialyzes on T,Th, Sat.  He still has a right IJ TDC. Current Outpatient Medications  Medication Sig Dispense Refill   apixaban (ELIQUIS) 5 MG TABS tablet Take 1 tablet (5 mg total) by mouth 2 (two) times daily. 60 tablet    atorvastatin (LIPITOR) 40 MG tablet Take 40 mg by mouth at bedtime.     calcium acetate (PHOSLO) 667 MG capsule Take 2 capsules (1,334 mg total) by mouth 3 (three) times daily with meals.     diltiazem (CARDIZEM CD) 180 MG 24 hr capsule Take 1 capsule (180 mg total) by mouth daily. (Patient taking differently: Take 180 mg by mouth every evening.)     docusate sodium (COLACE) 100 MG capsule Take 1 capsule (100 mg total) by mouth 2 (two) times daily. 10 capsule 0   Nutritional Supplements (FEEDING SUPPLEMENT, NEPRO CARB STEADY,) LIQD Take 237 mLs by mouth every evening.     polyethylene glycol (MIRALAX / GLYCOLAX) 17 g packet Take 17 g by mouth daily as needed for moderate constipation. 14 each 0   amiodarone (PACERONE) 200 MG tablet Take 1 tablet (200 mg total) by mouth daily. 30 tablet 0   No current facility-administered medications for this visit.    REVIEW OF SYSTEMS (negative unless checked):   Cardiac:  []  Chest pain or chest pressure? []  Shortness of breath upon activity? []  Shortness of breath when lying flat? []  Irregular heart rhythm?  Vascular:  []  Pain in  calf, thigh, or hip brought on by walking? []  Pain in feet at night that wakes you up from your sleep? []  Blood clot in your veins? []  Leg swelling?  Pulmonary:  []  Oxygen at home? []  Productive cough? []  Wheezing?  Neurologic:  []  Sudden weakness in arms or legs? []  Sudden numbness in arms or legs? []  Sudden onset of difficult speaking or slurred speech? []  Temporary loss of vision in one eye? []  Problems with dizziness?  Gastrointestinal:  []  Blood in stool? []  Vomited blood?  Genitourinary:  []  Burning when urinating? []  Blood in urine?  Psychiatric:  []  Major depression  Hematologic:  []  Bleeding problems? []  Problems with blood clotting?  Dermatologic:  []  Rashes or ulcers?  Constitutional:  []  Fever or chills?  Ear/Nose/Throat:  []  Change in hearing? []  Nose bleeds? []  Sore throat?  Musculoskeletal:  []  Back pain? []  Joint pain? []  Muscle pain?   Physical Examination   Vitals:   05/20/22 1416 05/20/22 1421  BP: (!) 200/93 (!) 188/91  Pulse: 67 67  Resp: 16   Temp: (!) 97.5 F (36.4 C)   TempSrc: Temporal   SpO2: 100%   Weight: 146 lb (66.2 kg)   Height: 6' (1.829 m)    Body mass index is 19.8 kg/m.  General:  WDWN in NAD; vital signs documented above Gait: Not observed HENT: WNL, normocephalic Pulmonary: normal  non-labored breathing , without rales, rhonchi,  wheezing Cardiac: regularly irregular HR, without murmurs without carotid bruit Abdomen: soft, NT, no masses Skin: without rashes.  Incisions are well-healed Vascular Exam/Pulses: Palpable left radial and brachial pulse. Good thrill of left basilic vein fistula with some pulsatility. Visual size reduction at the distal portion of the fistula. Minimal swelling at most distal portion of fistula with a pulsatile, engorged branch along the medial bicep.  Extremities: without ischemic changes, without Gangrene , without cellulitis; without open wounds;  Musculoskeletal: no muscle  wasting or atrophy  Neurologic: A&O X 3;  No focal weakness or paresthesias are detected Psychiatric:  The pt has Normal affect.   Non-invasive Vascular Imaging   Left Arm Access Duplex  (05/20/2022):   AVF                 PSV (cm/s)Flow Vol (mL/min)Comments  +--------------------+----------+-----------------+--------+  Native artery inflow    79          1972                 +--------------------+----------+-----------------+--------+  AVF Anastomosis        581                               +--------------------+----------+-----------------+--------+      +------------+----------+-------------+---------+--------------------------  ----+  OUTFLOW VEINPSV (cm/s)Diameter (cm)  Depth             Describe                                                   (cm)                                     +------------+----------+-------------+---------+--------------------------  ----+  Prox UA        207        0.64       0.34                                     +------------+----------+-------------+---------+--------------------------  ----+  Mid UA         761        0.45       0.36   mild change in vessel  diameter  +------------+----------+-------------+---------+--------------------------  ----+  Dist UA        569        0.58       0.57                                     +------------+----------+-------------+---------+--------------------------  ----+     Medical Decision Making   Tanner Rollins is a 71 y.o. male who presents for post op visit of left 1 stage basilic vein transposition on 01/28/2022 by Dr.Dickson  - His left arm incisions are well-healed.  He has palpable radial and brachial pulses. - Duplex study of the left basilic fistula matured, patent fistula.  The fistula does have an area of stenosis and reduced diameter in the mid upper arm.  This area has an elevated  velocity of 761.  This area of reduced diameter can  also be seen on exam. - There is swelling in the most distal portion of the fistula, likely due to the area of stenosis within the fistula.  There is also an engorged branch off the fistula that has pulsatile flow throughout. -The patient has had 2 successful dialysis sessions so far with the fistula.  However there is concern for the longevity of the fistula due to the engorged branch and noticeable area of stenosis.  -He will be scheduled for a left arm fistula branch ligation with intra-op fistulogram within the next few weeks on a nondialysis day. The patient is agreeable to another surgery to improve the flow throughout his fistula.    Vicente Serene PA-C [facility] Office: [phone]  Clinic MD: Trula Slade

## 2022-05-28 ENCOUNTER — Ambulatory Visit: Payer: Self-pay | Admitting: Cardiovascular Disease

## 2022-05-29 ENCOUNTER — Ambulatory Visit
Payer: No Typology Code available for payment source | Attending: Cardiovascular Disease | Admitting: Cardiovascular Disease

## 2022-06-07 ENCOUNTER — Other Ambulatory Visit: Payer: Self-pay

## 2022-06-07 ENCOUNTER — Encounter (HOSPITAL_COMMUNITY): Payer: Self-pay | Admitting: Vascular Surgery

## 2022-06-07 NOTE — Progress Notes (Signed)
Mr. Mccaughey denies chest pain or shortness of breath. Patient denies having any s/s of Covid in his household, also denies any known exposure to Covid.   Mr. Wilson is stopping Eliquis after today doses.  Mr. Frankl can not read medications to me , he is bring them on Monday.  I spoke the name of the medications that I have listed, he confirmed by name.  Mr. Minahan PCP is with [facility], Cardiologist is Dr. Adora Fridge and Nephrologist is with [facility].

## 2022-06-09 NOTE — Anesthesia Preprocedure Evaluation (Addendum)
Anesthesia Evaluation  Patient identified by MRN, date of birth, ID band Patient awake    Reviewed: Allergy & Precautions, NPO status , Patient's Chart, lab work & pertinent test results  Airway Mallampati: II  TM Distance: >3 FB Neck ROM: Full    Dental no notable dental hx.    Pulmonary former smoker   Pulmonary exam normal        Cardiovascular hypertension, Pt. on medications + dysrhythmias Atrial Fibrillation  Rhythm:Regular Rate:Normal     Neuro/Psych negative neurological ROS  negative psych ROS   GI/Hepatic negative GI ROS, Neg liver ROS,,,  Endo/Other  negative endocrine ROS    Renal/GU ESRF and DialysisRenal disease  negative genitourinary   Musculoskeletal negative musculoskeletal ROS (+)    Abdominal Normal abdominal exam  (+)   Peds  Hematology  (+) Blood dyscrasia, anemia   Anesthesia Other Findings   Reproductive/Obstetrics                             Anesthesia Physical Anesthesia Plan  ASA: 3  Anesthesia Plan: MAC and Regional   Post-op Pain Management: Regional block*   Induction: Intravenous  PONV Risk Score and Plan: 1 and Ondansetron, Dexamethasone, Propofol infusion, Treatment may vary due to age or medical condition and Midazolam  Airway Management Planned: Simple Face Mask, Natural Airway and Nasal Cannula  Additional Equipment: None  Intra-op Plan:   Post-operative Plan:   Informed Consent: I have reviewed the patients History and Physical, chart, labs and discussed the procedure including the risks, benefits and alternatives for the proposed anesthesia with the patient or authorized representative who has indicated his/her understanding and acceptance.     Dental advisory given  Plan Discussed with: CRNA  Anesthesia Plan Comments: (Lab Results      Component                Value               Date                      WBC                       5.7                 03/28/2022                HGB                      12.9 (L)            06/10/2022                HCT                      38.0 (L)            06/10/2022                MCV                      90.7                03/28/2022                PLT  152                 03/28/2022            Lab Results      Component                Value               Date                      NA                       138                 06/10/2022                K                        3.6                 06/10/2022                CO2                      29                  03/28/2022                GLUCOSE                  88                  06/10/2022                BUN                      23                  06/10/2022                CREATININE               9.30 (H)            06/10/2022                CALCIUM                  8.9                 03/28/2022                GFRNONAA                 20 (L)              03/28/2022           )        Anesthesia Quick Evaluation

## 2022-06-10 ENCOUNTER — Other Ambulatory Visit: Payer: Self-pay

## 2022-06-10 ENCOUNTER — Encounter (HOSPITAL_COMMUNITY)
Admission: RE | Disposition: A | Discharge status: Home / Self Care | Payer: Self-pay | Source: Home / Self Care | Attending: Vascular Surgery

## 2022-06-10 ENCOUNTER — Encounter (HOSPITAL_COMMUNITY): Payer: Self-pay | Admitting: Vascular Surgery

## 2022-06-10 ENCOUNTER — Ambulatory Visit (HOSPITAL_COMMUNITY)
Admission: RE | Admit: 2022-06-10 | Discharge: 2022-06-10 | Disposition: A | Discharge status: Home / Self Care | Payer: No Typology Code available for payment source | Attending: Vascular Surgery | Admitting: Vascular Surgery

## 2022-06-10 ENCOUNTER — Ambulatory Visit (HOSPITAL_COMMUNITY): Payer: No Typology Code available for payment source

## 2022-06-10 ENCOUNTER — Ambulatory Visit (HOSPITAL_BASED_OUTPATIENT_CLINIC_OR_DEPARTMENT_OTHER): Payer: No Typology Code available for payment source | Admitting: Anesthesiology

## 2022-06-10 ENCOUNTER — Ambulatory Visit (HOSPITAL_COMMUNITY): Payer: No Typology Code available for payment source | Admitting: Anesthesiology

## 2022-06-10 DIAGNOSIS — I480 Paroxysmal atrial fibrillation: Secondary | ICD-10-CM | POA: Insufficient documentation

## 2022-06-10 DIAGNOSIS — Z992 Dependence on renal dialysis: Secondary | ICD-10-CM | POA: Diagnosis not present

## 2022-06-10 DIAGNOSIS — D631 Anemia in chronic kidney disease: Secondary | ICD-10-CM

## 2022-06-10 DIAGNOSIS — Z87891 Personal history of nicotine dependence: Secondary | ICD-10-CM | POA: Insufficient documentation

## 2022-06-10 DIAGNOSIS — X58XXXA Exposure to other specified factors, initial encounter: Secondary | ICD-10-CM | POA: Diagnosis not present

## 2022-06-10 DIAGNOSIS — I120 Hypertensive chronic kidney disease with stage 5 chronic kidney disease or end stage renal disease: Secondary | ICD-10-CM

## 2022-06-10 DIAGNOSIS — T82858A Stenosis of vascular prosthetic devices, implants and grafts, initial encounter: Secondary | ICD-10-CM | POA: Insufficient documentation

## 2022-06-10 DIAGNOSIS — Z79899 Other long term (current) drug therapy: Secondary | ICD-10-CM | POA: Diagnosis not present

## 2022-06-10 DIAGNOSIS — N185 Chronic kidney disease, stage 5: Secondary | ICD-10-CM | POA: Diagnosis not present

## 2022-06-10 DIAGNOSIS — T82898A Other specified complication of vascular prosthetic devices, implants and grafts, initial encounter: Secondary | ICD-10-CM | POA: Diagnosis not present

## 2022-06-10 DIAGNOSIS — N186 End stage renal disease: Secondary | ICD-10-CM | POA: Diagnosis not present

## 2022-06-10 DIAGNOSIS — T82590A Other mechanical complication of surgically created arteriovenous fistula, initial encounter: Secondary | ICD-10-CM | POA: Diagnosis not present

## 2022-06-10 HISTORY — DX: Paroxysmal atrial fibrillation: I48.0

## 2022-06-10 HISTORY — PX: ANGIOPLASTY: SHX39

## 2022-06-10 HISTORY — PX: FISTULOGRAM: SHX5832

## 2022-06-10 LAB — POCT I-STAT, CHEM 8
BUN: 23 mg/dL (ref 8–23)
Calcium, Ion: 1.2 mmol/L (ref 1.15–1.40)
Chloride: 99 mmol/L (ref 98–111)
Creatinine, Ser: 9.3 mg/dL — ABNORMAL HIGH (ref 0.61–1.24)
Glucose, Bld: 88 mg/dL (ref 70–99)
HCT: 38 % — ABNORMAL LOW (ref 39.0–52.0)
Hemoglobin: 12.9 g/dL — ABNORMAL LOW (ref 13.0–17.0)
Potassium: 3.6 mmol/L (ref 3.5–5.1)
Sodium: 138 mmol/L (ref 135–145)
TCO2: 27 mmol/L (ref 22–32)

## 2022-06-10 SURGERY — ANGIOPLASTY
Anesthesia: Monitor Anesthesia Care | Site: Arm Upper | Laterality: Left

## 2022-06-10 MED ORDER — SODIUM CHLORIDE 0.9 % IV SOLN: INTRAVENOUS | Status: DC

## 2022-06-10 MED ORDER — STERILE WATER FOR IRRIGATION IR SOLN
Status: DC | PRN
  Administered 2022-06-10: 1000 mL

## 2022-06-10 MED ORDER — ACETAMINOPHEN 10 MG/ML IV SOLN: 1000.0000 mg | Freq: Once | INTRAVENOUS | Status: DC | PRN

## 2022-06-10 MED ORDER — HEPARIN 6000 UNIT IRRIGATION SOLUTION
Status: AC
  Filled 2022-06-10: qty 500

## 2022-06-10 MED ORDER — CHLORHEXIDINE GLUCONATE 4 % EX LIQD: 60.0000 mL | Freq: Once | CUTANEOUS | Status: DC

## 2022-06-10 MED ORDER — IODIXANOL 320 MG/ML IV SOLN
INTRAVENOUS | Status: DC | PRN
  Administered 2022-06-10: 40 mL via INTRA_ARTERIAL

## 2022-06-10 MED ORDER — LIDOCAINE 2% (20 MG/ML) 5 ML SYRINGE
INTRAMUSCULAR | Status: DC | PRN
  Administered 2022-06-10: 20 mg via INTRAVENOUS

## 2022-06-10 MED ORDER — CHLORHEXIDINE GLUCONATE 0.12 % MT SOLN
15.0000 mL | Freq: Once | OROMUCOSAL | Status: AC
  Administered 2022-06-10: 15 mL via OROMUCOSAL
  Filled 2022-06-10: qty 15

## 2022-06-10 MED ORDER — PROPOFOL 500 MG/50ML IV EMUL
INTRAVENOUS | Status: DC | PRN
  Administered 2022-06-10: 75 ug/kg/min via INTRAVENOUS

## 2022-06-10 MED ORDER — FENTANYL CITRATE (PF) 250 MCG/5ML IJ SOLN
INTRAMUSCULAR | Status: DC | PRN
  Administered 2022-06-10: 50 ug via INTRAVENOUS

## 2022-06-10 MED ORDER — 0.9 % SODIUM CHLORIDE (POUR BTL) OPTIME
TOPICAL | Status: DC | PRN
  Administered 2022-06-10: 1000 mL

## 2022-06-10 MED ORDER — ONDANSETRON HCL 4 MG/2ML IJ SOLN
INTRAMUSCULAR | Status: DC | PRN
  Administered 2022-06-10: 4 mg via INTRAVENOUS

## 2022-06-10 MED ORDER — CEFAZOLIN SODIUM-DEXTROSE 2-4 GM/100ML-% IV SOLN
2.0000 g | INTRAVENOUS | Status: AC
  Administered 2022-06-10: 2 g via INTRAVENOUS
  Filled 2022-06-10: qty 100

## 2022-06-10 MED ORDER — PROPOFOL 10 MG/ML IV BOLUS
INTRAVENOUS | Status: AC
  Filled 2022-06-10: qty 20

## 2022-06-10 MED ORDER — LIDOCAINE-EPINEPHRINE (PF) 1 %-1:200000 IJ SOLN
INTRAMUSCULAR | Status: AC
  Filled 2022-06-10: qty 30

## 2022-06-10 MED ORDER — HEPARIN 6000 UNIT IRRIGATION SOLUTION
Status: DC | PRN
  Administered 2022-06-10: 1

## 2022-06-10 MED ORDER — FENTANYL CITRATE (PF) 250 MCG/5ML IJ SOLN
INTRAMUSCULAR | Status: AC
  Filled 2022-06-10: qty 5

## 2022-06-10 MED ORDER — FENTANYL CITRATE (PF) 100 MCG/2ML IJ SOLN: 25.0000 ug | INTRAMUSCULAR | Status: DC | PRN

## 2022-06-10 MED ORDER — LIDOCAINE 2% (20 MG/ML) 5 ML SYRINGE
INTRAMUSCULAR | Status: AC
  Filled 2022-06-10: qty 5

## 2022-06-10 MED ORDER — DEXAMETHASONE SODIUM PHOSPHATE 10 MG/ML IJ SOLN
INTRAMUSCULAR | Status: DC | PRN
  Administered 2022-06-10: 4 mg via INTRAVENOUS

## 2022-06-10 MED ORDER — DEXAMETHASONE SODIUM PHOSPHATE 10 MG/ML IJ SOLN
INTRAMUSCULAR | Status: AC
  Filled 2022-06-10: qty 1

## 2022-06-10 MED ORDER — PHENYLEPHRINE HCL-NACL 20-0.9 MG/250ML-% IV SOLN
INTRAVENOUS | Status: DC | PRN
  Administered 2022-06-10: 10 ug/min via INTRAVENOUS

## 2022-06-10 MED ORDER — MEPIVACAINE HCL (PF) 2 % IJ SOLN
INTRAMUSCULAR | Status: DC | PRN
  Administered 2022-06-10: 20 mL

## 2022-06-10 MED ORDER — ONDANSETRON HCL 4 MG/2ML IJ SOLN
INTRAMUSCULAR | Status: AC
  Filled 2022-06-10: qty 2

## 2022-06-10 MED ORDER — ORAL CARE MOUTH RINSE: 15.0000 mL | Freq: Once | OROMUCOSAL | Status: AC

## 2022-06-10 SURGICAL SUPPLY — 44 items
ADH SKN CLS APL DERMABOND .7 (GAUZE/BANDAGES/DRESSINGS) ×2
ARMBAND PINK RESTRICT EXTREMIT (MISCELLANEOUS) ×2 IMPLANT
BAG COUNTER SPONGE SURGICOUNT (BAG) ×2 IMPLANT
BAG SPNG CNTER NS LX DISP (BAG) ×2
BALLN MUSTANG 6.0X40 75 (BALLOONS) ×2
BALLN MUSTANG 8.0X40 75 (BALLOONS) ×2
BALLN MUSTANG 8X20X75 (BALLOONS) ×2
BALLOON MUSTANG 6.0X40 75 (BALLOONS) ×1 IMPLANT
BALLOON MUSTANG 8.0X40 75 (BALLOONS) ×1 IMPLANT
BALLOON MUSTANG 8X20X75 (BALLOONS) ×1 IMPLANT
CANISTER SUCT 3000ML PPV (MISCELLANEOUS) ×2 IMPLANT
CLIP VESOCCLUDE MED 6/CT (CLIP) ×1 IMPLANT
CLIP VESOCCLUDE SM WIDE 6/CT (CLIP) ×1 IMPLANT
COVER DOME SNAP 22 D (MISCELLANEOUS) ×1 IMPLANT
DERMABOND ADVANCED .7 DNX12 (GAUZE/BANDAGES/DRESSINGS) ×2 IMPLANT
ELECT REM PT RETURN 9FT ADLT (ELECTROSURGICAL) ×2
ELECTRODE REM PT RTRN 9FT ADLT (ELECTROSURGICAL) ×2 IMPLANT
GLOVE BIO SURGEON STRL SZ7.5 (GLOVE) ×2 IMPLANT
GLOVE SURG UNDER LTX SZ8 (GLOVE) ×2 IMPLANT
GOWN STRL REUS W/ TWL LRG LVL3 (GOWN DISPOSABLE) ×6 IMPLANT
GOWN STRL REUS W/TWL LRG LVL3 (GOWN DISPOSABLE) ×6
KIT BASIN OR (CUSTOM PROCEDURE TRAY) ×2 IMPLANT
KIT ENCORE 26 ADVANTAGE (KITS) ×1 IMPLANT
KIT TURNOVER KIT B (KITS) ×2 IMPLANT
NS IRRIG 1000ML POUR BTL (IV SOLUTION) ×2 IMPLANT
PACK CV ACCESS (CUSTOM PROCEDURE TRAY) ×2 IMPLANT
PAD ARMBOARD 7.5X6 YLW CONV (MISCELLANEOUS) ×4 IMPLANT
SET MICROPUNCTURE 5F STIFF (MISCELLANEOUS) ×1 IMPLANT
SHEATH PINNACLE R/O II 6F 4CM (SHEATH) ×1 IMPLANT
SPONGE SURGIFOAM ABS GEL 100 (HEMOSTASIS) IMPLANT
STOPCOCK MORSE 400PSI 3WAY (MISCELLANEOUS) ×2 IMPLANT
SUT ETHILON 3 0 PS 1 (SUTURE) IMPLANT
SUT MNCRL AB 4-0 PS2 18 (SUTURE) ×2 IMPLANT
SUT PROLENE 6 0 BV (SUTURE) ×2 IMPLANT
SUT SILK 0 TIES 10X30 (SUTURE) ×2 IMPLANT
SUT VIC AB 3-0 SH 27 (SUTURE) ×2
SUT VIC AB 3-0 SH 27X BRD (SUTURE) ×2 IMPLANT
SYR 10ML LL (SYRINGE) ×2 IMPLANT
SYR 20ML LL LF (SYRINGE) ×1 IMPLANT
TOWEL GREEN STERILE (TOWEL DISPOSABLE) ×2 IMPLANT
TUBING CIL FLEX 10 FLL-RA (TUBING) ×1 IMPLANT
UNDERPAD 30X36 HEAVY ABSORB (UNDERPADS AND DIAPERS) ×2 IMPLANT
WATER STERILE IRR 1000ML POUR (IV SOLUTION) ×2 IMPLANT
WIRE BENTSON .035X145CM (WIRE) ×1 IMPLANT

## 2022-06-10 NOTE — Anesthesia Procedure Notes (Signed)
Procedure Name: MAC Date/Time: 06/10/2022 7:37 AM  Performed by: Kyung Rudd, CRNAPre-anesthesia Checklist: Patient identified, Emergency Drugs available, Suction available and Patient being monitored Patient Re-evaluated:Patient Re-evaluated prior to induction Oxygen Delivery Method: Simple face mask Induction Type: IV induction Placement Confirmation: positive ETCO2 Dental Injury: Teeth and Oropharynx as per pre-operative assessment

## 2022-06-10 NOTE — Anesthesia Postprocedure Evaluation (Signed)
Anesthesia Post Note  Patient: Tanner Rollins  Procedure(s) Performed: Balloon VENOPLASTY (Left: Arm Upper) FISTULOGRAM (Left: Arm Upper)     Patient location during evaluation: PACU Anesthesia Type: Regional and MAC Level of consciousness: awake and alert Pain management: pain level controlled Vital Signs Assessment: post-procedure vital signs reviewed and stable Respiratory status: spontaneous breathing, nonlabored ventilation, respiratory function stable and patient connected to nasal cannula oxygen Cardiovascular status: stable and blood pressure returned to baseline Postop Assessment: no apparent nausea or vomiting Anesthetic complications: no   No notable events documented.  Last Vitals:  Vitals:   06/10/22 0900 06/10/22 0915  BP: (!) 169/90 (!) 168/98  Pulse: 69 68  Resp: 11 11  Temp: 36.4 C 36.7 C  SpO2: 99% 96%    Last Pain:  Vitals:   06/10/22 0900  TempSrc:   PainSc: Asleep                 Belenda Cruise P Emmogene Simson

## 2022-06-10 NOTE — Transfer of Care (Signed)
Immediate Anesthesia Transfer of Care Note  Patient: Tanner Rollins  Procedure(s) Performed: Balloon VENOPLASTY (Left: Arm Upper) FISTULOGRAM (Left: Arm Upper)  Patient Location: PACU  Anesthesia Type:MAC  Level of Consciousness: drowsy  Airway & Oxygen Therapy: Patient Spontanous Breathing  Post-op Assessment: Report given to RN  Post vital signs: Reviewed and stable  Last Vitals:  Vitals Value Taken Time  BP 169/90 06/10/22 0900  Temp    Pulse 68 06/10/22 0901  Resp 16 06/10/22 0901  SpO2 97 % 06/10/22 0901  Vitals shown include unvalidated device data.  Last Pain:  Vitals:   06/10/22 0900  TempSrc:   PainSc: Asleep      Patients Stated Pain Goal: 0 (36/62/94 7654)  Complications: No notable events documented.

## 2022-06-10 NOTE — Interval H&P Note (Signed)
History and Physical Interval Note:  06/10/2022 7:13 AM  Tanner Rollins  has presented today for surgery, with the diagnosis of ESRD.  The various methods of treatment have been discussed with the patient and family. After consideration of risks, benefits and other options for treatment, the patient has consented to  Procedure(s): LEFT BASILIC BRANCH LIGATION (Left) FISTULOGRAM (Left) as a surgical intervention.  The patient's history has been reviewed, patient examined, no change in status, stable for surgery.  I have reviewed the patient's chart and labs.  Questions were answered to the patient's satisfaction.     Deitra Mayo

## 2022-06-10 NOTE — Anesthesia Procedure Notes (Signed)
Anesthesia Regional Block: Supraclavicular block   Pre-Anesthetic Checklist: , timeout performed,  Correct Patient, Correct Site, Correct Laterality,  Correct Procedure, Correct Position, site marked,  Risks and benefits discussed,  Surgical consent,  Pre-op evaluation,  At surgeon's request and post-op pain management  Laterality: Left  Prep: Dura Prep       Needles:  Injection technique: Single-shot  Needle Type: Echogenic Stimulator Needle     Needle Length: 5cm  Needle Gauge: 20     Additional Needles:   Procedures:,,,, ultrasound used (permanent image in chart),,    Narrative:  Start time: 06/10/2022 7:13 AM End time: 06/10/2022 7:15 AM Injection made incrementally with aspirations every 5 mL.  Performed by: Personally  Anesthesiologist: Darral Dash, DO  Additional Notes: Patient identified. Risks/Benefits/Options discussed with patient including but not limited to bleeding, infection, nerve damage, failed block, incomplete pain control. Patient expressed understanding and wished to proceed. All questions were answered. Sterile technique was used throughout the entire procedure. Please see nursing notes for vital signs. Aspirated in 5cc intervals with injection for negative confirmation. Patient was given instructions on fall risk and not to get out of bed. All questions and concerns addressed with instructions to call with any issues or inadequate analgesia.

## 2022-06-10 NOTE — Op Note (Signed)
    NAME: Tanner Rollins    MRN: [id number] DOB: 26-Mar-1951    DATE OF OPERATION: 06/10/2022  PREOP DIAGNOSIS:    Poorly functioning left upper arm fistula  POSTOP DIAGNOSIS:    Same  PROCEDURE:    Ultrasound-guided access to left basilic vein transposition Fistulogram Venoplasty of left basilic vein  SURGEON: Judeth Cornfield. Scot Dock, MD  ASSIST: Karoline Caldwell, PA  ANESTHESIA: Block  EBL: Minimal  INDICATIONS:    Tanner Rollins is a 71 y.o. male who had a basilic vein transposition on 01/28/2022.  On a follow-up duplex he was noted to have a stenosis in the central portion of his fistula.  He presents for fistulogram and possible revision.   FINDINGS:   There is an area of 60 to 70% stenosis in the midportion of the fistula which was addressed with venoplasty. No central venous stenosis. No problem at the arterial anastomosis. No other problems identified in the fistula.  TECHNIQUE:   The patient was taken to the operating room.  The left arm was prepped and draped in usual sterile fashion.  Under ultrasound guidance the proximal fistula was cannulated with a micropuncture needle and a micropuncture sheath introduced over a wire.  Fistulogram was then obtained to evaluate the central veins all the way down to the anastomosis.  There was an area of 60 to 70% stenosis in the midportion of the fistula.  I elected to address this with angioplasty.  The 4 French sheath was exchanged for a short 6 [place] sheath.  The patient was heparinized.  I initially selected a 6 x 4 balloon which was inflated to 12 atm for 1 minute.  Of note, with the balloon inflated I did do a retrograde shot which showed no problems at the arterial anastomosis.  Follow-up films showed some improvement but still significant stenosis.  I went back with an 8 x 4 and then subsequently an 8 x 2 balloon and was able to get a good result with minimal residual stenosis of less than 20%.  The fistula had a  significant improved thrill at that time.   A 4-0 Monocryl was placed around the cannulation site.  There was good hemostasis.  Given the complexity of the case a first assistant was necessary in order to expedient the procedure and safely perform the technical aspects of the operation.  Deitra Mayo, MD, FACS [facility]  DATE OF DICTATION:   06/10/2022

## 2022-06-10 NOTE — Progress Notes (Signed)
Orthopedic Tech Progress Note Patient Details:  Tanner Rollins October 02, 1950 [id number]  PACU RN called requesting an ARM SLING  Ortho Devices Type of Ortho Device: Arm sling Ortho Device/Splint Location: LUE Ortho Device/Splint Interventions: Ordered   Post Interventions Patient Tolerated: Well Instructions Provided: Care of [place] 06/10/2022, 10:02 AM

## 2022-06-11 ENCOUNTER — Encounter (HOSPITAL_COMMUNITY): Payer: Self-pay | Admitting: Vascular Surgery

## 2022-06-12 ENCOUNTER — Telehealth: Payer: Self-pay | Admitting: Cardiovascular Disease

## 2022-06-12 NOTE — Telephone Encounter (Signed)
Spoke with Tye Maryland from [place] regarding order for clonidine. Per chart, Dr. Gwenlyn Found did not see the pt nor did any of his colleagues. Per Care Everywhere pt was seen at the [place] on 8/18 and when medication was prescribed. Tye Maryland confirms this information and verbalizes understanding. She will reach out to the [place] to get order signed.

## 2022-06-12 NOTE — Telephone Encounter (Signed)
Tye Maryland is calling stating there is a 08/18 medication order for clonidine they have been trying to get signed off on. They have faxed this to the [facility] office on 09/07, 09/28, & 10/10. She is resending this today to the attention of [place]. They are needing this signed and sent back. Please advise.

## 2022-06-12 NOTE — Telephone Encounter (Signed)
Spoke with Tye Maryland from [place]. She asked for the form she faxed regarding a clonidine order to be signed wrong provider and faxed back to [place] at [phone].

## 2022-07-24 IMAGING — DX DG CHEST 1V PORT
1 series · 1 of 1 positions shown · non-contrast
Comparison: Chest x-ray 11/17/2011. CT abdomen and pelvis
03/11/2021.

CLINICAL DATA: Chest pain.

EXAM:
PORTABLE CHEST 1 VIEW

[chest]
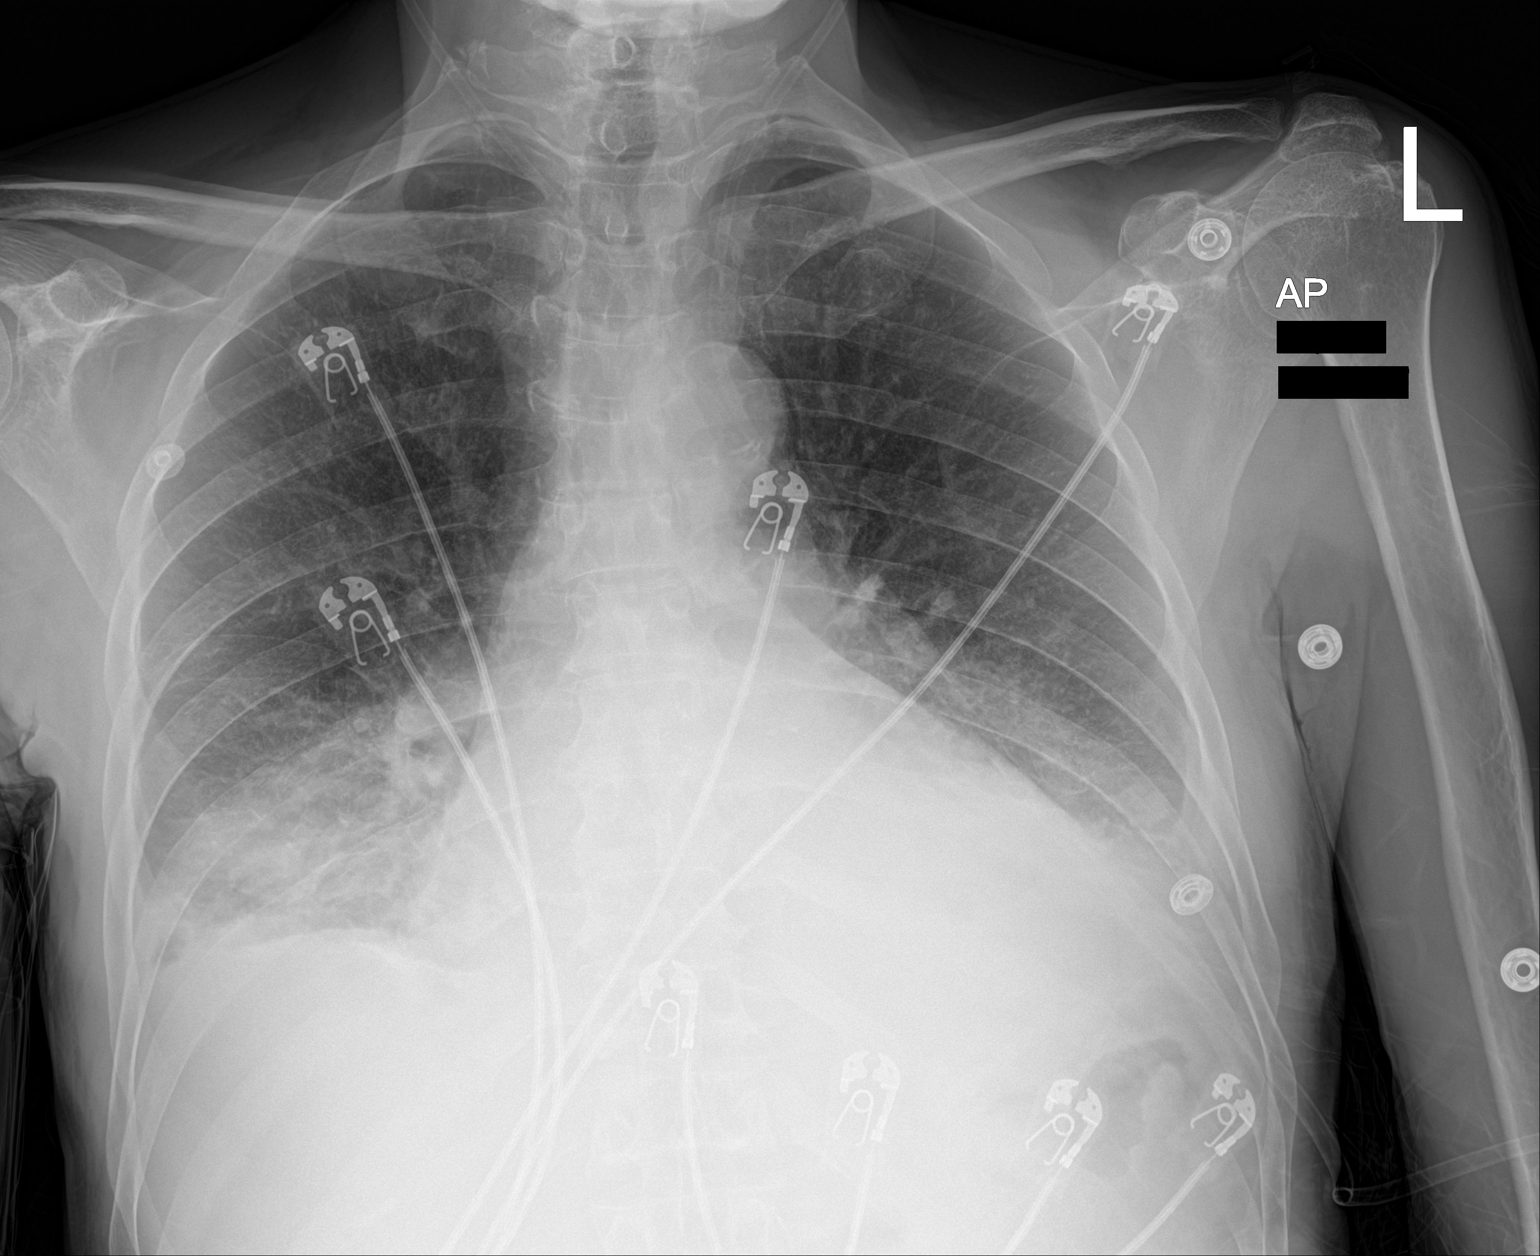

[1 of 1 positions shown; findings below may reference images not displayed]

FINDINGS: There is patchy airspace disease in the bilateral lower lobes. There
is likely a small left pleural effusion. Cardiac silhouette is
enlarged, a new finding. No evidence for pneumothorax. No acute
fractures.
IMPRESSION: 1. New enlarged cardiac silhouette may represent cardiomegaly and or
pericardial effusion.
2. Bilateral lower lobe airspace disease worrisome for infection.
3. Small left pleural effusion.

## 2022-07-24 IMAGING — US US RENAL
1 series · 14 of 25 positions shown · non-contrast
Comparison: CT abdomen and pelvis 02/09/2021. Renal ultrasound
03/12/2021.

CLINICAL DATA: Acute on chronic renal failure.

EXAM:
RENAL / URINARY TRACT ULTRASOUND COMPLETE

[Series 1: us renal · 14 of 37 slices shown]
[im 1/37]
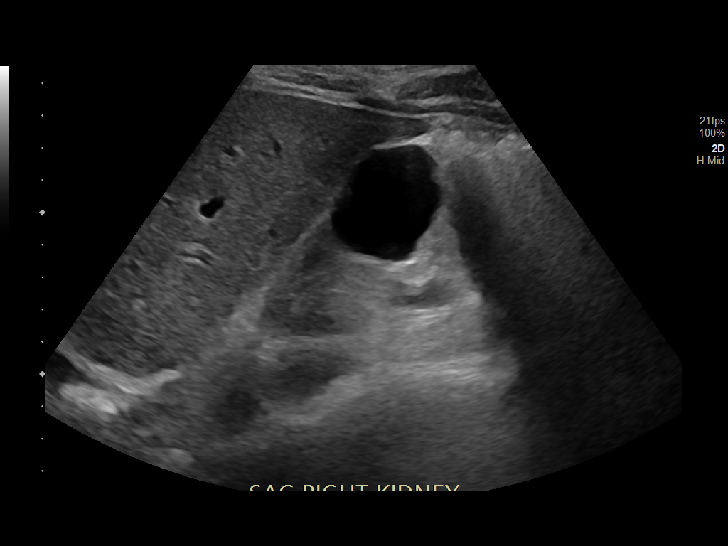
[im 4/37]
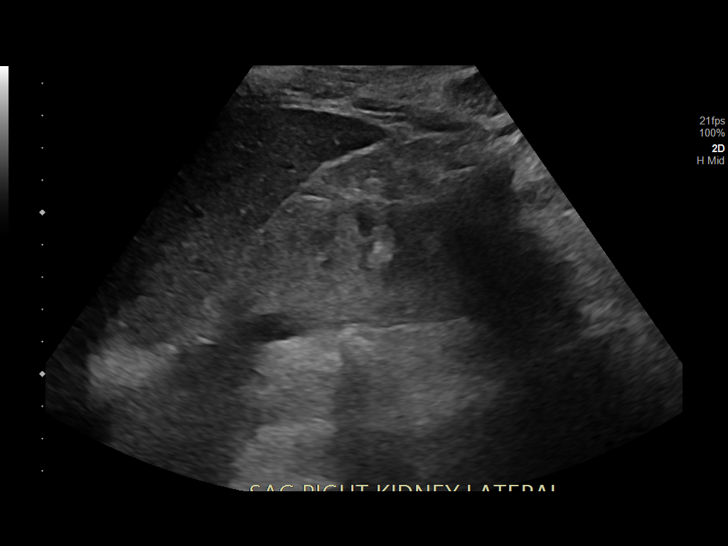
[im 7/37]
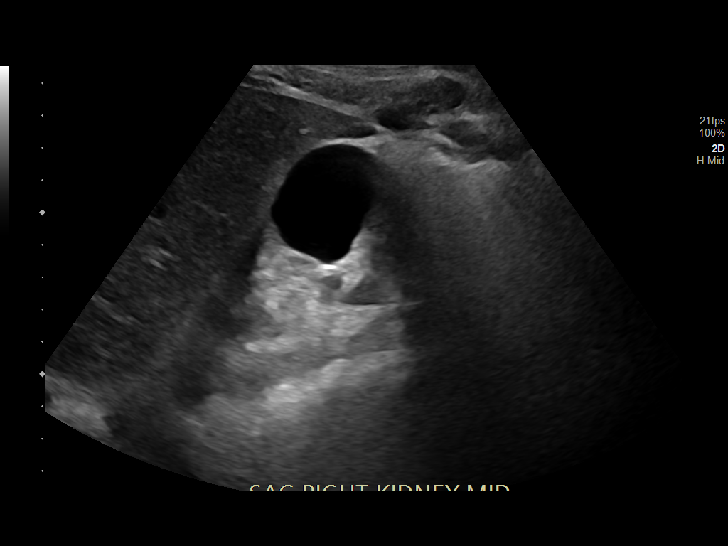
[im 10/37]
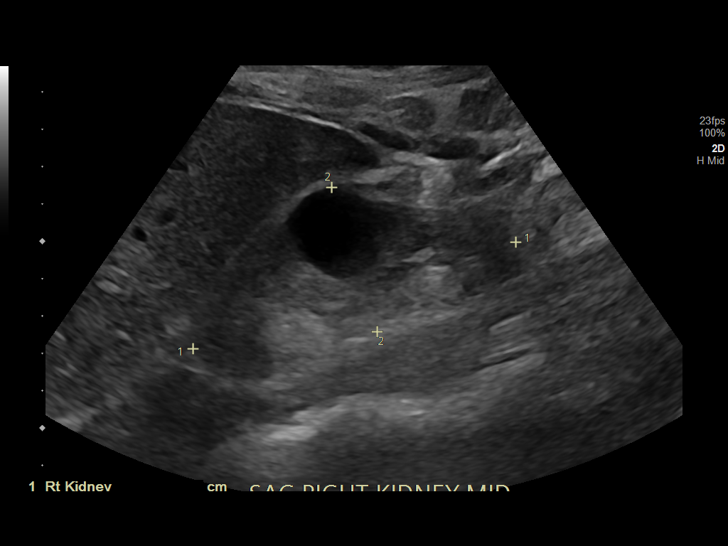
[im 13/37]
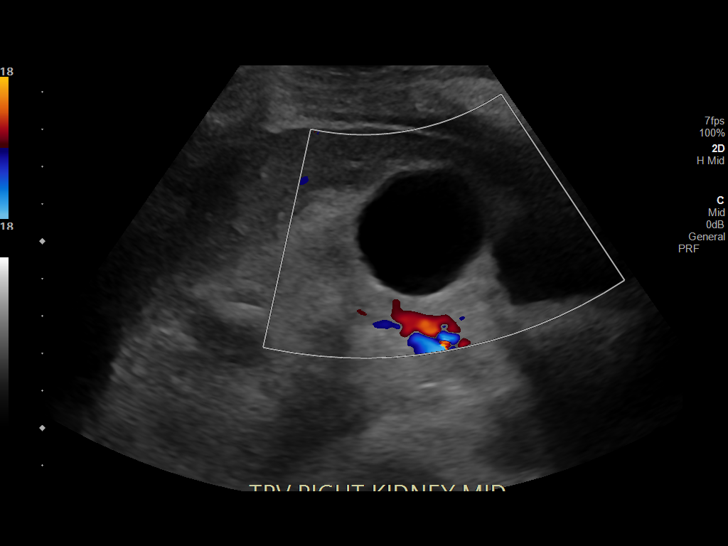
[im 14/37]
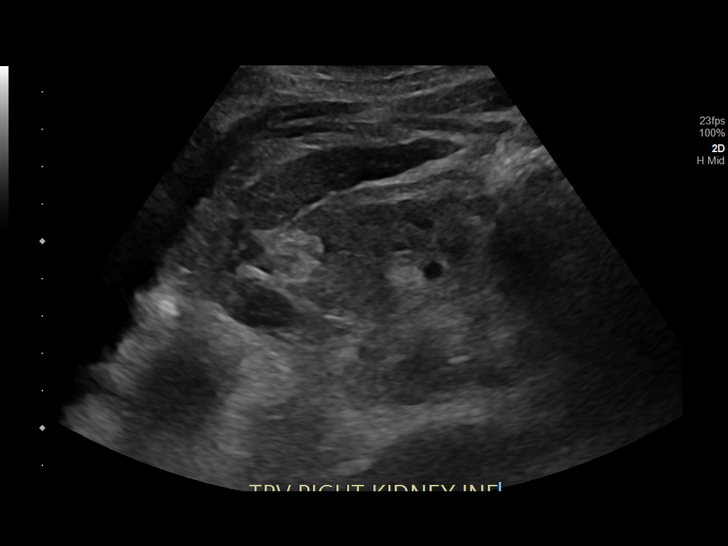
[im 17/37]
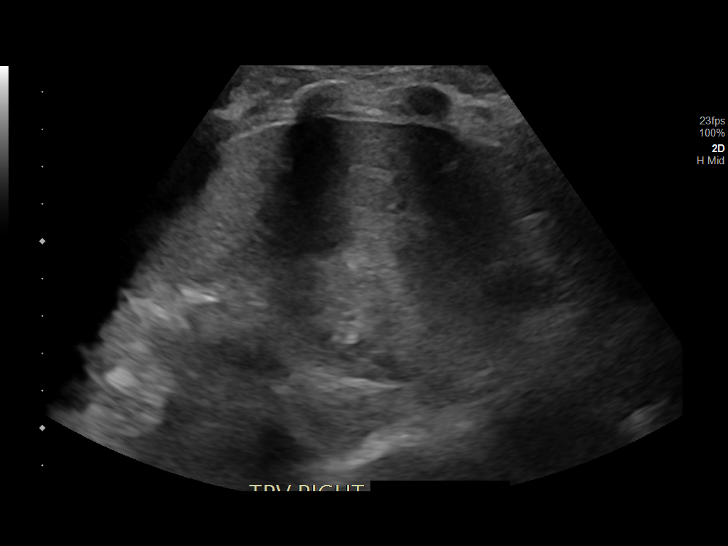
[im 20/37]
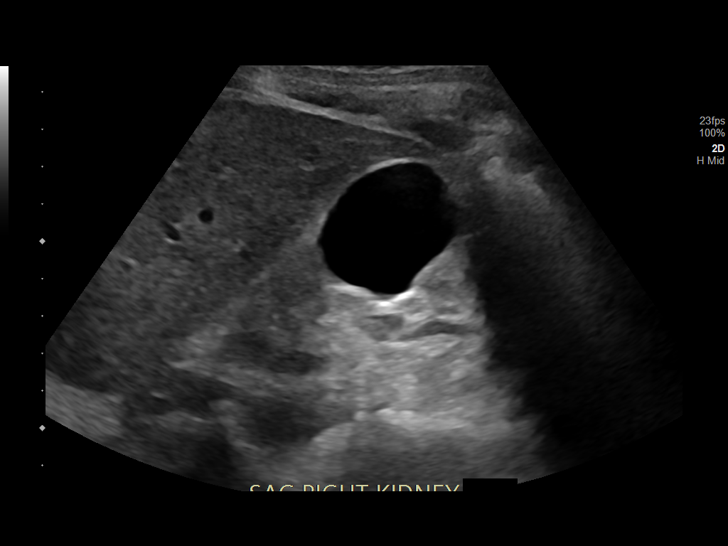
[im 23/37]
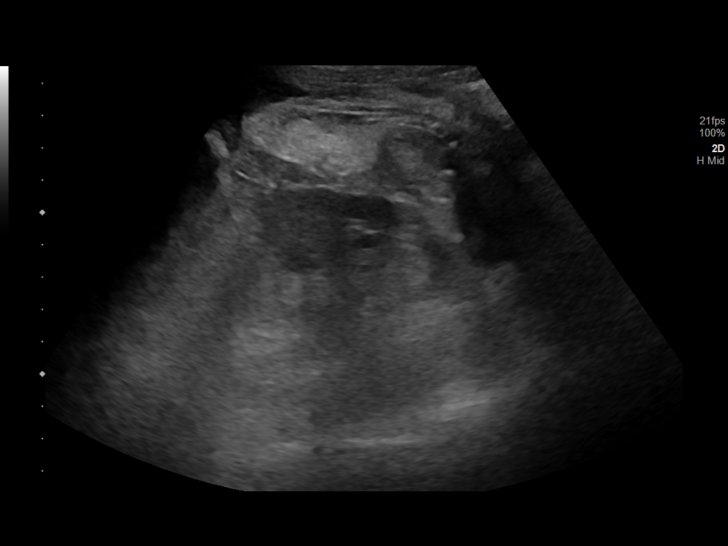
[im 25/37]
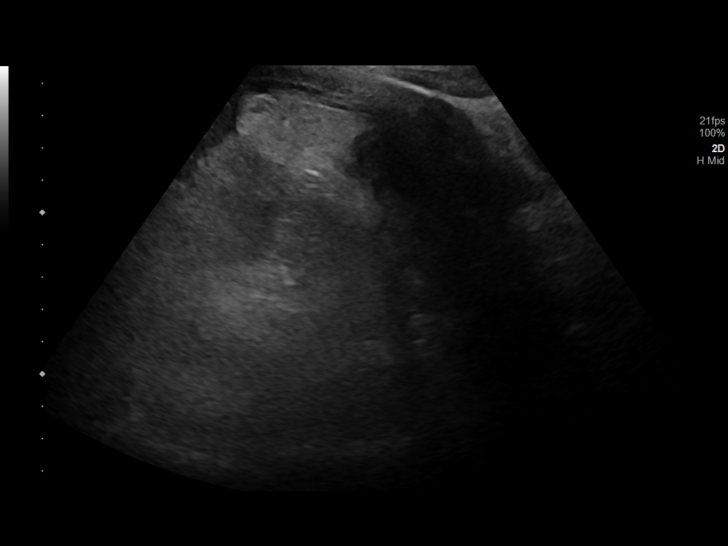
[im 28/37]
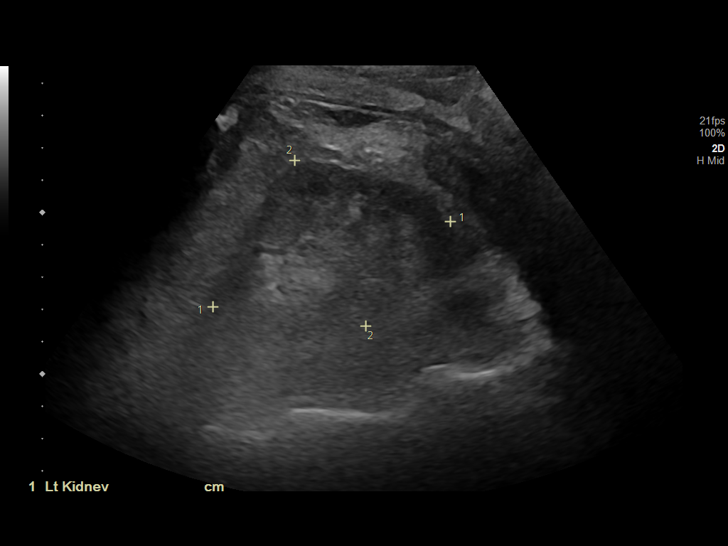
[im 31/37]
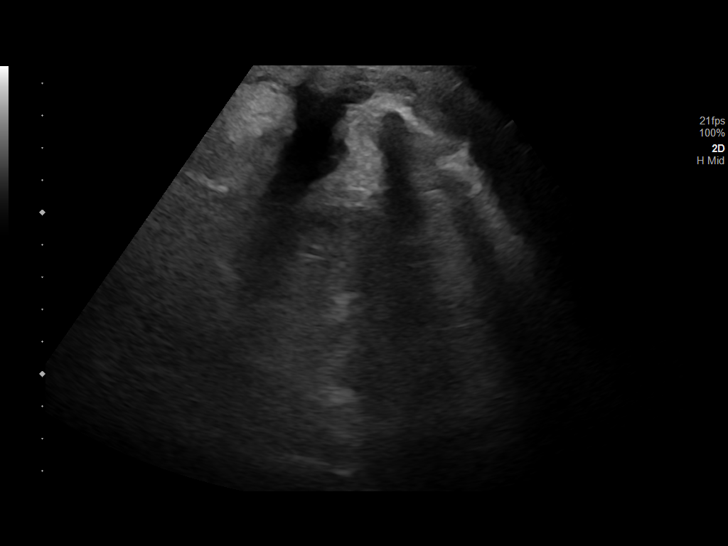
[im 34/37]
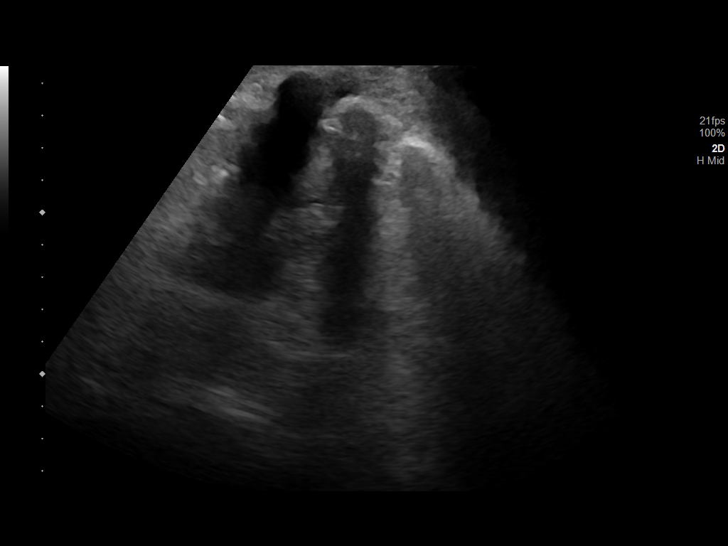
[im 37/37]
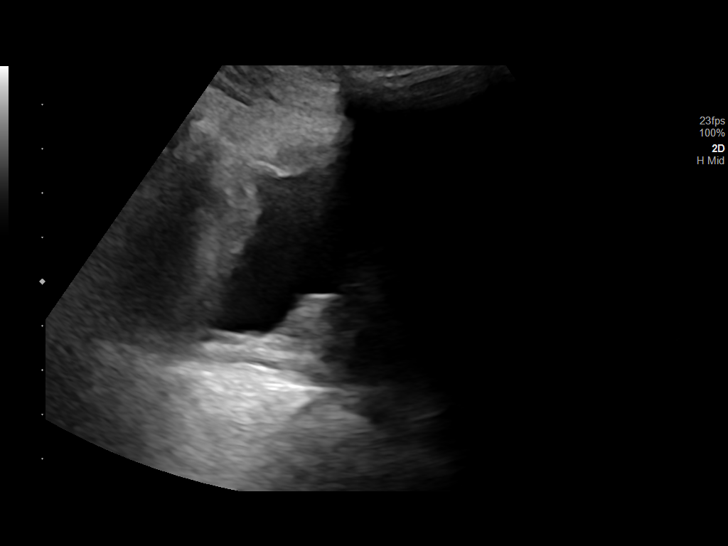

[14 of 25 positions shown; findings below may reference images not displayed]

FINDINGS: Right Kidney:

Renal measurements: 9.1 x 4.1 x 3.8 cm = volume: 73 mL. Echogenicity
is increased. No hydronephrosis. There is a 3.7 x 2.8 x 3.8 cm cyst
in the superior pole the right kidney which has mildly increased in
size.

Left Kidney:

Renal measurements: 7.8 x 5.6 x 4.0 cm = volume: 92 mL. Echogenicity
is increased. No mass or hydronephrosis visualized.

Bladder:

Appears normal for degree of bladder distention.

Other:

Right pleural effusion present.
IMPRESSION: 1. Echogenic kidneys likely related to medical renal disease,
similar to prior study.
2. No hydronephrosis.
3. Right renal cyst has mildly increased in size.
4. Right pleural effusion.

## 2022-07-25 IMAGING — US IR FLUORO GUIDE CV LINE*R*
1 series · 1 of 1 positions shown · non-contrast
Comparison: None Available.

CLINICAL DATA: Renal failure, needs durable venous access for
planned hemodialysis

EXAM:
TUNNELED HEMODIALYSIS CATHETER PLACEMENT WITH ULTRASOUND AND
FLUOROSCOPIC GUIDANCE
TECHNIQUE: The procedure, risks, benefits, and alternatives were explained to
the patient. Questions regarding the procedure were encouraged and
answered. The patient understands and consents to the procedure.

[Series 1: ir (id) (id)/(id)/(id) ir · 1 of 1 slices shown]
[im 1/1]
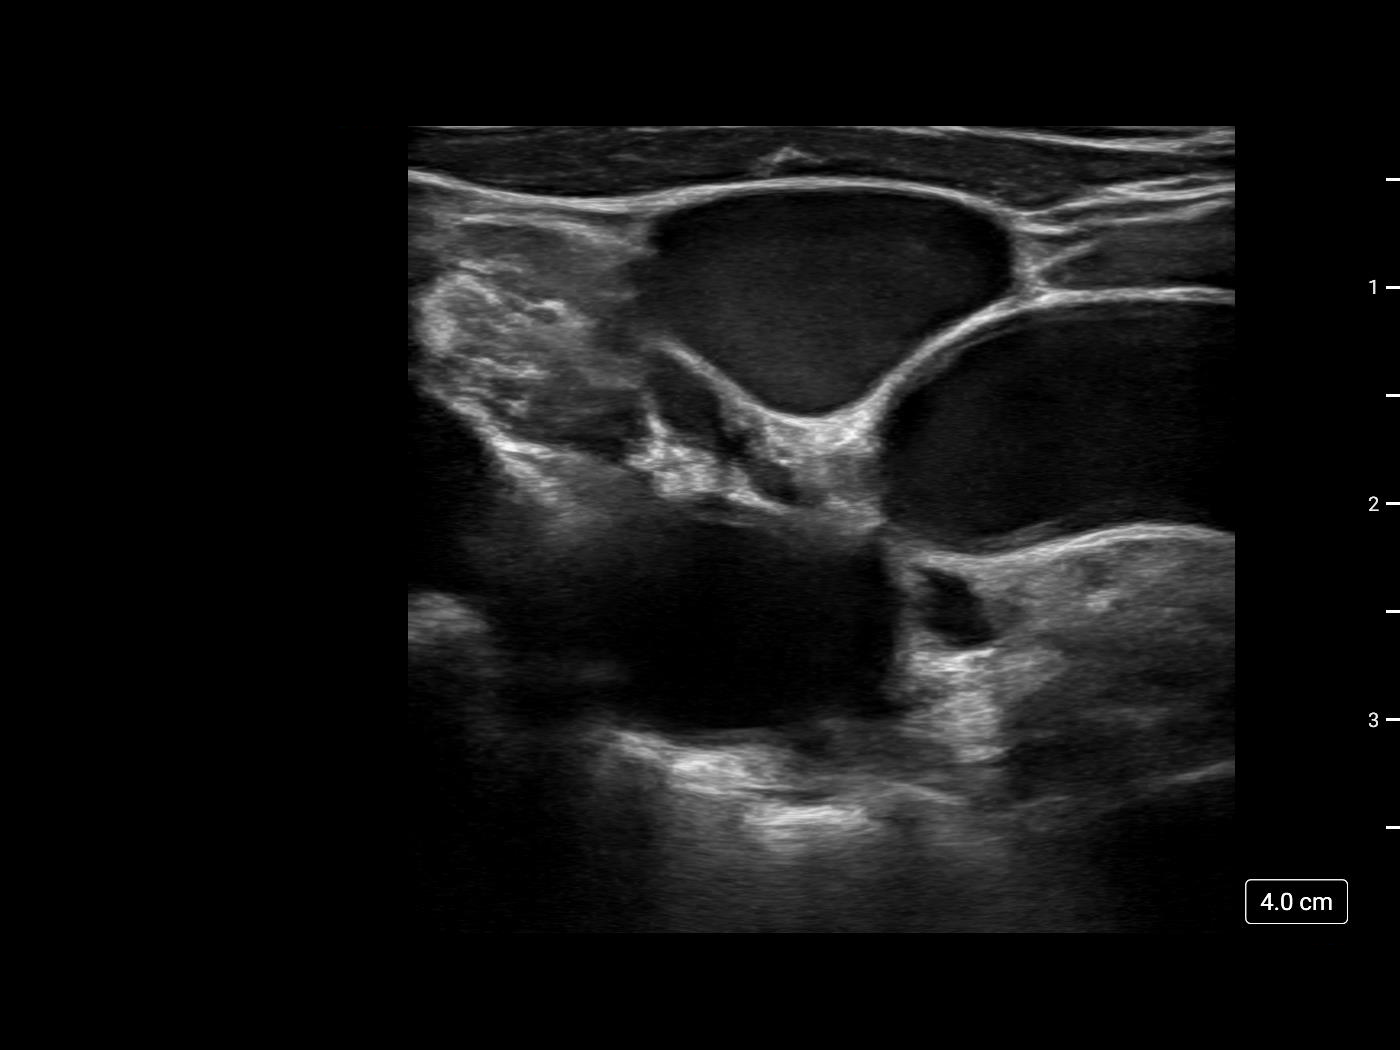

[1 of 1 positions shown; findings below may reference images not displayed]

As antibiotic prophylaxis, cefazolin 2 g was ordered pre-procedure
and administered intravenously within one hour of incision.

Patency of the right IJ vein was confirmed with ultrasound with
image documentation. An appropriate skin site was determined. Region
was prepped using maximum barrier technique including cap and mask,
sterile gown, sterile gloves, large sterile sheet, and Chlorhexidine
as cutaneous antisepsis. The region was infiltrated locally with 1%
lidocaine.

Under real-time ultrasound guidance, the right IJ vein was accessed
with a 21 gauge micropuncture needle; the needle tip within the vein
was confirmed with ultrasound image documentation. Needle exchanged
over the 018 guidewire for transitional dilator, which allowed
advancement of a Benson wire into the IVC. Over this, an MPA
catheter was advanced. A Palindrome 23 hemodialysis catheter was
tunneled from the right anterior chest wall approach to the right IJ
dermatotomy site. The MPA catheter was exchanged over an Amplatz
wire for serial vascular dilators which allow placement of a
peel-away sheath, through which the catheter was advanced under
intermittent fluoroscopy, positioned with its tips in the proximal
and midright atrium. Spot chest radiograph confirms good catheter
position. No pneumothorax. Catheter was flushed and primed per
protocol. Catheter secured externally with O Prolene sutures. The
right IJ dermatotomy site was closed with Dermabond.

COMPLICATIONS:
COMPLICATIONS
None immediate
IMPRESSION: 1. Technically successful placement of tunneled right IJ
hemodialysis catheter with ultrasound and fluoroscopic guidance.
Ready for routine use.

ACCESS:
Remains approachable for percutaneous intervention as needed.

## 2022-07-26 IMAGING — DX DG CHEST 1V PORT
1 series · 1 of 1 positions shown · non-contrast
Comparison: Radiograph January 23, 2022

CLINICAL DATA: Hypoxemia.

EXAM:
PORTABLE CHEST 1 VIEW

[chest]
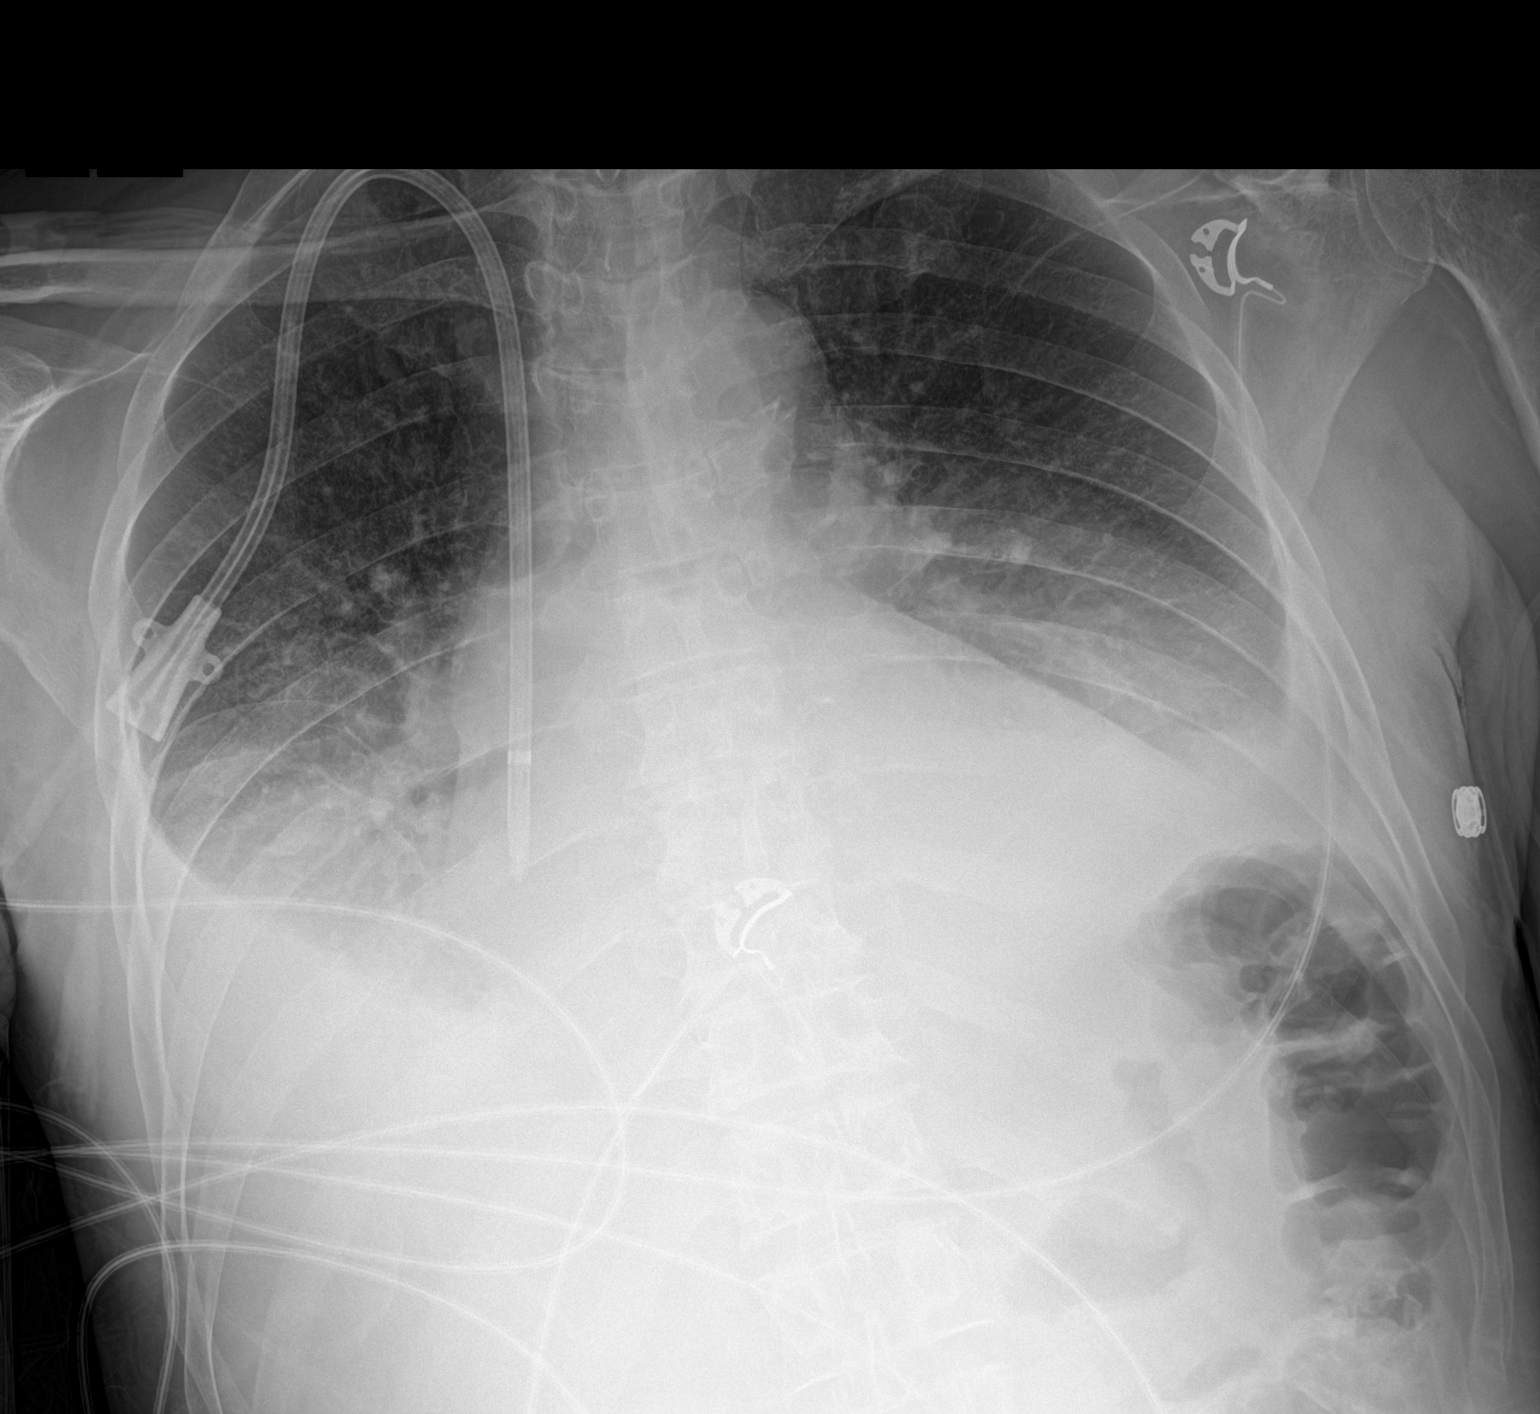

[1 of 1 positions shown; findings below may reference images not displayed]

FINDINGS: Interval placement of a dual-lumen right approach central venous
catheter with tip overlying the right atrium. No visible
pneumothorax.

The heart size and mediastinal contours are partially obscured but
appear unchanged.

Increase in now moderate right pleural effusion and stable small
left pleural effusion. Opacities in the bilateral lung bases along
the pleural effusions are similar prior may reflect atelectasis or
consolidation. No acute osseous abnormality.
IMPRESSION: Interval placement of a dual lumen right-sided central venous
catheter without visible pneumothorax.

Increase in a now moderate right pleural effusion with a stable
small left pleural effusion and unchanged adjacent bilateral
airspace opacities reflecting infiltrate or atelectasis.

## 2022-07-30 ENCOUNTER — Emergency Department (HOSPITAL_COMMUNITY): Payer: No Typology Code available for payment source

## 2022-07-30 ENCOUNTER — Encounter (HOSPITAL_COMMUNITY): Payer: Self-pay

## 2022-07-30 ENCOUNTER — Other Ambulatory Visit: Payer: Self-pay

## 2022-07-30 ENCOUNTER — Inpatient Hospital Stay (HOSPITAL_COMMUNITY)
Admission: EM | Admit: 2022-07-30 | Discharge: 2022-08-03 | DRG: 100 | Disposition: A | Discharge status: Home Health | Payer: No Typology Code available for payment source | Attending: Internal Medicine | Admitting: Internal Medicine

## 2022-07-30 DIAGNOSIS — I120 Hypertensive chronic kidney disease with stage 5 chronic kidney disease or end stage renal disease: Secondary | ICD-10-CM | POA: Diagnosis present

## 2022-07-30 DIAGNOSIS — R569 Unspecified convulsions: Principal | ICD-10-CM

## 2022-07-30 DIAGNOSIS — I4891 Unspecified atrial fibrillation: Secondary | ICD-10-CM | POA: Diagnosis present

## 2022-07-30 DIAGNOSIS — Z6820 Body mass index (BMI) 20.0-20.9, adult: Secondary | ICD-10-CM

## 2022-07-30 DIAGNOSIS — G40901 Epilepsy, unspecified, not intractable, with status epilepticus: Principal | ICD-10-CM | POA: Diagnosis present

## 2022-07-30 DIAGNOSIS — G8384 Todd's paralysis (postepileptic): Secondary | ICD-10-CM | POA: Diagnosis present

## 2022-07-30 DIAGNOSIS — Z87891 Personal history of nicotine dependence: Secondary | ICD-10-CM

## 2022-07-30 DIAGNOSIS — M898X9 Other specified disorders of bone, unspecified site: Secondary | ICD-10-CM | POA: Diagnosis present

## 2022-07-30 DIAGNOSIS — I10 Essential (primary) hypertension: Secondary | ICD-10-CM | POA: Diagnosis present

## 2022-07-30 DIAGNOSIS — E43 Unspecified severe protein-calorie malnutrition: Secondary | ICD-10-CM | POA: Diagnosis present

## 2022-07-30 DIAGNOSIS — Z992 Dependence on renal dialysis: Secondary | ICD-10-CM

## 2022-07-30 DIAGNOSIS — Z1152 Encounter for screening for COVID-19: Secondary | ICD-10-CM

## 2022-07-30 DIAGNOSIS — Z7901 Long term (current) use of anticoagulants: Secondary | ICD-10-CM

## 2022-07-30 DIAGNOSIS — E875 Hyperkalemia: Secondary | ICD-10-CM | POA: Diagnosis not present

## 2022-07-30 DIAGNOSIS — N186 End stage renal disease: Secondary | ICD-10-CM

## 2022-07-30 DIAGNOSIS — I480 Paroxysmal atrial fibrillation: Secondary | ICD-10-CM | POA: Diagnosis present

## 2022-07-30 DIAGNOSIS — G40909 Epilepsy, unspecified, not intractable, without status epilepticus: Secondary | ICD-10-CM | POA: Diagnosis present

## 2022-07-30 DIAGNOSIS — I160 Hypertensive urgency: Secondary | ICD-10-CM | POA: Diagnosis present

## 2022-07-30 DIAGNOSIS — D631 Anemia in chronic kidney disease: Secondary | ICD-10-CM | POA: Diagnosis present

## 2022-07-30 DIAGNOSIS — G8194 Hemiplegia, unspecified affecting left nondominant side: Secondary | ICD-10-CM | POA: Diagnosis present

## 2022-07-30 DIAGNOSIS — Z79899 Other long term (current) drug therapy: Secondary | ICD-10-CM

## 2022-07-30 MED ORDER — SODIUM CHLORIDE 0.9 % IV SOLN
4000.0000 mg | Freq: Once | INTRAVENOUS | Status: AC
  Administered 2022-07-31: 4000 mg via INTRAVENOUS
  Filled 2022-07-30: qty 40

## 2022-07-30 MED ORDER — LORAZEPAM 2 MG/ML IJ SOLN: 4.0000 mg | INTRAMUSCULAR | Status: DC | PRN

## 2022-07-30 MED ORDER — IOHEXOL 350 MG/ML SOLN
100.0000 mL | Freq: Once | INTRAVENOUS | Status: AC | PRN
  Administered 2022-07-30: 100 mL via INTRAVENOUS

## 2022-07-30 NOTE — Consult Note (Incomplete)
Neurology Consultation  Reason for Consult: seizure, left-sided weakness Referring Physician: Dr. Doren Custard  CC: Seizure  History is obtained from: Patient, sister  HPI: Tanner Rollins is a 71 y.o. male past medical history of ESRD on hemodialysis, paroxysmal atrial fibrillation on Eliquis, hypertension presented via EMS from home with complaints of seizure activity.  ER was under the impression that he is coming from an assisted living but he lives at [facility], which is an apartment complex. In either case, the sister reports that he was feeling off balance when she saw him after dialysis at around 4 PM.  He might have been normal state of health somewhere around 12 PM when he went for dialysis.  She reports that his left arm was jerking quite a lot today.  He sometimes has stiffness and jerking of his left arm.  This was way outside the norm EMS was called and they reported that he was reportedly seizing for upwards of an hour.  He received 5 mg of Versed and appeared to be neglecting left side on arrival to the ER.  ED doc called me.  I examined the patient.  He had leftward gaze preference and was not moving his left side.  I recommended we activate a code stroke.  Noncontrast head CT was unremarkable.  CT angiography head and neck unremarkable for an LVO.   LKW: Noon today IV thrombolysis given?: no, on Eliquis and outside the window, likely seizure and not a stroke Premorbid modified Rankin scale (mRS): 2   ROS: Full ROS was performed and is negative except as noted in the HPI. *** Unable to obtain due to altered mental status.   Past Medical History:  Diagnosis Date  . ESRD on hemodialysis ([place])    [place]  . Hypertension   . PAF (paroxysmal atrial fibrillation) ([facility])    ***  No family history on file. ***  Social History:   reports that he quit smoking about 32 years ago. His smoking use included cigarettes. He has never used smokeless tobacco. He reports that he  does not drink alcohol and does not use drugs. *** Medications  Current Facility-Administered Medications:  .  levETIRAcetam (KEPPRA) 4,000 mg in sodium chloride 0.9 % 250 mL IVPB, 4,000 mg, Intravenous, Once, Godfrey Pick, MD .  LORazepam (ATIVAN) injection 4 mg, 4 mg, Intravenous, Q5 Min x 2 PRN, Godfrey Pick, MD  Current Outpatient Medications:  .  amiodarone (PACERONE) 200 MG tablet, Take 1 tablet (200 mg total) by mouth daily., Disp: 30 tablet, Rfl: 0 .  apixaban (ELIQUIS) 5 MG TABS tablet, Take 1 tablet (5 mg total) by mouth 2 (two) times daily., Disp: 60 tablet, Rfl:  .  atorvastatin (LIPITOR) 40 MG tablet, Take 20 mg by mouth at bedtime., Disp: , Rfl:  .  calcium acetate (PHOSLO) 667 MG capsule, Take 2 capsules (1,334 mg total) by mouth 3 (three) times daily with meals., Disp: , Rfl:  .  cloNIDine (CATAPRES) 0.1 MG tablet, Take 0.1 mg by mouth 2 (two) times daily., Disp: , Rfl:  .  diltiazem (CARDIZEM CD) 180 MG 24 hr capsule, Take 1 capsule (180 mg total) by mouth daily. (Patient taking differently: Take 180 mg by mouth every evening.), Disp: , Rfl:  .  docusate sodium (COLACE) 100 MG capsule, Take 1 capsule (100 mg total) by mouth 2 (two) times daily., Disp: 10 capsule, Rfl: 0 .  Multiple Vitamin (MULTIVITAMIN WITH MINERALS) TABS tablet, Take 1 tablet by mouth daily., Disp: ,  Rfl:  .  Nutritional Supplements (FEEDING SUPPLEMENT, NEPRO CARB STEADY,) LIQD, Take 237 mLs by mouth every evening., Disp: , Rfl:  .  polyethylene glycol (MIRALAX / GLYCOLAX) 17 g packet, Take 17 g by mouth daily as needed for moderate constipation., Disp: 14 each, Rfl: 0 ***  Exam: Current vital signs: There were no vitals taken for this visit. Vital signs in last 24 hours:    GENERAL: Awake, alert in NAD HEENT: - Normocephalic and atraumatic, dry mm, no LN++, no Thyromegally LUNGS - Clear to auscultation bilaterally with no wheezes CV - S1S2 RRR, no m/r/g, equal pulses bilaterally. ABDOMEN - Soft,  nontender, nondistended with normoactive BS Ext: warm, well perfused, intact peripheral pulses, __ edema  NEURO:  Mental Status: AA&Ox3 *** Language: speech is _____.  Naming, repetition, fluency, and comprehension intact.*** Cranial Nerves: PERRL*** EOMI***, visual fields full***, no facial asymmetry***,*** facial sensation intact, hearing intact, tongue/uvula/soft palate midline, normal*** sternocleidomastoid and trapezius muscle strength. No evidence of tongue atrophy or fibrillations*** Motor: *** Tone: is normal and bulk is normal*** Sensation- Intact to light touch bilaterally*** Coordination: FTN intact bilaterally, no ataxia in BLE.*** Gait- deferred***  NIHSS*** 1a Level of Conscious.: *** 1b LOC Questions: *** 1c LOC Commands: *** 2 Best Gaze: *** 3 Visual: *** 4 Facial Palsy: *** 5a Motor Arm - left: *** 5b Motor Arm - Right: *** 6a Motor Leg - Left: *** 6b Motor Leg - Right: *** 7 Limb Ataxia: *** 8 Sensory: *** 9 Best Language: *** 10 Dysarthria: *** 11 Extinct. and Inatten.: *** TOTAL: ***   Labs I have reviewed labs in epic and the results pertinent to this consultation are: *** CBC    Component Value Date/Time   WBC 5.7 03/28/2022 1640   RBC 2.58 (L) 03/28/2022 1640   HGB 12.9 (L) 06/10/2022 0554   HCT 38.0 (L) 06/10/2022 0554   PLT 152 03/28/2022 1640   MCV 90.7 03/28/2022 1640   MCH 27.9 03/28/2022 1640   MCHC 30.8 03/28/2022 1640   RDW 19.8 (H) 03/28/2022 1640   LYMPHSABS 1.6 02/02/2022 1712   MONOABS 0.9 02/02/2022 1712   EOSABS 0.0 02/02/2022 1712   BASOSABS 0.0 02/02/2022 1712    CMP     Component Value Date/Time   NA 138 06/10/2022 0554   K 3.6 06/10/2022 0554   CL 99 06/10/2022 0554   CO2 29 03/28/2022 1640   GLUCOSE 88 06/10/2022 0554   BUN 23 06/10/2022 0554   CREATININE 9.30 (H) 06/10/2022 0554   CALCIUM 8.9 03/28/2022 1640   PROT 6.0 (L) 02/12/2022 1047   ALBUMIN 1.7 (L) 02/13/2022 0348   AST 117 (H) 02/02/2022 1825   ALT  33 02/02/2022 1825   ALKPHOS 89 02/02/2022 1825   BILITOT 1.1 02/02/2022 1825   GFRNONAA 20 (L) 03/28/2022 1640    Lipid Panel     Component Value Date/Time   CHOL 191 03/11/2021 1022   TRIG 103 03/11/2021 1022   HDL 45 03/11/2021 1022   CHOLHDL 4.2 03/11/2021 1022   VLDL 21 03/11/2021 1022   LDLCALC 125 (H) 03/11/2021 1022     Imaging I have reviewed the images obtained:  CT-head***  MRI examination of the brain***  Assessment: ***  {Cerebrovascular Diagnosis:21265}  Impression:***  Recommendations:***   -- Amie Portland, MD Neurologist [facility] Pager: [phone]

## 2022-07-30 NOTE — Consult Note (Signed)
Neurology Consultation  Reason for Consult: seizure, left-sided weakness Referring Physician: Dr. Doren Custard  CC: Seizure  History is obtained from: Patient, sister  HPI: Tanner Rollins is a 71 y.o. male past medical history of ESRD on hemodialysis, paroxysmal atrial fibrillation on Eliquis, hypertension presented via EMS from home with complaints of seizure activity.  ER was under the impression that he is coming from an assisted living but he lives at [facility], which is an apartment complex. In either case, the sister reports that he was feeling off balance when she saw him after dialysis at around 4 PM.  He might have been normal state of health somewhere around 12 PM when he went for dialysis.  She reports that his left arm was jerking quite a lot today.  He sometimes has stiffness and jerking of his left arm.  This was way outside the norm EMS was called and they reported that he was reportedly seizing for upwards of an hour.  He received 5 mg of Versed and appeared to be neglecting left side on arrival to the ER.  ED doc called me.  I examined the patient.  He had leftward gaze preference and was not moving his left side.  I recommended we activate a code stroke.  Noncontrast head CT was unremarkable.  CT angiography head and neck unremarkable for an LVO.   LKW: Noon today IV thrombolysis given?: no, on Eliquis and outside the window, likely seizure and not a stroke Premorbid modified Rankin scale (mRS): 2   ROS: Full ROS was performed and is negative except as noted in the HPI.   Past Medical History:  Diagnosis Date   ESRD on hemodialysis ([place])    [place]   Hypertension    PAF (paroxysmal atrial fibrillation) ([place])      History reviewed. No pertinent family history.   Social History:   reports that he quit smoking about 32 years ago. His smoking use included cigarettes. He has never used smokeless tobacco. He reports that he does not drink alcohol and does not use  drugs.  Medications  Current Facility-Administered Medications:    levETIRAcetam (KEPPRA) 4,000 mg in sodium chloride 0.9 % 250 mL IVPB, 4,000 mg, Intravenous, Once, Godfrey Pick, MD   LORazepam (ATIVAN) injection 4 mg, 4 mg, Intravenous, Q5 Min x 2 PRN, Godfrey Pick, MD  Current Outpatient Medications:    amiodarone (PACERONE) 200 MG tablet, Take 1 tablet (200 mg total) by mouth daily., Disp: 30 tablet, Rfl: 0   apixaban (ELIQUIS) 5 MG TABS tablet, Take 1 tablet (5 mg total) by mouth 2 (two) times daily., Disp: 60 tablet, Rfl:    atorvastatin (LIPITOR) 40 MG tablet, Take 20 mg by mouth at bedtime., Disp: , Rfl:    calcium acetate (PHOSLO) 667 MG capsule, Take 2 capsules (1,334 mg total) by mouth 3 (three) times daily with meals., Disp: , Rfl:    cloNIDine (CATAPRES) 0.1 MG tablet, Take 0.1 mg by mouth 2 (two) times daily., Disp: , Rfl:    diltiazem (CARDIZEM CD) 180 MG 24 hr capsule, Take 1 capsule (180 mg total) by mouth daily. (Patient taking differently: Take 180 mg by mouth every evening.), Disp: , Rfl:    docusate sodium (COLACE) 100 MG capsule, Take 1 capsule (100 mg total) by mouth 2 (two) times daily., Disp: 10 capsule, Rfl: 0   Multiple Vitamin (MULTIVITAMIN WITH MINERALS) TABS tablet, Take 1 tablet by mouth daily., Disp: , Rfl:    Nutritional Supplements (FEEDING  SUPPLEMENT, NEPRO CARB STEADY,) LIQD, Take 237 mLs by mouth every evening., Disp: , Rfl:    polyethylene glycol (MIRALAX / GLYCOLAX) 17 g packet, Take 17 g by mouth daily as needed for moderate constipation., Disp: 14 each, Rfl: 0   Exam: Current vital signs: BP (!) 188/98   Pulse 68   Temp 97.6 F (36.4 C) (Oral)   Resp 15   Ht 5\' 11"  (1.803 m)   Wt 67.1 kg   SpO2 100%   BMI 20.64 kg/m  Vital signs in last 24 hours: Temp:  [97.6 F (36.4 C)] 97.6 F (36.4 C) (12/12 2337) Pulse Rate:  [68-80] 68 (12/13 0000) Resp:  [11-18] 15 (12/13 0000) BP: (188-198)/(95-102) 188/98 (12/13 0000) SpO2:  [100 %] 100 % (12/13  0000) Weight:  [67.1 kg] 67.1 kg (12/12 2338)  General: Drowsy, in no acute distress HEENT: Normocephalic atraumatic Lungs: Clear Cardiovascular: Regular rate rhythm Abdomen nondistended nontender Neurological exam He is drowsy, awakens to voice.  Follows simple commands.  Mildly dysarthric. Able to tell me his name.  Unable to tell me date or age correctly. Cranial nerves: Pupils are equal round reactive to light, has a leftward gaze preference but is able to look to the right.  Does not blink to threat from either side.  Face appears symmetric. Motor examination with increased tone in the left upper extremity that he keeps flexed at the elbow and resists passive movement.  Right upper extremity appears full strength.  Right lower extremity is antigravity.  Left lower extremity is antigravity but appears weaker than the right. Sensation: Intact to noxious stimulation NIHSS 1a Level of Conscious.: 1 1b LOC Questions: 2 1c LOC Commands: 0 2 Best Gaze: 1 3 Visual: 0 4 Facial Palsy: 0 5a Motor Arm - left: 3 5b Motor Arm - Right: 0 6a Motor Leg - Left: 2 6b Motor Leg - Right: 1 7 Limb Ataxia: 0 8 Sensory: 0 9 Best Language: 0 10 Dysarthria: 1 11 Extinct. and Inatten.: 0 TOTAL: 11   Labs I have reviewed labs in epic and the results pertinent to this consultation are:  CBC    Component Value Date/Time   WBC 5.6 07/30/2022 2356   RBC 4.27 07/30/2022 2356   HGB 13.9 07/31/2022 0002   HCT 41.0 07/31/2022 0002   PLT 146 (L) 07/30/2022 2356   MCV 90.2 07/30/2022 2356   MCH 30.0 07/30/2022 2356   MCHC 33.2 07/30/2022 2356   RDW 17.6 (H) 07/30/2022 2356   LYMPHSABS 0.5 (L) 07/30/2022 2356   MONOABS 0.3 07/30/2022 2356   EOSABS 0.0 07/30/2022 2356   BASOSABS 0.0 07/30/2022 2356    CMP     Component Value Date/Time   NA 134 (L) 07/31/2022 0002   K 4.8 07/31/2022 0002   CL 98 07/31/2022 0002   CO2 29 03/28/2022 1640   GLUCOSE 88 07/31/2022 0002   BUN 23 07/31/2022 0002    CREATININE 10.40 (H) 07/31/2022 0002   CALCIUM 8.9 03/28/2022 1640   PROT 6.0 (L) 02/12/2022 1047   ALBUMIN 1.7 (L) 02/13/2022 0348   AST 117 (H) 02/02/2022 1825   ALT 33 02/02/2022 1825   ALKPHOS 89 02/02/2022 1825   BILITOT 1.1 02/02/2022 1825   GFRNONAA 20 (L) 03/28/2022 1640    Imaging I have reviewed the images obtained:  CT-head done aspects 10. CT angiography head and neck and CT perfusion study: No emergent LVO.  CT perfusion with 4 cc penumbra and no core very distal  in the right hemisphere.  Assessment:  71 year old man with history of ESRD, paroxysmal A-fib on Eliquis, presenting for evaluation of seizure activity that lasted about 1 hour prior to subsiding with IM Versed given by EMS.  At baseline he is independent and lives in an apartment by himself. Outside the window as well as being on Eliquis precludes any IV thrombolysis. No emergent LVO. Unclear as to what might of precipitated the seizures.  He seems like he is postictal.  His gaze to the left and left-sided weakness does not corroborate with a stroke but rather a seizure and Todd's paralysis.  Impression: New onset seizure, status epilepticus Rule out stroke-cortical stroke leading to seizure?   Recommendations: MRI brain stat After the MRI is completed, hook up to LTM EEG Load with Keppra-1 g IV x 1 Start Keppra 500 twice daily Maintain seizure precautions No leukocytosis.  No evidence of CNS infection based on labs or clinical exam. Plan discussed with Dr. Waverly Ferrari Neurology to continue to follow  -- Amie Portland, MD Neurologist [facility] Pager: [phone]  [place] Performed by: Amie Portland, MD Total critical care time: 65 minutes Critical care time was exclusive of separately billable procedures and treating other patients and/or supervising APPs/Residents/Students Critical care was necessary to treat or prevent imminent or life-threatening  deterioration. This patient is critically ill and at significant risk for neurological worsening and/or death and care requires constant monitoring. Critical care was time spent personally by me on the following activities: development of treatment plan with patient and/or surrogate as well as nursing, discussions with consultants, evaluation of patient's response to treatment, examination of patient, obtaining history from patient or surrogate, ordering and performing treatments and interventions, ordering and review of laboratory studies, ordering and review of radiographic studies, pulse oximetry, re-evaluation of patient's condition, participation in multidisciplinary rounds and medical decision making of high complexity in the care of this patient.

## 2022-07-30 NOTE — ED Provider Notes (Signed)
[facility] EMERGENCY DEPARTMENT Provider Note   CSN: [id number] Arrival date & time:        History {Add pertinent medical, surgical, social history, OB history to HPI:1} No chief complaint on file.   Tanner Rollins is a 71 y.o. male.  HPI Patient presents for seizures.  Medical history includes***.  He arrives via EMS from skilled nursing facility.  Nursing facility staff stated that he had been having on and off seizures since yesterday.  Today, EMS was called.  When they arrived on scene, he was actively seizing.  They described as a left hemibody convulsive movements and unresponsiveness.  Nursing staff stated that he had been having continuous seizure activity for the hour prior to EMS arriving.  He was given 5 mg of intramuscular Versed.  He had cessation of convulsive movements.  Blood sugar was in the range of 150.  Vital signs, including SpO2 on room air was normal.  Patient remained unresponsive during transit.    Home Medications Prior to Admission medications   Medication Sig Start Date End Date Taking? Authorizing Provider  amiodarone (PACERONE) 200 MG tablet Take 1 tablet (200 mg total) by mouth daily. 02/23/22 06/10/22  Donne Hazel, MD  apixaban (ELIQUIS) 5 MG TABS tablet Take 1 tablet (5 mg total) by mouth 2 (two) times daily. 01/31/22   Shelly Coss, MD  atorvastatin (LIPITOR) 40 MG tablet Take 20 mg by mouth at bedtime.    [provider]  calcium acetate (PHOSLO) 667 MG capsule Take 2 capsules (1,334 mg total) by mouth 3 (three) times daily with meals. 01/31/22   Shelly Coss, MD  cloNIDine (CATAPRES) 0.1 MG tablet Take 0.1 mg by mouth 2 (two) times daily.    [provider]  diltiazem (CARDIZEM CD) 180 MG 24 hr capsule Take 1 capsule (180 mg total) by mouth daily. Patient taking differently: Take 180 mg by mouth every evening. 02/01/22   Shelly Coss, MD  docusate sodium (COLACE) 100 MG capsule Take 1 capsule (100 mg  total) by mouth 2 (two) times daily. 01/31/22   Shelly Coss, MD  Multiple Vitamin (MULTIVITAMIN WITH MINERALS) TABS tablet Take 1 tablet by mouth daily.    [provider]  Nutritional Supplements (FEEDING SUPPLEMENT, NEPRO CARB STEADY,) LIQD Take 237 mLs by mouth every evening.    [provider]  polyethylene glycol (MIRALAX / GLYCOLAX) 17 g packet Take 17 g by mouth daily as needed for moderate constipation. 01/31/22   Shelly Coss, MD      Allergies    Patient has no known allergies.    Review of Systems   Review of Systems  Physical Exam Updated Vital Signs There were no vitals taken for this visit. Physical Exam  ED Results / Procedures / Treatments   Labs (all labs ordered are listed, but only abnormal results are displayed) Labs Reviewed - No data to display  EKG None  Radiology No results found.  Procedures Procedures  {Document cardiac monitor, telemetry assessment procedure when appropriate:1}  Medications Ordered in ED Medications - No data to display  ED Course/ Medical Decision Making/ A&P                           Medical Decision Making  ***  {Document critical care time when appropriate:1} {Document review of labs and clinical decision tools ie heart score, Chads2Vasc2 etc:1}  {Document your independent review of radiology images, and any outside  records:1} {Document your discussion with family members, caretakers, and with consultants:1} {Document social determinants of health affecting pt's care:1} {Document your decision making why or why not admission, treatments were needed:1} Final Clinical Impression(s) / ED Diagnoses Final diagnoses:  None    Rx / DC Orders ED Discharge Orders     None

## 2022-07-30 NOTE — ED Triage Notes (Signed)
Patient arrives via EMS for seizures. Per EMS, patient arrives from [facility] for seizure like activity since yesterday. When EMS arrived on scene tonight, staff stated that patient had been having seizure like activity for an hour. EMS administered 5mg  of Versed IM. When patient arrives to this ED, patient appears to be neglecting L side and not able to move L arm or leg. ED provider to bedside and taken to CT scan.

## 2022-07-31 ENCOUNTER — Inpatient Hospital Stay (HOSPITAL_COMMUNITY): Payer: No Typology Code available for payment source

## 2022-07-31 ENCOUNTER — Encounter (HOSPITAL_COMMUNITY): Payer: Self-pay | Admitting: Internal Medicine

## 2022-07-31 ENCOUNTER — Emergency Department (HOSPITAL_COMMUNITY): Payer: No Typology Code available for payment source

## 2022-07-31 DIAGNOSIS — Z6820 Body mass index (BMI) 20.0-20.9, adult: Secondary | ICD-10-CM | POA: Diagnosis not present

## 2022-07-31 DIAGNOSIS — I120 Hypertensive chronic kidney disease with stage 5 chronic kidney disease or end stage renal disease: Secondary | ICD-10-CM | POA: Diagnosis present

## 2022-07-31 DIAGNOSIS — Z79899 Other long term (current) drug therapy: Secondary | ICD-10-CM | POA: Diagnosis not present

## 2022-07-31 DIAGNOSIS — N186 End stage renal disease: Secondary | ICD-10-CM

## 2022-07-31 DIAGNOSIS — G40909 Epilepsy, unspecified, not intractable, without status epilepticus: Secondary | ICD-10-CM | POA: Diagnosis present

## 2022-07-31 DIAGNOSIS — Z7901 Long term (current) use of anticoagulants: Secondary | ICD-10-CM | POA: Diagnosis not present

## 2022-07-31 DIAGNOSIS — I10 Essential (primary) hypertension: Secondary | ICD-10-CM | POA: Diagnosis not present

## 2022-07-31 DIAGNOSIS — I480 Paroxysmal atrial fibrillation: Secondary | ICD-10-CM

## 2022-07-31 DIAGNOSIS — R569 Unspecified convulsions: Secondary | ICD-10-CM | POA: Diagnosis not present

## 2022-07-31 DIAGNOSIS — Z992 Dependence on renal dialysis: Secondary | ICD-10-CM

## 2022-07-31 DIAGNOSIS — I160 Hypertensive urgency: Secondary | ICD-10-CM | POA: Diagnosis present

## 2022-07-31 DIAGNOSIS — G8194 Hemiplegia, unspecified affecting left nondominant side: Secondary | ICD-10-CM | POA: Diagnosis present

## 2022-07-31 DIAGNOSIS — Z1152 Encounter for screening for COVID-19: Secondary | ICD-10-CM | POA: Diagnosis not present

## 2022-07-31 DIAGNOSIS — G40901 Epilepsy, unspecified, not intractable, with status epilepticus: Secondary | ICD-10-CM | POA: Diagnosis present

## 2022-07-31 DIAGNOSIS — M898X9 Other specified disorders of bone, unspecified site: Secondary | ICD-10-CM | POA: Diagnosis present

## 2022-07-31 DIAGNOSIS — Z87891 Personal history of nicotine dependence: Secondary | ICD-10-CM | POA: Diagnosis not present

## 2022-07-31 DIAGNOSIS — E43 Unspecified severe protein-calorie malnutrition: Secondary | ICD-10-CM | POA: Diagnosis present

## 2022-07-31 DIAGNOSIS — D631 Anemia in chronic kidney disease: Secondary | ICD-10-CM | POA: Diagnosis present

## 2022-07-31 DIAGNOSIS — E875 Hyperkalemia: Secondary | ICD-10-CM | POA: Diagnosis not present

## 2022-07-31 DIAGNOSIS — G8384 Todd's paralysis (postepileptic): Secondary | ICD-10-CM | POA: Diagnosis present

## 2022-07-31 LAB — CBC WITH DIFFERENTIAL/PLATELET
Abs Immature Granulocytes: 0.06 10*3/uL (ref 0.00–0.07)
Basophils Absolute: 0 10*3/uL (ref 0.0–0.1)
Basophils Relative: 1 %
Eosinophils Absolute: 0 10*3/uL (ref 0.0–0.5)
Eosinophils Relative: 0 %
HCT: 38.5 % — ABNORMAL LOW (ref 39.0–52.0)
Hemoglobin: 12.8 g/dL — ABNORMAL LOW (ref 13.0–17.0)
Immature Granulocytes: 1 %
Lymphocytes Relative: 10 %
Lymphs Abs: 0.5 10*3/uL — ABNORMAL LOW (ref 0.7–4.0)
MCH: 30 pg (ref 26.0–34.0)
MCHC: 33.2 g/dL (ref 30.0–36.0)
MCV: 90.2 fL (ref 80.0–100.0)
Monocytes Absolute: 0.3 10*3/uL (ref 0.1–1.0)
Monocytes Relative: 5 %
Neutro Abs: 4.7 10*3/uL (ref 1.7–7.7)
Neutrophils Relative %: 83 %
Platelets: 146 10*3/uL — ABNORMAL LOW (ref 150–400)
RBC: 4.27 MIL/uL (ref 4.22–5.81)
RDW: 17.6 % — ABNORMAL HIGH (ref 11.5–15.5)
WBC: 5.6 10*3/uL (ref 4.0–10.5)
nRBC: 0 % (ref 0.0–0.2)

## 2022-07-31 LAB — PROTIME-INR
INR: 1.1 (ref 0.8–1.2)
Prothrombin Time: 14.1 seconds (ref 11.4–15.2)

## 2022-07-31 LAB — COMPREHENSIVE METABOLIC PANEL
ALT: 11 U/L (ref 0–44)
AST: 18 U/L (ref 15–41)
Albumin: 3.9 g/dL (ref 3.5–5.0)
Alkaline Phosphatase: 93 U/L (ref 38–126)
Anion gap: 16 — ABNORMAL HIGH (ref 5–15)
BUN: 21 mg/dL (ref 8–23)
CO2: 27 mmol/L (ref 22–32)
Calcium: 9.7 mg/dL (ref 8.9–10.3)
Chloride: 94 mmol/L — ABNORMAL LOW (ref 98–111)
Creatinine, Ser: 9.13 mg/dL — ABNORMAL HIGH (ref 0.61–1.24)
GFR, Estimated: 6 mL/min — ABNORMAL LOW (ref >60)
Glucose, Bld: 90 mg/dL (ref 70–99)
Potassium: 4.9 mmol/L (ref 3.5–5.1)
Sodium: 137 mmol/L (ref 135–145)
Total Bilirubin: 0.5 mg/dL (ref 0.3–1.2)
Total Protein: 7.9 g/dL (ref 6.5–8.1)

## 2022-07-31 LAB — I-STAT CHEM 8, ED
BUN: 23 mg/dL (ref 8–23)
Calcium, Ion: 1.02 mmol/L — ABNORMAL LOW (ref 1.15–1.40)
Chloride: 98 mmol/L (ref 98–111)
Creatinine, Ser: 10.4 mg/dL — ABNORMAL HIGH (ref 0.61–1.24)
Glucose, Bld: 88 mg/dL (ref 70–99)
HCT: 41 % (ref 39.0–52.0)
Hemoglobin: 13.9 g/dL (ref 13.0–17.0)
Potassium: 4.8 mmol/L (ref 3.5–5.1)
Sodium: 134 mmol/L — ABNORMAL LOW (ref 135–145)
TCO2: 27 mmol/L (ref 22–32)

## 2022-07-31 LAB — APTT: aPTT: 26 seconds (ref 24–36)

## 2022-07-31 LAB — PHOSPHORUS: Phosphorus: 5.8 mg/dL — ABNORMAL HIGH (ref 2.5–4.6)

## 2022-07-31 LAB — MAGNESIUM: Magnesium: 2.3 mg/dL (ref 1.7–2.4)

## 2022-07-31 LAB — HIV ANTIBODY (ROUTINE TESTING W REFLEX): HIV Screen 4th Generation wRfx: NONREACTIVE

## 2022-07-31 LAB — HEPATITIS B SURFACE ANTIGEN: Hepatitis B Surface Ag: NONREACTIVE

## 2022-07-31 LAB — ETHANOL: Alcohol, Ethyl (B): <10 mg/dL (ref <10)

## 2022-07-31 MED ORDER — CLONIDINE HCL 0.1 MG PO TABS
0.1000 mg | ORAL_TABLET | Freq: Two times a day (BID) | ORAL | Status: DC
  Administered 2022-07-31 – 2022-08-03 (×5): 0.1 mg via ORAL
  Filled 2022-07-31 (×5): qty 1

## 2022-07-31 MED ORDER — ACETAMINOPHEN 650 MG RE SUPP: 650.0000 mg | Freq: Four times a day (QID) | RECTAL | Status: DC | PRN

## 2022-07-31 MED ORDER — DILTIAZEM HCL ER COATED BEADS 180 MG PO CP24
180.0000 mg | ORAL_CAPSULE | Freq: Every day | ORAL | Status: DC
  Administered 2022-07-31 – 2022-08-03 (×3): 180 mg via ORAL
  Filled 2022-07-31 (×4): qty 1

## 2022-07-31 MED ORDER — LABETALOL HCL 5 MG/ML IV SOLN
10.0000 mg | INTRAVENOUS | Status: DC | PRN
  Administered 2022-07-31: 10 mg via INTRAVENOUS
  Filled 2022-07-31: qty 4

## 2022-07-31 MED ORDER — ONDANSETRON HCL 4 MG PO TABS: 4.0000 mg | ORAL_TABLET | Freq: Four times a day (QID) | ORAL | Status: DC | PRN

## 2022-07-31 MED ORDER — ATORVASTATIN CALCIUM 10 MG PO TABS
20.0000 mg | ORAL_TABLET | Freq: Every day | ORAL | Status: DC
  Administered 2022-08-01 – 2022-08-02 (×2): 20 mg via ORAL
  Filled 2022-07-31 (×2): qty 2

## 2022-07-31 MED ORDER — LEVETIRACETAM IN NACL 500 MG/100ML IV SOLN
500.0000 mg | Freq: Two times a day (BID) | INTRAVENOUS | Status: DC
  Administered 2022-07-31 – 2022-08-02 (×4): 500 mg via INTRAVENOUS
  Filled 2022-07-31 (×5): qty 100

## 2022-07-31 MED ORDER — APIXABAN 5 MG PO TABS
5.0000 mg | ORAL_TABLET | Freq: Two times a day (BID) | ORAL | Status: DC
  Administered 2022-07-31 – 2022-08-03 (×6): 5 mg via ORAL
  Filled 2022-07-31 (×6): qty 1

## 2022-07-31 MED ORDER — LEVETIRACETAM IN NACL 500 MG/100ML IV SOLN: 500.0000 mg | Freq: Two times a day (BID) | INTRAVENOUS | Status: DC

## 2022-07-31 MED ORDER — AMIODARONE HCL 200 MG PO TABS
200.0000 mg | ORAL_TABLET | Freq: Every day | ORAL | Status: DC
  Administered 2022-07-31 – 2022-08-03 (×3): 200 mg via ORAL
  Filled 2022-07-31 (×3): qty 1

## 2022-07-31 MED ORDER — ONDANSETRON HCL 4 MG/2ML IJ SOLN: 4.0000 mg | Freq: Four times a day (QID) | INTRAMUSCULAR | Status: DC | PRN

## 2022-07-31 MED ORDER — CHLORHEXIDINE GLUCONATE CLOTH 2 % EX PADS: 6.0000 | MEDICATED_PAD | Freq: Every day | CUTANEOUS | Status: DC

## 2022-07-31 MED ORDER — ACETAMINOPHEN 325 MG PO TABS
650.0000 mg | ORAL_TABLET | Freq: Four times a day (QID) | ORAL | Status: DC | PRN
  Filled 2022-07-31: qty 2

## 2022-07-31 NOTE — ED Notes (Signed)
Pt axo x4 at this time.

## 2022-07-31 NOTE — ED Notes (Signed)
Family at the bedside.

## 2022-07-31 NOTE — ED Notes (Signed)
Pt verbalizes sister name and would like for me to call her and update her

## 2022-07-31 NOTE — Assessment & Plan Note (Signed)
Treating as HTN urgency while awaiting MRI. Labetalol IV PRN Goal BP per neuro is < 160.

## 2022-07-31 NOTE — Assessment & Plan Note (Signed)
Call nephro in AM for IP dialysis during stay. TTS dialysis though and just had this earlier today.

## 2022-07-31 NOTE — Consult Note (Addendum)
Renal Service Consult Note [facility]  Tanner Rollins 07/31/2022 Tanner Blazing, MD Requesting Physician: Dr. Aileen Rollins  Reason for Consult: ESRD pt w/ seizures HPI: The patient is a 71 y.o. year-old w/ PMH as below who presented to ED last night sent from SNF for seizure-like activity. Per SNF this was going on for about 1 hour. EMS administered IM versed 5mg . In ED pt was neglecting L side and not moving L arm/ leg per the ED reports. CT head was negative. CTA head and neck showed no LVO. Seizures stopped after 4 gm Keppra loading in ED. This am pt is alert and responsive. 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 Pt admitted. Asked to see for dialysis.   Pt seen in ED. Pt upset and doesn't remember coming here. Oriented to year, not place. Denies any SOB, CP, abd pain at this time.   ROS - denies CP, no joint pain, no HA, no blurry vision, no rash, no diarrhea, no nausea/ vomiting, no dysuria, no difficulty voiding   Past Medical History  Past Medical History:  Diagnosis Date   ESRD on hemodialysis ([place])    Tanner Rollins   Hypertension    PAF (paroxysmal atrial fibrillation) [facility])    Past Surgical History  Past Surgical History:  Procedure Laterality Date   ANGIOPLASTY Left 06/10/2022   Procedure: Balloon VENOPLASTY;  Surgeon: Angelia Mould, MD;  Location: [place];  Service: Vascular;  Laterality: Left;   AV FISTULA PLACEMENT Left 01/28/2022   Procedure: ARTERIOVENOUS (AV) FISTULA CREATION VS.GRAFT ARM;  Surgeon: Angelia Mould, MD;  Location: [place];  Service: Vascular;  Laterality: Left;   FISTULOGRAM Left 06/10/2022   Procedure: FISTULOGRAM;  Surgeon: Angelia Mould, MD;  Location: [place];  Service: Vascular;  Laterality: Left;   IR FLUORO GUIDE CV LINE RIGHT  01/24/2022   IR THORACENTESIS ASP PLEURAL SPACE W/IMG GUIDE  02/12/2022   IR US GUIDE VASC ACCESS RIGHT  01/24/2022   Family History History reviewed. No  pertinent family history. Social History  reports that he quit smoking about 32 years ago. His smoking use included cigarettes. He has never used smokeless tobacco. He reports that he does not drink alcohol and does not use drugs. Allergies No Known Allergies Home medications Prior to Admission medications   Medication Sig Start Date End Date Taking? Authorizing Provider  amiodarone (PACERONE) 200 MG tablet Take 1 tablet (200 mg total) by mouth daily. 02/23/22 08/01/23 Yes Donne Hazel, MD  apixaban (ELIQUIS) 5 MG TABS tablet Take 1 tablet (5 mg total) by mouth 2 (two) times daily. 01/31/22  Yes Shelly Coss, MD  atorvastatin (LIPITOR) 40 MG tablet Take 20 mg by mouth daily.   Yes [provider]  calcium acetate (PHOSLO) 667 MG capsule Take 2 capsules (1,334 mg total) by mouth 3 (three) times daily with meals. 01/31/22  Yes Shelly Coss, MD  cloNIDine (CATAPRES) 0.1 MG tablet Take 0.1 mg by mouth 2 (two) times daily.   Yes [provider]  diltiazem (CARDIZEM CD) 180 MG 24 hr capsule Take 1 capsule (180 mg total) by mouth daily. 02/01/22  Yes Shelly Coss, MD  Multiple Vitamin (MULTIVITAMIN WITH MINERALS) TABS tablet Take 1 tablet by mouth daily.   Yes [provider]  docusate sodium (COLACE) 100 MG capsule Take 1 capsule (100 mg total) by mouth 2 (two) times daily. 01/31/22   Shelly Coss, MD  Nutritional Supplements (FEEDING SUPPLEMENT, NEPRO  CARB STEADY,) LIQD Take 237 mLs by mouth every evening.    [provider]  polyethylene glycol (MIRALAX / GLYCOLAX) 17 g packet Take 17 g by mouth daily as needed for moderate constipation. 01/31/22   Shelly Coss, MD     Vitals:   07/31/22 1000 07/31/22 1030 07/31/22 1100 07/31/22 1207  BP: (!) 145/83 (!) 145/80 133/75   Pulse: 60 62 60 (!) 58  Resp: 16 15 16 15   Temp:      TempSrc:      SpO2: 99% 100% 99% 98%  Weight:      Height:       Exam Gen alert, no distress No rash, cyanosis or  gangrene Sclera anicteric, throat clear  No jvd or bruits Chest clear bilat to bases, no rales/ wheezing RRR no MRG Abd soft ntnd no mass or ascites +bs GU normal male MS no joint effusions or deformity Ext no LE or UE edema, no wounds or ulcers Neuro is alert, Ox 3 , nf    RIJ TDC in place    Home meds include - amiodarone, eliquis, lipitor, catapres 0.1 bid, cardizem 180 qd, prns     OP HD: [facility] [place] [facility] 4h  400/800   66kg  2/2.25 bath   TDC   Heparin 2000 - last HD 12/12, post wt 69.1kg, coming off 2kg or more + - doxercalciferol 4 ug IV tiw - no esa, last Hb 11.5 on 12/12   Assessment/ Plan: Status epilepticus - controlled now w/ IV keppra load. Per neurology. Has some Todd's paralysis of L side.  ESRD - on HD TTS. Had HD yesterday. Next HD tomorrow. HD access - has TDC and AVF, pt says they are using AVF w/ 2 needles. Will try to confirm.   HTN/vol - BP's controlled today, cont clonidine/ cardizem. 1kg up, euvolemic on exam. Goal SBP < 160.  Atrial fib - on po amio and cardizem MBD ckd - CCa and phos in range. Cont IV vdra.  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 Follow.     Tanner Ardian Haberland  MD 07/31/2022, 1:20 PM Recent Labs  Lab 07/30/22 2356 07/31/22 0002  HGB 12.8* 13.9  ALBUMIN 3.9  --   CALCIUM 9.7  --   PHOS 5.8*  --   CREATININE 9.13* 10.40*  K 4.9 4.8   Inpatient medications:  amiodarone  200 mg Oral Daily   apixaban  5 mg Oral BID   atorvastatin  20 mg Oral QHS   cloNIDine  0.1 mg Oral BID   diltiazem  180 mg Oral Daily    levETIRAcetam     acetaminophen **OR** acetaminophen, labetalol, LORazepam, ondansetron **OR** ondansetron (ZOFRAN) IV

## 2022-07-31 NOTE — ED Notes (Signed)
Patient transported to MRI 

## 2022-07-31 NOTE — ED Notes (Signed)
Patient is resting comfortably. Pt is very lethargic. MD at bedside accessed same.

## 2022-07-31 NOTE — H&P (Signed)
History and Physical    Patient: Tanner Rollins XLK:[id number] DOB: May 03, 1951 DOA: 07/30/2022 DOS: the patient was seen and examined on 07/31/2022 PCP: Pcp, No  Patient coming from: Home  Chief Complaint:  Chief Complaint  Patient presents with   Seizures   HPI: Tanner Rollins is a 71 y.o. male with medical history significant of ESRD on TTS dialysis (last had earlier today apparently).  Pt noted to be off balance, having jerking of L arm at 4pm today.  This apparently progressed to seizure activity, reportedly upwards of 1h per EMS when they were called.  5mg  versed by EMS, patient with L sided gaze preference as well as not moving L side on arrival to ED.  Seen by Dr. Rory Percy, code stroke.  CTA head and neck = no LVO.  Suspected to have seizure / status epilepticus.   Review of Systems: As mentioned in the history of present illness. All other systems reviewed and are negative. Past Medical History:  Diagnosis Date   ESRD on hemodialysis ([place])    [facility]   Hypertension    PAF (paroxysmal atrial fibrillation) [facility])    Past Surgical History:  Procedure Laterality Date   ANGIOPLASTY Left 06/10/2022   Procedure: Balloon VENOPLASTY;  Surgeon: Angelia Mould, MD;  Location: [place];  Service: Vascular;  Laterality: Left;   AV FISTULA PLACEMENT Left 01/28/2022   Procedure: ARTERIOVENOUS (AV) FISTULA CREATION VS.GRAFT ARM;  Surgeon: Angelia Mould, MD;  Location: [place];  Service: Vascular;  Laterality: Left;   FISTULOGRAM Left 06/10/2022   Procedure: FISTULOGRAM;  Surgeon: Angelia Mould, MD;  Location: [place];  Service: Vascular;  Laterality: Left;   IR FLUORO GUIDE CV LINE RIGHT  01/24/2022   IR THORACENTESIS ASP PLEURAL SPACE W/IMG GUIDE  02/12/2022   IR US GUIDE VASC ACCESS RIGHT  01/24/2022   Social History:  reports that he quit smoking about 32 years ago. His smoking use included cigarettes. He has never used smokeless tobacco. He reports that he  does not drink alcohol and does not use drugs.  No Known Allergies  History reviewed. No pertinent family history.  Prior to Admission medications   Medication Sig Start Date End Date Taking? Authorizing Provider  amiodarone (PACERONE) 200 MG tablet Take 1 tablet (200 mg total) by mouth daily. 02/23/22 08/01/23 Yes Donne Hazel, MD  apixaban (ELIQUIS) 5 MG TABS tablet Take 1 tablet (5 mg total) by mouth 2 (two) times daily. 01/31/22  Yes Shelly Coss, MD  atorvastatin (LIPITOR) 40 MG tablet Take 20 mg by mouth at bedtime.    [provider]  calcium acetate (PHOSLO) 667 MG capsule Take 2 capsules (1,334 mg total) by mouth 3 (three) times daily with meals. 01/31/22   Shelly Coss, MD  cloNIDine (CATAPRES) 0.1 MG tablet Take 0.1 mg by mouth 2 (two) times daily.    [provider]  diltiazem (CARDIZEM CD) 180 MG 24 hr capsule Take 1 capsule (180 mg total) by mouth daily. Patient taking differently: Take 180 mg by mouth every evening. 02/01/22   Shelly Coss, MD  docusate sodium (COLACE) 100 MG capsule Take 1 capsule (100 mg total) by mouth 2 (two) times daily. 01/31/22   Shelly Coss, MD  Multiple Vitamin (MULTIVITAMIN WITH MINERALS) TABS tablet Take 1 tablet by mouth daily.    [provider]  Nutritional Supplements (FEEDING SUPPLEMENT, NEPRO CARB STEADY,) LIQD Take 237 mLs by mouth every evening.    [provider]  polyethylene  glycol (MIRALAX / GLYCOLAX) 17 g packet Take 17 g by mouth daily as needed for moderate constipation. 01/31/22   Shelly Coss, MD    Physical Exam: Vitals:   07/31/22 0031 07/31/22 0100 07/31/22 0130 07/31/22 0200  BP: (!) 197/89 (!) 176/90 (!) 205/101 (!) 158/106  Pulse: 69 76 71 73  Resp: 16 17 15 14   Temp: 97.6 F (36.4 C)     TempSrc: Axillary     SpO2: 100% 100% 100% 100%  Weight:      Height:       Constitutional: NAD, calm, comfortable Eyes: PERRL, lids and conjunctivae normal ENMT: Mucous membranes are  moist. Posterior pharynx clear of any exudate or lesions.Normal dentition.  Neck: normal, supple, no masses, no thyromegaly Respiratory: clear to auscultation bilaterally, no wheezing, no crackles. Normal respiratory effort. No accessory muscle use.  Cardiovascular: Regular rate and rhythm, no murmurs / rubs / gallops. No extremity edema. 2+ pedal pulses. No carotid bruits.  Abdomen: no tenderness, no masses palpated. No hepatosplenomegaly. Bowel sounds positive.  Musculoskeletal: no clubbing / cyanosis. No joint deformity upper and lower extremities. Good ROM, no contractures. Normal muscle tone.  Skin: no rashes, lesions, ulcers. No induration Neurologic: Pt with decreased movement on L side compared to R, but now with improved mentation compared to presentation.  Talking, somewhat slurred speech. Psychiatric: Awake, oriented to situation and location, following commands.  Data Reviewed:    CT-head done aspects 10. CT angiography head and neck and CT perfusion study: No emergent LVO.  CT perfusion with 4 cc penumbra and no core very distal in the right hemisphere.     Latest Ref Rng & Units 07/31/2022   12:02 AM 07/30/2022   11:56 PM 06/10/2022    5:54 AM  CBC  WBC 4.0 - 10.5 K/uL  5.6    Hemoglobin 13.0 - 17.0 g/dL 13.9  12.8  12.9   Hematocrit 39.0 - 52.0 % 41.0  38.5  38.0   Platelets 150 - 400 K/uL  146        Latest Ref Rng & Units 07/31/2022   12:02 AM 07/30/2022   11:56 PM 06/10/2022    5:54 AM  CMP  Glucose 70 - 99 mg/dL 88  90  88   BUN 8 - 23 mg/dL 23  21  23    Creatinine 0.61 - 1.24 mg/dL 10.40  9.13  9.30   Sodium 135 - 145 mmol/L 134  137  138   Potassium 3.5 - 5.1 mmol/L 4.8  4.9  3.6   Chloride 98 - 111 mmol/L 98  94  99   CO2 22 - 32 mmol/L  27    Calcium 8.9 - 10.3 mg/dL  9.7    Total Protein 6.5 - 8.1 g/dL  7.9    Total Bilirubin 0.3 - 1.2 mg/dL  0.5    Alkaline Phos 38 - 126 U/L  93    AST 15 - 41 U/L  18    ALT 0 - 44 U/L  11     CXR shows  pulmonary vascular congestion  Assessment and Plan: * Status epilepticus (HCC) Seizures stopped after 4gm keppra it seems. Question if PRES, question if seizure secondary to stroke. See neuro consult note Stat MRI pending 4gm keppra load in ED Holding off on further keppra given the very large load for this dialysis patient until 2200 this evening when we will begin 500mg  BID per neuro instructions. Seizure precautions Despite above, pt seems to  be doing rather well, is awake, following commands, oriented to situation and location.  Still has Todd's paralysis and moving L side much less than R side at this point.  ESRD (end stage renal disease) on dialysis ([place]) Call nephro in AM for IP dialysis during stay. TTS dialysis though and just had this earlier today.  A-fib ([place]) Hold AC until MRI brain done. Rate / rhythm hopefully to remain controlled, thanks to h/o being on PO amiodarone and its extremely long half-life. Order [facility] depending on MRI results, and wether he's able to take POs by later this morning (seems actually likely at this point given ongoing improvement). Tele monitor  Severe hypertension Treating as HTN urgency while awaiting MRI. Labetalol IV PRN Goal BP per neuro is < 160.      Advance Care Planning:   Code Status: Full Code  Consults: Neurology  Family Communication: No family in room  Severity of Illness: The appropriate patient status for this patient is INPATIENT. Inpatient status is judged to be reasonable and necessary in order to provide the required intensity of service to ensure the patient's safety. The patient's presenting symptoms, physical exam findings, and initial radiographic and laboratory data in the context of their chronic comorbidities is felt to place them at high risk for further clinical deterioration. Furthermore, it is not anticipated that the patient will be medically stable for discharge from the hospital within 2 midnights of  admission.   * I certify that at the point of admission it is my clinical judgment that the patient will require inpatient hospital care spanning beyond 2 midnights from the point of admission due to high intensity of service, high risk for further deterioration and high frequency of surveillance required.*  Author: Etta Quill., DO 07/31/2022 2:32 AM  For on call review [email or web address].

## 2022-07-31 NOTE — ED Notes (Signed)
Patient taken to MRI at this time 

## 2022-07-31 NOTE — ED Notes (Signed)
EEG being completed at this time

## 2022-07-31 NOTE — Assessment & Plan Note (Addendum)
Hold AC until MRI brain done. Rate / rhythm hopefully to remain controlled, thanks to h/o being on PO amiodarone and its extremely long half-life. Order [facility] depending on MRI results, and wether he's able to take POs by later this morning (seems actually likely at this point given ongoing improvement). Tele monitor

## 2022-07-31 NOTE — Plan of Care (Signed)
Preliminary EEG with no ongoing seizures. Has received a rather large load for his renal function (4000 mg IV) Will hold of on additional Keppra till evening. Start Keppra 500 BID at 2200 hrs on 07/31/22  -- Amie Portland, MD Neurologist [facility] Pager: [phone]

## 2022-07-31 NOTE — Procedures (Signed)
History:  71 y.o. male past medical history of ESRD on hemodialysis, paroxysmal atrial fibrillation on Eliquis, hypertension presented via EMS from home with complaints of seizure activity.  EEG classification:  asleep  Description of the recording: The background rhythms of this recording consists of a fairly well modulated medium amplitude symmetric background activity . As the record starts, the patient is in the early stage II sleep during the recording, with rudimentary sleep spindles and vertex sharp wave activity seen. Photic stimulation was not performed. Hyperventilation was not performed. No epileptiform discharges seen during this recording. There was right focal slowing.   Abnormality: Right focal slowing  Impression: This is an abnormal EEG recording in the sleeping state due to presence of right focal slowing which is consistent with an area of neuronal dysfunction.   Alric Ran, MD

## 2022-07-31 NOTE — Progress Notes (Signed)
EEG complete - results pending 

## 2022-07-31 NOTE — Assessment & Plan Note (Addendum)
Seizures stopped after 4gm keppra it seems. Question if PRES, question if seizure secondary to stroke. See neuro consult note Stat MRI pending 4gm keppra load in ED Holding off on further keppra given the very large load for this dialysis patient until 2200 this evening when we will begin 500mg  BID per neuro instructions. Seizure precautions Despite above, pt seems to be doing rather well, is awake, following commands, oriented to situation and location.  Still has Todd's paralysis and moving L side much less than R side at this point.

## 2022-07-31 NOTE — ED Notes (Signed)
Attempted to call sister for pt no answer VM picked up no message left as there was no voice on recording and couldn't be sure it was the person attempting to be reached

## 2022-07-31 NOTE — ED Notes (Signed)
Alcario Drought, MD at bedside evaluating patient at this time

## 2022-07-31 NOTE — ED Notes (Signed)
Toothbrush tooth paste and mouth swabs given to pt sister

## 2022-07-31 NOTE — ED Notes (Signed)
MRI called at this time.

## 2022-07-31 NOTE — ED Notes (Signed)
Patient resting, no s/s of any distress. No s/s of any pain or discomfort. VSS. Will continue to monitor

## 2022-07-31 NOTE — ED Notes (Signed)
Patient brought back to ED 24 after MRI

## 2022-07-31 NOTE — Progress Notes (Addendum)
[facility] PROGRESS NOTE    Progress Note  Worley Radermacher  HYI:[id number] DOB: 28-Nov-1950 DOA: 07/30/2022 PCP: Pcp, No     Brief Narrative:   Cincere Zorn is an 71 y.o. male past medical history significant for end-stage renal disease on hemodialysis (last treatment earlier today) was noted to be off balance jerky movements on the left arm eventually had seizures per EMS for about an hour was given Versed CTA of the head showed no acute findings no LVO neurology was consulted.   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 He was started on IV Keppra. EtOH less than 10  ESRD (end stage renal disease) on dialysis [facility]): Nephrology has been consulted for possible HD today.  Severe hypertension: Started on IV labetalol as needed his blood pressure is improved. Resume home meds.  Paroxysmal A-fib (HCC) Resume amnio and Eliquis.   DVT prophylaxis: Eliquis Family Communication:none Status is: Inpatient Remains inpatient appropriate because: Inpatient due to seizure.    Code Status:     Code Status Orders  (From admission, onward)           Start     Ordered   07/31/22 0154  Full code  Continuous        07/31/22 0154           Code Status History     Date Active Date Inactive Code Status Order ID Comments User Context   02/02/2022 2032 02/13/2022 2355 Full Code [id number]  Collene Gobble, MD ED   01/23/2022 1730 01/31/2022 1747 Full Code [id number]  Gleason, Otilio Carpen, PA-C ED   03/11/2021 0928 03/12/2021 2355 DNR [id number]  Lacinda Axon, MD ED         IV Access:   Peripheral IV   Procedures and diagnostic studies:   DG Chest Port 1 View  Result Date: 07/30/2022 CLINICAL DATA:  Seizure EXAM: PORTABLE CHEST 1 VIEW COMPARISON:  02/12/2022 FINDINGS: Single frontal view of the chest demonstrate stable right internal jugular  dialysis catheter. The cardiac silhouette is unremarkable. Mild central vascular congestion without airspace disease, effusion, or pneumothorax. No acute bony abnormality. IMPRESSION: 1. Mild central vascular congestion.  No acute airspace disease. Electronically Signed   By: Randa Ngo M.D.   On: 07/30/2022 23:38   CT ANGIO HEAD NECK W WO CM W PERF (CODE STROKE)  Result Date: 07/30/2022 CLINICAL DATA:  Acute neurologic deficit EXAM: CT ANGIOGRAPHY HEAD AND NECK CT PERFUSION BRAIN TECHNIQUE: Multidetector CT imaging of the head and neck was performed using the standard protocol during bolus administration of intravenous contrast. Multiplanar CT image reconstructions and MIPs were obtained to evaluate the vascular anatomy. Carotid stenosis measurements (when applicable) are obtained utilizing NASCET criteria, using the distal internal carotid diameter as the denominator. Multiphase CT imaging of the brain was performed following IV bolus contrast injection. Subsequent parametric perfusion maps were calculated using RAPID software. RADIATION DOSE REDUCTION: This exam was performed according to the departmental dose-optimization program which includes automated exposure control, adjustment of the mA and/or kV according to patient size and/or use of iterative reconstruction technique. CONTRAST:  146mL OMNIPAQUE IOHEXOL 350 MG/ML SOLN COMPARISON:  None Available. FINDINGS: CTA NECK FINDINGS SKELETON: There is no bony spinal canal stenosis. No lytic or blastic lesion. OTHER NECK: Normal pharynx, larynx and major salivary glands. No cervical lymphadenopathy. Unremarkable thyroid gland. UPPER CHEST: No pneumothorax or pleural effusion. No nodules or  masses. AORTIC ARCH: There is no calcific atherosclerosis of the aortic arch. There is no aneurysm, dissection or hemodynamically significant stenosis of the visualized portion of the aorta. Conventional 3 vessel aortic branching pattern. The visualized proximal  subclavian arteries are widely patent. RIGHT CAROTID SYSTEM: Normal without aneurysm, dissection or stenosis. LEFT CAROTID SYSTEM: Normal without aneurysm, dissection or stenosis. VERTEBRAL ARTERIES: Left dominant configuration. Both origins are clearly patent. There is no dissection, occlusion or flow-limiting stenosis to the skull base (V1-V3 segments). CTA HEAD FINDINGS POSTERIOR CIRCULATION: --Vertebral arteries: Normal V4 segments. --Inferior cerebellar arteries: Normal. --Basilar artery: Normal. --Superior cerebellar arteries: Normal. --Posterior cerebral arteries (PCA): Normal. ANTERIOR CIRCULATION: --Intracranial internal carotid arteries: Normal. --Anterior cerebral arteries (ACA): Normal. Both A1 segments are present. Patent anterior communicating artery (a-comm). --Middle cerebral arteries (MCA): Normal. VENOUS SINUSES: As permitted by contrast timing, patent. ANATOMIC VARIANTS: None Review of the MIP images confirms the above findings. CT Brain Perfusion Findings: ASPECTS: 10 CBF (<30%) Volume: 77mL Perfusion (Tmax>6.0s) volume: 52mL reported, but likely artifactual given degree of motion Mismatch Volume: 36mL Infarction Location:None IMPRESSION: 1. No emergent large vessel occlusion or high-grade stenosis of the intracranial arteries. 2. No core infarct or ischemic penumbra identified by CT perfusion. Reported 4 mL mismatch is likely a motion/bolus timing artifact. Electronically Signed   By: Ulyses Jarred M.D.   On: 07/30/2022 23:35   CT HEAD CODE STROKE WO CONTRAST  Result Date: 07/30/2022 CLINICAL DATA:  Code stroke. EXAM: CT HEAD WITHOUT CONTRAST TECHNIQUE: Contiguous axial images were obtained from the base of the skull through the vertex without intravenous contrast. RADIATION DOSE REDUCTION: This exam was performed according to the departmental dose-optimization program which includes automated exposure control, adjustment of the mA and/or kV according to patient size and/or use of iterative  reconstruction technique. COMPARISON:  None Available. FINDINGS: Brain: There is no mass, hemorrhage or extra-axial collection. The size and configuration of the ventricles and extra-axial CSF spaces are normal. 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 There is periventricular hypoattenuation compatible with chronic microvascular disease. Focal encephalomalacia of the right parietal lobe, unchanged. Vascular: No abnormal hyperdensity of the major intracranial arteries or dural venous sinuses. No intracranial atherosclerosis. Skull: The visualized skull base, calvarium and extracranial soft tissues are normal. Sinuses/Orbits: No fluid levels or advanced mucosal thickening of the visualized paranasal sinuses. No mastoid or middle ear effusion. The orbits are normal. ASPECTS [facility] Stroke Program Early CT Score) - Ganglionic level infarction (caudate, lentiform nuclei, internal capsule, insula, M1-M3 cortex): 7 - Supraganglionic infarction (M4-M6 cortex): 3 Total score (0-10 with 10 being normal): 10 IMPRESSION: 1. No acute intracranial abnormality. 2. ASPECTS is 10. 3. 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 These results were communicated to Dr. Amie Portland at 11:06 pm on 07/30/2022 by text page via the [facility] messaging system. Electronically Signed   By: Ulyses Jarred M.D.   On: 07/30/2022 23:06     Medical Consultants:   None.   Subjective:    Carin Primrose sleepy this morning.  Objective:    Vitals:   07/31/22 0519 07/31/22 0530 07/31/22 0600 07/31/22 0630  BP: (!) 175/96 (!) 163/98 (!) 149/107 (!) 150/89  Pulse: 65 66 65 66  Resp: 14 17 15 18   Temp: 97.8 F (36.6 C)     TempSrc: Oral     SpO2: 99% 98% 100% 96%  Weight:      Height:       SpO2: 96 %  Intake/Output Summary (Last 24 hours) at 07/31/2022 0705 Last data filed at 07/31/2022 0103 Gross per 24 hour  Intake 290.92 ml  Output --  Net 290.92 ml   Filed Weights   07/30/22 2338   Weight: 67.1 kg    Exam: General exam: In no acute distress. Respiratory system: Good air movement and clear to auscultation. Cardiovascular system: S1 & S2 heard, RRR. No JVD.  Gastrointestinal system: Abdomen is nondistended, soft and nontender.  Central nervous system: Alert and oriented x 1 sleepy this morning moving all 4 extremities without any difficulties. Extremities: No pedal edema. Skin: No rashes, lesions or ulcers  Data Reviewed:    Labs: Basic Metabolic Panel: Recent Labs  Lab 07/30/22 2356 07/31/22 0002  NA 137 134*  K 4.9 4.8  CL 94* 98  CO2 27  --   GLUCOSE 90 88  BUN 21 23  CREATININE 9.13* 10.40*  CALCIUM 9.7  --   MG 2.3  --   PHOS 5.8*  --    GFR Estimated Creatinine Clearance: 6.3 mL/min (A) (by C-G formula based on SCr of 10.4 mg/dL (H)). Liver Function Tests: Recent Labs  Lab 07/30/22 2356  AST 18  ALT 11  ALKPHOS 93  BILITOT 0.5  PROT 7.9  ALBUMIN 3.9   No results for input(s): "LIPASE", "AMYLASE" in the last 168 hours. No results for input(s): "AMMONIA" in the last 168 hours. Coagulation profile Recent Labs  Lab 07/30/22 2356  INR 1.1   COVID-19 Labs  No results for input(s): "DDIMER", "FERRITIN", "LDH", "CRP" in the last 72 hours.  Lab Results  Component Value Date   [place] NEGATIVE 03/11/2021    CBC: Recent Labs  Lab 07/30/22 2356 07/31/22 0002  WBC 5.6  --   NEUTROABS 4.7  --   HGB 12.8* 13.9  HCT 38.5* 41.0  MCV 90.2  --   PLT 146*  --    Cardiac Enzymes: No results for input(s): "CKTOTAL", "CKMB", "CKMBINDEX", "TROPONINI" in the last 168 hours. BNP (last 3 results) No results for input(s): "PROBNP" in the last 8760 hours. CBG: No results for input(s): "GLUCAP" in the last 168 hours. D-Dimer: No results for input(s): "DDIMER" in the last 72 hours. Hgb A1c: No results for input(s): "HGBA1C" in the last 72 hours. Lipid Profile: No results for input(s): "CHOL", "HDL", "LDLCALC", "TRIG", "CHOLHDL",  "LDLDIRECT" in the last 72 hours. Thyroid function studies: No results for input(s): "TSH", "T4TOTAL", "T3FREE", "THYROIDAB" in the last 72 hours.  Invalid input(s): "FREET3" Anemia work up: No results for input(s): "VITAMINB12", "FOLATE", "FERRITIN", "TIBC", "IRON", "RETICCTPCT" in the last 72 hours. Sepsis Labs: Recent Labs  Lab 07/30/22 2356  WBC 5.6   Microbiology No results found for this or any previous visit (from the past 240 hour(s)).   Medications:    Continuous Infusions:  levETIRAcetam        LOS: 0 days   Charlynne Cousins  [facility]  07/31/2022, 7:05 AM

## 2022-08-01 DIAGNOSIS — R569 Unspecified convulsions: Secondary | ICD-10-CM

## 2022-08-01 LAB — CBC
HCT: 34.9 % — ABNORMAL LOW (ref 39.0–52.0)
Hemoglobin: 11.2 g/dL — ABNORMAL LOW (ref 13.0–17.0)
MCH: 29.7 pg (ref 26.0–34.0)
MCHC: 32.1 g/dL (ref 30.0–36.0)
MCV: 92.6 fL (ref 80.0–100.0)
Platelets: 158 10*3/uL (ref 150–400)
RBC: 3.77 MIL/uL — ABNORMAL LOW (ref 4.22–5.81)
RDW: 18 % — ABNORMAL HIGH (ref 11.5–15.5)
WBC: 5.1 10*3/uL (ref 4.0–10.5)
nRBC: 0 % (ref 0.0–0.2)

## 2022-08-01 LAB — RENAL FUNCTION PANEL
Albumin: 3.3 g/dL — ABNORMAL LOW (ref 3.5–5.0)
Anion gap: 20 — ABNORMAL HIGH (ref 5–15)
BUN: 40 mg/dL — ABNORMAL HIGH (ref 8–23)
CO2: 22 mmol/L (ref 22–32)
Calcium: 9.4 mg/dL (ref 8.9–10.3)
Chloride: 95 mmol/L — ABNORMAL LOW (ref 98–111)
Creatinine, Ser: 13.26 mg/dL — ABNORMAL HIGH (ref 0.61–1.24)
GFR, Estimated: 4 mL/min — ABNORMAL LOW (ref >60)
Glucose, Bld: 69 mg/dL — ABNORMAL LOW (ref 70–99)
Phosphorus: 8.7 mg/dL — ABNORMAL HIGH (ref 2.5–4.6)
Potassium: 5.3 mmol/L — ABNORMAL HIGH (ref 3.5–5.1)
Sodium: 137 mmol/L (ref 135–145)

## 2022-08-01 MED ORDER — HEPARIN SODIUM (PORCINE) 1000 UNIT/ML DIALYSIS
2000.0000 [IU] | INTRAMUSCULAR | Status: AC | PRN
  Administered 2022-08-01: 2000 [IU] via INTRAVENOUS_CENTRAL
  Filled 2022-08-01: qty 2

## 2022-08-01 MED ORDER — HEPARIN SODIUM (PORCINE) 1000 UNIT/ML IJ SOLN
INTRAMUSCULAR | Status: AC
  Filled 2022-08-01: qty 4

## 2022-08-01 NOTE — Progress Notes (Signed)
[facility] Progress Note  Subjective: seen in ED, responsive and w/o c/o's  Vitals:   08/01/22 1052 08/01/22 1100 08/01/22 1115 08/01/22 1130  BP: (!) 164/75 (!) 153/81 (!) 148/87 139/75  Pulse: 61 65 60 (!) 59  Resp: 14 14 13 13   Temp:      TempSrc:      SpO2: 100% 99% 100% 100%  Weight:      Height:        Exam: Gen alert, no distress No jvd or bruits Chest clear bilat to bases RRR no MRG Abd soft ntnd no mass or ascites +bs Ext no LE edema Neuro is alert, Ox 3 , nf    RIJ TDC in place      Home meds include - amiodarone, eliquis, lipitor, catapres 0.1 bid, cardizem 180 qd, prns        OP HD: [place] TTS 4h  400/800   66kg  2/2.25 bath   TDC   Heparin 2000 - last HD 12/12, post wt 69.1kg, coming off 2kg or more + - doxercalciferol 4 ug IV tiw - no esa, last Hb 11.5 on 12/12     Assessment/ Plan: Status epilepticus - controlled now w/ IV keppra load in ED. Per neurology.  ESRD - on HD TTS. No missed HD. HD today.  HD access - has TDC and AVF. OP unit tried 16ga needles and it didn't work so they are using 17ga needles.  HTN/vol - BP's controlled on clonidine/ cardizem. 1-2kg up, euvolemic on exam. Goal SBP < 160. UFG 1-2L. Atrial fib - on po amio and cardizem MBD ckd - CCa and phos in range. Cont IV vdra.  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 Follow.     Rob Kou Gucciardo 08/01/2022, 1:30 PM   Recent Labs  Lab 07/30/22 2356 07/31/22 0002 08/01/22 1015  HGB 12.8* 13.9  --   ALBUMIN 3.9  --  3.3*  CALCIUM 9.7  --  9.4  PHOS 5.8*  --  8.7*  CREATININE 9.13* 10.40* 13.26*  K 4.9 4.8 5.3*   No results for input(s): "IRON", "TIBC", "FERRITIN" in the last 168 hours. Inpatient medications:  amiodarone  200 mg Oral Daily   apixaban  5 mg Oral BID   atorvastatin  20 mg Oral QHS   Chlorhexidine Gluconate Cloth  6 each Topical Q0600   cloNIDine  0.1 mg Oral BID   diltiazem  180 mg Oral Daily    levETIRAcetam Stopped (08/01/22 1202)    acetaminophen **OR** acetaminophen, [START ON 08/02/2022] heparin, labetalol, LORazepam, ondansetron **OR** ondansetron (ZOFRAN) IV

## 2022-08-01 NOTE — Progress Notes (Addendum)
Subjective: No complaints. Lying in bed comfortably.   Objective: Current vital signs: BP (!) 157/85   Pulse (!) 55   Temp 98.4 F (36.9 C) (Oral)   Resp 14   Ht 5\' 11"  (1.803 m)   Wt 67.1 kg   SpO2 100%   BMI 20.64 kg/m  Vital signs in last 24 hours: Temp:  [97.8 F (36.6 C)-98.4 F (36.9 C)] 98.4 F (36.9 C) (12/14 0509) Pulse Rate:  [48-65] 55 (12/14 0630) Resp:  [9-19] 14 (12/14 0630) BP: (95-162)/(57-96) 157/85 (12/14 0630) SpO2:  [97 %-100 %] 100 % (12/14 0630)  Intake/Output from previous day: No intake/output data recorded. Intake/Output this shift: No intake/output data recorded. Nutritional status:  Diet Order             Diet NPO time specified  Diet effective now                   Neurologic Exam: Mentation: Mild drowsiness. Appears somewhat fatigued. No dysarthria. Speech is sparse but fluent with intact comprehension and naming. Oriented to the hospital, city, state, year, month and day of the week.  Cranial nerves: PERRL. EOMI without gaze preference today. No nystagmus. Visual fields intact. Tracks and fixates normally on visual stimuli. No facial droop.  Motor: Tone is normal x 4. BUE and BLE 5/5.   Sensation: Intact to touch and temp in all 4 extremities proximally Reflexes: 2+ bilateral brachioradialis and patellar. Toes downgoing.  Cerebellar: No ataxia with right FNF. Had some persistent dysmetria/targeting deficit with left FNF  Lab Results: Results for orders placed or performed during the hospital encounter of 07/30/22 (from the past 48 hour(s))  Comprehensive metabolic panel     Status: Abnormal   Collection Time: 07/30/22 11:56 PM  Result Value Ref Range   Sodium 137 135 - 145 mmol/L   Potassium 4.9 3.5 - 5.1 mmol/L   Chloride 94 (L) 98 - 111 mmol/L   CO2 27 22 - 32 mmol/L   Glucose, Bld 90 70 - 99 mg/dL    Comment: Glucose reference range applies only to samples taken after fasting for at least 8 hours.   BUN 21 8 - 23 mg/dL    Creatinine, Ser 9.13 (H) 0.61 - 1.24 mg/dL   Calcium 9.7 8.9 - 10.3 mg/dL   Total Protein 7.9 6.5 - 8.1 g/dL   Albumin 3.9 3.5 - 5.0 g/dL   AST 18 15 - 41 U/L   ALT 11 0 - 44 U/L   Alkaline Phosphatase 93 38 - 126 U/L   Total Bilirubin 0.5 0.3 - 1.2 mg/dL   GFR, Estimated 6 (L) >60 mL/min    Comment: (NOTE) Calculated using the CKD-EPI Creatinine Equation (2021)    Anion gap 16 (H) 5 - 15    Comment: Performed at [facility] [address] [place], [place] [address]  CBC with Differential/Platelet     Status: Abnormal   Collection Time: 07/30/22 11:56 PM  Result Value Ref Range   WBC 5.6 4.0 - 10.5 K/uL   RBC 4.27 4.22 - 5.81 MIL/uL   Hemoglobin 12.8 (L) 13.0 - 17.0 g/dL   HCT 38.5 (L) 39.0 - 52.0 %   MCV 90.2 80.0 - 100.0 fL   MCH 30.0 26.0 - 34.0 pg   MCHC 33.2 30.0 - 36.0 g/dL   RDW 17.6 (H) 11.5 - 15.5 %   Platelets 146 (L) 150 - 400 K/uL   nRBC 0.0 0.0 - 0.2 %  Neutrophils Relative % 83 %   Neutro Abs 4.7 1.7 - 7.7 K/uL   Lymphocytes Relative 10 %   Lymphs Abs 0.5 (L) 0.7 - 4.0 K/uL   Monocytes Relative 5 %   Monocytes Absolute 0.3 0.1 - 1.0 K/uL   Eosinophils Relative 0 %   Eosinophils Absolute 0.0 0.0 - 0.5 K/uL   Basophils Relative 1 %   Basophils Absolute 0.0 0.0 - 0.1 K/uL   Immature Granulocytes 1 %   Abs Immature Granulocytes 0.06 0.00 - 0.07 K/uL    Comment: Performed at [facility] [address], [place], [place] [address]  Magnesium     Status: None   Collection Time: 07/30/22 11:56 PM  Result Value Ref Range   Magnesium 2.3 1.7 - 2.4 mg/dL    Comment: Performed at [facility], [place] [address] [place], [place] [address]  Phosphorus     Status: Abnormal   Collection Time: 07/30/22 11:56 PM  Result Value Ref Range   Phosphorus 5.8 (H) 2.5 - 4.6 mg/dL    Comment: Performed at [facility] [address], [place], [place] [address]  Protime-INR     Status: None   Collection Time: 07/30/22 11:56 PM  Result Value Ref Range    Prothrombin Time 14.1 11.4 - 15.2 seconds   INR 1.1 0.8 - 1.2    Comment: (NOTE) INR goal varies based on device and disease states. Performed at [facility], [place] [address], [place], [place] [address]   APTT     Status: None   Collection Time: 07/30/22 11:56 PM  Result Value Ref Range   aPTT 26 24 - 36 seconds    Comment: Performed at [facility] [address] [place], [place] [address]  I-stat chem 8, ED     Status: Abnormal   Collection Time: 07/31/22 12:02 AM  Result Value Ref Range   Sodium 134 (L) 135 - 145 mmol/L   Potassium 4.8 3.5 - 5.1 mmol/L   Chloride 98 98 - 111 mmol/L   BUN 23 8 - 23 mg/dL   Creatinine, Ser 10.40 (H) 0.61 - 1.24 mg/dL   Glucose, Bld 88 70 - 99 mg/dL    Comment: Glucose reference range applies only to samples taken after fasting for at least 8 hours.   Calcium, Ion 1.02 (L) 1.15 - 1.40 mmol/L   TCO2 27 22 - 32 mmol/L   Hemoglobin 13.9 13.0 - 17.0 g/dL   HCT 41.0 39.0 - 52.0 %  Ethanol     Status: None   Collection Time: 07/31/22 12:25 AM  Result Value Ref Range   Alcohol, Ethyl (B) <10 <10 mg/dL    Comment: (NOTE) Lowest detectable limit for serum alcohol is 10 mg/dL.  For medical purposes only. Performed at [facility], [place] [address], [place], [place] [address]   HIV Antibody (routine testing w rflx)     Status: None   Collection Time: 07/31/22  6:47 AM  Result Value Ref Range   HIV Screen 4th Generation wRfx Non Reactive Non Reactive    Comment: Performed at [facility], [place] [address], [place], [place] [address]  Hepatitis B surface antigen     Status: None   Collection Time: 07/31/22  6:09 PM  Result Value Ref Range   Hepatitis B Surface Ag NON REACTIVE NON REACTIVE    Comment: Performed at [facility] [address],  [place],  [address]    No results found for this or any previous visit (from the past 240 hour(s)).  Lipid Panel No results for input(s): "CHOL", "TRIG", "HDL",  "CHOLHDL", "VLDL", "LDLCALC" in the last 72 hours.  Studies/Results: MR BRAIN WO CONTRAST  Result Date: 07/31/2022 CLINICAL DATA:  Neuro deficit, acute, stroke suspected EXAM: MRI HEAD WITHOUT CONTRAST TECHNIQUE: Multiplanar, multiecho pulse sequences of the brain and surrounding structures were obtained without intravenous contrast. COMPARISON:  CT head 07/30/2022. FINDINGS: Brain: No definite evidence of acute infarct. Small foci of DWI hyperintensity in the right parieto-occipital region are favored artifactual given areas of susceptibility artifact in this region. Multiple areas of prior hemorrhage with associated susceptibility artifact throughout the infratentorial (including brainstem and cerebellum) and supratentorial brain (including thalami, basal ganglia and cerebral hemispheres). Also, this includes prior lobar hemorrhagic infarcts in the right basal ganglia and right parietal lobe with associated encephalomalacia. No evidence of acute hemorrhage, mass lesion or midline shift. Vascular: Major arterial flow voids are maintained the skull base. Skull and upper cervical spine: Normal marrow signal. Sinuses/Orbits: Clear sinuses.  No acute orbital findings. Other: No mastoid effusions. IMPRESSION: 1. No definite evidence of acute infarct. Small foci of DWI hyperintensity in the right parieto-occipital region are favored artifactual given areas of susceptibility artifact in this region. 2. Many prior lobar and microhemorrhages, which are detailed above and could be related to chronic hypertension and/or amyloid angiopathy. Electronically Signed   By: Margaretha Sheffield M.D.   On: 07/31/2022 13:10   EEG adult  Result Date: 07/31/2022 Alric Ran, MD     07/31/2022  9:49 AM History: 71 y.o. male past medical history of ESRD on hemodialysis, paroxysmal atrial fibrillation on Eliquis, hypertension presented via EMS from home with complaints of seizure activity. EEG classification:  asleep Description  of the recording: The background rhythms of this recording consists of a fairly well modulated medium amplitude symmetric background activity . As the record starts, the patient is in the early stage II sleep during the recording, with rudimentary sleep spindles and vertex sharp wave activity seen. Photic stimulation was not performed. Hyperventilation was not performed. No epileptiform discharges seen during this recording. There was right focal slowing. Abnormality: Right focal slowing Impression: This is an abnormal EEG recording in the sleeping state due to presence of right focal slowing which is consistent with an area of neuronal dysfunction. Alric Ran, MD   DG Chest Port 1 View  Result Date: 07/30/2022 CLINICAL DATA:  Seizure EXAM: PORTABLE CHEST 1 VIEW COMPARISON:  02/12/2022 FINDINGS: Single frontal view of the chest demonstrate stable right internal jugular dialysis catheter. The cardiac silhouette is unremarkable. Mild central vascular congestion without airspace disease, effusion, or pneumothorax. No acute bony abnormality. IMPRESSION: 1. Mild central vascular congestion.  No acute airspace disease. Electronically Signed   By: Randa Ngo M.D.   On: 07/30/2022 23:38   CT ANGIO HEAD NECK W WO CM W PERF (CODE STROKE)  Result Date: 07/30/2022 CLINICAL DATA:  Acute neurologic deficit EXAM: CT ANGIOGRAPHY HEAD AND NECK CT PERFUSION BRAIN TECHNIQUE: Multidetector CT imaging of the head and neck was performed using the standard protocol during bolus administration of intravenous contrast. Multiplanar CT image reconstructions and MIPs were obtained to evaluate the vascular anatomy. Carotid stenosis measurements (when applicable) are obtained utilizing NASCET criteria, using the distal internal carotid diameter as the denominator. Multiphase CT imaging of the brain was performed following IV bolus contrast injection. Subsequent parametric perfusion maps were calculated using  RAPID software.  RADIATION DOSE REDUCTION: This exam was performed according to the departmental dose-optimization program which includes automated exposure control, adjustment of the mA and/or kV according to patient size and/or use of iterative reconstruction technique. CONTRAST:  166mL OMNIPAQUE IOHEXOL 350 MG/ML SOLN COMPARISON:  None Available. FINDINGS: CTA NECK FINDINGS SKELETON: There is no bony spinal canal stenosis. No lytic or blastic lesion. OTHER NECK: Normal pharynx, larynx and major salivary glands. No cervical lymphadenopathy. Unremarkable thyroid gland. UPPER CHEST: No pneumothorax or pleural effusion. No nodules or masses. AORTIC ARCH: There is no calcific atherosclerosis of the aortic arch. There is no aneurysm, dissection or hemodynamically significant stenosis of the visualized portion of the aorta. Conventional 3 vessel aortic branching pattern. The visualized proximal subclavian arteries are widely patent. RIGHT CAROTID SYSTEM: Normal without aneurysm, dissection or stenosis. LEFT CAROTID SYSTEM: Normal without aneurysm, dissection or stenosis. VERTEBRAL ARTERIES: Left dominant configuration. Both origins are clearly patent. There is no dissection, occlusion or flow-limiting stenosis to the skull base (V1-V3 segments). CTA HEAD FINDINGS POSTERIOR CIRCULATION: --Vertebral arteries: Normal V4 segments. --Inferior cerebellar arteries: Normal. --Basilar artery: Normal. --Superior cerebellar arteries: Normal. --Posterior cerebral arteries (PCA): Normal. ANTERIOR CIRCULATION: --Intracranial internal carotid arteries: Normal. --Anterior cerebral arteries (ACA): Normal. Both A1 segments are present. Patent anterior communicating artery (a-comm). --Middle cerebral arteries (MCA): Normal. VENOUS SINUSES: As permitted by contrast timing, patent. ANATOMIC VARIANTS: None Review of the MIP images confirms the above findings. CT Brain Perfusion Findings: ASPECTS: 10 CBF (<30%) Volume: 81mL Perfusion (Tmax>6.0s) volume: 68mL  reported, but likely artifactual given degree of motion Mismatch Volume: 35mL Infarction Location:None IMPRESSION: 1. No emergent large vessel occlusion or high-grade stenosis of the intracranial arteries. 2. No core infarct or ischemic penumbra identified by CT perfusion. Reported 4 mL mismatch is likely a motion/bolus timing artifact. Electronically Signed   By: Ulyses Jarred M.D.   On: 07/30/2022 23:35   CT HEAD CODE STROKE WO CONTRAST  Result Date: 07/30/2022 CLINICAL DATA:  Code stroke. EXAM: CT HEAD WITHOUT CONTRAST TECHNIQUE: Contiguous axial images were obtained from the base of the skull through the vertex without intravenous contrast. RADIATION DOSE REDUCTION: This exam was performed according to the departmental dose-optimization program which includes automated exposure control, adjustment of the mA and/or kV according to patient size and/or use of iterative reconstruction technique. COMPARISON:  None Available. FINDINGS: Brain: There is no mass, hemorrhage or extra-axial collection. The size and configuration of the ventricles and extra-axial CSF spaces are normal. 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 There is periventricular hypoattenuation compatible with chronic microvascular disease. Focal encephalomalacia of the right parietal lobe, unchanged. Vascular: No abnormal hyperdensity of the major intracranial arteries or dural venous sinuses. No intracranial atherosclerosis. Skull: The visualized skull base, calvarium and extracranial soft tissues are normal. Sinuses/Orbits: No fluid levels or advanced mucosal thickening of the visualized paranasal sinuses. No mastoid or middle ear effusion. The orbits are normal. ASPECTS [facility] Stroke Program Early CT Score) - Ganglionic level infarction (caudate, lentiform nuclei, internal capsule, insula, M1-M3 cortex): 7 - Supraganglionic infarction (M4-M6 cortex): 3 Total score (0-10 with 10 being normal): 10 IMPRESSION: 1. No acute  intracranial abnormality. 2. ASPECTS is 10. 3. 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 These results were communicated to Dr. Amie Portland at 11:06 pm on 07/30/2022 by text page via the [facility] messaging system. Electronically Signed   By: Ulyses Jarred M.D.   On: 07/30/2022 23:06    Medications: Scheduled:  amiodarone  200 mg Oral Daily   apixaban  5 mg Oral BID   atorvastatin  20 mg Oral QHS   Chlorhexidine Gluconate Cloth  6 each Topical Q0600   cloNIDine  0.1 mg Oral BID   diltiazem  180 mg Oral Daily   Continuous:  levETIRAcetam Stopped (07/31/22 2319)    Assessment: 70 year old man with history of ESRD, paroxysmal A-fib on Eliquis, presenting for evaluation of seizure activity that lasted about 1 hour prior to subsiding with IM Versed given by EMS. At baseline he is independent and lives in an apartment by himself. Initially he had been noted to be off balance with jerky movements of the left arm progressing to continuous focal seizure activity involving the LUE lasting for about an hour as witnessed by EMS - Exam today: Significantly improved since yesterday. No focal deficit except for mild dysmetria with left FNF.  - EEG 12/13: This is an abnormal EEG recording in the sleeping state due to presence of right focal slowing which is consistent with an area of neuronal dysfunction.  - He appeared postictal on initial Neurology evaluation Tuesday night. He had gaze to the left and left-sided weakness that did not fit well with a stroke but rather seizure with Todd's paralysis. Subsequent EEG did not show ongoing seizure activity, but did show right hemispheric slowing, consistent with postictal state and correlating with his initial clinical exam.  - MRI brain:  No definite evidence of acute infarct. Small foci of DWI hyperintensity in the right parieto-occipital region are favored artifactual given areas of susceptibility artifact in this region. Many old microhemorrhages and one  right parietal medium sized region of chronic hemosiderin likely secondary to remote gross hemorrhage are seen, which could be related to chronic hypertension and/or amyloid angiopathy (he has a diagnosis of severe HTN). - Most likely etiology for his new onset seizure are the microbleeds vs the chronic right parietal hemorrhage seen on MRI, which can serve as seizure foci   Impression: New onset seizure with status epilepticus, now resolved    Recommendations: - Continue Keppra 500 mg twice daily - Maintain seizure precautions - In the context of the numerous old microbleeds and medium sized or right parietal bleed seen on MRI, risks/benefits of Eliquis favor continuation if HTN can be well-controlled over the long term.  - PT/OT - If able to ambulate safely, he can be discharged home from a neurology standpoint with close outpatient neurology follow up and instructions to remain compliant with Keppra - Per [facility] statutes, patients with seizures are not allowed to drive until  they have been seizure-free for six months. Use caution when using heavy equipment or power tools. Avoid working on ladders or at heights. Take showers instead of baths. Ensure the water temperature is not too high on the home water heater. Do not go swimming alone. When caring for infants or small children, sit down when holding, feeding, or changing them to minimize risk of injury to the child in the event you have a seizure. Also, Maintain good sleep hygiene. Avoid alcohol. - Neurohospitalist service will sign off. Please call if there are additional questions   LOS: 1 day   @Electronically  signed: Dr. Kerney Elbe 08/01/2022  7:28 AM

## 2022-08-01 NOTE — ED Notes (Signed)
Neuro MD at bedside

## 2022-08-01 NOTE — Procedures (Signed)
HD Note:  Some information was entered later than the data was gathered due to patient care needs. The stated time with the data is accurate.  Received patient in bed to unit.  Alert and oriented.  Informed consent signed and in chart. Anice Paganini ordered UF to be 3500 as tolerated related to his hypertension.  Treatment started using the patient's Left upper arm fistula.  This clotted part of the way through the treatment.  The patient stated he used the HD catheter as well.  The rest of the treatment was done using the HD catheter without issue.  Patient began cramping in his hands and the UF was turned off.  100 ml of saline was given.  When the cramping did not subside, 100 ml mo   Alert, without acute distress.  Hand-off given to patient's nurse.    Total UF removed: [facility]

## 2022-08-01 NOTE — ED Notes (Signed)
ED TO INPATIENT HANDOFF REPORT  ED Nurse Name and Phone #: [phone]  S Name/Age/Gender Tanner Rollins 71 y.o. male Room/Bed: 012C/012C  Code Status   Code Status: Full Code  Home/SNF/Other Nursing Home Patient oriented to: self, place, time, and situation Is this baseline? Yes   Triage Complete: Triage complete  Chief Complaint Status epilepticus [facility]) [O75.643]  Triage Note Patient arrives via EMS for seizures. Per EMS, patient arrives from [place] home for seizure like activity since yesterday. When EMS arrived on scene tonight, staff stated that patient had been having seizure like activity for an hour. EMS administered 5mg  of Versed IM. When patient arrives to this ED, patient appears to be neglecting L side and not able to move L arm or leg. ED provider to bedside and taken to CT scan.    Allergies No Known Allergies  Level of Care/Admitting Diagnosis ED Disposition     ED Disposition  Admit   Condition  --   Comment  Hospital Area: [place] [[id number]]  Level of Care: Progressive [102]  Admit to Progressive based on following criteria: NEUROLOGICAL AND NEUROSURGICAL complex patients with significant risk of instability, who do not meet ICU criteria, yet require close observation or frequent assessment (< / = every 2 - 4 hours) with medical / nursing intervention.  May admit patient to [facility] or [facility] if equivalent level of care is available:: No  Covid Evaluation: Asymptomatic - no recent exposure (last 10 days) testing not required  Diagnosis: Status epilepticus [facility]) [[id number]]  Admitting Physician: Doreatha Massed  Attending Physician: Etta Quill [[id number]]  Certification:: I certify this patient will need inpatient services for at least 2 midnights  Estimated Length of Stay: 3          B Medical/Surgery History Past Medical History:  Diagnosis Date   ESRD on hemodialysis ([place])    [place]    Hypertension    PAF (paroxysmal atrial fibrillation) ([place])    Past Surgical History:  Procedure Laterality Date   ANGIOPLASTY Left 06/10/2022   Procedure: Balloon VENOPLASTY;  Surgeon: Angelia Mould, MD;  Location: [place];  Service: Vascular;  Laterality: Left;   AV FISTULA PLACEMENT Left 01/28/2022   Procedure: ARTERIOVENOUS (AV) FISTULA CREATION VS.GRAFT ARM;  Surgeon: Angelia Mould, MD;  Location: [place];  Service: Vascular;  Laterality: Left;   FISTULOGRAM Left 06/10/2022   Procedure: FISTULOGRAM;  Surgeon: Angelia Mould, MD;  Location: [place];  Service: Vascular;  Laterality: Left;   IR FLUORO GUIDE CV LINE RIGHT  01/24/2022   IR THORACENTESIS ASP PLEURAL SPACE W/IMG GUIDE  02/12/2022   IR US GUIDE VASC ACCESS RIGHT  01/24/2022     A IV Location/Drains/Wounds Patient Lines/Drains/Airways Status     Active Line/Drains/Airways     Name Placement date Placement time Site Days   Peripheral IV 07/31/22 20 G Anterior;Right Hand 07/31/22  1805  Hand  1   Fistula / Graft Left Upper arm Arteriovenous fistula 01/28/22  --  Upper arm  185   Hemodialysis Catheter Right Internal jugular Double lumen Permanent (Tunneled) 01/24/22  1527  Internal jugular  189   External Urinary Catheter 02/02/22  2204  --  180   Incision (Closed) 01/28/22 Arm Left 01/28/22  0730  -- 185   Incision (Closed) 06/10/22 Arm Left 06/10/22  0827  -- 52            Intake/Output Last 24 hours  No intake or output data in the 24 hours ending 08/01/22 1736  Labs/Imaging Results for orders placed or performed during the hospital encounter of 07/30/22 (from the past 48 hour(s))  Comprehensive metabolic panel     Status: Abnormal   Collection Time: 07/30/22 11:56 PM  Result Value Ref Range   Sodium 137 135 - 145 mmol/L   Potassium 4.9 3.5 - 5.1 mmol/L   Chloride 94 (L) 98 - 111 mmol/L   CO2 27 22 - 32 mmol/L   Glucose, Bld 90 70 - 99 mg/dL    Comment: Glucose reference range applies only to  samples taken after fasting for at least 8 hours.   BUN 21 8 - 23 mg/dL   Creatinine, Ser 9.13 (H) 0.61 - 1.24 mg/dL   Calcium 9.7 8.9 - 10.3 mg/dL   Total Protein 7.9 6.5 - 8.1 g/dL   Albumin 3.9 3.5 - 5.0 g/dL   AST 18 15 - 41 U/L   ALT 11 0 - 44 U/L   Alkaline Phosphatase 93 38 - 126 U/L   Total Bilirubin 0.5 0.3 - 1.2 mg/dL   GFR, Estimated 6 (L) >60 mL/min    Comment: (NOTE) Calculated using the CKD-EPI Creatinine Equation (2021)    Anion gap 16 (H) 5 - 15    Comment: Performed at [facility] [address] [place], [place] [address]  CBC with Differential/Platelet     Status: Abnormal   Collection Time: 07/30/22 11:56 PM  Result Value Ref Range   WBC 5.6 4.0 - 10.5 K/uL   RBC 4.27 4.22 - 5.81 MIL/uL   Hemoglobin 12.8 (L) 13.0 - 17.0 g/dL   HCT 38.5 (L) 39.0 - 52.0 %   MCV 90.2 80.0 - 100.0 fL   MCH 30.0 26.0 - 34.0 pg   MCHC 33.2 30.0 - 36.0 g/dL   RDW 17.6 (H) 11.5 - 15.5 %   Platelets 146 (L) 150 - 400 K/uL   nRBC 0.0 0.0 - 0.2 %   Neutrophils Relative % 83 %   Neutro Abs 4.7 1.7 - 7.7 K/uL   Lymphocytes Relative 10 %   Lymphs Abs 0.5 (L) 0.7 - 4.0 K/uL   Monocytes Relative 5 %   Monocytes Absolute 0.3 0.1 - 1.0 K/uL   Eosinophils Relative 0 %   Eosinophils Absolute 0.0 0.0 - 0.5 K/uL   Basophils Relative 1 %   Basophils Absolute 0.0 0.0 - 0.1 K/uL   Immature Granulocytes 1 %   Abs Immature Granulocytes 0.06 0.00 - 0.07 K/uL    Comment: Performed at [facility], [place] [address], [place], [place] [address]  Magnesium     Status: None   Collection Time: 07/30/22 11:56 PM  Result Value Ref Range   Magnesium 2.3 1.7 - 2.4 mg/dL    Comment: Performed at [facility], [place] [address], [place], [place] [address]  Phosphorus     Status: Abnormal   Collection Time: 07/30/22 11:56 PM  Result Value Ref Range   Phosphorus 5.8 (H) 2.5 - 4.6 mg/dL    Comment: Performed at [facility] [address], [place], [place] [address]  Protime-INR      Status: None   Collection Time: 07/30/22 11:56 PM  Result Value Ref Range   Prothrombin Time 14.1 11.4 - 15.2 seconds   INR 1.1 0.8 - 1.2    Comment: (NOTE) INR goal varies based on device and disease states. Performed at [facility], [place] [address], [place], [place] [address]   APTT     Status: None   Collection Time: 07/30/22 11:56 PM  Result Value Ref Range   aPTT 26 24 - 36 seconds    Comment: Performed at [facility] [address], [place], [place] [address]  I-stat chem 8, ED     Status: Abnormal   Collection Time: 07/31/22 12:02 AM  Result Value Ref Range   Sodium 134 (L) 135 - 145 mmol/L   Potassium 4.8 3.5 - 5.1 mmol/L   Chloride 98 98 - 111 mmol/L   BUN 23 8 - 23 mg/dL   Creatinine, Ser 10.40 (H) 0.61 - 1.24 mg/dL   Glucose, Bld 88 70 - 99 mg/dL    Comment: Glucose reference range applies only to samples taken after fasting for at least 8 hours.   Calcium, Ion 1.02 (L) 1.15 - 1.40 mmol/L   TCO2 27 22 - 32 mmol/L   Hemoglobin 13.9 13.0 - 17.0 g/dL   HCT 41.0 39.0 - 52.0 %  Ethanol     Status: None   Collection Time: 07/31/22 12:25 AM  Result Value Ref Range   Alcohol, Ethyl (B) <10 <10 mg/dL    Comment: (NOTE) Lowest detectable limit for serum alcohol is 10 mg/dL.  For medical purposes only. Performed at [facility], [place] [address] [place], [place] [address]   HIV Antibody (routine testing w rflx)     Status: None   Collection Time: 07/31/22  6:47 AM  Result Value Ref Range   HIV Screen 4th Generation wRfx Non Reactive Non Reactive    Comment: Performed at [facility], [place] [address], [place], [place] [address]  Hepatitis B surface antigen     Status: None   Collection Time: 07/31/22  6:09 PM  Result Value Ref Range   Hepatitis B Surface Ag NON REACTIVE NON REACTIVE    Comment: Performed at [facility] [address] [place], [place] [address]  CBC     Status: Abnormal   Collection Time: 08/01/22 10:14 AM  Result  Value Ref Range   WBC 5.1 4.0 - 10.5 K/uL   RBC 3.77 (L) 4.22 - 5.81 MIL/uL   Hemoglobin 11.2 (L) 13.0 - 17.0 g/dL   HCT 34.9 (L) 39.0 - 52.0 %   MCV 92.6 80.0 - 100.0 fL   MCH 29.7 26.0 - 34.0 pg   MCHC 32.1 30.0 - 36.0 g/dL   RDW 18.0 (H) 11.5 - 15.5 %   Platelets 158 150 - 400 K/uL   nRBC 0.0 0.0 - 0.2 %    Comment: Performed at [facility], [place] [address], [place], [place] [address]  Renal function panel     Status: Abnormal   Collection Time: 08/01/22 10:15 AM  Result Value Ref Range   Sodium 137 135 - 145 mmol/L   Potassium 5.3 (H) 3.5 - 5.1 mmol/L   Chloride 95 (L) 98 - 111 mmol/L   CO2 22 22 - 32 mmol/L   Glucose, Bld 69 (L) 70 - 99 mg/dL    Comment: Glucose reference range applies only to samples taken after fasting for at least 8 hours.   BUN 40 (H) 8 - 23 mg/dL   Creatinine, Ser 13.26 (H) 0.61 - 1.24 mg/dL   Calcium 9.4 8.9 - 10.3 mg/dL   Phosphorus 8.7 (H) 2.5 - 4.6 mg/dL   Albumin 3.3 (L) 3.5 -  5.0 g/dL   GFR, Estimated 4 (L) >60 mL/min    Comment: (NOTE) Calculated using the CKD-EPI Creatinine Equation (2021)    Anion gap 20 (H) 5 - 15    Comment: Performed at [facility], [place] [address] [place], [place] [address]   MR BRAIN WO CONTRAST  Result Date: 07/31/2022 CLINICAL DATA:  Neuro deficit, acute, stroke suspected EXAM: MRI HEAD WITHOUT CONTRAST TECHNIQUE: Multiplanar, multiecho pulse sequences of the brain and surrounding structures were obtained without intravenous contrast. COMPARISON:  CT head 07/30/2022. FINDINGS: Brain: No definite evidence of acute infarct. Small foci of DWI hyperintensity in the right parieto-occipital region are favored artifactual given areas of susceptibility artifact in this region. Multiple areas of prior hemorrhage with associated susceptibility artifact throughout the infratentorial (including brainstem and cerebellum) and supratentorial brain (including thalami, basal ganglia and cerebral hemispheres). Also, this  includes prior lobar hemorrhagic infarcts in the right basal ganglia and right parietal lobe with associated encephalomalacia. No evidence of acute hemorrhage, mass lesion or midline shift. Vascular: Major arterial flow voids are maintained the skull base. Skull and upper cervical spine: Normal marrow signal. Sinuses/Orbits: Clear sinuses.  No acute orbital findings. Other: No mastoid effusions. IMPRESSION: 1. No definite evidence of acute infarct. Small foci of DWI hyperintensity in the right parieto-occipital region are favored artifactual given areas of susceptibility artifact in this region. 2. Many prior lobar and microhemorrhages, which are detailed above and could be related to chronic hypertension and/or amyloid angiopathy. Electronically Signed   By: Margaretha Sheffield M.D.   On: 07/31/2022 13:10   EEG adult  Result Date: 07/31/2022 Alric Ran, MD     07/31/2022  9:49 AM History: 71 y.o. male past medical history of ESRD on hemodialysis, paroxysmal atrial fibrillation on Eliquis, hypertension presented via EMS from home with complaints of seizure activity. EEG classification:  asleep Description of the recording: The background rhythms of this recording consists of a fairly well modulated medium amplitude symmetric background activity . As the record starts, the patient is in the early stage II sleep during the recording, with rudimentary sleep spindles and vertex sharp wave activity seen. Photic stimulation was not performed. Hyperventilation was not performed. No epileptiform discharges seen during this recording. There was right focal slowing. Abnormality: Right focal slowing Impression: This is an abnormal EEG recording in the sleeping state due to presence of right focal slowing which is consistent with an area of neuronal dysfunction. Alric Ran, MD   DG Chest Port 1 View  Result Date: 07/30/2022 CLINICAL DATA:  Seizure EXAM: PORTABLE CHEST 1 VIEW COMPARISON:  02/12/2022 FINDINGS:  Single frontal view of the chest demonstrate stable right internal jugular dialysis catheter. The cardiac silhouette is unremarkable. Mild central vascular congestion without airspace disease, effusion, or pneumothorax. No acute bony abnormality. IMPRESSION: 1. Mild central vascular congestion.  No acute airspace disease. Electronically Signed   By: Randa Ngo M.D.   On: 07/30/2022 23:38   CT ANGIO HEAD NECK W WO CM W PERF (CODE STROKE)  Result Date: 07/30/2022 CLINICAL DATA:  Acute neurologic deficit EXAM: CT ANGIOGRAPHY HEAD AND NECK CT PERFUSION BRAIN TECHNIQUE: Multidetector CT imaging of the head and neck was performed using the standard protocol during bolus administration of intravenous contrast. Multiplanar CT image reconstructions and MIPs were obtained to evaluate the vascular anatomy. Carotid stenosis measurements (when applicable) are obtained utilizing NASCET criteria, using the distal internal carotid diameter as the denominator. Multiphase CT imaging of the brain was performed following  IV bolus contrast injection. Subsequent parametric perfusion maps were calculated using RAPID software. RADIATION DOSE REDUCTION: This exam was performed according to the departmental dose-optimization program which includes automated exposure control, adjustment of the mA and/or kV according to patient size and/or use of iterative reconstruction technique. CONTRAST:  164mL OMNIPAQUE IOHEXOL 350 MG/ML SOLN COMPARISON:  None Available. FINDINGS: CTA NECK FINDINGS SKELETON: There is no bony spinal canal stenosis. No lytic or blastic lesion. OTHER NECK: Normal pharynx, larynx and major salivary glands. No cervical lymphadenopathy. Unremarkable thyroid gland. UPPER CHEST: No pneumothorax or pleural effusion. No nodules or masses. AORTIC ARCH: There is no calcific atherosclerosis of the aortic arch. There is no aneurysm, dissection or hemodynamically significant stenosis of the visualized portion of the aorta.  Conventional 3 vessel aortic branching pattern. The visualized proximal subclavian arteries are widely patent. RIGHT CAROTID SYSTEM: Normal without aneurysm, dissection or stenosis. LEFT CAROTID SYSTEM: Normal without aneurysm, dissection or stenosis. VERTEBRAL ARTERIES: Left dominant configuration. Both origins are clearly patent. There is no dissection, occlusion or flow-limiting stenosis to the skull base (V1-V3 segments). CTA HEAD FINDINGS POSTERIOR CIRCULATION: --Vertebral arteries: Normal V4 segments. --Inferior cerebellar arteries: Normal. --Basilar artery: Normal. --Superior cerebellar arteries: Normal. --Posterior cerebral arteries (PCA): Normal. ANTERIOR CIRCULATION: --Intracranial internal carotid arteries: Normal. --Anterior cerebral arteries (ACA): Normal. Both A1 segments are present. Patent anterior communicating artery (a-comm). --Middle cerebral arteries (MCA): Normal. VENOUS SINUSES: As permitted by contrast timing, patent. ANATOMIC VARIANTS: None Review of the MIP images confirms the above findings. CT Brain Perfusion Findings: ASPECTS: 10 CBF (<30%) Volume: 51mL Perfusion (Tmax>6.0s) volume: 48mL reported, but likely artifactual given degree of motion Mismatch Volume: 56mL Infarction Location:None IMPRESSION: 1. No emergent large vessel occlusion or high-grade stenosis of the intracranial arteries. 2. No core infarct or ischemic penumbra identified by CT perfusion. Reported 4 mL mismatch is likely a motion/bolus timing artifact. Electronically Signed   By: Ulyses Jarred M.D.   On: 07/30/2022 23:35   CT HEAD CODE STROKE WO CONTRAST  Result Date: 07/30/2022 CLINICAL DATA:  Code stroke. EXAM: CT HEAD WITHOUT CONTRAST TECHNIQUE: Contiguous axial images were obtained from the base of the skull through the vertex without intravenous contrast. RADIATION DOSE REDUCTION: This exam was performed according to the departmental dose-optimization program which includes automated exposure control, adjustment  of the mA and/or kV according to patient size and/or use of iterative reconstruction technique. COMPARISON:  None Available. FINDINGS: Brain: There is no mass, hemorrhage or extra-axial collection. The size and configuration of the ventricles and extra-axial CSF spaces are normal. 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 There is periventricular hypoattenuation compatible with chronic microvascular disease. Focal encephalomalacia of the right parietal lobe, unchanged. Vascular: No abnormal hyperdensity of the major intracranial arteries or dural venous sinuses. No intracranial atherosclerosis. Skull: The visualized skull base, calvarium and extracranial soft tissues are normal. Sinuses/Orbits: No fluid levels or advanced mucosal thickening of the visualized paranasal sinuses. No mastoid or middle ear effusion. The orbits are normal. ASPECTS [facility] Stroke Program Early CT Score) - Ganglionic level infarction (caudate, lentiform nuclei, internal capsule, insula, M1-M3 cortex): 7 - Supraganglionic infarction (M4-M6 cortex): 3 Total score (0-10 with 10 being normal): 10 IMPRESSION: 1. No acute intracranial abnormality. 2. ASPECTS is 10. 3. 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 These results were communicated to Dr. Amie Portland at 11:06 pm on 07/30/2022 by text page via the [facility] messaging system. Electronically Signed   By: Cletus Gash.D.  On: 07/30/2022 23:06    Pending Labs Unresulted Labs (From admission, onward)     Start     Ordered   07/31/22 1405  Hepatitis B surface antibody,quantitative  (New Admission Hemo Labs (Hepatitis B))  Once,   R        07/31/22 1405   07/30/22 2232  Urinalysis, Routine w reflex microscopic  Once,   URGENT        07/30/22 2233   07/30/22 2231  Rapid urine drug screen (hospital performed)  Once,   STAT        07/30/22 2233            Vitals/Pain Today's Vitals   08/01/22 1535 08/01/22 1600 08/01/22 1630 08/01/22 1730  BP:  (!) 144/80 (!) 151/103 (!) 148/92 (!) 145/82  Pulse: 61 65 73 62  Resp: 17 18 20 12   Temp:      TempSrc:      SpO2: 100% 100% 100% 100%  Weight:      Height:      PainSc:        Isolation Precautions No active isolations  Medications Medications  LORazepam (ATIVAN) injection 4 mg (has no administration in time range)  levETIRAcetam (KEPPRA) IVPB 500 mg/100 mL premix (0 mg Intravenous Stopped 08/01/22 1202)  labetalol (NORMODYNE) injection 10 mg (10 mg Intravenous Given 07/31/22 0238)  acetaminophen (TYLENOL) tablet 650 mg (has no administration in time range)    Or  acetaminophen (TYLENOL) suppository 650 mg (has no administration in time range)  ondansetron (ZOFRAN) tablet 4 mg (has no administration in time range)    Or  ondansetron (ZOFRAN) injection 4 mg (has no administration in time range)  amiodarone (PACERONE) tablet 200 mg (0 mg Oral Hold 08/01/22 1018)  apixaban (ELIQUIS) tablet 5 mg (5 mg Oral Given 08/01/22 1147)  atorvastatin (LIPITOR) tablet 20 mg (20 mg Oral Not Given 07/31/22 2142)  cloNIDine (CATAPRES) tablet 0.1 mg (0.1 mg Oral Not Given 08/01/22 1006)  diltiazem (CARDIZEM CD) 24 hr capsule 180 mg (0 mg Oral Hold 08/01/22 1018)  Chlorhexidine Gluconate Cloth 2 % PADS 6 each (6 each Topical Not Given 08/01/22 0508)  heparin sodium (porcine) 1000 UNIT/ML injection (has no administration in time range)  levETIRAcetam (KEPPRA) 4,000 mg in sodium chloride 0.9 % 250 mL IVPB (0 mg Intravenous Stopped 07/31/22 0103)  iohexol (OMNIPAQUE) 350 MG/ML injection 100 mL (100 mLs Intravenous Contrast Given 07/30/22 2322)  heparin injection 2,000 Units (2,000 Units Dialysis Given 08/01/22 1529)    Mobility walks with device High fall risk   Focused Assessments Cardiac Assessment Handoff:    No results found for: "CKTOTAL", "CKMB", "CKMBINDEX", "TROPONINI" No results found for: "DDIMER" Does the Patient currently have chest pain? No   , Neuro Assessment  Handoff:  Swallow screen pass? Yes    NIH Stroke Scale ( + Modified Stroke Scale Criteria)  Interval: Shift assessment Level of Consciousness (1a.)   : Alert, keenly responsive LOC Questions (1b. )   +: Answers both questions correctly LOC Commands (1c. )   + : Performs both tasks correctly Best Gaze (2. )  +: Normal Visual (3. )  +: No visual loss Facial Palsy (4. )    : Normal symmetrical movements Motor Arm, Left (5a. )   +: Some effort against gravity Motor Arm, Right (5b. )   +: Drift Motor Leg, Left (6a. )   +: Some effort against gravity Motor Leg, Right (6b. )   +: Drift Limb  Ataxia (7. ): Absent Sensory (8. )   +: Normal, no sensory loss Best Language (9. )   +: No aphasia Dysarthria (10. ): Normal Extinction/Inattention (11.)   +: No Abnormality Modified SS Total  +: 6 Complete NIHSS TOTAL: 6 Last date known well: 07/30/22 Last time known well: 1530 Neuro Assessment: Exceptions to WDL Neuro Checks:   Initial (07/30/22 2237)  Last Documented NIHSS Modified Score: 6 (08/01/22 0300) Has TPA been given? No If patient is a Neuro Trauma and patient is going to OR before floor call report to 4N Charge nurse: [phone] or [phone]  , Renal Assessment Handoff:  Hemodialysis Schedule: Hemodialysis Schedule: Tuesday/Thursday/Saturday Last Hemodialysis date and time: 08/01/22   Restricted appendage: Left upper arm  , Pulmonary Assessment Handoff:  Lung sounds:   O2 Device: Room Air      R Recommendations: See Admitting Provider Note  Report given to:   Additional Notes:

## 2022-08-01 NOTE — Progress Notes (Signed)
[facility] PROGRESS NOTE    Progress Note  Ezrael Sam  EHU:[id number] DOB: 01-08-1951 DOA: 07/30/2022 PCP: Pcp, No     Brief Narrative:   Jorell Agne is an 71 y.o. male past medical history significant for end-stage renal disease on hemodialysis (last treatment earlier today) was noted to be off balance jerky movements on the left arm eventually had seizures per EMS for about an hour was given Versed CTA of the head showed no acute findings no LVO neurology was consulted.   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 Neurology consulted long-term EEG showed the presence of right focal abnormality Continue IV Keppra. Awaiting neurology further recommendations. Out of bed to chair consult physical therapy.  ESRD (end stage renal disease) on dialysis [facility]): Nephrology has been consulted for HD today.  Severe hypertension: Initially elevated on admission likely due to seizures Home meds have been resumed blood pressure has improved ranging around 145/80.  Paroxysmal A-fib (HCC) Resume amnio and Eliquis.   DVT prophylaxis: Eliquis Family Communication:none Status is: Inpatient Remains inpatient appropriate because: Inpatient due to seizure.    Code Status:     Code Status Orders  (From admission, onward)           Start     Ordered   07/31/22 0154  Full code  Continuous        07/31/22 0154           Code Status History     Date Active Date Inactive Code Status Order ID Comments User Context   02/02/2022 2032 02/13/2022 2355 Full Code [id number]  Collene Gobble, MD ED   01/23/2022 1730 01/31/2022 1747 Full Code [id number]  Gleason, Otilio Carpen, PA-C ED   03/11/2021 0928 03/12/2021 2355 DNR [id number]  Lacinda Axon, MD ED         IV Access:   Peripheral IV   Procedures and diagnostic studies:   MR BRAIN WO CONTRAST  Result Date:  07/31/2022 CLINICAL DATA:  Neuro deficit, acute, stroke suspected EXAM: MRI HEAD WITHOUT CONTRAST TECHNIQUE: Multiplanar, multiecho pulse sequences of the brain and surrounding structures were obtained without intravenous contrast. COMPARISON:  CT head 07/30/2022. FINDINGS: Brain: No definite evidence of acute infarct. Small foci of DWI hyperintensity in the right parieto-occipital region are favored artifactual given areas of susceptibility artifact in this region. Multiple areas of prior hemorrhage with associated susceptibility artifact throughout the infratentorial (including brainstem and cerebellum) and supratentorial brain (including thalami, basal ganglia and cerebral hemispheres). Also, this includes prior lobar hemorrhagic infarcts in the right basal ganglia and right parietal lobe with associated encephalomalacia. No evidence of acute hemorrhage, mass lesion or midline shift. Vascular: Major arterial flow voids are maintained the skull base. Skull and upper cervical spine: Normal marrow signal. Sinuses/Orbits: Clear sinuses.  No acute orbital findings. Other: No mastoid effusions. IMPRESSION: 1. No definite evidence of acute infarct. Small foci of DWI hyperintensity in the right parieto-occipital region are favored artifactual given areas of susceptibility artifact in this region. 2. Many prior lobar and microhemorrhages, which are detailed above and could be related to chronic hypertension and/or amyloid angiopathy. Electronically Signed   By: Margaretha Sheffield M.D.   On: 07/31/2022 13:10   EEG adult  Result Date: 07/31/2022 Alric Ran, MD     07/31/2022  9:49 AM History: 72 y.o. male past medical history of ESRD on hemodialysis, paroxysmal atrial fibrillation on  Eliquis, hypertension presented via EMS from home with complaints of seizure activity. EEG classification:  asleep Description of the recording: The background rhythms of this recording consists of a fairly well modulated medium  amplitude symmetric background activity . As the record starts, the patient is in the early stage II sleep during the recording, with rudimentary sleep spindles and vertex sharp wave activity seen. Photic stimulation was not performed. Hyperventilation was not performed. No epileptiform discharges seen during this recording. There was right focal slowing. Abnormality: Right focal slowing Impression: This is an abnormal EEG recording in the sleeping state due to presence of right focal slowing which is consistent with an area of neuronal dysfunction. Alric Ran, MD   DG Chest Port 1 View  Result Date: 07/30/2022 CLINICAL DATA:  Seizure EXAM: PORTABLE CHEST 1 VIEW COMPARISON:  02/12/2022 FINDINGS: Single frontal view of the chest demonstrate stable right internal jugular dialysis catheter. The cardiac silhouette is unremarkable. Mild central vascular congestion without airspace disease, effusion, or pneumothorax. No acute bony abnormality. IMPRESSION: 1. Mild central vascular congestion.  No acute airspace disease. Electronically Signed   By: Randa Ngo M.D.   On: 07/30/2022 23:38   CT ANGIO HEAD NECK W WO CM W PERF (CODE STROKE)  Result Date: 07/30/2022 CLINICAL DATA:  Acute neurologic deficit EXAM: CT ANGIOGRAPHY HEAD AND NECK CT PERFUSION BRAIN TECHNIQUE: Multidetector CT imaging of the head and neck was performed using the standard protocol during bolus administration of intravenous contrast. Multiplanar CT image reconstructions and MIPs were obtained to evaluate the vascular anatomy. Carotid stenosis measurements (when applicable) are obtained utilizing NASCET criteria, using the distal internal carotid diameter as the denominator. Multiphase CT imaging of the brain was performed following IV bolus contrast injection. Subsequent parametric perfusion maps were calculated using RAPID software. RADIATION DOSE REDUCTION: This exam was performed according to the departmental dose-optimization program  which includes automated exposure control, adjustment of the mA and/or kV according to patient size and/or use of iterative reconstruction technique. CONTRAST:  151mL OMNIPAQUE IOHEXOL 350 MG/ML SOLN COMPARISON:  None Available. FINDINGS: CTA NECK FINDINGS SKELETON: There is no bony spinal canal stenosis. No lytic or blastic lesion. OTHER NECK: Normal pharynx, larynx and major salivary glands. No cervical lymphadenopathy. Unremarkable thyroid gland. UPPER CHEST: No pneumothorax or pleural effusion. No nodules or masses. AORTIC ARCH: There is no calcific atherosclerosis of the aortic arch. There is no aneurysm, dissection or hemodynamically significant stenosis of the visualized portion of the aorta. Conventional 3 vessel aortic branching pattern. The visualized proximal subclavian arteries are widely patent. RIGHT CAROTID SYSTEM: Normal without aneurysm, dissection or stenosis. LEFT CAROTID SYSTEM: Normal without aneurysm, dissection or stenosis. VERTEBRAL ARTERIES: Left dominant configuration. Both origins are clearly patent. There is no dissection, occlusion or flow-limiting stenosis to the skull base (V1-V3 segments). CTA HEAD FINDINGS POSTERIOR CIRCULATION: --Vertebral arteries: Normal V4 segments. --Inferior cerebellar arteries: Normal. --Basilar artery: Normal. --Superior cerebellar arteries: Normal. --Posterior cerebral arteries (PCA): Normal. ANTERIOR CIRCULATION: --Intracranial internal carotid arteries: Normal. --Anterior cerebral arteries (ACA): Normal. Both A1 segments are present. Patent anterior communicating artery (a-comm). --Middle cerebral arteries (MCA): Normal. VENOUS SINUSES: As permitted by contrast timing, patent. ANATOMIC VARIANTS: None Review of the MIP images confirms the above findings. CT Brain Perfusion Findings: ASPECTS: 10 CBF (<30%) Volume: 93mL Perfusion (Tmax>6.0s) volume: 47mL reported, but likely artifactual given degree of motion Mismatch Volume: 1mL Infarction Location:None  IMPRESSION: 1. No emergent large vessel occlusion or high-grade stenosis of the intracranial arteries. 2. No core infarct  or ischemic penumbra identified by CT perfusion. Reported 4 mL mismatch is likely a motion/bolus timing artifact. Electronically Signed   By: Ulyses Jarred M.D.   On: 07/30/2022 23:35   CT HEAD CODE STROKE WO CONTRAST  Result Date: 07/30/2022 CLINICAL DATA:  Code stroke. EXAM: CT HEAD WITHOUT CONTRAST TECHNIQUE: Contiguous axial images were obtained from the base of the skull through the vertex without intravenous contrast. RADIATION DOSE REDUCTION: This exam was performed according to the departmental dose-optimization program which includes automated exposure control, adjustment of the mA and/or kV according to patient size and/or use of iterative reconstruction technique. COMPARISON:  None Available. FINDINGS: Brain: There is no mass, hemorrhage or extra-axial collection. The size and configuration of the ventricles and extra-axial CSF spaces are normal. 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 There is periventricular hypoattenuation compatible with chronic microvascular disease. Focal encephalomalacia of the right parietal lobe, unchanged. Vascular: No abnormal hyperdensity of the major intracranial arteries or dural venous sinuses. No intracranial atherosclerosis. Skull: The visualized skull base, calvarium and extracranial soft tissues are normal. Sinuses/Orbits: No fluid levels or advanced mucosal thickening of the visualized paranasal sinuses. No mastoid or middle ear effusion. The orbits are normal. ASPECTS [facility] Stroke Program Early CT Score) - Ganglionic level infarction (caudate, lentiform nuclei, internal capsule, insula, M1-M3 cortex): 7 - Supraganglionic infarction (M4-M6 cortex): 3 Total score (0-10 with 10 being normal): 10 IMPRESSION: 1. No acute intracranial abnormality. 2. ASPECTS is 10. 3. 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  These results were communicated to Dr. Amie Portland at 11:06 pm on 07/30/2022 by text page via the [facility] messaging system. Electronically Signed   By: Ulyses Jarred M.D.   On: 07/30/2022 23:06     Medical Consultants:   None.   Subjective:    Carin Primrose awake this morning relates he is hungry has no new complaints.  Objective:    Vitals:   08/01/22 0245 08/01/22 0430 08/01/22 0509 08/01/22 0630  BP: (!) 154/77 (!) 144/78  (!) 157/85  Pulse: 62 (!) 58  (!) 55  Resp: 17 15  14   Temp:   98.4 F (36.9 C)   TempSrc:   Oral   SpO2: 99% 99%  100%  Weight:      Height:       SpO2: 100 %  No intake or output data in the 24 hours ending 08/01/22 0722  Filed Weights   07/30/22 2338  Weight: 67.1 kg    Exam: General exam: In no acute distress. Respiratory system: Good air movement and clear to auscultation. Cardiovascular system: S1 & S2 heard, RRR. No JVD. Gastrointestinal system: Abdomen is nondistended, soft and nontender.  Extremities: No pedal edema. Skin: No rashes, lesions or ulcers Psychiatry: Judgement and insight appear normal. Mood & affect appropriate.  Data Reviewed:    Labs: Basic Metabolic Panel: Recent Labs  Lab 07/30/22 2356 07/31/22 0002  NA 137 134*  K 4.9 4.8  CL 94* 98  CO2 27  --   GLUCOSE 90 88  BUN 21 23  CREATININE 9.13* 10.40*  CALCIUM 9.7  --   MG 2.3  --   PHOS 5.8*  --     GFR Estimated Creatinine Clearance: 6.3 mL/min (A) (by C-G formula based on SCr of 10.4 mg/dL (H)). Liver Function Tests: Recent Labs  Lab 07/30/22 2356  AST 18  ALT 11  ALKPHOS 93  BILITOT 0.5  PROT 7.9  ALBUMIN 3.9  No results for input(s): "LIPASE", "AMYLASE" in the last 168 hours. No results for input(s): "AMMONIA" in the last 168 hours. Coagulation profile Recent Labs  Lab 07/30/22 2356  INR 1.1    COVID-19 Labs  No results for input(s): "DDIMER", "FERRITIN", "LDH", "CRP" in the last 72 hours.  Lab Results  Component Value  Date   [place] NEGATIVE 03/11/2021    CBC: Recent Labs  Lab 07/30/22 2356 07/31/22 0002  WBC 5.6  --   NEUTROABS 4.7  --   HGB 12.8* 13.9  HCT 38.5* 41.0  MCV 90.2  --   PLT 146*  --     Cardiac Enzymes: No results for input(s): "CKTOTAL", "CKMB", "CKMBINDEX", "TROPONINI" in the last 168 hours. BNP (last 3 results) No results for input(s): "PROBNP" in the last 8760 hours. CBG: No results for input(s): "GLUCAP" in the last 168 hours. D-Dimer: No results for input(s): "DDIMER" in the last 72 hours. Hgb A1c: No results for input(s): "HGBA1C" in the last 72 hours. Lipid Profile: No results for input(s): "CHOL", "HDL", "LDLCALC", "TRIG", "CHOLHDL", "LDLDIRECT" in the last 72 hours. Thyroid function studies: No results for input(s): "TSH", "T4TOTAL", "T3FREE", "THYROIDAB" in the last 72 hours.  Invalid input(s): "FREET3" Anemia work up: No results for input(s): "VITAMINB12", "FOLATE", "FERRITIN", "TIBC", "IRON", "RETICCTPCT" in the last 72 hours. Sepsis Labs: Recent Labs  Lab 07/30/22 2356  WBC 5.6    Microbiology No results found for this or any previous visit (from the past 240 hour(s)).   Medications:    amiodarone  200 mg Oral Daily   apixaban  5 mg Oral BID   atorvastatin  20 mg Oral QHS   Chlorhexidine Gluconate Cloth  6 each Topical Q0600   cloNIDine  0.1 mg Oral BID   diltiazem  180 mg Oral Daily   Continuous Infusions:  levETIRAcetam Stopped (07/31/22 2319)      LOS: 1 day   Charlynne Cousins  [facility]  08/01/2022, 7:22 AM

## 2022-08-01 NOTE — ED Notes (Signed)
Patient transported to dialysis at this time.

## 2022-08-02 DIAGNOSIS — I480 Paroxysmal atrial fibrillation: Secondary | ICD-10-CM | POA: Diagnosis not present

## 2022-08-02 DIAGNOSIS — N186 End stage renal disease: Secondary | ICD-10-CM | POA: Diagnosis not present

## 2022-08-02 DIAGNOSIS — G40901 Epilepsy, unspecified, not intractable, with status epilepticus: Secondary | ICD-10-CM | POA: Diagnosis not present

## 2022-08-02 DIAGNOSIS — D638 Anemia in other chronic diseases classified elsewhere: Secondary | ICD-10-CM

## 2022-08-02 DIAGNOSIS — I10 Essential (primary) hypertension: Secondary | ICD-10-CM | POA: Diagnosis not present

## 2022-08-02 LAB — COMPREHENSIVE METABOLIC PANEL
ALT: 10 U/L (ref 0–44)
AST: 19 U/L (ref 15–41)
Albumin: 3.9 g/dL (ref 3.5–5.0)
Alkaline Phosphatase: 95 U/L (ref 38–126)
Anion gap: 19 — ABNORMAL HIGH (ref 5–15)
BUN: 28 mg/dL — ABNORMAL HIGH (ref 8–23)
CO2: 24 mmol/L (ref 22–32)
Calcium: 10.2 mg/dL (ref 8.9–10.3)
Chloride: 93 mmol/L — ABNORMAL LOW (ref 98–111)
Creatinine, Ser: 10.01 mg/dL — ABNORMAL HIGH (ref 0.61–1.24)
GFR, Estimated: 5 mL/min — ABNORMAL LOW (ref >60)
Glucose, Bld: 84 mg/dL (ref 70–99)
Potassium: 4.8 mmol/L (ref 3.5–5.1)
Sodium: 136 mmol/L (ref 135–145)
Total Bilirubin: 0.9 mg/dL (ref 0.3–1.2)
Total Protein: 8 g/dL (ref 6.5–8.1)

## 2022-08-02 LAB — CBC WITH DIFFERENTIAL/PLATELET
Abs Immature Granulocytes: 0.03 10*3/uL (ref 0.00–0.07)
Basophils Absolute: 0.1 10*3/uL (ref 0.0–0.1)
Basophils Relative: 1 %
Eosinophils Absolute: 0.1 10*3/uL (ref 0.0–0.5)
Eosinophils Relative: 1 %
HCT: 39.4 % (ref 39.0–52.0)
Hemoglobin: 13.1 g/dL (ref 13.0–17.0)
Immature Granulocytes: 1 %
Lymphocytes Relative: 36 %
Lymphs Abs: 2 10*3/uL (ref 0.7–4.0)
MCH: 29.9 pg (ref 26.0–34.0)
MCHC: 33.2 g/dL (ref 30.0–36.0)
MCV: 90 fL (ref 80.0–100.0)
Monocytes Absolute: 0.7 10*3/uL (ref 0.1–1.0)
Monocytes Relative: 12 %
Neutro Abs: 2.7 10*3/uL (ref 1.7–7.7)
Neutrophils Relative %: 49 %
Platelets: 177 10*3/uL (ref 150–400)
RBC: 4.38 MIL/uL (ref 4.22–5.81)
RDW: 17.3 % — ABNORMAL HIGH (ref 11.5–15.5)
WBC: 5.5 10*3/uL (ref 4.0–10.5)
nRBC: 0 % (ref 0.0–0.2)

## 2022-08-02 LAB — HEPATITIS B SURFACE ANTIBODY, QUANTITATIVE: Hep B S AB Quant (Post): <3.1 m[IU]/mL — ABNORMAL LOW (ref >9.9)

## 2022-08-02 LAB — MAGNESIUM: Magnesium: 2.2 mg/dL (ref 1.7–2.4)

## 2022-08-02 LAB — PHOSPHORUS: Phosphorus: 6.8 mg/dL — ABNORMAL HIGH (ref 2.5–4.6)

## 2022-08-02 MED ORDER — LEVETIRACETAM 500 MG PO TABS
500.0000 mg | ORAL_TABLET | Freq: Two times a day (BID) | ORAL | Status: DC
  Administered 2022-08-02 – 2022-08-03 (×2): 500 mg via ORAL
  Filled 2022-08-02 (×4): qty 1

## 2022-08-02 MED ORDER — CHLORHEXIDINE GLUCONATE CLOTH 2 % EX PADS
6.0000 | MEDICATED_PAD | Freq: Every day | CUTANEOUS | Status: DC
  Administered 2022-08-03: 6 via TOPICAL

## 2022-08-02 MED ORDER — NEPRO/CARBSTEADY PO LIQD
237.0000 mL | Freq: Two times a day (BID) | ORAL | Status: DC
  Administered 2022-08-02 – 2022-08-03 (×2): 237 mL via ORAL

## 2022-08-02 MED ORDER — RENA-VITE PO TABS
1.0000 | ORAL_TABLET | Freq: Every day | ORAL | Status: DC
  Administered 2022-08-02: 1 via ORAL
  Filled 2022-08-02: qty 1

## 2022-08-02 NOTE — TOC Initial Note (Signed)
Transition of Care [facility]) - Initial/Assessment Note    Patient Details  Name: Tanner Rollins MRN: [id number] Date of Birth: [birth year]  [facility] CM/SW Contact:    Pollie Friar, RN Phone Number: 08/02/2022, 4:15 PM  Clinical Narrative:                 Pt lives home alone but his sister says he can stay with him 24/7 and assist as needed. He has a walker at home.  Pt was driving self to HD prior to admission but wont be able to after d/c. CM spoke with him and he has used [facility] for transportation in the past. CM called [facility] and got transportation arranged for next week starting Tuesday for HD. He will have to call next week for the following week. Information on the AVS.  Home health arranged with [facility]. Pt has used them in the past and would like to use them again. Information on AVS.  Will need to call sister tomorrow about transport home.    Expected Discharge Plan: [place] Barriers to Discharge: Continued Medical Work up   Patient Goals and CMS Choice   CMS Medicare.gov Compare Post Acute Care list provided to:: Patient Choice offered to / list presented to : Patient  Expected Discharge Plan and Services Expected Discharge Plan: [place]   Discharge Planning Services: CM Consult Post Acute Care Choice: [place] arrangements for the past 2 months: Apartment                           HH Arranged: PT, OT [place] Agency: [place] ([facility]) Date HH Agency Contacted: 08/02/22   Representative spoke with at [place]: Caryl Pina  Prior Living Arrangements/Services Living arrangements for the past 2 months: Apartment Lives with:: Self Patient language and need for interpreter reviewed:: Yes Do you feel safe going back to the place where you live?: Yes      Need for Family Participation in Patient Care: Yes (Comment) Care giver support system in place?: Yes (comment) Current home services: DME  (walker) Criminal Activity/Legal Involvement Pertinent to Current Situation/Hospitalization: No - Comment as needed  Activities of Daily Living Home Assistive Devices/Equipment: Environmental consultant (specify type), Cane (specify quad or straight), Wheelchair, Blood pressure cuff, Eyeglasses, CBG Meter, Reacher, Long-handled sponge, Grab bars around toilet, Shower chair with back ADL Screening (condition at time of admission) Patient's cognitive ability adequate to safely complete daily activities?: Yes Is the patient deaf or have difficulty hearing?: No Does the patient have difficulty seeing, even when wearing glasses/contacts?: No (readingglasses) Does the patient have difficulty concentrating, remembering, or making decisions?: No Patient able to express need for assistance with ADLs?: Yes Does the patient have difficulty dressing or bathing?: Yes Independently performs ADLs?: No Communication: Independent Dressing (OT): Needs assistance Is this a change from baseline?: Pre-admission baseline Grooming: Needs assistance Is this a change from baseline?: Pre-admission baseline Feeding: Independent Bathing: Needs assistance Is this a change from baseline?: Pre-admission baseline Toileting: Independent with device (comment) Is this a change from baseline?: Pre-admission baseline In/Out Bed: Needs assistance Is this a change from baseline?: Pre-admission baseline Walks in Home: Independent with device (comment) Does the patient have difficulty walking or climbing stairs?: No Weakness of Legs: Left Weakness of Arms/Hands: Both  Permission Sought/Granted                  Emotional Assessment  Appearance:: Appears stated age Attitude/Demeanor/Rapport: Engaged Affect (typically observed): Accepting Orientation: : Oriented to Self, Oriented to Place, Oriented to  Time, Oriented to Situation   Psych Involvement: No (comment)  Admission diagnosis:  Status epilepticus ([place]) [G40.901] Seizure  ([place]) [R56.9] Left hemiparesis ([place]) [G81.94] Patient Active Problem List   Diagnosis Date Noted   Status epilepticus ([place]) 07/31/2022   Acute respiratory failure with hypoxia ([place]) 02/08/2022   Protein-calorie malnutrition, severe 02/04/2022   Shock ([place]) 02/02/2022   ESRD (end stage renal disease) on dialysis ([place]) 02/02/2022   A-fib ([place]) 16/38/4536   Metabolic bone disease 46/80/3212   Pleural effusion 02/02/2022   Lethargy 02/02/2022   High anion gap metabolic acidosis    Hypertensive emergency 01/23/2022   Chest pain    AKI (acute kidney injury) ([place])    CKD (chronic kidney disease) stage 4, GFR 15-29 ml/min ([place]) 03/12/2021   Enlarged prostate without urinary obstruction 03/12/2021   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 [place] [address] Phone: [phone] Fax: [phone]     Social Determinants of Health (SDOH) Interventions    Readmission Risk Interventions     No data to display

## 2022-08-02 NOTE — Progress Notes (Signed)
Tanner Rollins Progress Note  Subjective: seen in ED, no /co's.   Vitals:   08/02/22 0003 08/02/22 0306 08/02/22 0903 08/02/22 1122  BP: (!) 148/87 (!) 144/88 (!) 166/107 (!) 148/91  Pulse: 65 69 79 73  Resp: 16 16  18   Temp: 98.1 F (36.7 C) 97.9 F (36.6 C) 98 F (36.7 C) 98.2 F (36.8 C)  TempSrc: Oral Oral Oral   SpO2: 100% 100% 100% 100%  Weight:      Height:        Exam: Gen alert, no distress No jvd or bruits Chest clear bilat to bases RRR no MRG Abd soft ntnd no mass or ascites +bs Ext no LE edema Neuro is alert, Ox 3 , nf    RIJ TDC in place      Home meds include - amiodarone, eliquis, lipitor, catapres 0.1 bid, cardizem 180 qd, prns        OP HD: [place] TTS 4h  400/800   66kg  2/2.25 bath   TDC   Heparin 2000 - last HD 12/12, post wt 69.1kg, coming off 2kg or more + - doxercalciferol 4 ug IV tiw - no esa, last Hb 11.5 on 12/12     Assessment/ Plan: Status epilepticus - per pmd/ neurology.  ESRD - on HD TTS. No missed HD. HD Sat.  HD access - has TDC and AVF. OP unit tried 16ga needles and it didn't work so they are using 17ga needles.  HTN/vol - BP's controlled on clonidine/ cardizem. At dry wt, Goal SBP < 160. UFG 1L next HD Atrial fib - on po amio and cardizem MBD ckd - CCa and phos in range. Cont IV vdra.  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 Follow.     Tanner Rollins 08/02/2022, 4:04 PM   Recent Labs  Lab 08/01/22 1014 08/01/22 1015 08/02/22 0854  HGB 11.2*  --  13.1  ALBUMIN  --  3.3* 3.9  CALCIUM  --  9.4 10.2  PHOS  --  8.7* 6.8*  CREATININE  --  13.26* 10.01*  K  --  5.3* 4.8    No results for input(s): "IRON", "TIBC", "FERRITIN" in the last 168 hours. Inpatient medications:  amiodarone  200 mg Oral Daily   apixaban  5 mg Oral BID   atorvastatin  20 mg Oral QHS   Chlorhexidine Gluconate Cloth  6 each Topical Q0600   cloNIDine  0.1 mg Oral BID   diltiazem  180 mg Oral Daily   feeding supplement (NEPRO CARB  STEADY)  237 mL Oral BID BM   levETIRAcetam  500 mg Oral BID   multivitamin  1 tablet Oral QHS     acetaminophen **OR** acetaminophen, labetalol, LORazepam, ondansetron **OR** ondansetron (ZOFRAN) IV

## 2022-08-02 NOTE — Evaluation (Signed)
Physical Therapy Evaluation Patient Details Name: Tanner Rollins MRN: [id number] DOB: [birth year] Today's Date: 08/02/2022  History of Present Illness  71 yo male presents to [facility] on 12/12 with seizures, L weakness and neglect. PMH: ESRD TTS, HTN, Afib with RVR, prior respiratory failure, metabolic bone disease.  Clinical Impression   Pt presents with generalized weakness, impaired balance at high risk for falls, impaired safety awareness/insight into deficits with heavy list to the right dynamically, impaired gait, and decreased activity tolerance. Pt to benefit from acute PT to address deficits. Pt ambulated short hallway distance with use of RW and light steadying and guiding assist as pt listing right in hallway and in the R side of his RW with no awareness even to the point of bumping into objects on the right. Pt states he can have increased support from his sister at d/c, not yet confirmed so recommendation listed below. PT to progress mobility as tolerated, and will continue to follow acutely.         Recommendations for follow up therapy are one component of a multi-disciplinary discharge planning process, led by the attending physician.  Recommendations may be updated based on patient status, additional functional criteria and insurance authorization.  Follow Up Recommendations Skilled nursing-short term rehab (<3 hours/day) (vs home with HHPT and increased support from his sister who is his caregiver, family not present to verify able to provide increased supervision) Can patient physically be transported by private vehicle: Yes    Assistance Recommended at Discharge Frequent or constant Supervision/Assistance  Patient can return home with the following  A little help with walking and/or transfers;A little help with bathing/dressing/bathroom;Assistance with cooking/housework;Assist for transportation    Equipment Recommendations None recommended by PT  Recommendations for Other  Services       Functional Status Assessment Patient has had a recent decline in their functional status and demonstrates the ability to make significant improvements in function in a reasonable and predictable amount of time.     Precautions / Restrictions Precautions Precautions: Fall Restrictions Weight Bearing Restrictions: No      Mobility  Bed Mobility Overal bed mobility: Needs Assistance Bed Mobility: Supine to Sit     Supine to sit: HOB elevated, Supervision     General bed mobility comments: increased time    Transfers Overall transfer level: Needs assistance Equipment used: Rolling walker (2 wheels) Transfers: Sit to/from Stand Sit to Stand: Min assist           General transfer comment: assist for steadying upon standing, pt with posterior bias requiring return to sit after first stand. STS x2.    Ambulation/Gait Ambulation/Gait assistance: Min assist Gait Distance (Feet): 80 Feet Assistive device: Rolling walker (2 wheels) Gait Pattern/deviations: Step-through pattern, Decreased stride length, Trunk flexed Gait velocity: decr     General Gait Details: cues for upright posture, assist to steady and guide pt/RW to middle of hallway as pt listing R multiple times  Stairs            Wheelchair Mobility    Modified Rankin (Stroke Patients Only)       Balance Overall balance assessment: Needs assistance Sitting-balance support: No upper extremity supported, Feet supported Sitting balance-Leahy Scale: Fair     Standing balance support: Bilateral upper extremity supported, During functional activity Standing balance-Leahy Scale: Fair Standing balance comment: able to stand statically without UE Support, requires support dynamically  Pertinent Vitals/Pain Pain Assessment Pain Assessment: No/denies pain    Home Living Family/patient expects to be discharged to:: Private residence (anointed  acres) Living Arrangements: Alone Available Help at Discharge: Family;Available PRN/intermittently (sister comes every day to help with "whatever I need") Type of Home: Apartment Home Access: Elevator       Home Layout: One level Home Equipment: Conservation officer, nature (2 wheels);Cane - single point;Wheelchair - manual      Prior Function Prior Level of Function : Needs assist;Driving             Mobility Comments: pt reports using RW or w/c in the apt, uses w/c and scat ADLs Comments: pt's sister helps with washing up, dressing, cooks and grocery shops     Hand Dominance   Dominant Hand: Right    Extremity/Trunk Assessment   Upper Extremity Assessment Upper Extremity Assessment: Defer to OT evaluation    Lower Extremity Assessment Lower Extremity Assessment: Generalized weakness    Cervical / Trunk Assessment Cervical / Trunk Assessment: Normal  Communication   Communication: No difficulties  Cognition Arousal/Alertness: Awake/alert Behavior During Therapy: Flat affect Overall Cognitive Status: No family/caregiver present to determine baseline cognitive functioning                                 General Comments: A&Ox4, some moments of increased processing time and lacks insight into current deficits        General Comments      Exercises     Assessment/Plan    PT Assessment Patient needs continued PT services  PT Problem List Decreased strength;Decreased coordination;Decreased activity tolerance;Decreased balance;Decreased knowledge of use of DME;Decreased mobility;Decreased safety awareness;Decreased knowledge of precautions       PT Treatment Interventions DME instruction;Therapeutic activities;Gait training;Therapeutic exercise;Patient/family education;Balance training;Neuromuscular re-education;Functional mobility training    PT Goals (Current goals can be found in the Care Plan section)  Acute Rehab PT Goals Patient Stated Goal: home PT  Goal Formulation: With patient Time For Goal Achievement: 08/16/22 Potential to Achieve Goals: Good    Frequency Min 3X/week     Co-evaluation               AM-PAC PT "6 Clicks" Mobility  Outcome Measure Help needed turning from your back to your side while in a flat bed without using bedrails?: A Little Help needed moving from lying on your back to sitting on the side of a flat bed without using bedrails?: A Little Help needed moving to and from a bed to a chair (including a wheelchair)?: A Little Help needed standing up from a chair using your arms (e.g., wheelchair or bedside chair)?: A Little Help needed to walk in hospital room?: A Little Help needed climbing 3-5 steps with a railing? : A Lot 6 Click Score: 17    End of Session   Activity Tolerance: Patient limited by fatigue Patient left: in chair;with call bell/phone within reach;with chair alarm set Nurse Communication: Mobility status PT Visit Diagnosis: Other abnormalities of gait and mobility (R26.89);Muscle weakness (generalized) (M62.81)    Time: 6387-5643 PT Time Calculation (min) (ACUTE ONLY): 13 min   Charges:   PT Evaluation $PT Eval Low Complexity: 1 Low         Tremar Wickens S, PT DPT Acute Rehabilitation Services Pager [phone]  Office [phone]   [place] E Ruffin Pyo 08/02/2022, 10:16 AM

## 2022-08-02 NOTE — Progress Notes (Signed)
Initial Nutrition Assessment  DOCUMENTATION CODES:  Severe malnutrition in context of chronic illness  INTERVENTION:  Continue regular diet as ordered Nepro Shake po BID, each supplement provides 425 kcal and 19 grams protein Renavite daily  NUTRITION DIAGNOSIS:  Severe Malnutrition related to chronic illness (ESRD on HD) as evidenced by severe fat depletion, severe muscle depletion.  GOAL:  Patient will meet greater than or equal to 90% of their needs  MONITOR:  PO intake, Supplement acceptance, Labs, Weight trends, I & O's  REASON FOR ASSESSMENT:  Malnutrition Screening Tool    ASSESSMENT:  Pt with hx of HTN, atrial fibrillation, and ESRD on HD presented to ED after having a seizure after HD.  Pt resting in bed at the time of assessment. Reports feeling well today and thinks that he is getting discharged today. Discussed recent intake. Pt reports a good appetite but does state that he does not eat breakfast on days he has HD.   Significant deficits noted to muscle and fat stores. Pt reports that he had "an accident" about a year ago and lost 40 lbs. A little of the weight has been regained, but that he is still down from his previous weight of about 181 lbs.   Outpatient HD:  TTS schedule, access: [facility] EDW: 66kg Last HD 12/14, 2.4L removed  NUTRITION - FOCUSED PHYSICAL EXAM: Flowsheet Row Most Recent Value  Orbital Region Moderate depletion  Upper Arm Region Severe depletion  Thoracic and Lumbar Region Severe depletion  Buccal Region Moderate depletion  Temple Region Moderate depletion  Clavicle Bone Region Severe depletion  Clavicle and Acromion Bone Region Severe depletion  Scapular Bone Region Moderate depletion  Dorsal Hand Severe depletion  Patellar Region Severe depletion  Anterior Thigh Region Severe depletion  Posterior Calf Region Severe depletion  Edema (RD Assessment) None  Hair Reviewed  Eyes Reviewed  Mouth Reviewed  [looks dry]  Skin Reviewed   Nails Reviewed    Diet Order:   Diet Order             Diet renal with fluid restriction Fluid restriction: 1200 mL Fluid; Room service appropriate? Yes; Fluid consistency: Thin  Diet effective now                   EDUCATION NEEDS:  Education needs have been addressed  Skin:  Skin Assessment: Reviewed RN Assessment  Last BM:  12/12  Height:  Ht Readings from Last 1 Encounters:  08/01/22 5\' 11"  (1.803 m)    Weight:  Wt Readings from Last 1 Encounters:  08/01/22 65.5 kg    Ideal Body Weight:  78.2 kg  BMI:  Body mass index is 20.14 kg/m.  Estimated Nutritional Needs:  Kcal:  2000-2200 kcal/d Protein:  100-120g/d Fluid:  1L+UOP    Ranell Patrick, RD, LDN Clinical Dietitian RD pager # available in AMION  After hours/weekend pager # available in [facility]

## 2022-08-02 NOTE — Progress Notes (Signed)
PROGRESS NOTE    Tanner Rollins  GUY:[id number] DOB: 08/10/1951 DOA: 07/30/2022 PCP: Pcp, No   Brief Narrative:  The patient is a 71 year old African-American male with a past medical history significant for but limited to end-stage renal disease on hemodialysis as well as other comorbidities who was noted to be off balance and had direct movements on the left arm eventually had seizures.  EMS was contacted and per them he was seizing for about an hour and had to be given Versed.  CTA of the head was done and showed no acute findings.  No LVO.  Neurology was consulted and patient was loaded with Keppra and is improved.  He is back to his baseline and improving slowly.  He underwent dialysis yesterday.  PT OT evaluated and recommending SNF however he has increased support from his sister and likely will go home with her.   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 -Neurology consulted long-term EEG showed the presence of right focal abnormality Continue IV Keppra and changed to p.o. Awaiting neurology further recommendations but they are recommending maintaining seizure precautions PT OT and resuming Eliquis -Driving precautions have been given. Out of bed to chair consult physical therapy. -PT OT recommending SNF but patient wants to go home with home health and he does have 24-hour care and assistance given with his sister however patient was still driving to HD and cannot drive for 6 months at least a minimum given his seizure   ESRD (end stage renal disease) on dialysis [facility]): Hyperphosphatemia Hyperkalemia Nephrology has been consulted for HD and he was taken yesterday and will be going tomorrow -Patient's K+ is resolved -Phos Level went from 8.7 -> 6.8 -Patient's BUN/Cr went from 40/13.26 -> 28/10.01 -After dialysis and after transportation arrangements have been made for  patient's hemodialysis he can be safely discharged   Severe Hypertension: -Initially elevated on admission likely due to seizures Home meds have been resumed blood pressure has improved ranging around 145/80.   Paroxysmal A-fib (HCC) Resume amnio and Eliquis.  Normocytic Anemia/Anemia of Chronic Disease -Patient's Hgb/Hct went from 12.8/38.5 -> 11.2/34.9 -> 13.1/39.4 -Check Anemia Panel in the AM  -Continue to Monitor for S/Sx of Bleeding; No overt bleeding noted -Repeat CBC in the AM   DVT prophylaxis: SCDs Start: 07/31/22 0154 apixaban (ELIQUIS) tablet 5 mg    Code Status: Full Code Family Communication: No family present at beside  Disposition Plan:  Level of care: Progressive Status is: Inpatient Remains inpatient appropriate because: Anticipating discharging next 24 to 48 hours   Consultants:  Neurology Nephrology   Procedures:  EEG Impression: This is an abnormal EEG recording in the sleeping state due to presence of right focal slowing which is consistent with an area of neuronal dysfunction.   Antimicrobials:  Anti-infectives (From admission, onward)    None       Subjective: Seen and examined at bedside and he was doing well. Denied any CP or SOB. No lightheadedness or dizziness. No other concerns or complaints at this time.   Objective: Vitals:   08/02/22 0306 08/02/22 0903 08/02/22 1122 08/02/22 1609  BP: (!) 144/88 (!) 166/107 (!) 148/91 (!) 133/91  Pulse: 69 79 73 (!) 57  Resp: 16  18 16   Temp: 97.9 F (36.6 C) 98 F (36.7 C) 98.2 F (36.8 C) (!) 97.5 F (36.4 C)  TempSrc: Oral Oral  Oral  SpO2: 100%  100% 100% 100%  Weight:      Height:        Intake/Output Summary (Last 24 hours) at 08/02/2022 1803 Last data filed at 08/01/2022 2256 Gross per 24 hour  Intake 561.72 ml  Output --  Net 561.72 ml   Filed Weights   07/30/22 2338 08/01/22 1938  Weight: 67.1 kg 65.5 kg   Examination: Physical Exam:  Constitutional: Thin AAM in  NAD Respiratory: Diminished to auscultation bilaterally, no wheezing, rales, rhonchi or crackles. Normal respiratory effort and patient is not tachypenic. No accessory muscle use. Unlabored breathing Cardiovascular: RRR, no murmurs / rubs / gallops. S1 and S2 auscultated. No extremity edema.  Abdomen: Soft, non-tender, non-distended. Bowel sounds positive.  GU: Deferred. Musculoskeletal: No clubbing / cyanosis of digits/nails. No joint deformity upper and lower extremities.  Skin: No rashes, lesions, ulcers on a limited skin evaluation. No induration; Warm and dry.  Neurologic: CN 2-12 grossly intact with no focal deficits. Romberg sign and cerebellar reflexes not assessed.  Psychiatric: Normal judgment and insight. Alert and oriented x 3. Normal mood and appropriate affect.   Data Reviewed: I have personally reviewed following labs and imaging studies  CBC: Recent Labs  Lab 07/30/22 2356 07/31/22 0002 08/01/22 1014 08/02/22 0854  WBC 5.6  --  5.1 5.5  NEUTROABS 4.7  --   --  2.7  HGB 12.8* 13.9 11.2* 13.1  HCT 38.5* 41.0 34.9* 39.4  MCV 90.2  --  92.6 90.0  PLT 146*  --  158 161   Basic Metabolic Panel: Recent Labs  Lab 07/30/22 2356 07/31/22 0002 08/01/22 1015 08/02/22 0854  NA 137 134* 137 136  K 4.9 4.8 5.3* 4.8  CL 94* 98 95* 93*  CO2 27  --  22 24  GLUCOSE 90 88 69* 84  BUN 21 23 40* 28*  CREATININE 9.13* 10.40* 13.26* 10.01*  CALCIUM 9.7  --  9.4 10.2  MG 2.3  --   --  2.2  PHOS 5.8*  --  8.7* 6.8*   GFR: Estimated Creatinine Clearance: 6.4 mL/min (A) (by C-G formula based on SCr of 10.01 mg/dL (H)). Liver Function Tests: Recent Labs  Lab 07/30/22 2356 08/01/22 1015 08/02/22 0854  AST 18  --  19  ALT 11  --  10  ALKPHOS 93  --  95  BILITOT 0.5  --  0.9  PROT 7.9  --  8.0  ALBUMIN 3.9 3.3* 3.9   No results for input(s): "LIPASE", "AMYLASE" in the last 168 hours. No results for input(s): "AMMONIA" in the last 168 hours. Coagulation Profile: Recent  Labs  Lab 07/30/22 2356  INR 1.1   Cardiac Enzymes: No results for input(s): "CKTOTAL", "CKMB", "CKMBINDEX", "TROPONINI" in the last 168 hours. BNP (last 3 results) No results for input(s): "PROBNP" in the last 8760 hours. HbA1C: No results for input(s): "HGBA1C" in the last 72 hours. CBG: No results for input(s): "GLUCAP" in the last 168 hours. Lipid Profile: No results for input(s): "CHOL", "HDL", "LDLCALC", "TRIG", "CHOLHDL", "LDLDIRECT" in the last 72 hours. Thyroid Function Tests: No results for input(s): "TSH", "T4TOTAL", "FREET4", "T3FREE", "THYROIDAB" in the last 72 hours. Anemia Panel: No results for input(s): "VITAMINB12", "FOLATE", "FERRITIN", "TIBC", "IRON", "RETICCTPCT" in the last 72 hours. Sepsis Labs: No results for input(s): "PROCALCITON", "LATICACIDVEN" in the last 168 hours.  No results found for this or any previous visit (from the past 240 hour(s)).   Radiology Studies: No results found.  Scheduled Meds:  amiodarone  200 mg Oral Daily   apixaban  5 mg Oral BID   atorvastatin  20 mg Oral QHS   Chlorhexidine Gluconate Cloth  6 each Topical Q0600   [START ON 08/03/2022] Chlorhexidine Gluconate Cloth  6 each Topical Q0600   cloNIDine  0.1 mg Oral BID   diltiazem  180 mg Oral Daily   feeding supplement (NEPRO CARB STEADY)  237 mL Oral BID BM   levETIRAcetam  500 mg Oral BID   multivitamin  1 tablet Oral QHS   Continuous Infusions:   LOS: 2 days   Raiford Noble, DO [facility] Available via Epic secure chat 7am-7pm After these hours, please refer to coverage provider listed on [email or web address] 08/02/2022, 6:03 PM

## 2022-08-03 DIAGNOSIS — G40901 Epilepsy, unspecified, not intractable, with status epilepticus: Secondary | ICD-10-CM | POA: Diagnosis not present

## 2022-08-03 DIAGNOSIS — N186 End stage renal disease: Secondary | ICD-10-CM | POA: Diagnosis not present

## 2022-08-03 DIAGNOSIS — I10 Essential (primary) hypertension: Secondary | ICD-10-CM | POA: Diagnosis not present

## 2022-08-03 DIAGNOSIS — Z992 Dependence on renal dialysis: Secondary | ICD-10-CM | POA: Diagnosis not present

## 2022-08-03 LAB — CBC WITH DIFFERENTIAL/PLATELET
Abs Immature Granulocytes: 0.01 10*3/uL (ref 0.00–0.07)
Basophils Absolute: 0 10*3/uL (ref 0.0–0.1)
Basophils Relative: 1 %
Eosinophils Absolute: 0.1 10*3/uL (ref 0.0–0.5)
Eosinophils Relative: 3 %
HCT: 37.5 % — ABNORMAL LOW (ref 39.0–52.0)
Hemoglobin: 12.2 g/dL — ABNORMAL LOW (ref 13.0–17.0)
Immature Granulocytes: 0 %
Lymphocytes Relative: 39 %
Lymphs Abs: 2 10*3/uL (ref 0.7–4.0)
MCH: 29.5 pg (ref 26.0–34.0)
MCHC: 32.5 g/dL (ref 30.0–36.0)
MCV: 90.8 fL (ref 80.0–100.0)
Monocytes Absolute: 0.7 10*3/uL (ref 0.1–1.0)
Monocytes Relative: 14 %
Neutro Abs: 2.2 10*3/uL (ref 1.7–7.7)
Neutrophils Relative %: 43 %
Platelets: 168 10*3/uL (ref 150–400)
RBC: 4.13 MIL/uL — ABNORMAL LOW (ref 4.22–5.81)
RDW: 17.4 % — ABNORMAL HIGH (ref 11.5–15.5)
WBC: 5 10*3/uL (ref 4.0–10.5)
nRBC: 0 % (ref 0.0–0.2)

## 2022-08-03 LAB — RETICULOCYTES
Immature Retic Fract: 6.5 % (ref 2.3–15.9)
RBC.: 4.08 MIL/uL — ABNORMAL LOW (ref 4.22–5.81)
Retic Count, Absolute: 37.1 10*3/uL (ref 19.0–186.0)
Retic Ct Pct: 0.9 % (ref 0.4–3.1)

## 2022-08-03 LAB — COMPREHENSIVE METABOLIC PANEL
ALT: 9 U/L (ref 0–44)
AST: 16 U/L (ref 15–41)
Albumin: 3.8 g/dL (ref 3.5–5.0)
Alkaline Phosphatase: 75 U/L (ref 38–126)
Anion gap: 17 — ABNORMAL HIGH (ref 5–15)
BUN: 38 mg/dL — ABNORMAL HIGH (ref 8–23)
CO2: 25 mmol/L (ref 22–32)
Calcium: 10.2 mg/dL (ref 8.9–10.3)
Chloride: 91 mmol/L — ABNORMAL LOW (ref 98–111)
Creatinine, Ser: 12.33 mg/dL — ABNORMAL HIGH (ref 0.61–1.24)
GFR, Estimated: 4 mL/min — ABNORMAL LOW (ref >60)
Glucose, Bld: 90 mg/dL (ref 70–99)
Potassium: 4.7 mmol/L (ref 3.5–5.1)
Sodium: 133 mmol/L — ABNORMAL LOW (ref 135–145)
Total Bilirubin: 0.5 mg/dL (ref 0.3–1.2)
Total Protein: 7.4 g/dL (ref 6.5–8.1)

## 2022-08-03 LAB — IRON AND TIBC
Iron: 83 ug/dL (ref 45–182)
Saturation Ratios: 37 % (ref 17.9–39.5)
TIBC: 223 ug/dL — ABNORMAL LOW (ref 250–450)
UIBC: 140 ug/dL

## 2022-08-03 LAB — MAGNESIUM: Magnesium: 2.3 mg/dL (ref 1.7–2.4)

## 2022-08-03 LAB — FOLATE: Folate: 28.4 ng/mL (ref >5.9)

## 2022-08-03 LAB — FERRITIN: Ferritin: 1024 ng/mL — ABNORMAL HIGH (ref 24–336)

## 2022-08-03 LAB — PHOSPHORUS: Phosphorus: 8.2 mg/dL — ABNORMAL HIGH (ref 2.5–4.6)

## 2022-08-03 LAB — VITAMIN B12: Vitamin B-12: 1016 pg/mL — ABNORMAL HIGH (ref 180–914)

## 2022-08-03 MED ORDER — HEPARIN SODIUM (PORCINE) 1000 UNIT/ML IJ SOLN
2000.0000 [IU] | Freq: Once | INTRAMUSCULAR | Status: AC
  Administered 2022-08-03: 2000 [IU] via INTRAVENOUS
  Filled 2022-08-03 (×2): qty 2

## 2022-08-03 MED ORDER — LEVETIRACETAM 500 MG PO TABS: 500.0000 mg | ORAL_TABLET | Freq: Two times a day (BID) | ORAL | 0 refills | Status: DC

## 2022-08-03 MED ORDER — HEPARIN SODIUM (PORCINE) 1000 UNIT/ML IJ SOLN
INTRAMUSCULAR | Status: AC
  Administered 2022-08-03: 3800 [IU]
  Filled 2022-08-03: qty 4

## 2022-08-03 MED ORDER — ORAL CARE MOUTH RINSE: 15.0000 mL | OROMUCOSAL | Status: DC | PRN

## 2022-08-03 MED ORDER — ONDANSETRON HCL 4 MG PO TABS: 4.0000 mg | ORAL_TABLET | Freq: Four times a day (QID) | ORAL | 0 refills | Status: DC | PRN

## 2022-08-03 NOTE — Progress Notes (Signed)
Tanner Rollins Progress Note  Subjective: seen in room, no c/o. Going home.   Vitals:   08/03/22 0950 08/03/22 1030 08/03/22 1100 08/03/22 1130  BP: (!) 144/88 (!) 133/99 (!) 141/85 (!) 149/89  Pulse: (!) 53 (!) 27 66 68  Resp: 14 13 16 16   Temp:      TempSrc:      SpO2: 99% 100% 100% 99%  Weight: 67.6 kg     Height:        Exam: Gen alert, no distress No jvd or bruits Chest clear bilat to bases RRR no MRG Abd soft ntnd no mass or ascites +bs Ext no LE edema Neuro is alert, Ox 3 , nf    RIJ TDC in place      Home meds include - amiodarone, eliquis, lipitor, catapres 0.1 bid, cardizem 180 qd, prns        OP HD: [place] TTS 4h  400/800   66kg  2/2.25 bath   TDC   Heparin 2000 - last HD 12/12, post wt 69.1kg, coming off 2kg or more + - doxercalciferol 4 ug IV tiw - no esa, last Hb 11.5 on 12/12     Assessment/ Plan: Status epilepticus - w/ L hemiparesis. MRI essentially neg, started on Keppra, now po. EEG showed R sided focal changes. PT rec'd SNF but pt wants to go home w/ home health. Transportation to HD is an issue though, since can't drive now for 6 mos after seizures. Have d/w pmd, SW working on this.  ESRD - on HD TTS. No missed HD. HD here today.  HD access - has TDC and AVF. OP unit tried 16ga needles and it didn't work so they are using 17ga needles.  HTN/vol - BP's uncont on admission, now controlled on clonidine/ cardizem. 2kg up today, UF goal the same. .  Atrial fib - on po amio and cardizem MBD ckd - CCa and phos in range. Cont IV vdra.  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 Follow.     Tanner Rollins 08/03/2022, 11:35 AM   Recent Labs  Lab 08/02/22 0854 08/03/22 0347  HGB 13.1 12.2*  ALBUMIN 3.9 3.8  CALCIUM 10.2 10.2  PHOS 6.8* 8.2*  CREATININE 10.01* 12.33*  K 4.8 4.7    Recent Labs  Lab 08/03/22 0347  IRON 83  TIBC 223*  FERRITIN 1,024*   Inpatient medications:  heparin sodium (porcine)       amiodarone  200 mg Oral  Daily   apixaban  5 mg Oral BID   atorvastatin  20 mg Oral QHS   Chlorhexidine Gluconate Cloth  6 each Topical Q0600   cloNIDine  0.1 mg Oral BID   diltiazem  180 mg Oral Daily   feeding supplement (NEPRO CARB STEADY)  237 mL Oral BID BM   levETIRAcetam  500 mg Oral BID   multivitamin  1 tablet Oral QHS     heparin sodium (porcine), acetaminophen **OR** acetaminophen, labetalol, LORazepam, ondansetron **OR** ondansetron (ZOFRAN) IV, mouth rinse

## 2022-08-03 NOTE — Progress Notes (Signed)
Pt left unit for DC to home in stable condition using a w/c. Family with pt. Pt took all belongings

## 2022-08-03 NOTE — Progress Notes (Signed)
Patient and sister educated thoroughly on 12/15 and 12/14 about cause of hemorrhages that led to seizure, HTN. A low salt diet and control of HTN is of upmost importance to avoid future seizures and micro-hemorrhages in his brain. Sister brought food from [employer] after education. Attempted to reiterate hypertension, low salt dies, cause of bleed, and complications such as massive hemorrhagic stroke. Will continue to educate when appropriate.

## 2022-08-03 NOTE — Procedures (Signed)
HD Note:  Some information was entered later than the data was gathered due to patient care needs. The stated time with the data is accurate.  Received patient in bed to unit.  Alert and oriented.  Informed consent signed and in chart.    Patient tolerated well.  Transported back to the room  Alert, without acute distress.  Hand-off given to patient's nurse.   Access used: HD catheter Access issues: None  Total UF removed: 1500 ml   [facility]

## 2022-08-03 NOTE — TOC Transition Note (Signed)
Transition of Care [facility]) - CM/SW Discharge Note   Patient Details  Name: Tanner Rollins MRN: [id number] Date of Birth: [birth year]  [facility] CM/SW Contact:  Carles Collet, RN Phone Number: 08/03/2022, 2:23 PM   Clinical Narrative:     Patient to DC to home today. Notified liaison w [facility] of DC.  Transportation for HD on AVS, requested nurse to emphasize this with him as well when reviewing DC instructions. No other TOC needs identified at this time    Barriers to Discharge: Continued Medical Work up   Patient Goals and CMS Choice   CMS Medicare.gov Compare Post Acute Care list provided to:: Patient Choice offered to / list presented to : Patient  Discharge Placement                       Discharge Plan and Services   Discharge Planning Services: CM Consult Post Acute Care Choice: Home Health                    HH Arranged: PT, OT [facility] Agency: [place] ([facility]) Date [place]: 08/02/22   Representative spoke with at [place]: Caryl Pina  Social Determinants of Health ([place]) Interventions     Readmission Risk Interventions     No data to display

## 2022-08-03 NOTE — Discharge Summary (Signed)
Physician Discharge Summary   Patient: Tanner Rollins MRN: [id number] DOB: 02/03/1951  Admit date:     07/30/2022  Discharge date: 08/03/22  Discharge Physician: Raiford Noble, DO   PCP: Pcp, No   Recommendations at discharge:    Follow up with PCP within 1-2 weeks and repeat CBC, CMP, Mag, Phos within 1 week Follow up with Neurology within 1-2 weeks Follow up with Nephrology and continue regularly scheduled dialysis  Discharge Diagnoses: Principal Problem:   Status epilepticus ([place]) Active Problems:   ESRD (end stage renal disease) on dialysis ([place])   Severe hypertension   A-fib ([place])  Resolved Problems:   * No resolved hospital problems. [facility] Course: The patient is a 71 year old African-American male with a past medical history significant for but limited to end-stage renal disease on hemodialysis as well as other comorbidities who was noted to be off balance and had direct movements on the left arm eventually had seizures. EMS was contacted and per them he was seizing for about an hour and had to be given Versed. CTA of the head was done and showed no acute findings. No LVO. Neurology was consulted and patient was loaded with Keppra and is improved. He is back to his baseline and improving slowly. He underwent dialysis yesterday. PT OT evaluated and recommending SNF however he has increased support from his sister and likely will go home with her. He is improved and transportation has been arranged for him to go to his dialysis sessions with this Bus.  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 -Neurology consulted long-term EEG showed the presence of right focal abnormality Continue IV Keppra and changed to p.o. Awaiting neurology further recommendations but they are recommending maintaining  seizure precautions PT OT and resuming Eliquis -Driving precautions have been given. Out of bed to chair consult physical therapy. -PT OT recommending SNF but patient wants to go home with home health and he does have 24-hour care and assistance given with his sister however patient was still driving to HD and cannot drive for 6 months at least a minimum given his seizure; transferred patient has been arranged and he is medically stable   ESRD (end stage renal disease) on dialysis [facility]): Hyperphosphatemia Hyperkalemia Nephrology has been consulted for HD and he was taken yesterday and will be going tomorrow -Patient's K+ is resolved -Phos Level went from 8.7 -> 6.8 -Patient's BUN/Cr went from 40/13.26 -> 28/10.01 and was 38/12.33 at the time of discharge -After dialysis and after transportation arrangements have been made for patient's hemodialysis he can be safely discharged   Severe Hypertension: -Initially elevated on admission likely due to seizures Home meds have been resumed blood pressure has improved prior to discharge   Paroxysmal A-fib ([place]) Resume amnio and Eliquis.   Normocytic Anemia/Anemia of Chronic Disease -Patient's Hgb/Hct went from 12.8/38.5 -> 11.2/34.9 -> 13.1/39.4 and is now 12.2/37.5 -Anemia panel was checked and showed an iron level of 83, UIBC 140, TIBC of 223, saturation ratio 37%, ferritin level of 1024, folate of 28.4 and a vitamin B12 of 1016 -Continue to Monitor for S/Sx of Bleeding; No overt bleeding noted -Repeat CBC within 1 week  Severe Malnutrition in the Context of Chronic illness Nutrition Status: Nutrition Problem: Severe Malnutrition Etiology: chronic illness (ESRD on HD) Signs/Symptoms: severe fat depletion, severe muscle depletion  Interventions: Refer to RD note for recommendations Nutrition Documentation    [place] [facility] to Hosp-Admission (Current) from 07/30/2022 in [place] 3W Progressive Care  Nutrition Problem Severe Malnutrition   Etiology chronic illness  [ESRD on HD]  Nutrition Goal Patient will meet greater than or equal to 90% of their needs  Interventions Refer to RD note for recommendations      Consultants: Neurology, Nephrology  Procedures performed: Hemodialysis Disposition: Home health as patient has refused SNF  Diet recommendation:  Discharge Diet Orders (From admission, onward)     Start     Ordered   08/03/22 0000  Diet - low sodium heart healthy       Comments: RENAL Fluid with 1200 mL Fluid Restriction   08/03/22 1341           Renal diet DISCHARGE MEDICATION: Allergies as of 08/03/2022   No Known Allergies      Medication List     TAKE these medications    amiodarone 200 MG tablet Commonly known as: Pacerone Take 1 tablet (200 mg total) by mouth daily.   apixaban 5 MG Tabs tablet Commonly known as: ELIQUIS Take 1 tablet (5 mg total) by mouth 2 (two) times daily.   atorvastatin 40 MG tablet Commonly known as: LIPITOR Take 20 mg by mouth daily.   calcium acetate 667 MG capsule Commonly known as: PHOSLO Take 2 capsules (1,334 mg total) by mouth 3 (three) times daily with meals.   cloNIDine 0.1 MG tablet Commonly known as: CATAPRES Take 0.1 mg by mouth 2 (two) times daily.   diltiazem 180 MG 24 hr capsule Commonly known as: CARDIZEM CD Take 1 capsule (180 mg total) by mouth daily.   docusate sodium 100 MG capsule Commonly known as: COLACE Take 1 capsule (100 mg total) by mouth 2 (two) times daily.   feeding supplement (NEPRO CARB STEADY) Liqd Take 237 mLs by mouth every evening.   levETIRAcetam 500 MG tablet Commonly known as: KEPPRA Take 1 tablet (500 mg total) by mouth 2 (two) times daily.   multivitamin with minerals Tabs tablet Take 1 tablet by mouth daily.   ondansetron 4 MG tablet Commonly known as: ZOFRAN Take 1 tablet (4 mg total) by mouth every 6 (six) hours as needed for nausea.   polyethylene glycol 17 g packet Commonly known as: MIRALAX /  GLYCOLAX Take 17 g by mouth daily as needed for moderate constipation.        Follow-up Information     [facility] transportation Follow up.   Why: 9:30-10:00 am Pick up on Tues, thur, sat  3:30-4:00 pm take you home Contact information: [phone]  Call next week and schedule for the following week        [facility] Follow up.   Why: for home health services. they will call you to set up a time to come to your house this week Contact information: [place] [address] [phone]                Discharge Exam: Filed Weights   08/01/22 1938 08/03/22 0950 08/03/22 1406  Weight: 65.5 kg 67.6 kg 66.2 kg   Vitals:   08/03/22 1356 08/03/22 1500  BP: (!) 141/97 (!) 152/88  Pulse: 84 82  Resp: 12 16  Temp: 98.1 F (36.7 C) 98.3 F (36.8 C)  SpO2: 99% 100%   Examination: Physical Exam:  Constitutional: WN/WD thin African-American male in no acute distress  Respiratory: Diminished to auscultation bilaterally, no wheezing, rales, rhonchi or crackles. Normal respiratory effort and patient is not tachypenic. No accessory muscle use.  Unlabored breathing Cardiovascular: RRR, no murmurs / rubs / gallops. S1 and S2 auscultated. No extremity edema.  Abdomen: Soft, non-tender, non-distended. Bowel sounds positive.  GU: Deferred. Musculoskeletal: No clubbing / cyanosis of digits/nails. No joint deformity upper and lower extremities Skin: No rashes, lesions, ulcers limited skin evaluation. No induration; Warm and dry.  Neurologic: CN 2-12 grossly intact with no focal deficits. Romberg sign and cerebellar reflexes not assessed.  Psychiatric: Normal judgment and insight. Alert and oriented x 3. Normal mood and appropriate affect.   Condition at discharge: stable  The results of significant diagnostics from this hospitalization (including imaging, microbiology, ancillary and laboratory) are listed below for reference.   Imaging  Studies: MR BRAIN WO CONTRAST  Result Date: 07/31/2022 CLINICAL DATA:  Neuro deficit, acute, stroke suspected EXAM: MRI HEAD WITHOUT CONTRAST TECHNIQUE: Multiplanar, multiecho pulse sequences of the brain and surrounding structures were obtained without intravenous contrast. COMPARISON:  CT head 07/30/2022. FINDINGS: Brain: No definite evidence of acute infarct. Small foci of DWI hyperintensity in the right parieto-occipital region are favored artifactual given areas of susceptibility artifact in this region. Multiple areas of prior hemorrhage with associated susceptibility artifact throughout the infratentorial (including brainstem and cerebellum) and supratentorial brain (including thalami, basal ganglia and cerebral hemispheres). Also, this includes prior lobar hemorrhagic infarcts in the right basal ganglia and right parietal lobe with associated encephalomalacia. No evidence of acute hemorrhage, mass lesion or midline shift. Vascular: Major arterial flow voids are maintained the skull base. Skull and upper cervical spine: Normal marrow signal. Sinuses/Orbits: Clear sinuses.  No acute orbital findings. Other: No mastoid effusions. IMPRESSION: 1. No definite evidence of acute infarct. Small foci of DWI hyperintensity in the right parieto-occipital region are favored artifactual given areas of susceptibility artifact in this region. 2. Many prior lobar and microhemorrhages, which are detailed above and could be related to chronic hypertension and/or amyloid angiopathy. Electronically Signed   By: Margaretha Sheffield M.D.   On: 07/31/2022 13:10   EEG adult  Result Date: 07/31/2022 Alric Ran, MD     07/31/2022  9:49 AM History: 71 y.o. male past medical history of ESRD on hemodialysis, paroxysmal atrial fibrillation on Eliquis, hypertension presented via EMS from home with complaints of seizure activity. EEG classification:  asleep Description of the recording: The background rhythms of this recording  consists of a fairly well modulated medium amplitude symmetric background activity . As the record starts, the patient is in the early stage II sleep during the recording, with rudimentary sleep spindles and vertex sharp wave activity seen. Photic stimulation was not performed. Hyperventilation was not performed. No epileptiform discharges seen during this recording. There was right focal slowing. Abnormality: Right focal slowing Impression: This is an abnormal EEG recording in the sleeping state due to presence of right focal slowing which is consistent with an area of neuronal dysfunction. Alric Ran, MD   DG Chest Port 1 View  Result Date: 07/30/2022 CLINICAL DATA:  Seizure EXAM: PORTABLE CHEST 1 VIEW COMPARISON:  02/12/2022 FINDINGS: Single frontal view of the chest demonstrate stable right internal jugular dialysis catheter. The cardiac silhouette is unremarkable. Mild central vascular congestion without airspace disease, effusion, or pneumothorax. No acute bony abnormality. IMPRESSION: 1. Mild central vascular congestion.  No acute airspace disease. Electronically Signed   By: Randa Ngo M.D.   On: 07/30/2022 23:38   CT  ANGIO HEAD NECK W WO CM W PERF (CODE STROKE)  Result Date: 07/30/2022 CLINICAL DATA:  Acute neurologic deficit EXAM: CT ANGIOGRAPHY HEAD AND NECK CT PERFUSION BRAIN TECHNIQUE: Multidetector CT imaging of the head and neck was performed using the standard protocol during bolus administration of intravenous contrast. Multiplanar CT image reconstructions and MIPs were obtained to evaluate the vascular anatomy. Carotid stenosis measurements (when applicable) are obtained utilizing NASCET criteria, using the distal internal carotid diameter as the denominator. Multiphase CT imaging of the brain was performed following IV bolus contrast injection. Subsequent parametric perfusion maps were calculated using RAPID software. RADIATION DOSE REDUCTION: This exam was performed according to  the departmental dose-optimization program which includes automated exposure control, adjustment of the mA and/or kV according to patient size and/or use of iterative reconstruction technique. CONTRAST:  158mL OMNIPAQUE IOHEXOL 350 MG/ML SOLN COMPARISON:  None Available. FINDINGS: CTA NECK FINDINGS SKELETON: There is no bony spinal canal stenosis. No lytic or blastic lesion. OTHER NECK: Normal pharynx, larynx and major salivary glands. No cervical lymphadenopathy. Unremarkable thyroid gland. UPPER CHEST: No pneumothorax or pleural effusion. No nodules or masses. AORTIC ARCH: There is no calcific atherosclerosis of the aortic arch. There is no aneurysm, dissection or hemodynamically significant stenosis of the visualized portion of the aorta. Conventional 3 vessel aortic branching pattern. The visualized proximal subclavian arteries are widely patent. RIGHT CAROTID SYSTEM: Normal without aneurysm, dissection or stenosis. LEFT CAROTID SYSTEM: Normal without aneurysm, dissection or stenosis. VERTEBRAL ARTERIES: Left dominant configuration. Both origins are clearly patent. There is no dissection, occlusion or flow-limiting stenosis to the skull base (V1-V3 segments). CTA HEAD FINDINGS POSTERIOR CIRCULATION: --Vertebral arteries: Normal V4 segments. --Inferior cerebellar arteries: Normal. --Basilar artery: Normal. --Superior cerebellar arteries: Normal. --Posterior cerebral arteries (PCA): Normal. ANTERIOR CIRCULATION: --Intracranial internal carotid arteries: Normal. --Anterior cerebral arteries (ACA): Normal. Both A1 segments are present. Patent anterior communicating artery (a-comm). --Middle cerebral arteries (MCA): Normal. VENOUS SINUSES: As permitted by contrast timing, patent. ANATOMIC VARIANTS: None Review of the MIP images confirms the above findings. CT Brain Perfusion Findings: ASPECTS: 10 CBF (<30%) Volume: 75mL Perfusion (Tmax>6.0s) volume: 20mL reported, but likely artifactual given degree of motion Mismatch  Volume: 43mL Infarction Location:None IMPRESSION: 1. No emergent large vessel occlusion or high-grade stenosis of the intracranial arteries. 2. No core infarct or ischemic penumbra identified by CT perfusion. Reported 4 mL mismatch is likely a motion/bolus timing artifact. Electronically Signed   By: Ulyses Jarred M.D.   On: 07/30/2022 23:35   CT HEAD CODE STROKE WO CONTRAST  Result Date: 07/30/2022 CLINICAL DATA:  Code stroke. EXAM: CT HEAD WITHOUT CONTRAST TECHNIQUE: Contiguous axial images were obtained from the base of the skull through the vertex without intravenous contrast. RADIATION DOSE REDUCTION: This exam was performed according to the departmental dose-optimization program which includes automated exposure control, adjustment of the mA and/or kV according to patient size and/or use of iterative reconstruction technique. COMPARISON:  None Available. FINDINGS: Brain: There is no mass, hemorrhage or extra-axial collection. The size and configuration of the ventricles and extra-axial CSF spaces are normal. 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 There is periventricular hypoattenuation compatible with chronic microvascular disease. Focal encephalomalacia of the right parietal lobe, unchanged. Vascular: No abnormal hyperdensity of the major intracranial arteries or dural venous sinuses. No intracranial atherosclerosis. Skull: The visualized skull base, calvarium and extracranial soft tissues are normal. Sinuses/Orbits: No fluid levels or advanced mucosal thickening of the visualized paranasal sinuses. No mastoid or middle  ear effusion. The orbits are normal. ASPECTS [facility] Stroke Program Early CT Score) - Ganglionic level infarction (caudate, lentiform nuclei, internal capsule, insula, M1-M3 cortex): 7 - Supraganglionic infarction (M4-M6 cortex): 3 Total score (0-10 with 10 being normal): 10 IMPRESSION: 1. No acute intracranial abnormality. 2. ASPECTS is 10. 3. 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 These results were communicated to Dr. Amie Portland at 11:06 pm on 07/30/2022 by text page via the [facility] messaging system. Electronically Signed   By: Ulyses Jarred M.D.   On: 07/30/2022 23:06    Microbiology: Results for orders placed or performed during the hospital encounter of 02/02/22  Blood Culture (routine x 2)     Status: None   Collection Time: 02/02/22  5:12 PM   Specimen: BLOOD  Result Value Ref Range Status   Specimen Description BLOOD RIGHT ANTECUBITAL  Final   Special Requests   Final    BOTTLES DRAWN AEROBIC AND ANAEROBIC Blood Culture results may not be optimal due to an inadequate volume of blood received in culture bottles   Culture   Final    NO GROWTH 5 DAYS Performed at [facility], [place] [address], [place], [place] [address]    Report Status 02/07/2022 FINAL  Final  Blood Culture (routine x 2)     Status: None   Collection Time: 02/02/22  5:17 PM   Specimen: BLOOD RIGHT FOREARM  Result Value Ref Range Status   Specimen Description BLOOD RIGHT FOREARM  Final   Special Requests   Final    BOTTLES DRAWN AEROBIC AND ANAEROBIC Blood Culture results may not be optimal due to an inadequate volume of blood received in culture bottles   Culture   Final    NO GROWTH 5 DAYS Performed at [facility], [place] [address] [place], [place] [address]    Report Status 02/07/2022 FINAL  Final  MRSA Next Gen by PCR, Nasal     Status: None   Collection Time: 02/02/22  9:21 PM   Specimen: Nasal Mucosa; Nasal Swab  Result Value Ref Range Status   MRSA by PCR Next Gen NOT DETECTED NOT DETECTED Final    Comment: (NOTE) The GeneXpert MRSA Assay (FDA approved for NASAL specimens only), is one component of a comprehensive MRSA colonization surveillance program. It is not intended to diagnose MRSA infection nor to guide or monitor treatment for MRSA infections. Test performance is not FDA approved in patients less than 13  years old. Performed at [facility], [place] [address] [place], [place] [address]   Gram stain     Status: None   Collection Time: 02/12/22 12:12 PM   Specimen: Lung, Left; Pleural Fluid  Result Value Ref Range Status   Specimen Description PLEURAL  Final   Special Requests LEFT LUNG  Final   Gram Stain   Final    WBC PRESENT, PREDOMINANTLY PMN NO ORGANISMS SEEN CYTOSPIN SMEAR Performed at [facility], [place] [address], [place], [place] [address]    Report Status 02/12/2022 FINAL  Final  Culture, body fluid w Gram Stain-bottle     Status: None   Collection Time: 02/12/22 12:12 PM   Specimen: Pleura  Result Value Ref Range Status   Specimen Description PLEURAL  Final   Special Requests LEFT LUNG  Final   Culture   Final    NO GROWTH 5 DAYS Performed at [place] [address], [place], [place] [address]  Report Status 02/17/2022 FINAL  Final   Labs: CBC: Recent Labs  Lab 07/30/22 2356 07/31/22 0002 08/01/22 1014 08/02/22 0854 08/03/22 0347  WBC 5.6  --  5.1 5.5 5.0  NEUTROABS 4.7  --   --  2.7 2.2  HGB 12.8* 13.9 11.2* 13.1 12.2*  HCT 38.5* 41.0 34.9* 39.4 37.5*  MCV 90.2  --  92.6 90.0 90.8  PLT 146*  --  158 177 315   Basic Metabolic Panel: Recent Labs  Lab 07/30/22 2356 07/31/22 0002 08/01/22 1015 08/02/22 0854 08/03/22 0347  NA 137 134* 137 136 133*  K 4.9 4.8 5.3* 4.8 4.7  CL 94* 98 95* 93* 91*  CO2 27  --  22 24 25   GLUCOSE 90 88 69* 84 90  BUN 21 23 40* 28* 38*  CREATININE 9.13* 10.40* 13.26* 10.01* 12.33*  CALCIUM 9.7  --  9.4 10.2 10.2  MG 2.3  --   --  2.2 2.3  PHOS 5.8*  --  8.7* 6.8* 8.2*   Liver Function Tests: Recent Labs  Lab 07/30/22 2356 08/01/22 1015 08/02/22 0854 08/03/22 0347  AST 18  --  19 16  ALT 11  --  10 9  ALKPHOS 93  --  95 75  BILITOT 0.5  --  0.9 0.5  PROT 7.9  --  8.0 7.4  ALBUMIN 3.9 3.3* 3.9 3.8   CBG: No results for input(s): "GLUCAP" in the last 168 hours.  Discharge time spent:  greater than 30 minutes.  Signed: Raiford Noble, DO [facility] 08/03/2022

## 2022-09-12 ENCOUNTER — Telehealth: Payer: Self-pay

## 2022-09-12 ENCOUNTER — Other Ambulatory Visit: Payer: Self-pay

## 2022-09-12 NOTE — Telephone Encounter (Signed)
Referral request received from [facility] for a request for [facility] removal. Noted patient also had a referral for evaluation for fistula ligation of branches. Spoke with Elmyra Ricks who confirmed [facility] removal request and reports fistula is able to be cannulated but was informed by [facility] some time ago, that patient had branches that needed ligation. Advised office scheduler will contact patient for office appointment.   Spoke with patient and scheduled for catheter removal on 09/20/22. Instructions provided and he verbalized understanding.

## 2022-09-16 ENCOUNTER — Other Ambulatory Visit: Payer: Self-pay | Admitting: *Deleted

## 2022-09-16 DIAGNOSIS — N186 End stage renal disease: Secondary | ICD-10-CM

## 2022-09-18 ENCOUNTER — Ambulatory Visit (HOSPITAL_COMMUNITY)
Admission: RE | Admit: 2022-09-18 | Discharge: 2022-09-18 | Disposition: A | Discharge status: Home / Self Care | Payer: No Typology Code available for payment source | Source: Ambulatory Visit | Attending: Vascular Surgery | Admitting: Vascular Surgery

## 2022-09-18 ENCOUNTER — Telehealth: Payer: Self-pay

## 2022-09-18 ENCOUNTER — Ambulatory Visit (INDEPENDENT_AMBULATORY_CARE_PROVIDER_SITE_OTHER): Payer: No Typology Code available for payment source

## 2022-09-18 VITALS — BP 181/98 | HR 80 | Temp 97.8°F | Ht 72.0 in | Wt 157.0 lb

## 2022-09-18 DIAGNOSIS — N186 End stage renal disease: Secondary | ICD-10-CM

## 2022-09-18 DIAGNOSIS — Z992 Dependence on renal dialysis: Secondary | ICD-10-CM | POA: Diagnosis not present

## 2022-09-18 NOTE — Progress Notes (Signed)
Established Dialysis Access   History of Present Illness   Tiron Suski is a 72 y.o. (Mar 21, 1951) male who presents for re-evaluation of dialysis access.  He has a history of left arm 1 stage basilic vein transposition on 01/28/2022 by Dr. Scot Dock.  Postoperatively, the patient developed some mid fistula stenosis concerning for future failure.  At that time there appeared to be a pulsatile, engorged branch off the fistula as well.  He was scheduled for left arm fistula branch ligation with intraop fistulogram, however no branch was visualized during his procedure.  He only underwent balloon venoplasty of the fistula on 06/10/2022 by Dr. Scot Dock.   He has been sent back to our office for reevaluation of possible branch ligation.  The patient's fistula has been functioning well 3 times weekly since his procedure in October.  He denies any symptoms of steal.  He has been arranged for right IJ TDC removal on February 2.  Current Outpatient Medications  Medication Sig Dispense Refill   amiodarone (PACERONE) 200 MG tablet Take 1 tablet (200 mg total) by mouth daily. 30 tablet 0   apixaban (ELIQUIS) 5 MG TABS tablet Take 1 tablet (5 mg total) by mouth 2 (two) times daily. 60 tablet    atorvastatin (LIPITOR) 40 MG tablet Take 20 mg by mouth daily.     calcium acetate (PHOSLO) 667 MG capsule Take 2 capsules (1,334 mg total) by mouth 3 (three) times daily with meals.     cloNIDine (CATAPRES) 0.1 MG tablet Take 0.1 mg by mouth 2 (two) times daily.     diltiazem (CARDIZEM CD) 180 MG 24 hr capsule Take 1 capsule (180 mg total) by mouth daily.     docusate sodium (COLACE) 100 MG capsule Take 1 capsule (100 mg total) by mouth 2 (two) times daily. 10 capsule 0   levETIRAcetam (KEPPRA) 500 MG tablet Take 1 tablet (500 mg total) by mouth 2 (two) times daily. 60 tablet 0   Multiple Vitamin (MULTIVITAMIN WITH MINERALS) TABS tablet Take 1 tablet by mouth daily.     Nutritional Supplements (FEEDING  SUPPLEMENT, NEPRO CARB STEADY,) LIQD Take 237 mLs by mouth every evening.     ondansetron (ZOFRAN) 4 MG tablet Take 1 tablet (4 mg total) by mouth every 6 (six) hours as needed for nausea. 20 tablet 0   polyethylene glycol (MIRALAX / GLYCOLAX) 17 g packet Take 17 g by mouth daily as needed for moderate constipation. 14 each 0   No current facility-administered medications for this visit.    REVIEW OF SYSTEMS (negative unless checked):   Cardiac:  []  Chest pain or chest pressure? []  Shortness of breath upon activity? []  Shortness of breath when lying flat? []  Irregular heart rhythm?  Vascular:  []  Pain in calf, thigh, or hip brought on by walking? []  Pain in feet at night that wakes you up from your sleep? []  Blood clot in your veins? []  Leg swelling?  Pulmonary:  []  Oxygen at home? []  Productive cough? []  Wheezing?  Neurologic:  []  Sudden weakness in arms or legs? []  Sudden numbness in arms or legs? []  Sudden onset of difficult speaking or slurred speech? []  Temporary loss of vision in one eye? []  Problems with dizziness?  Gastrointestinal:  []  Blood in stool? []  Vomited blood?  Genitourinary:  []  Burning when urinating? []  Blood in urine?  Psychiatric:  []  Major depression  Hematologic:  []  Bleeding problems? []  Problems with blood clotting?  Dermatologic:  []  Rashes or ulcers?  Constitutional:  []  Fever or chills?  Ear/Nose/Throat:  []  Change in hearing? []  Nose bleeds? []  Sore throat?  Musculoskeletal:  []  Back pain? []  Joint pain? []  Muscle pain?   Physical Examination   Vitals:   09/18/22 1325  BP: (!) 181/98  Pulse: 80  Temp: 97.8 F (36.6 C)  TempSrc: Temporal  SpO2: 96%  Weight: 157 lb (71.2 kg)  Height: 6' (1.829 m)   Body mass index is 21.29 kg/m.  General:  WDWN in NAD; vital signs documented above Gait: Not observed HENT: WNL, normocephalic Pulmonary: normal non-labored breathing  Cardiac: Regular rate and  rhythm Abdomen: soft, NT, no masses Skin: without rashes Vascular Exam/Pulses: Palpable left radial pulse 2+ Extremities: Left basilic fistula with excellent thrill on palpation.  No branches noted. Musculoskeletal: no muscle wasting or atrophy  Neurologic: A&O X 3;  No focal weakness or paresthesias are detected Psychiatric:  The pt has Normal affect.   Non-invasive Vascular Imaging   left Arm Access Duplex  (09/18/2022):  +--------------------+----------+-----------------+--------+  AVF                PSV (cm/s)Flow Vol (mL/min)Comments  +--------------------+----------+-----------------+--------+  Native artery inflow   314          1854                 +--------------------+----------+-----------------+--------+  AVF Anastomosis        179                               +--------------------+----------+-----------------+--------+     +------------+----------+-------------+----------+--------+  OUTFLOW VEINPSV (cm/s)Diameter (cm)Depth (cm)Describe  +------------+----------+-------------+----------+--------+  Prox UA        570        1.28        0.36             +------------+----------+-------------+----------+--------+  Mid UA         497        0.68        0.25             +------------+----------+-------------+----------+--------+  Dist UA        616        0.53        0.37             +------------+----------+-------------+----------+--------+  AC Fossa       262        0.52        0.55             +------------+----------+-------------+----------+--------+    Medical Decision Making   Teige Rountree is a 72 y.o. male who presents for reevaluation of left basilic fistula  The patient has a history of left basilic fistula creation in 2023.  Postoperatively he developed some mid fistula stenosis with a pulsatile and engorged vessel off the fistula, seeming to be a branch.  He was then set up for possible left AV fistula branch  ligation with intraoperative fistulogram.  During his procedure, no branches were noted, therefore he only went balloon venoplasty of the fistula to resolve the stenosis. The patient's fistula has worked perfectly fine since his venoplasty in October 2023.  On duplex today there is no evidence of branches in need of ligation.  The fistula is well matured with great flow.  He has no steal symptoms Dialysis can continue to use his left basilic fistula for access.  He is  scheduled for right IJ TDC removal on February 2 He can follow-up with [place] as needed   Vicente Serene  PA-C [facility] Office: [facility] MD: Donzetta Matters

## 2022-09-18 NOTE — Telephone Encounter (Signed)
Nurse from [place], [place] is trying to determine who may have removed pt's perm cath, and if it has been removed by our office. She thinks it was [facility] and is going to call HD center to confirm. No further questions/concerns at this time.

## 2022-09-18 NOTE — Telephone Encounter (Signed)
Per Vicente Serene, PA-C, switch right IJ tunneled dialysis catheter removal from [facility] to interventional radiology. Informed patient and Destiny at [facility] of this change. The dialysis center will reach out to patient with new appointment information for IR to remove catheter. Procedure for 09/20/22 with [facility] has been cancelled. Both patient and Destiny verbalized understanding.

## 2022-09-20 ENCOUNTER — Encounter (HOSPITAL_COMMUNITY): Payer: No Typology Code available for payment source

## 2022-09-23 ENCOUNTER — Other Ambulatory Visit (HOSPITAL_COMMUNITY): Payer: Self-pay | Admitting: Nephrology

## 2022-09-23 DIAGNOSIS — N186 End stage renal disease: Secondary | ICD-10-CM

## 2022-09-23 DIAGNOSIS — Z992 Dependence on renal dialysis: Secondary | ICD-10-CM

## 2022-10-04 ENCOUNTER — Ambulatory Visit (HOSPITAL_COMMUNITY)
Admission: RE | Admit: 2022-10-04 | Discharge: 2022-10-04 | Disposition: A | Discharge status: Home / Self Care | Payer: No Typology Code available for payment source | Source: Ambulatory Visit | Attending: Nephrology | Admitting: Nephrology

## 2022-10-04 DIAGNOSIS — Z4901 Encounter for fitting and adjustment of extracorporeal dialysis catheter: Secondary | ICD-10-CM | POA: Insufficient documentation

## 2022-10-04 DIAGNOSIS — N186 End stage renal disease: Secondary | ICD-10-CM | POA: Diagnosis not present

## 2022-10-04 HISTORY — PX: IR REMOVAL TUN CV CATH W/O FL: IMG2289

## 2022-10-04 MED ORDER — CHLORHEXIDINE GLUCONATE 4 % EX LIQD
CUTANEOUS | Status: AC
  Filled 2022-10-04: qty 15

## 2022-10-04 MED ORDER — LIDOCAINE HCL 1 % IJ SOLN
INTRAMUSCULAR | Status: AC
  Administered 2022-10-04: 15 mL
  Filled 2022-10-04: qty 20

## 2023-02-18 ENCOUNTER — Encounter (HOSPITAL_COMMUNITY): Payer: Self-pay | Admitting: Student in an Organized Health Care Education/Training Program

## 2023-02-18 ENCOUNTER — Emergency Department (HOSPITAL_COMMUNITY): Payer: No Typology Code available for payment source

## 2023-02-18 ENCOUNTER — Inpatient Hospital Stay (HOSPITAL_COMMUNITY): Payer: No Typology Code available for payment source

## 2023-02-18 ENCOUNTER — Other Ambulatory Visit (HOSPITAL_COMMUNITY): Payer: No Typology Code available for payment source

## 2023-02-18 ENCOUNTER — Inpatient Hospital Stay (HOSPITAL_COMMUNITY)
Admission: EM | Admit: 2023-02-18 | Discharge: 2023-02-21 | DRG: 064 | Disposition: A | Discharge status: Rehabilitation Facility | Payer: No Typology Code available for payment source | Attending: Student in an Organized Health Care Education/Training Program | Admitting: Student in an Organized Health Care Education/Training Program

## 2023-02-18 ENCOUNTER — Other Ambulatory Visit: Payer: Self-pay

## 2023-02-18 DIAGNOSIS — Z8249 Family history of ischemic heart disease and other diseases of the circulatory system: Secondary | ICD-10-CM

## 2023-02-18 DIAGNOSIS — G40909 Epilepsy, unspecified, not intractable, without status epilepticus: Secondary | ICD-10-CM

## 2023-02-18 DIAGNOSIS — Z91148 Patient's other noncompliance with medication regimen for other reason: Secondary | ICD-10-CM | POA: Diagnosis not present

## 2023-02-18 DIAGNOSIS — D631 Anemia in chronic kidney disease: Secondary | ICD-10-CM | POA: Diagnosis present

## 2023-02-18 DIAGNOSIS — N186 End stage renal disease: Secondary | ICD-10-CM

## 2023-02-18 DIAGNOSIS — I4891 Unspecified atrial fibrillation: Secondary | ICD-10-CM | POA: Diagnosis present

## 2023-02-18 DIAGNOSIS — Z992 Dependence on renal dialysis: Secondary | ICD-10-CM | POA: Diagnosis not present

## 2023-02-18 DIAGNOSIS — G4700 Insomnia, unspecified: Secondary | ICD-10-CM | POA: Diagnosis present

## 2023-02-18 DIAGNOSIS — R29810 Facial weakness: Secondary | ICD-10-CM | POA: Diagnosis present

## 2023-02-18 DIAGNOSIS — I639 Cerebral infarction, unspecified: Secondary | ICD-10-CM | POA: Diagnosis not present

## 2023-02-18 DIAGNOSIS — D638 Anemia in other chronic diseases classified elsewhere: Secondary | ICD-10-CM | POA: Diagnosis not present

## 2023-02-18 DIAGNOSIS — I480 Paroxysmal atrial fibrillation: Secondary | ICD-10-CM | POA: Diagnosis present

## 2023-02-18 DIAGNOSIS — I69354 Hemiplegia and hemiparesis following cerebral infarction affecting left non-dominant side: Secondary | ICD-10-CM | POA: Diagnosis not present

## 2023-02-18 DIAGNOSIS — Z91158 Patient's noncompliance with renal dialysis for other reason: Secondary | ICD-10-CM | POA: Diagnosis not present

## 2023-02-18 DIAGNOSIS — Z79899 Other long term (current) drug therapy: Secondary | ICD-10-CM

## 2023-02-18 DIAGNOSIS — I120 Hypertensive chronic kidney disease with stage 5 chronic kidney disease or end stage renal disease: Secondary | ICD-10-CM | POA: Diagnosis present

## 2023-02-18 DIAGNOSIS — R569 Unspecified convulsions: Secondary | ICD-10-CM | POA: Diagnosis not present

## 2023-02-18 DIAGNOSIS — M25552 Pain in left hip: Secondary | ICD-10-CM | POA: Diagnosis not present

## 2023-02-18 DIAGNOSIS — I10 Essential (primary) hypertension: Secondary | ICD-10-CM | POA: Diagnosis present

## 2023-02-18 DIAGNOSIS — N2581 Secondary hyperparathyroidism of renal origin: Secondary | ICD-10-CM | POA: Diagnosis present

## 2023-02-18 DIAGNOSIS — Z7901 Long term (current) use of anticoagulants: Secondary | ICD-10-CM | POA: Diagnosis not present

## 2023-02-18 DIAGNOSIS — J439 Emphysema, unspecified: Secondary | ICD-10-CM | POA: Diagnosis present

## 2023-02-18 DIAGNOSIS — T45516A Underdosing of anticoagulants, initial encounter: Secondary | ICD-10-CM | POA: Diagnosis present

## 2023-02-18 DIAGNOSIS — I63411 Cerebral infarction due to embolism of right middle cerebral artery: Secondary | ICD-10-CM | POA: Diagnosis not present

## 2023-02-18 DIAGNOSIS — R4182 Altered mental status, unspecified: Secondary | ICD-10-CM | POA: Diagnosis not present

## 2023-02-18 DIAGNOSIS — Z87891 Personal history of nicotine dependence: Secondary | ICD-10-CM

## 2023-02-18 DIAGNOSIS — E785 Hyperlipidemia, unspecified: Secondary | ICD-10-CM | POA: Diagnosis present

## 2023-02-18 DIAGNOSIS — I6300 Cerebral infarction due to thrombosis of unspecified precerebral artery: Secondary | ICD-10-CM | POA: Diagnosis not present

## 2023-02-18 DIAGNOSIS — G8194 Hemiplegia, unspecified affecting left nondominant side: Secondary | ICD-10-CM | POA: Diagnosis present

## 2023-02-18 DIAGNOSIS — N400 Enlarged prostate without lower urinary tract symptoms: Secondary | ICD-10-CM | POA: Diagnosis present

## 2023-02-18 DIAGNOSIS — I69398 Other sequelae of cerebral infarction: Secondary | ICD-10-CM | POA: Diagnosis not present

## 2023-02-18 DIAGNOSIS — I6503 Occlusion and stenosis of bilateral vertebral arteries: Secondary | ICD-10-CM | POA: Diagnosis present

## 2023-02-18 DIAGNOSIS — I700 Atherosclerosis of aorta: Secondary | ICD-10-CM | POA: Diagnosis present

## 2023-02-18 DIAGNOSIS — I6349 Cerebral infarction due to embolism of other cerebral artery: Secondary | ICD-10-CM | POA: Diagnosis present

## 2023-02-18 DIAGNOSIS — I6389 Other cerebral infarction: Secondary | ICD-10-CM | POA: Diagnosis not present

## 2023-02-18 HISTORY — DX: Epilepsy, unspecified, not intractable, without status epilepticus: G40.909

## 2023-02-18 LAB — GLUCOSE, CAPILLARY
Glucose-Capillary: 62 mg/dL — ABNORMAL LOW (ref 70–99)
Glucose-Capillary: 78 mg/dL (ref 70–99)
Glucose-Capillary: 93 mg/dL (ref 70–99)
Glucose-Capillary: 99 mg/dL (ref 70–99)

## 2023-02-18 LAB — COMPREHENSIVE METABOLIC PANEL
ALT: 13 U/L (ref 0–44)
AST: 18 U/L (ref 15–41)
Albumin: 3.5 g/dL (ref 3.5–5.0)
Alkaline Phosphatase: 87 U/L (ref 38–126)
Anion gap: 20 — ABNORMAL HIGH (ref 5–15)
BUN: 33 mg/dL — ABNORMAL HIGH (ref 8–23)
CO2: 22 mmol/L (ref 22–32)
Calcium: 9.2 mg/dL (ref 8.9–10.3)
Chloride: 94 mmol/L — ABNORMAL LOW (ref 98–111)
Creatinine, Ser: 12.75 mg/dL — ABNORMAL HIGH (ref 0.61–1.24)
GFR, Estimated: 4 mL/min — ABNORMAL LOW (ref >60)
Glucose, Bld: 120 mg/dL — ABNORMAL HIGH (ref 70–99)
Potassium: 4 mmol/L (ref 3.5–5.1)
Sodium: 136 mmol/L (ref 135–145)
Total Bilirubin: 0.7 mg/dL (ref 0.3–1.2)
Total Protein: 6.8 g/dL (ref 6.5–8.1)

## 2023-02-18 LAB — DIFFERENTIAL
Abs Immature Granulocytes: 0.01 10*3/uL (ref 0.00–0.07)
Basophils Absolute: 0 10*3/uL (ref 0.0–0.1)
Basophils Relative: 1 %
Eosinophils Absolute: 0.1 10*3/uL (ref 0.0–0.5)
Eosinophils Relative: 2 %
Immature Granulocytes: 0 %
Lymphocytes Relative: 22 %
Lymphs Abs: 1.2 10*3/uL (ref 0.7–4.0)
Monocytes Absolute: 0.6 10*3/uL (ref 0.1–1.0)
Monocytes Relative: 10 %
Neutro Abs: 3.7 10*3/uL (ref 1.7–7.7)
Neutrophils Relative %: 65 %

## 2023-02-18 LAB — CBG MONITORING, ED: Glucose-Capillary: 114 mg/dL — ABNORMAL HIGH (ref 70–99)

## 2023-02-18 LAB — CBC
HCT: 34.6 % — ABNORMAL LOW (ref 39.0–52.0)
Hemoglobin: 11.1 g/dL — ABNORMAL LOW (ref 13.0–17.0)
MCH: 31.4 pg (ref 26.0–34.0)
MCHC: 32.1 g/dL (ref 30.0–36.0)
MCV: 98 fL (ref 80.0–100.0)
Platelets: 196 10*3/uL (ref 150–400)
RBC: 3.53 MIL/uL — ABNORMAL LOW (ref 4.22–5.81)
RDW: 15.7 % — ABNORMAL HIGH (ref 11.5–15.5)
WBC: 5.6 10*3/uL (ref 4.0–10.5)
nRBC: 0 % (ref 0.0–0.2)

## 2023-02-18 LAB — I-STAT CHEM 8, ED
BUN: 38 mg/dL — ABNORMAL HIGH (ref 8–23)
Calcium, Ion: 1.03 mmol/L — ABNORMAL LOW (ref 1.15–1.40)
Chloride: 97 mmol/L — ABNORMAL LOW (ref 98–111)
Creatinine, Ser: 13.9 mg/dL — ABNORMAL HIGH (ref 0.61–1.24)
Glucose, Bld: 116 mg/dL — ABNORMAL HIGH (ref 70–99)
HCT: 35 % — ABNORMAL LOW (ref 39.0–52.0)
Hemoglobin: 11.9 g/dL — ABNORMAL LOW (ref 13.0–17.0)
Potassium: 3.9 mmol/L (ref 3.5–5.1)
Sodium: 136 mmol/L (ref 135–145)
TCO2: 29 mmol/L (ref 22–32)

## 2023-02-18 LAB — APTT: aPTT: 33 seconds (ref 24–36)

## 2023-02-18 LAB — PROTIME-INR
INR: 1.1 (ref 0.8–1.2)
Prothrombin Time: 14.8 seconds (ref 11.4–15.2)

## 2023-02-18 LAB — ETHANOL: Alcohol, Ethyl (B): <10 mg/dL (ref <10)

## 2023-02-18 MED ORDER — SENNOSIDES-DOCUSATE SODIUM 8.6-50 MG PO TABS: 1.0000 | ORAL_TABLET | Freq: Every evening | ORAL | Status: DC | PRN

## 2023-02-18 MED ORDER — ACETAMINOPHEN 325 MG PO TABS
650.0000 mg | ORAL_TABLET | Freq: Four times a day (QID) | ORAL | Status: DC | PRN
  Administered 2023-02-19 – 2023-02-20 (×4): 650 mg via ORAL
  Filled 2023-02-18 (×4): qty 2

## 2023-02-18 MED ORDER — LEVETIRACETAM IN NACL 500 MG/100ML IV SOLN
500.0000 mg | Freq: Two times a day (BID) | INTRAVENOUS | Status: DC
  Administered 2023-02-19 – 2023-02-21 (×5): 500 mg via INTRAVENOUS
  Filled 2023-02-18 (×5): qty 100

## 2023-02-18 MED ORDER — SODIUM CHLORIDE 0.9 % IV SOLN
250.0000 mg | INTRAVENOUS | Status: DC
  Administered 2023-02-20: 250 mg via INTRAVENOUS
  Filled 2023-02-18: qty 2.5

## 2023-02-18 MED ORDER — ACETAMINOPHEN 650 MG RE SUPP: 650.0000 mg | Freq: Four times a day (QID) | RECTAL | Status: DC | PRN

## 2023-02-18 MED ORDER — ATORVASTATIN CALCIUM 10 MG PO TABS: 20.0000 mg | ORAL_TABLET | Freq: Every day | ORAL | Status: DC

## 2023-02-18 MED ORDER — ATORVASTATIN CALCIUM 40 MG PO TABS
40.0000 mg | ORAL_TABLET | Freq: Every day | ORAL | Status: DC
  Administered 2023-02-19 – 2023-02-21 (×3): 40 mg via ORAL
  Filled 2023-02-18 (×3): qty 1

## 2023-02-18 MED ORDER — ATORVASTATIN CALCIUM 40 MG PO TABS: 40.0000 mg | ORAL_TABLET | Freq: Every day | ORAL | Status: DC

## 2023-02-18 MED ORDER — APIXABAN 5 MG PO TABS
5.0000 mg | ORAL_TABLET | Freq: Two times a day (BID) | ORAL | Status: DC
  Administered 2023-02-18 – 2023-02-21 (×6): 5 mg via ORAL
  Filled 2023-02-18 (×6): qty 1

## 2023-02-18 MED ORDER — LORAZEPAM 2 MG/ML IJ SOLN
2.0000 mg | Freq: Once | INTRAMUSCULAR | Status: AC
  Administered 2023-02-18: 2 mg via INTRAVENOUS

## 2023-02-18 MED ORDER — IOHEXOL 350 MG/ML SOLN
75.0000 mL | Freq: Once | INTRAVENOUS | Status: AC | PRN
  Administered 2023-02-18: 75 mL via INTRAVENOUS

## 2023-02-18 MED ORDER — LEVETIRACETAM IN NACL 1500 MG/100ML IV SOLN
1500.0000 mg | Freq: Once | INTRAVENOUS | Status: AC
  Administered 2023-02-18: 1500 mg via INTRAVENOUS
  Filled 2023-02-18: qty 100

## 2023-02-18 MED ORDER — SODIUM CHLORIDE 0.9% FLUSH: 3.0000 mL | Freq: Once | INTRAVENOUS | Status: DC

## 2023-02-18 MED ORDER — LORAZEPAM 2 MG/ML IJ SOLN
INTRAMUSCULAR | Status: AC
  Filled 2023-02-18: qty 1

## 2023-02-18 MED ORDER — LEVETIRACETAM IN NACL 500 MG/100ML IV SOLN: 500.0000 mg | Freq: Two times a day (BID) | INTRAVENOUS | Status: DC

## 2023-02-18 NOTE — Code Documentation (Signed)
Stroke Response Nurse Documentation Code Documentation  Tanner Rollins is a 72 y.o. male arriving to [facility]  via [facility] on 02/18/2023 with past medical hx of hypertension, end stage renal disease on hemodialysis (left arm fistula), and paroxysmal atrial fibrillation. On Eliquis (apixaban) daily however he doesn't take it as prescribed. States he takes it every other day but is unsure his last dose. Code stroke was activated by EMS.   Patient was driving to dialysis when he felt weak. where he was LKW at 0820 and now complaining of left sided weakness and confusion.   Stroke team at the bedside on patient arrival. Labs drawn and patient cleared for CT by EDP. Patient to CT with team. NIHSS 7, see documentation for details and code stroke times. Patient with disoriented, left facial droop, left arm weakness, left leg weakness, and left decreased sensation on exam. Left arm began twitching and a leftward gaze was noted. 2mg  ativan given [id number]. Patient states the The following imaging was completed:  CT Head and CTA. Patient is a candidate for IV Thrombolytic due to on Eliquis. Patient is not a candidate for IR due to no LVO. Patient's sister mentioned he was on keppra for seizures but stopped taking it 6 months ago per MD order. Devon, NP to investigate why this was stopped.   Care Plan: Q2 assessments.   Bedside handoff with ED RN Josh.    Ferman Hamming Stroke Response RN

## 2023-02-18 NOTE — ED Notes (Addendum)
Sister mentioned that pt prob has a hernia in lower abd/groin area.  Also, 3 skin abnormalities in the same general area that are healing wounds that had been draining puss at one time   She also mentioned that he has a previous seizure diagnosis and she thinks he has been on keppra before.

## 2023-02-18 NOTE — Consult Note (Signed)
Neurology Consultation  Reason for Consult: Code Stroke  CC: Left side weakness, left facial droop  History is obtained from: patient and relative(s)  HPI: Tanner Rollins is a 72 y.o. male with a past medical history of PAF on eliquis, seizure history- discharged 08/03/2022 on 500mg  keppra BID, HTN, ESRD on HD (fills medications through [facility] in [place]) presenting with left side weakness, left facial droop, confusion. He was seen in 07/2022 for left hemiparesis and seizure and during that admission LTM showed presences of right focal abnormality and he was on Keppra 500mg  BID. Per sister he was taken off of keppra after 6 months due to no recurrent seizures. On arrival he states that he does not take his Eliquis on his dialysis days because it lowers his blood pressure. He does believe that he took his Eliquis yesterday.  Seizure activity noted with left arm twitching and left gaze in CT. Given 2mg  of ativan and 1500mg  of keppra. After medications he was taken to MRI.   LKW: 0820 TNK given?: No on Eliquis Premorbid modified Rankin scale (mRS): 0 0-Completely asymptomatic and back to baseline post-stroke   ROS: Full ROS was performed and is negative except as noted in the HPI.  Past Medical History:  Diagnosis Date   ESRD on hemodialysis (HCC)    [facility]   Hypertension    PAF (paroxysmal atrial fibrillation) (HCC)     No family history on file.  Social History:   reports that he quit smoking about 33 years ago. His smoking use included cigarettes. He has never used smokeless tobacco. He reports that he does not drink alcohol and does not use drugs.  Medications  Current Facility-Administered Medications:    levETIRAcetam (KEPPRA) IVPB 500 mg/100 mL premix, 500 mg, Intravenous, Q12H, Shafer, Devon, NP   LORazepam (ATIVAN) 2 MG/ML injection, , , ,    sodium chloride flush (NS) 0.9 % injection 3 mL, 3 mL, Intravenous, Once, Gerhard Munch, MD  Current Outpatient  Medications:    amiodarone (PACERONE) 200 MG tablet, Take 1 tablet (200 mg total) by mouth daily., Disp: 30 tablet, Rfl: 0   apixaban (ELIQUIS) 5 MG TABS tablet, Take 1 tablet (5 mg total) by mouth 2 (two) times daily., Disp: 60 tablet, Rfl:    atorvastatin (LIPITOR) 40 MG tablet, Take 20 mg by mouth daily., Disp: , Rfl:    calcium acetate (PHOSLO) 667 MG capsule, Take 2 capsules (1,334 mg total) by mouth 3 (three) times daily with meals., Disp: , Rfl:    cloNIDine (CATAPRES) 0.1 MG tablet, Take 0.1 mg by mouth 2 (two) times daily., Disp: , Rfl:    diltiazem (CARDIZEM CD) 180 MG 24 hr capsule, Take 1 capsule (180 mg total) by mouth daily., Disp: , Rfl:    docusate sodium (COLACE) 100 MG capsule, Take 1 capsule (100 mg total) by mouth 2 (two) times daily., Disp: 10 capsule, Rfl: 0   levETIRAcetam (KEPPRA) 500 MG tablet, Take 1 tablet (500 mg total) by mouth 2 (two) times daily., Disp: 60 tablet, Rfl: 0   Multiple Vitamin (MULTIVITAMIN WITH MINERALS) TABS tablet, Take 1 tablet by mouth daily., Disp: , Rfl:    Nutritional Supplements (FEEDING SUPPLEMENT, NEPRO CARB STEADY,) LIQD, Take 237 mLs by mouth every evening., Disp: , Rfl:    ondansetron (ZOFRAN) 4 MG tablet, Take 1 tablet (4 mg total) by mouth every 6 (six) hours as needed for nausea., Disp: 20 tablet, Rfl: 0   polyethylene glycol (MIRALAX / GLYCOLAX) 17  g packet, Take 17 g by mouth daily as needed for moderate constipation., Disp: 14 each, Rfl: 0   Exam: Current vital signs: BP (!) 170/86   Pulse 75   Temp (!) 97.4 F (36.3 C) (Oral)   Resp 17   Wt 75.4 kg   SpO2 94%   BMI 22.54 kg/m  Vital signs in last 24 hours: Temp:  [97.4 F (36.3 C)] 97.4 F (36.3 C) (07/02 0945) Pulse Rate:  [68-75] 75 (07/02 1000) Resp:  [15-17] 17 (07/02 1000) BP: (112-179)/(85-103) 170/86 (07/02 1000) SpO2:  [94 %-97 %] 94 % (07/02 1000) Weight:  [75.4 kg] 75.4 kg (07/02 0900)  GENERAL: Awake, alert in NAD HEENT: - Normocephalic and atraumatic,  dry mm, no LN++, no Thyromegally LUNGS - Clear to auscultation bilaterally with no wheezes CV - S1S2 RRR, no m/r/g, equal pulses bilaterally. ABDOMEN - Soft, nontender, nondistended with normoactive BS Ext: warm, well perfused, intact peripheral pulses, no edema  NEURO:  Mental Status: Awake, states name, unable to state month Language: speech is clear.  Naming, repetition, fluency, and comprehension intact. Cranial Nerves: PERRL, EOMI, visual fields full, left facial droop, facial sensation intact, hearing intact, tongue/uvula/soft palate midline, normal sternocleidomastoid and trapezius muscle strength. No evidence of tongue atrophy or fasciculations Motor:  RUE 5/5 LUE 3/5 RLE 5/5 LLE 4/5 Tone: is normal and bulk is normal Sensation- sensation diminished on the left Coordination: No ataxia out of proportion to weakness Gait- deferred  NIHSS 1a Level of Conscious.: 0 1b LOC Questions: 1 1c LOC Commands: 0 2 Best Gaze: 0 3 Visual: 0 4 Facial Palsy: 2 5a Motor Arm - left: 2 5b Motor Arm - Right: 0 6a Motor Leg - Left: 1 6b Motor Leg - Right: 0 7 Limb Ataxia: 0 8 Sensory: 1 9 Best Language: 0 10 Dysarthria: 0 11 Extinct. and Inatten.: 0 TOTAL: 7   Labs I have reviewed labs in epic and the results pertinent to this consultation are:  CBC    Component Value Date/Time   WBC 5.6 02/18/2023 0900   RBC 3.53 (L) 02/18/2023 0900   HGB 11.9 (L) 02/18/2023 0903   HCT 35.0 (L) 02/18/2023 0903   PLT 196 02/18/2023 0900   MCV 98.0 02/18/2023 0900   MCH 31.4 02/18/2023 0900   MCHC 32.1 02/18/2023 0900   RDW 15.7 (H) 02/18/2023 0900   LYMPHSABS 1.2 02/18/2023 0900   MONOABS 0.6 02/18/2023 0900   EOSABS 0.1 02/18/2023 0900   BASOSABS 0.0 02/18/2023 0900    CMP     Component Value Date/Time   NA 136 02/18/2023 0903   K 3.9 02/18/2023 0903   CL 97 (L) 02/18/2023 0903   CO2 22 02/18/2023 0900   GLUCOSE 116 (H) 02/18/2023 0903   BUN 38 (H) 02/18/2023 0903   CREATININE  13.90 (H) 02/18/2023 0903   CALCIUM 9.2 02/18/2023 0900   PROT 6.8 02/18/2023 0900   ALBUMIN 3.5 02/18/2023 0900   AST 18 02/18/2023 0900   ALT 13 02/18/2023 0900   ALKPHOS 87 02/18/2023 0900   BILITOT 0.7 02/18/2023 0900   GFRNONAA 4 (L) 02/18/2023 0900    Lipid Panel     Component Value Date/Time   CHOL 191 03/11/2021 1022   TRIG 103 03/11/2021 1022   HDL 45 03/11/2021 1022   CHOLHDL 4.2 03/11/2021 1022   VLDL 21 03/11/2021 1022   LDLCALC 125 (H) 03/11/2021 1022     Imaging I have reviewed the images obtained:  CT-head- 1.  No evidence of acute intracranial abnormality. ASPECTS is 10. 2. Multiple remote infarcts. An MRI could provide more sensitive evaluation for acute on chronic peri-infarct ischemia if clinically warranted.  CT Angio Head and Neck 1. No emergent large vessel occlusion. 2. 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 3. Moderate proximal left V2 vertebral artery stenosis. 4. Potentially significant stenosis of the right vertebral artery origins; however, evaluation is significantly limited due to streak artifact. 5. Aortic Atherosclerosis (ICD10-I70.0) and Emphysema (ICD10-J43.9).  MRI examination of the brain 1. Two acute infarcts within the right corona radiata measuring up to 13 mm. 2. Background parenchymal atrophy, chronic small vessel ischemic disease and chronic infarcts, as detailed and stable from the prior brain MRI of 07/31/2022. 3. As before, there are scattered chronic microhemorrhages within the supratentorial and infratentorial brain. Findings likely at least partly reflect sequelae of chronic hypertensive microangiopathy. However, a superimposed component of cerebral amyloid angiopathy cannot be excluded.  Assessment:  72 y.o. male with a past medical history of PAF on eliquis, seizure history- discharged 08/03/2022 on 500mg  keppra BID, HTN, ESRD on HD (fills medications through [place] in [place]) presenting  with left side weakness, left facial droop, confusion. He has had a previous work up for seizure and was previously on keppra 500mg  BID. This was discontinued after 6 months. He is not taking his eliquis daily. Seizure activity noted with left arm twitching and left gaze in CT. Given 2mg  of ativan and 1500mg  of keppra.   Impression: Acute ischemic infarct  Seizure   Recommendations: - HgbA1c, fasting lipid panel - The following imaging if indicated  - MRI of the brain with and without contrast - Echocardiogram - Carotid dopplers - Continue Eliquis  - Risk factor modification - Telemetry monitoring - PT consult, OT consult, Speech consult - Stroke team to follow  - Keppra 1500 load, then 500 mg BID. Adjust as needed for renal function:  Estimated Creatinine Clearance: 5.3 mL/min (A) (by C-G formula based on SCr of 13.9 mg/dL (H)).     Patient seen and examined by NP/APP with MD. MD to update note as needed.   Elmer Picker, DNP, FNP-BC [facility] Pager: [phone]    Attending Neurohospitalist Addendum Patient seen and examined with APP/Resident. Agree with the history and physical as documented above. Agree with the plan as documented, which I helped formulate. I have edited the note above to reflect my full findings and recommendations. I have independently reviewed the chart, obtained history, review of systems and examined the patient.I have personally reviewed pertinent head/neck/spine imaging (CT/MRI). Please feel free to call with any questions.  Patient reports he was taking his eliquis every other day therefore etiology of stroke is favored to be cardioembolic. Given the small size of his ischemic infarct, OK to continue eliquis 2/2 low risk of hemorrhagic conversion.  -- Bing Neighbors, MD [facility] [phone]  If 7pm- 7am, please page neurology on call as listed in AMION.

## 2023-02-18 NOTE — ED Triage Notes (Addendum)
PT BIB [facility] as a Code Stroke.  PT was driving to dialysis T, TH, Sat, when he began to feel bad.  He pulled over, got out of car and had a syncopal episode. EMS noted pt to be disoriented, HA, Dizziiness, L. Sided weakness and L. Sided tremors.   Pt has fistula on left.  Restricted bracelet in place.  EMS VS 198/92 100% RA, HR 70, CBG 150

## 2023-02-18 NOTE — ED Notes (Signed)
ED TO INPATIENT HANDOFF REPORT  ED Nurse Name and Phone #: Lenell Antu Name/Age/Gender Tanner Rollins 72 y.o. male Room/Bed: RESUSC/RESUSC  Code Status   Code Status: Full Code  Home/SNF/Other Home Patient oriented to:  self, place, time, location prior to sedation  Is this baseline? Yes   Triage Complete: Triage complete  Chief Complaint Cerebral infarction [facility]) [I63.9]  Triage Note PT BIB [facility] as a Code Stroke.  PT was driving to dialysis T, TH, Sat, when he began to feel bad.  He pulled over, got out of car and had a syncopal episode. EMS noted pt to be disoriented, HA, Dizziiness, L. Sided weakness and L. Sided tremors.   Pt has fistula on left.  Restricted bracelet in place.  EMS VS 198/92 100% RA, HR 70, CBG 150   Allergies No Known Allergies  Level of Care/Admitting Diagnosis ED Disposition     ED Disposition  Admit   Condition  --   Comment  Hospital Area: [facility] [[id number]]  Level of Care: Telemetry Medical [104]  May admit patient to [facility] or [facility] if equivalent level of care is available:: Yes  Covid Evaluation: Asymptomatic - no recent exposure (last 10 days) testing not required  Diagnosis: Cerebral infarction [facility]) [[id number]]  Admitting Physician: Tyson Alias [[id number]]  Attending Physician: Tyson Alias [id number]  Certification:: I certify this patient will need inpatient services for at least 2 midnights  Estimated Length of Stay: 3          B Medical/Surgery History Past Medical History:  Diagnosis Date   ESRD on hemodialysis (HCC)    [facility]   Hypertension    PAF (paroxysmal atrial fibrillation) (HCC)    Past Surgical History:  Procedure Laterality Date   ANGIOPLASTY Left 06/10/2022   Procedure: Balloon VENOPLASTY;  Surgeon: Chuck Hint, MD;  Location: [facility] OR;  Service: Vascular;  Laterality: Left;   AV FISTULA PLACEMENT Left 01/28/2022   Procedure: ARTERIOVENOUS  (AV) FISTULA CREATION VS.GRAFT ARM;  Surgeon: Chuck Hint, MD;  Location: [facility] OR;  Service: Vascular;  Laterality: Left;   FISTULOGRAM Left 06/10/2022   Procedure: FISTULOGRAM;  Surgeon: Chuck Hint, MD;  Location: [facility] OR;  Service: Vascular;  Laterality: Left;   IR FLUORO GUIDE CV LINE RIGHT  01/24/2022   IR REMOVAL TUN CV CATH W/O FL  10/04/2022   IR THORACENTESIS ASP PLEURAL SPACE W/IMG GUIDE  02/12/2022   IR US GUIDE VASC ACCESS RIGHT  01/24/2022     A IV Location/Drains/Wounds Patient Lines/Drains/Airways Status     Active Line/Drains/Airways     Name Placement date Placement time Site Days   Peripheral IV 02/18/23 18 G Anterior;Distal;Right;Upper Arm 02/18/23  0927  Arm  less than 1   Fistula / Graft Left Upper arm Arteriovenous fistula 01/28/22  --  Upper arm  386   Hemodialysis Catheter Right Internal jugular Double lumen Permanent (Tunneled) 01/24/22  1527  Internal jugular  390            Intake/Output Last 24 hours No intake or output data in the 24 hours ending 02/18/23 1351  Labs/Imaging Results for orders placed or performed during the hospital encounter of 02/18/23 (from the past 48 hour(s))  Protime-INR     Status: None   Collection Time: 02/18/23  9:00 AM  Result Value Ref Range   Prothrombin Time 14.8 11.4 - 15.2 seconds   INR 1.1 0.8 - 1.2  Comment: (NOTE) INR goal varies based on device and disease states. Performed at [facility], [address], [place], [place] [address]   APTT     Status: None   Collection Time: 02/18/23  9:00 AM  Result Value Ref Range   aPTT 33 24 - 36 seconds    Comment: Performed at [facility], [address], [place], [place] [address]  CBC     Status: Abnormal   Collection Time: 02/18/23  9:00 AM  Result Value Ref Range   WBC 5.6 4.0 - 10.5 K/uL   RBC 3.53 (L) 4.22 - 5.81 MIL/uL   Hemoglobin 11.1 (L) 13.0 - 17.0 g/dL   HCT 09.8 (L) 11.9 - 14.7 %   MCV 98.0 80.0 - 100.0 fL   MCH 31.4  26.0 - 34.0 pg   MCHC 32.1 30.0 - 36.0 g/dL   RDW 82.9 (H) 56.2 - 13.0 %   Platelets 196 150 - 400 K/uL   nRBC 0.0 0.0 - 0.2 %    Comment: Performed at [facility], [address], [place], [place] [address]  Differential     Status: None   Collection Time: 02/18/23  9:00 AM  Result Value Ref Range   Neutrophils Relative % 65 %   Neutro Abs 3.7 1.7 - 7.7 K/uL   Lymphocytes Relative 22 %   Lymphs Abs 1.2 0.7 - 4.0 K/uL   Monocytes Relative 10 %   Monocytes Absolute 0.6 0.1 - 1.0 K/uL   Eosinophils Relative 2 %   Eosinophils Absolute 0.1 0.0 - 0.5 K/uL   Basophils Relative 1 %   Basophils Absolute 0.0 0.0 - 0.1 K/uL   Immature Granulocytes 0 %   Abs Immature Granulocytes 0.01 0.00 - 0.07 K/uL    Comment: Performed at [facility], [address] [place], [place] [address]  Comprehensive metabolic panel     Status: Abnormal   Collection Time: 02/18/23  9:00 AM  Result Value Ref Range   Sodium 136 135 - 145 mmol/L   Potassium 4.0 3.5 - 5.1 mmol/L   Chloride 94 (L) 98 - 111 mmol/L   CO2 22 22 - 32 mmol/L   Glucose, Bld 120 (H) 70 - 99 mg/dL    Comment: Glucose reference range applies only to samples taken after fasting for at least 8 hours.   BUN 33 (H) 8 - 23 mg/dL   Creatinine, Ser 95.28 (H) 0.61 - 1.24 mg/dL   Calcium 9.2 8.9 - 41.3 mg/dL   Total Protein 6.8 6.5 - 8.1 g/dL   Albumin 3.5 3.5 - 5.0 g/dL   AST 18 15 - 41 U/L   ALT 13 0 - 44 U/L   Alkaline Phosphatase 87 38 - 126 U/L   Total Bilirubin 0.7 0.3 - 1.2 mg/dL   GFR, Estimated 4 (L) >60 mL/min    Comment: (NOTE) Calculated using the CKD-EPI Creatinine Equation (2021)    Anion gap 20 (H) 5 - 15    Comment: Performed at [facility], [address], [place], [place] [address]  CBG monitoring, ED     Status: Abnormal   Collection Time: 02/18/23  9:00 AM  Result Value Ref Range   Glucose-Capillary 114 (H) 70 - 99 mg/dL    Comment: Glucose reference range applies only to samples taken after  fasting for at least 8 hours.  I-stat chem 8, ED     Status: Abnormal   Collection  Time: 02/18/23  9:03 AM  Result Value Ref Range   Sodium 136 135 - 145 mmol/L   Potassium 3.9 3.5 - 5.1 mmol/L   Chloride 97 (L) 98 - 111 mmol/L   BUN 38 (H) 8 - 23 mg/dL   Creatinine, Ser 60.45 (H) 0.61 - 1.24 mg/dL   Glucose, Bld 409 (H) 70 - 99 mg/dL    Comment: Glucose reference range applies only to samples taken after fasting for at least 8 hours.   Calcium, Ion 1.03 (L) 1.15 - 1.40 mmol/L   TCO2 29 22 - 32 mmol/L   Hemoglobin 11.9 (L) 13.0 - 17.0 g/dL   HCT 81.1 (L) 91.4 - 78.2 %   MR BRAIN WO CONTRAST  Result Date: 02/18/2023 CLINICAL DATA:  Provided history: Seizure, new onset, no history of trauma. EXAM: MRI HEAD WITHOUT CONTRAST TECHNIQUE: Multiplanar, multiecho pulse sequences of the brain and surrounding structures were obtained without intravenous contrast. COMPARISON:  Noncontrast head CT and CT angiogram head/neck 02/18/2023. Brain MRI 07/31/2022 FINDINGS: Brain: Mild generalized parenchymal atrophy. Two acute infarcts within the right corona radiata measuring up to 13 mm. Chronic cortical/subcortical infarct (with associated chronic hemosiderin deposition) in the right parietal lobe. Chronic lacunar infarcts within the bilateral cerebral hemispheric white matter, deep gray nuclei and within the pons. Chronic hemosiderin deposition associated with a chronic lacunar infarct in the right basal ganglia. Background moderate multifocal T2 FLAIR hyperintense signal abnormality within the cerebral white matter, nonspecific but compatible with chronic small vessel ischemic disease. Multiple small chronic infarcts within the left cerebellar hemisphere. Chronic microhemorrhages scattered within the bilateral cerebral hemispheres, thalami, brainstem and cerebellum. No evidence of an intracranial mass No extra-axial fluid collection. No midline shift. Vascular: Maintained flow voids within the proximal large  arterial vessels. Skull and upper cervical spine: No suspicious marrow lesion. Incompletely assessed cervical spondylosis. Sinuses/Orbits: No mass or acute finding within the imaged orbits. Mild mucosal thickening versus small mucous retention cyst within the inferior left maxillary sinus. Tiny mucous retention cysts within the bilateral sphenoid sinuses. IMPRESSION: 1. Two acute infarcts within the right corona radiata measuring up to 13 mm. 2. Background parenchymal atrophy, chronic small vessel ischemic disease and chronic infarcts, as detailed and stable from the prior brain MRI of 07/31/2022. 3. As before, there are scattered chronic microhemorrhages within the supratentorial and infratentorial brain. Findings likely at least partly reflect sequelae of chronic hypertensive microangiopathy. However, a superimposed component of cerebral amyloid angiopathy cannot be excluded. Electronically Signed   By: Jackey Loge D.O.   On: 02/18/2023 11:56   CT ANGIO HEAD NECK W WO CM (CODE STROKE)  Result Date: 02/18/2023 CLINICAL DATA:  Assess intracranial arteries. EXAM: CT ANGIOGRAPHY HEAD AND NECK WITH AND WITHOUT CONTRAST TECHNIQUE: Multidetector CT imaging of the head and neck was performed using the standard protocol during bolus administration of intravenous contrast. Multiplanar CT image reconstructions and MIPs were obtained to evaluate the vascular anatomy. Carotid stenosis measurements (when applicable) are obtained utilizing NASCET criteria, using the distal internal carotid diameter as the denominator. RADIATION DOSE REDUCTION: This exam was performed according to the departmental dose-optimization program which includes automated exposure control, adjustment of the mA and/or kV according to patient size and/or use of iterative reconstruction technique. CONTRAST:  75mL OMNIPAQUE IOHEXOL 350 MG/ML SOLN COMPARISON:  Same day CT head.  CTA head/neck 07/30/2022. FINDINGS: CTA NECK FINDINGS Aortic arch: Great  vessel origins are patent without significant stenosis. Aortic atherosclerosis. Right carotid system: Atherosclerosis at the carotid bifurcation  and involving the proximal ICA without greater than 50% stenosis. Left carotid system: Atherosclerosis at the carotid bifurcation without greater than 50% stenosis. Vertebral arteries: Potentially significant stenosis of the right vertebral artery origins; however, evaluation is significantly limited due to streak artifact. Moderate stenosis of the proximal left V2 vertebral artery. Skeleton: No evidence of acute abnormality on limited assessment. Other neck: No acute abnormality on limited assessment. Upper chest: Visualized lung apices are clear. Review of the MIP images confirms the above findings CTA HEAD FINDINGS Anterior circulation: Bilateral intracranial ICAs, MCAs and ACAs are patent Posterior circulation: Severe stenosis of the intradural right vertebral artery. This vertebral artery is non dominant/small and terminates as PICA, anatomic variant. The left intradural vertebral artery, basilar artery and bilateral posterior cerebral arteries are patent. Left fetal type PCA. Venous sinuses: As permitted by contrast timing, patent. Anatomic variants: Detailed above. Review of the MIP images confirms the above findings IMPRESSION: 1. No emergent large vessel occlusion. 2. 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 3. Moderate proximal left V2 vertebral artery stenosis. 4. Potentially significant stenosis of the right vertebral artery origins; however, evaluation is significantly limited due to streak artifact. 5. Aortic Atherosclerosis (ICD10-I70.0) and Emphysema (ICD10-J43.9). Electronically Signed   By: Feliberto Harts M.D.   On: 02/18/2023 09:40   CT HEAD CODE STROKE WO CONTRAST  Result Date: 02/18/2023 CLINICAL DATA:  Code stroke.  Neuro deficit, acute, stroke suspected EXAM: CT HEAD WITHOUT CONTRAST TECHNIQUE: Contiguous  axial images were obtained from the base of the skull through the vertex without intravenous contrast. RADIATION DOSE REDUCTION: This exam was performed according to the departmental dose-optimization program which includes automated exposure control, adjustment of the mA and/or kV according to patient size and/or use of iterative reconstruction technique. COMPARISON:  CT head 07/30/2022. FINDINGS: Brain: Similar appearance of remote infarcts in the right parietal lobe, basal ganglia and right frontal white matter. No evidence of acute large vascular territory infarct, acute hemorrhage, mass lesion, hydrocephalus, or midline shift. Vascular: No hyperdense vessel identified. Skull: No acute fracture. Sinuses/Orbits: Mostly clear sinuses. Left maxillary sinus retention cyst. Other: No mastoid effusions. ASPECTS [facility] Stroke Program Early CT Score) total score (0-10 with 10 being normal): 10. IMPRESSION: 1. No evidence of acute intracranial abnormality. ASPECTS is 10. 2. Multiple remote infarcts. An MRI could provide more sensitive evaluation for acute on chronic peri-infarct ischemia if clinically warranted. Code stroke imaging results were communicated on 02/18/2023 at 9:19 am to provider Dr. Selina Cooley Via secure text paging. Electronically Signed   By: Feliberto Harts M.D.   On: 02/18/2023 09:20    Pending Labs Unresulted Labs (From admission, onward)     Start     Ordered   02/19/23 0500  CBC  Tomorrow morning,   R        02/18/23 1322   02/19/23 0500  Renal function panel  Tomorrow morning,   R        02/18/23 1322   02/18/23 0900  Ethanol  (Stroke Panel (PNL))  Once,   URGENT        02/18/23 0859            Vitals/Pain Today's Vitals   02/18/23 1145 02/18/23 1200 02/18/23 1215 02/18/23 1238  BP: (!) 176/102 (!) 163/92 (!) 158/83   Pulse: 83 66 64   Resp: (!) 25 18 16    Temp:      TempSrc:      SpO2: 95% 95% 96%  Weight:      PainSc:    Asleep    Isolation Precautions No active  isolations  Medications Medications  sodium chloride flush (NS) 0.9 % injection 3 mL (has no administration in time range)  LORazepam (ATIVAN) 2 MG/ML injection (  Not Given 02/18/23 0912)  levETIRAcetam (KEPPRA) IVPB 500 mg/100 mL premix (has no administration in time range)  atorvastatin (LIPITOR) tablet 20 mg (has no administration in time range)  acetaminophen (TYLENOL) tablet 650 mg (has no administration in time range)    Or  acetaminophen (TYLENOL) suppository 650 mg (has no administration in time range)  senna-docusate (Senokot-S) tablet 1 tablet (has no administration in time range)  apixaban (ELIQUIS) tablet 5 mg (has no administration in time range)  levETIRAcetam (KEPPRA) IVPB 1500 mg/ 100 mL premix (0 mg Intravenous Stopped 02/18/23 0941)  LORazepam (ATIVAN) injection 2 mg (2 mg Intravenous Given 02/18/23 0912)  iohexol (OMNIPAQUE) 350 MG/ML injection 75 mL (75 mLs Intravenous Contrast Given 02/18/23 0919)    Mobility walks     Focused Assessments Neuro Assessment Handoff:  Swallow screen pass?  Has not been awake enough since ativan to perform effective neuro checks or swallow screen.    NIH Stroke Scale  Dizziness Present: No Headache Present: Yes Interval: Other (Comment) (very lethargic from ativan.) Level of Consciousness (1a.)   : Not alert, requires repeated stimulation to attend, or is obtunded and requires strong or painful stimulation to make movements (not stereotyped) (a little more responsive to pain but still very lethargic from Ativan) LOC Questions (1b. )   : Answers neither question correctly (a little more responsive to pain but still very lethargic from Ativan) LOC Commands (1c. )   : Performs neither task correctly (a little more responsive to pain but still very lethargic from Ativan) Best Gaze (2. )  : Normal (a little more responsive to pain but still very lethargic from Ativan) Visual (3. )  : No visual loss (a little more responsive to pain but still  very lethargic from Ativan) Facial Palsy (4. )    : Normal symmetrical movements Motor Arm, Left (5a. )   : Amputation or joint fusion (a little more responsive to pain but still very lethargic from Ativan) Motor Arm, Right (5b. ) : Amputation or joint fusion (a little more responsive to pain but still very lethargic from Ativan) Motor Leg, Left (6a. )  : Amputation or joint fusion (a little more responsive to pain but still very lethargic from Ativan) Motor Leg, Right (6b. ) : Amputation or joint fusion (a little more responsive to pain but still very lethargic from Ativan) Limb Ataxia (7. ): Amputation or joint fusion (a little more responsive to pain but still very lethargic from Ativan) Sensory (8. )  : Normal, no sensory loss (a little more responsive to pain but still very lethargic from Ativan) Best Language (9. )  : No aphasia (a little more responsive to pain but still very lethargic from Ativan) Dysarthria (10. ): Normal (a little more responsive to pain but still very lethargic from Ativan) Extinction/Inattention (11.)   : No Abnormality (a little more responsive to pain but still very lethargic from Ativan) Complete NIHSS TOTAL: 6 Last date known well: 02/18/23 Last time known well: 0820 Neuro Assessment: Exceptions to [facility] Neuro Checks:   Initial (02/18/23 0920)  Has TPA been given? No If patient is a Neuro Trauma and patient is going to OR before floor call report to [employer]  nurse: [phone] or [phone]   R Recommendations: See Admitting Provider Note  Report given to:   Additional Notes:

## 2023-02-18 NOTE — H&P (Addendum)
Date: 02/18/2023               Patient Name:  Tanner Rollins MRN: [id number]  DOB: [birth year] Age / Sex: 72 y.o., male   PCP: Pcp, No         Medical Service: Internal Medicine Teaching Service         Attending Physician: Dr. Oswaldo Done, Marquita Palms, *    First Contact: Dr. Versie Starks Pager: [phone]  Second Contact: Dr. Ned Card Pager: [phone]       After Hours (After 5p/  First Contact Pager: [phone]  weekends / holidays): Second Contact Pager: [phone]   Chief Complaint:   History of Present Illness:  Pt is a 72 year old man with PMHx of Seizure disorder c/b status epilepticus, HTN, ESRD on HD, PAF on [facility] who presented to the ED via EMS after weakness and a syncopal episode. Pt had a seizure en-route to the ED and repeat seizure activity in the ED. Unfortunately pt was sedated. Per nurse report, he was interactive and following all commands easily before ativan was given. On my evaluation, pt responds to sternal rub but not voice. After responding to the sternal rub, he returns to sleep.   On chart review, pt had ataxia, limb jerking and left sided gaze in 07/2022 and stroke was suspected. Imaging was negative and pt was found to have seizure disorder. He was discharged with keppra but discontinued it few weeks later.  Review of Systems negative unless stated in the HPI but is limited to sedation.   In the ED, neurology was consulted for stroke evaluation. Pt was given 2 mg IV ativan and loaded with Keppra after the seizure episode. IMTS was consulted for admission.   Past Medical History: Past Medical History:  Diagnosis Date   ESRD on hemodialysis (HCC)    [facility]   Hypertension    PAF (paroxysmal atrial fibrillation) (HCC)     Meds:  Home meds from chart review include: Amiodarone 200 mg every day Eliquis 5 mg BID Lipitor 20 mg every day Coreg 25 mg every day Diltiazem 180 mg ER Clonidine 0.1 mg BID  Calcitriol 0.25 mcg capsule Phos Binder 667 mg  Sevelamer  800 mg   Allergies: NKDA   Past Surgical History: Past Surgical History:  Procedure Laterality Date   ANGIOPLASTY Left 06/10/2022   Procedure: Balloon VENOPLASTY;  Surgeon: Chuck Hint, MD;  Location: [facility] OR;  Service: Vascular;  Laterality: Left;   AV FISTULA PLACEMENT Left 01/28/2022   Procedure: ARTERIOVENOUS (AV) FISTULA CREATION VS.GRAFT ARM;  Surgeon: Chuck Hint, MD;  Location: [facility] OR;  Service: Vascular;  Laterality: Left;   FISTULOGRAM Left 06/10/2022   Procedure: FISTULOGRAM;  Surgeon: Chuck Hint, MD;  Location: [facility] OR;  Service: Vascular;  Laterality: Left;   IR FLUORO GUIDE CV LINE RIGHT  01/24/2022   IR REMOVAL TUN CV CATH W/O FL  10/04/2022   IR THORACENTESIS ASP PLEURAL SPACE W/IMG GUIDE  02/12/2022   IR US GUIDE VASC ACCESS RIGHT  01/24/2022    Family History:  Unable to update hx given pt is sedated. Per chart review pt's family hx includes HTN in mother, father, and brother. Hx of CKD in mother, and hx of arterial aneurysm in sister.   Social History:  Former smoker. Last smoked 32 years ago. Can do all ADLs and iADLs. Has denied alcohol use or other substance use but chart review shows intermittent marijuana use.   Physical Exam:  Blood pressure (!) 158/83, pulse 64, temperature (!) 97.4 F (36.3 C), temperature source Oral, resp. rate 16, weight 75.4 kg, SpO2 96 %. General: NAD,  HENT: left sided facial droop, eyes equal and reactive Lungs: CTAB Cardiovascular: NSR, good pulses in all extremities Abdomen:Normal bowel sounds, soft abdomen MSK: good bulk and tone in all muscles, no asymmetry Skin: no lesion noted on the skin Neuro: unable to perform neuro exam given pt is sedated 2/2 to ativan Psych: unable to assess  Diagnostics:     Latest Ref Rng & Units 02/18/2023    9:03 AM 02/18/2023    9:00 AM 08/03/2022    3:47 AM  CBC  WBC 4.0 - 10.5 K/uL  5.6  5.0   Hemoglobin 13.0 - 17.0 g/dL 11.9  14.7  82.9   Hematocrit 39.0 - 52.0 %  35.0  34.6  37.5   Platelets 150 - 400 K/uL  196  168        Latest Ref Rng & Units 02/18/2023    9:03 AM 02/18/2023    9:00 AM 08/03/2022    3:47 AM  CMP  Glucose 70 - 99 mg/dL 562  130  90   BUN 8 - 23 mg/dL 38  33  38   Creatinine 0.61 - 1.24 mg/dL 86.57  84.69  62.95   Sodium 135 - 145 mmol/L 136  136  133   Potassium 3.5 - 5.1 mmol/L 3.9  4.0  4.7   Chloride 98 - 111 mmol/L 97  94  91   CO2 22 - 32 mmol/L  22  25   Calcium 8.9 - 10.3 mg/dL  9.2  28.4   Total Protein 6.5 - 8.1 g/dL  6.8  7.4   Total Bilirubin 0.3 - 1.2 mg/dL  0.7  0.5   Alkaline Phos 38 - 126 U/L  87  75   AST 15 - 41 U/L  18  16   ALT 0 - 44 U/L  13  9     MR BRAIN WO CONTRAST  Result Date: 02/18/2023 CLINICAL DATA:  Provided history: Seizure, new onset, no history of trauma. EXAM: MRI HEAD WITHOUT CONTRAST TECHNIQUE:  IMPRESSION: 1. Two acute infarcts within the right corona radiata measuring up to 13 mm. 2. Background parenchymal atrophy, chronic small vessel ischemic disease and chronic infarcts, as detailed and stable from the prior brain MRI of 07/31/2022. 3. As before, there are scattered chronic microhemorrhages within the supratentorial and infratentorial brain. Findings likely at least partly reflect sequelae of chronic hypertensive microangiopathy. However, a superimposed component of cerebral amyloid angiopathy cannot be excluded. Electronically Signed   By: Jackey Loge D.O.   On: 02/18/2023 11:56   CT ANGIO HEAD NECK W WO CM (CODE STROKE)  Result Date: 02/18/2023 CLINICAL DATA:  Assess intracranial arteries. EXAM: CT ANGIOGRAPHY HEAD AND NECK WITH AND WITHOUT CONTRAST TECHNIQUE: IMPRESSION: 1. No emergent large vessel occlusion. 2. 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 3. Moderate proximal left V2 vertebral artery stenosis. 4. Potentially significant stenosis of the right vertebral artery origins; however, evaluation is significantly limited due to streak artifact. 5.  Aortic Atherosclerosis (ICD10-I70.0) and Emphysema (ICD10-J43.9). Electronically Signed   By: Feliberto Harts M.D.   On: 02/18/2023 09:40   CT HEAD CODE STROKE WO CONTRAST  Result Date: 02/18/2023 CLINICAL DATA:  Code stroke.  Neuro deficit, acute, stroke suspected EXAM: CT HEAD WITHOUT CONTRAST TECHNIQUE IMPRESSION: 1. No evidence of acute intracranial abnormality.  ASPECTS is 10. 2. Multiple remote infarcts. An MRI could provide more sensitive evaluation for acute on chronic peri-infarct ischemia if clinically warranted. Code stroke imaging results were communicated on 02/18/2023 at 9:19 am to provider Dr. Selina Cooley Via secure text paging. Electronically Signed   By: Feliberto Harts M.D.   On: 02/18/2023 09:20     EKG: personally reviewed my interpretation is sinus rhythm, with first degree AV block. LVH noted on EKG.   Assessment & Plan by Problem:  Present on Admission:  Cerebral infarction [facility])   Cerebral Infarction Pt with multiple risk factors include HTN, HLD, multiple intracranial stenosis, ESRD on HD with left sided weakness and syncope found to have a acute infarct on imaging. Neurology consulted by Dr. Jeraldine Loots and are following pt. Appreciate their assistance.  Current work up for stroke is pending including: echo with bubble study, lab work including A1c and lipid panel. Neuro team following.  -Admitted to med-tele -Will allow for permissive HTN given intracranial stenosis -PT/OT consulted.  -Pt NPO given left facial droop, SLP consulted  Seizure Disorder Pt with hx of seizure disorder on keppra. Per pt's sister, pt was told to stop Keppra about 6 months ago. He was only on keppra for 4 weeks per his sister. Multiple episodes today. Pt is status post ativan and keppra load. Will start 500 mg Keppra BID and additional 250 mg after HD session.   Hypertension Chronic. Home meds include Coreg, diltiazem, Clonidine. Will hold antihypertensives. Pt was on clonidine so he can have  rebound hypertension from its discontinuation. Will restart this medication first.   PAF Currently in sinus rhythm. Home meds include Coreg, diltiazem, amiodarone and eliquis. Plan to continue amiodarone and eliquis while here. Will restart when able from BP standpoint.   ESRD on HD Anemia of Chronic Disease Pt with ESRD on HD TThSat. Not volume overloaded and electrolytes stable. No urgent need for dialysis. Will hold off on dialysis given it can precipitate acute hypotensive state. Hgb >10.   DVT prophx: Eliqius Diet:NPO Bowel: PRN Code:Full Family: Notified sister 02/18/23  Prior to Admission Living Arrangement: Home Anticipated Discharge Location: Home Barriers to Discharge: Medical Workup  Dispo: Admit patient to Inpatient with expected length of stay greater than 2 midnights.  Gwenevere Abbot, MD Eligha Bridegroom. [facility] Internal Medicine Residency, PGY-3 Pager: [phone]

## 2023-02-18 NOTE — ED Notes (Signed)
Pt is sleeping, post ativan, with occasional twitch at this time.

## 2023-02-18 NOTE — ED Provider Notes (Signed)
EMERGENCY DEPARTMENT AT [facility] Provider Note   CSN: [id number] Arrival date & time: 02/18/23  4782     History  No chief complaint on file.   Tanner Rollins is a 72 y.o. male.  HPI Presents as a code stroke.  Patient was in his usual state of health, going to dialysis when he felt weak.  He was able to pull to the side, went to a store for assistance, had an episode of syncope.  Thereafter the patient noticed difficulty with left arm motion, and EMS was called. EMS reports that en route the patient had left arm flaccidity, intermittent right gaze deviation, but was hemodynamically unremarkable. Level 5 caveat secondary to acuity of condition.    Home Medications Prior to Admission medications   Medication Sig Start Date End Date Taking? Authorizing Provider  amiodarone (PACERONE) 200 MG tablet Take 1 tablet (200 mg total) by mouth daily. 02/23/22 08/01/23  Jerald Kief, MD  apixaban (ELIQUIS) 5 MG TABS tablet Take 1 tablet (5 mg total) by mouth 2 (two) times daily. 01/31/22   Burnadette Pop, MD  atorvastatin (LIPITOR) 40 MG tablet Take 20 mg by mouth daily.    [provider]  calcium acetate (PHOSLO) 667 MG capsule Take 2 capsules (1,334 mg total) by mouth 3 (three) times daily with meals. 01/31/22   Burnadette Pop, MD  cloNIDine (CATAPRES) 0.1 MG tablet Take 0.1 mg by mouth 2 (two) times daily.    [provider]  diltiazem (CARDIZEM CD) 180 MG 24 hr capsule Take 1 capsule (180 mg total) by mouth daily. 02/01/22   Burnadette Pop, MD  docusate sodium (COLACE) 100 MG capsule Take 1 capsule (100 mg total) by mouth 2 (two) times daily. 01/31/22   Burnadette Pop, MD  levETIRAcetam (KEPPRA) 500 MG tablet Take 1 tablet (500 mg total) by mouth 2 (two) times daily. 08/03/22   Marguerita Merles Latif, DO  Multiple Vitamin (MULTIVITAMIN WITH MINERALS) TABS tablet Take 1 tablet by mouth daily.    [provider]  Nutritional Supplements  (FEEDING SUPPLEMENT, NEPRO CARB STEADY,) LIQD Take 237 mLs by mouth every evening.    [provider]  ondansetron (ZOFRAN) 4 MG tablet Take 1 tablet (4 mg total) by mouth every 6 (six) hours as needed for nausea. 08/03/22   Sheikh, Kateri Mc Latif, DO  polyethylene glycol (MIRALAX / GLYCOLAX) 17 g packet Take 17 g by mouth daily as needed for moderate constipation. 01/31/22   Burnadette Pop, MD      Allergies    Patient has no known allergies.    Review of Systems   Review of Systems  Unable to perform ROS: Acuity of condition    Physical Exam Updated Vital Signs BP (!) 184/89   Pulse 69   Temp (!) 97.4 F (36.3 C) (Oral)   Resp 13   Wt 75.4 kg   SpO2 93%   BMI 22.54 kg/m  Physical Exam Vitals and nursing note reviewed.  Constitutional:      General: He is not in acute distress.    Appearance: He is well-developed.  HENT:     Head: Normocephalic and atraumatic.  Eyes:     Conjunctiva/sclera: Conjunctivae normal.  Cardiovascular:     Rate and Rhythm: Normal rate and regular rhythm.  Pulmonary:     Effort: Pulmonary effort is normal. No respiratory distress.     Breath sounds: No stridor.  Abdominal:     General: There is no distension.  Skin:    General: Skin is warm and dry.  Neurological:     Mental Status: He is alert.     Comments: Speech is slightly delayed, but not obviously abnormal.  Brief responses seem appropriate. Left facial loss of tone. Left arm flaccid. Left leg 4/5 strength.  Right extremities unremarkable.     ED Results / Procedures / Treatments   Labs (all labs ordered are listed, but only abnormal results are displayed) Labs Reviewed  CBC - Abnormal; Notable for the following components:      Result Value   RBC 3.53 (*)    Hemoglobin 11.1 (*)    HCT 34.6 (*)    RDW 15.7 (*)    All other components within normal limits  COMPREHENSIVE METABOLIC PANEL - Abnormal; Notable for the following components:   Chloride 94 (*)    Glucose,  Bld 120 (*)    BUN 33 (*)    Creatinine, Ser 12.75 (*)    GFR, Estimated 4 (*)    Anion gap 20 (*)    All other components within normal limits  I-STAT CHEM 8, ED - Abnormal; Notable for the following components:   Chloride 97 (*)    BUN 38 (*)    Creatinine, Ser 13.90 (*)    Glucose, Bld 116 (*)    Calcium, Ion 1.03 (*)    Hemoglobin 11.9 (*)    HCT 35.0 (*)    All other components within normal limits  CBG MONITORING, ED - Abnormal; Notable for the following components:   Glucose-Capillary 114 (*)    All other components within normal limits  PROTIME-INR  APTT  DIFFERENTIAL  ETHANOL    EKG EKG Interpretation Date/Time:  Tuesday February 18 2023 09:25:55 EDT Ventricular Rate:  73 PR Interval:  217 QRS Duration:  95 QT Interval:  439 QTC Calculation: 484 R Axis:   -50  Text Interpretation: Sinus rhythm Borderline prolonged PR interval Left atrial enlargement Left anterior fascicular block Left ventricular hypertrophy Anterior infarct, old Confirmed by Gerhard Munch [phone]) on 02/18/2023 9:36:32 AM  Radiology MR BRAIN WO CONTRAST  Result Date: 02/18/2023 CLINICAL DATA:  Provided history: Seizure, new onset, no history of trauma. EXAM: MRI HEAD WITHOUT CONTRAST TECHNIQUE: Multiplanar, multiecho pulse sequences of the brain and surrounding structures were obtained without intravenous contrast. COMPARISON:  Noncontrast head CT and CT angiogram head/neck 02/18/2023. Brain MRI 07/31/2022 FINDINGS: Brain: Mild generalized parenchymal atrophy. Two acute infarcts within the right corona radiata measuring up to 13 mm. Chronic cortical/subcortical infarct (with associated chronic hemosiderin deposition) in the right parietal lobe. Chronic lacunar infarcts within the bilateral cerebral hemispheric white matter, deep gray nuclei and within the pons. Chronic hemosiderin deposition associated with a chronic lacunar infarct in the right basal ganglia. Background moderate multifocal T2 FLAIR  hyperintense signal abnormality within the cerebral white matter, nonspecific but compatible with chronic small vessel ischemic disease. Multiple small chronic infarcts within the left cerebellar hemisphere. Chronic microhemorrhages scattered within the bilateral cerebral hemispheres, thalami, brainstem and cerebellum. No evidence of an intracranial mass No extra-axial fluid collection. No midline shift. Vascular: Maintained flow voids within the proximal large arterial vessels. Skull and upper cervical spine: No suspicious marrow lesion. Incompletely assessed cervical spondylosis. Sinuses/Orbits: No mass or acute finding within the imaged orbits. Mild mucosal thickening versus small mucous retention cyst within the inferior left maxillary sinus. Tiny mucous retention cysts within the bilateral sphenoid sinuses. IMPRESSION: 1. Two acute infarcts within the right corona radiata measuring up  to 13 mm. 2. Background parenchymal atrophy, chronic small vessel ischemic disease and chronic infarcts, as detailed and stable from the prior brain MRI of 07/31/2022. 3. As before, there are scattered chronic microhemorrhages within the supratentorial and infratentorial brain. Findings likely at least partly reflect sequelae of chronic hypertensive microangiopathy. However, a superimposed component of cerebral amyloid angiopathy cannot be excluded. Electronically Signed   By: Jackey Loge D.O.   On: 02/18/2023 11:56   CT ANGIO HEAD NECK W WO CM (CODE STROKE)  Result Date: 02/18/2023 CLINICAL DATA:  Assess intracranial arteries. EXAM: CT ANGIOGRAPHY HEAD AND NECK WITH AND WITHOUT CONTRAST TECHNIQUE: Multidetector CT imaging of the head and neck was performed using the standard protocol during bolus administration of intravenous contrast. Multiplanar CT image reconstructions and MIPs were obtained to evaluate the vascular anatomy. Carotid stenosis measurements (when applicable) are obtained utilizing NASCET criteria, using the  distal internal carotid diameter as the denominator. RADIATION DOSE REDUCTION: This exam was performed according to the departmental dose-optimization program which includes automated exposure control, adjustment of the mA and/or kV according to patient size and/or use of iterative reconstruction technique. CONTRAST:  75mL OMNIPAQUE IOHEXOL 350 MG/ML SOLN COMPARISON:  Same day CT head.  CTA head/neck 07/30/2022. FINDINGS: CTA NECK FINDINGS Aortic arch: Great vessel origins are patent without significant stenosis. Aortic atherosclerosis. Right carotid system: Atherosclerosis at the carotid bifurcation and involving the proximal ICA without greater than 50% stenosis. Left carotid system: Atherosclerosis at the carotid bifurcation without greater than 50% stenosis. Vertebral arteries: Potentially significant stenosis of the right vertebral artery origins; however, evaluation is significantly limited due to streak artifact. Moderate stenosis of the proximal left V2 vertebral artery. Skeleton: No evidence of acute abnormality on limited assessment. Other neck: No acute abnormality on limited assessment. Upper chest: Visualized lung apices are clear. Review of the MIP images confirms the above findings CTA HEAD FINDINGS Anterior circulation: Bilateral intracranial ICAs, MCAs and ACAs are patent Posterior circulation: Severe stenosis of the intradural right vertebral artery. This vertebral artery is non dominant/small and terminates as PICA, anatomic variant. The left intradural vertebral artery, basilar artery and bilateral posterior cerebral arteries are patent. Left fetal type PCA. Venous sinuses: As permitted by contrast timing, patent. Anatomic variants: Detailed above. Review of the MIP images confirms the above findings IMPRESSION: 1. No emergent large vessel occlusion. 2. 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 3. Moderate proximal left V2 vertebral artery stenosis. 4.  Potentially significant stenosis of the right vertebral artery origins; however, evaluation is significantly limited due to streak artifact. 5. Aortic Atherosclerosis (ICD10-I70.0) and Emphysema (ICD10-J43.9). Electronically Signed   By: Feliberto Harts M.D.   On: 02/18/2023 09:40   CT HEAD CODE STROKE WO CONTRAST  Result Date: 02/18/2023 CLINICAL DATA:  Code stroke.  Neuro deficit, acute, stroke suspected EXAM: CT HEAD WITHOUT CONTRAST TECHNIQUE: Contiguous axial images were obtained from the base of the skull through the vertex without intravenous contrast. RADIATION DOSE REDUCTION: This exam was performed according to the departmental dose-optimization program which includes automated exposure control, adjustment of the mA and/or kV according to patient size and/or use of iterative reconstruction technique. COMPARISON:  CT head 07/30/2022. FINDINGS: Brain: Similar appearance of remote infarcts in the right parietal lobe, basal ganglia and right frontal white matter. No evidence of acute large vascular territory infarct, acute hemorrhage, mass lesion, hydrocephalus, or midline shift. Vascular: No hyperdense vessel identified. Skull: No acute fracture. Sinuses/Orbits: Mostly clear sinuses. Left maxillary sinus  retention cyst. Other: No mastoid effusions. ASPECTS [facility] Stroke Program Early CT Score) total score (0-10 with 10 being normal): 10. IMPRESSION: 1. No evidence of acute intracranial abnormality. ASPECTS is 10. 2. Multiple remote infarcts. An MRI could provide more sensitive evaluation for acute on chronic peri-infarct ischemia if clinically warranted. Code stroke imaging results were communicated on 02/18/2023 at 9:19 am to provider Dr. Selina Cooley Via secure text paging. Electronically Signed   By: Feliberto Harts M.D.   On: 02/18/2023 09:20    Procedures Procedures    Medications Ordered in ED Medications  sodium chloride flush (NS) 0.9 % injection 3 mL (has no administration in time range)   LORazepam (ATIVAN) 2 MG/ML injection (  Not Given 02/18/23 0912)  levETIRAcetam (KEPPRA) IVPB 500 mg/100 mL premix (has no administration in time range)  levETIRAcetam (KEPPRA) IVPB 1500 mg/ 100 mL premix (0 mg Intravenous Stopped 02/18/23 0941)  LORazepam (ATIVAN) injection 2 mg (2 mg Intravenous Given 02/18/23 0912)  iohexol (OMNIPAQUE) 350 MG/ML injection 75 mL (75 mLs Intravenous Contrast Given 02/18/23 0919)    ED Course/ Medical Decision Making/ A&P                             Medical Decision Making Patient with history of ESRD, A-fib, presents with new focal neurochanges.  Differential includes stroke versus Todd's paralysis following what may have been seizure rather than syncope, or electrolyte abnormalities, or mass.  Patient had expeditious evaluation, conducted with our neurology colleagues.   Amount and/or Complexity of Data Reviewed Independent Historian:     Details: Patient's sister arrived after the patient.  She notes that the patient was in his usual state of health yesterday when she spoke with him in Friday, 4 days ago when she saw him External Data Reviewed: notes. Labs: ordered. Decision-making details documented in ED Course. Radiology: ordered and independent interpretation performed. Decision-making details documented in ED Course. ECG/medicine tests: ordered and independent interpretation performed. Decision-making details documented in ED Course.  Risk Prescription drug management. Decision regarding hospitalization. Diagnosis or treatment significantly limited by social determinants of health.   12:21 PM Patient resting. Patient's results reviewed, discussed with our neurology colleagues again.  Patient found to have acute stroke.  Patient's labs otherwise unremarkable, consistent with known end-stage renal disease.  No evidence for bacteremia, sepsis, patient mains afebrile.  With concern for new stroke, left arm weakness, which has been somewhat intermittent,  and may be secondary to stroke versus seizure induction from new ischemic event given the patient's absence of recent use of his antiepileptic, the patient has started Keppra, as well.  Patient will be admitted for further monitoring, management.         Final Clinical Impression(s) / ED Diagnoses Final diagnoses:  Acute CVA (cerebrovascular accident) (HCC)  CRITICAL CARE Performed by: Gerhard Munch Total critical care time: 35 minutes Critical care time was exclusive of separately billable procedures and treating other patients. Critical care was necessary to treat or prevent imminent or life-threatening deterioration. Critical care was time spent personally by me on the following activities: development of treatment plan with patient and/or surrogate as well as nursing, discussions with consultants, evaluation of patient's response to treatment, examination of patient, obtaining history from patient or surrogate, ordering and performing treatments and interventions, ordering and review of laboratory studies, ordering and review of radiographic studies, pulse oximetry and re-evaluation of patient's condition.    Gerhard Munch, MD 02/18/23 1224

## 2023-02-18 NOTE — ED Notes (Signed)
Patient transported to MRI 

## 2023-02-18 NOTE — Progress Notes (Signed)
EEG complete - results pending 

## 2023-02-19 ENCOUNTER — Encounter (HOSPITAL_COMMUNITY): Payer: No Typology Code available for payment source

## 2023-02-19 ENCOUNTER — Inpatient Hospital Stay (HOSPITAL_COMMUNITY): Payer: No Typology Code available for payment source

## 2023-02-19 DIAGNOSIS — I6389 Other cerebral infarction: Secondary | ICD-10-CM

## 2023-02-19 DIAGNOSIS — I639 Cerebral infarction, unspecified: Secondary | ICD-10-CM

## 2023-02-19 DIAGNOSIS — I6300 Cerebral infarction due to thrombosis of unspecified precerebral artery: Secondary | ICD-10-CM

## 2023-02-19 DIAGNOSIS — D638 Anemia in other chronic diseases classified elsewhere: Secondary | ICD-10-CM

## 2023-02-19 DIAGNOSIS — I120 Hypertensive chronic kidney disease with stage 5 chronic kidney disease or end stage renal disease: Secondary | ICD-10-CM

## 2023-02-19 DIAGNOSIS — R569 Unspecified convulsions: Secondary | ICD-10-CM

## 2023-02-19 DIAGNOSIS — I480 Paroxysmal atrial fibrillation: Secondary | ICD-10-CM | POA: Diagnosis not present

## 2023-02-19 DIAGNOSIS — Z992 Dependence on renal dialysis: Secondary | ICD-10-CM

## 2023-02-19 DIAGNOSIS — G40909 Epilepsy, unspecified, not intractable, without status epilepticus: Secondary | ICD-10-CM

## 2023-02-19 DIAGNOSIS — Z87891 Personal history of nicotine dependence: Secondary | ICD-10-CM

## 2023-02-19 DIAGNOSIS — I63411 Cerebral infarction due to embolism of right middle cerebral artery: Secondary | ICD-10-CM

## 2023-02-19 DIAGNOSIS — N186 End stage renal disease: Secondary | ICD-10-CM

## 2023-02-19 LAB — CBC
HCT: 34.2 % — ABNORMAL LOW (ref 39.0–52.0)
Hemoglobin: 11.4 g/dL — ABNORMAL LOW (ref 13.0–17.0)
MCH: 32.2 pg (ref 26.0–34.0)
MCHC: 33.3 g/dL (ref 30.0–36.0)
MCV: 96.6 fL (ref 80.0–100.0)
Platelets: 210 10*3/uL (ref 150–400)
RBC: 3.54 MIL/uL — ABNORMAL LOW (ref 4.22–5.81)
RDW: 15.4 % (ref 11.5–15.5)
WBC: 5.2 10*3/uL (ref 4.0–10.5)
nRBC: 0 % (ref 0.0–0.2)

## 2023-02-19 LAB — ECHOCARDIOGRAM COMPLETE BUBBLE STUDY
Area-P 1/2: 4.68 cm2
S' Lateral: 2.6 cm

## 2023-02-19 LAB — HEMOGLOBIN A1C
Hgb A1c MFr Bld: 5.3 % (ref 4.8–5.6)
Mean Plasma Glucose: 105.41 mg/dL

## 2023-02-19 LAB — GLUCOSE, CAPILLARY
Glucose-Capillary: 80 mg/dL (ref 70–99)
Glucose-Capillary: 95 mg/dL (ref 70–99)
Glucose-Capillary: 85 mg/dL (ref 70–99)
Glucose-Capillary: 73 mg/dL (ref 70–99)

## 2023-02-19 LAB — RENAL FUNCTION PANEL
Albumin: 3.1 g/dL — ABNORMAL LOW (ref 3.5–5.0)
Anion gap: 17 — ABNORMAL HIGH (ref 5–15)
BUN: 39 mg/dL — ABNORMAL HIGH (ref 8–23)
CO2: 25 mmol/L (ref 22–32)
Calcium: 9.1 mg/dL (ref 8.9–10.3)
Chloride: 93 mmol/L — ABNORMAL LOW (ref 98–111)
Creatinine, Ser: 14.23 mg/dL — ABNORMAL HIGH (ref 0.61–1.24)
GFR, Estimated: 3 mL/min — ABNORMAL LOW (ref >60)
Glucose, Bld: 93 mg/dL (ref 70–99)
Phosphorus: 8 mg/dL — ABNORMAL HIGH (ref 2.5–4.6)
Potassium: 4.6 mmol/L (ref 3.5–5.1)
Sodium: 135 mmol/L (ref 135–145)

## 2023-02-19 LAB — LIPID PANEL
Cholesterol: 131 mg/dL (ref 0–200)
HDL: 29 mg/dL — ABNORMAL LOW (ref >40)
LDL Cholesterol: 68 mg/dL (ref 0–99)
Total CHOL/HDL Ratio: 4.5 RATIO
Triglycerides: 168 mg/dL — ABNORMAL HIGH (ref <150)
VLDL: 34 mg/dL (ref 0–40)

## 2023-02-19 LAB — HEPATITIS B SURFACE ANTIGEN: Hepatitis B Surface Ag: NONREACTIVE

## 2023-02-19 MED ORDER — PROSOURCE PLUS PO LIQD
30.0000 mL | Freq: Two times a day (BID) | ORAL | Status: DC
  Administered 2023-02-19: 30 mL via ORAL
  Filled 2023-02-19 (×2): qty 30

## 2023-02-19 MED ORDER — SEVELAMER CARBONATE 800 MG PO TABS
1600.0000 mg | ORAL_TABLET | Freq: Three times a day (TID) | ORAL | Status: DC
  Administered 2023-02-19 – 2023-02-21 (×7): 1600 mg via ORAL
  Filled 2023-02-19 (×7): qty 2

## 2023-02-19 MED ORDER — AMIODARONE HCL 200 MG PO TABS
200.0000 mg | ORAL_TABLET | Freq: Every day | ORAL | Status: DC
  Administered 2023-02-19 – 2023-02-21 (×3): 200 mg via ORAL
  Filled 2023-02-19 (×3): qty 1

## 2023-02-19 MED ORDER — CHLORHEXIDINE GLUCONATE CLOTH 2 % EX PADS
6.0000 | MEDICATED_PAD | Freq: Every day | CUTANEOUS | Status: DC
  Administered 2023-02-20 – 2023-02-21 (×2): 6 via TOPICAL

## 2023-02-19 NOTE — TOC Initial Note (Signed)
Transition of Care [facility]) - Initial/Assessment Note    Patient Details  Name: Tanner Rollins MRN: [id number] Date of Birth: October 02, 1950  Transition of Care [facility]) CM/SW Contact:    Kermit Balo, RN Phone Number: 02/19/2023, 10:58 AM  Clinical Narrative:                 Pt is from home alone but states he has supportive family: sister/ daughter/ cousin/ son Pt goes to [place] for P[facility]. [facility] Pt drives self as needed.  He manages his own medications and denies any issues. He uses [employer]. CIR is the recommendation. TOC following.  Expected Discharge Plan: IP Rehab Facility Barriers to Discharge: Continued Medical Work up, English as a second language teacher   Patient Goals and CMS Choice   CMS Medicare.gov Compare Post Acute Care list provided to:: Patient Choice offered to / list presented to : Patient      Expected Discharge Plan and Services   Discharge Planning Services: CM Consult Post Acute Care Choice: IP Rehab Living arrangements for the past 2 months: Apartment                                      Prior Living Arrangements/Services Living arrangements for the past 2 months: Apartment Lives with:: Self Patient language and need for interpreter reviewed:: Yes Do you feel safe going back to the place where you live?: Yes        Care giver support system in place?: Yes (comment) Current home services: DME (walker/ wheelchair/ Cpap/ oxygen) Criminal Activity/Legal Involvement Pertinent to Current Situation/Hospitalization: No - Comment as needed  Activities of Daily Living Home Assistive Devices/Equipment: Grab bars in shower, Walker (specify type) ADL Screening (condition at time of admission) Patient's cognitive ability adequate to safely complete daily activities?: Yes Is the patient deaf or have difficulty hearing?: No Does the patient have difficulty seeing, even when wearing glasses/contacts?: No Does  the patient have difficulty concentrating, remembering, or making decisions?: No Patient able to express need for assistance with ADLs?: Yes Does the patient have difficulty dressing or bathing?: No Independently performs ADLs?: Yes (appropriate for developmental age) Does the patient have difficulty walking or climbing stairs?: No Weakness of Legs: None Weakness of Arms/Hands: None  Permission Sought/Granted                  Emotional Assessment Appearance:: Appears stated age Attitude/Demeanor/Rapport: Engaged Affect (typically observed): Accepting Orientation: : Oriented to Self, Oriented to Place, Oriented to  Time, Oriented to Situation   Psych Involvement: No (comment)  Admission diagnosis:  Cerebral infarction [facility]) [I63.9] Acute CVA (cerebrovascular accident) [facility]) [I63.9] Patient Active Problem List   Diagnosis Date Noted   Cerebral infarction (HCC) 02/18/2023   Seizure disorder (HCC) 07/31/2022   ESRD (end stage renal disease) on dialysis (HCC) 02/02/2022   A-fib (HCC) 02/02/2022   Metabolic bone disease 02/02/2022   Enlarged prostate without urinary obstruction 03/12/2021   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   Severe hypertension 03/11/2021   PCP:  Pcp, No Pharmacy:   [facility] - [place], [place] - [address] [place] [address] Phone: [phone] Fax: [phone]     Social Determinants of Health (SDOH) Social History: SDOH Screenings   Food Insecurity: Patient Unable To Answer (02/18/2023)  Housing: Patient Unable To Answer (02/18/2023)  Transportation  Needs: Patient Unable To Answer (02/18/2023)  Utilities: Patient Unable To Answer (02/18/2023)  Tobacco Use: Medium Risk (02/18/2023)   SDOH Interventions:     Readmission Risk Interventions     No data to display

## 2023-02-19 NOTE — Evaluation (Signed)
Occupational Therapy Evaluation Patient Details Name: Tanner Rollins MRN: [id number] DOB: 1951/06/02 Today's Date: 02/19/2023   History of Present Illness Patient is 72 y.o. male who presented to the ED via EMS after weakness and a syncopal episode and had a seizure en-route to the ED and repeat seizure activity in the ED. MRI work-up revealed two acute infarcts within the right corona radiata as well as background parenchymal atrophy, chronic small vessel ischemic disease and chronic infarcts. PMH significant for ESRD TTS, HTN, Afib with RVR, prior respiratory failure, metabolic bone disease.   Clinical Impression   PTA pt lives in a Senior Apt complex independently, drives himself to dialysis and was an avid golfer until he became "sick" over the past year. Pt demonstrates a decline in his functional status, requiring min A with mobility and ADL @ RW level and is a high risk for falls due to below listed deficits. Patient will benefit from intensive inpatient follow up therapy, >3 hours/day. Pt very motivated to return to independent living and will have support by family after DC. Acute OT to follow. Pt very appreciative.      Recommendations for follow up therapy are one component of a multi-disciplinary discharge planning process, led by the attending physician.  Recommendations may be updated based on patient status, additional functional criteria and insurance authorization.   Assistance Recommended at Discharge Intermittent Supervision/Assistance  Patient can return home with the following A little help with walking and/or transfers;A little help with bathing/dressing/bathroom;Assistance with cooking/housework;Direct supervision/assist for medications management;Direct supervision/assist for financial management;Assist for transportation;Help with stairs or ramp for entrance    Functional Status Assessment  Patient has had a recent decline in their functional status and demonstrates the  ability to make significant improvements in function in a reasonable and predictable amount of time.  Equipment Recommendations  Tub/shower seat    Recommendations for Other Services Rehab consult     Precautions / Restrictions Precautions Precautions: Fall Restrictions Weight Bearing Restrictions: No      Mobility Bed Mobility Overal bed mobility: Needs Assistance Bed Mobility: Supine to Sit     Supine to sit: Supervision          Transfers Overall transfer level: Needs assistance Equipment used: Rolling walker (2 wheels) Transfers: Sit to/from Stand Sit to Stand: Min assist                  Balance Overall balance assessment: Needs assistance Sitting-balance support: Feet supported Sitting balance-Leahy Scale: Fair       Standing balance-Leahy Scale: Poor                             ADL either performed or assessed with clinical judgement   ADL Overall ADL's : Needs assistance/impaired Eating/Feeding: Set up   Grooming: Minimal assistance;Standing   Upper Body Bathing: Set up;Sitting   Lower Body Bathing: Minimal assistance;Sit to/from stand   Upper Body Dressing : Set up;Supervision/safety;Sitting   Lower Body Dressing: Minimal assistance;Sit to/from stand Lower Body Dressing Details (indicate cue type and reason): unaware brief was not pulled up on L Toilet Transfer: Minimal assistance;Regular Toilet;Rolling walker (2 wheels);Grab bars   Toileting- Clothing Manipulation and Hygiene: Minimal assistance       Functional mobility during ADLs: Minimal assistance;Cueing for safety;Cueing for sequencing;Rolling walker (2 wheels)       Vision Baseline Vision/History: 1 Wears glasses (reading; delayed response on field testing) Vision Assessment?: Yes Eye Alignment: Within  Functional Limits Ocular Range of Motion: Within Functional Limits Alignment/Gaze Preference: Within Defined Limits Tracking/Visual Pursuits: Able to track  stimulus in all quads without difficulty Saccades: Within functional limits Convergence: Within functional limits Visual Fields:  (will further assess)     Perception Perception Perception Tested?: Yes Perception Deficits: Inattention/neglect Inattention/Neglect:  (mild L inattention noted durin gsimulataneous stimulation; not aware at times that L hand not on RW; bumping into linen bag on L going into bathroom)   Praxis Praxis Praxis tested?: Within functional limits    Pertinent Vitals/Pain Pain Assessment Pain Assessment: Faces Faces Pain Scale: Hurts a little bit Pain Location: L hip soreness after walking Pain Descriptors / Indicators: Discomfort Pain Intervention(s): Limited activity within patient's tolerance     Hand Dominance Right   Extremity/Trunk Assessment Upper Extremity Assessment Upper Extremity Assessment: LUE deficits/detail LUE Deficits / Details: AROM WFL; strength generally 4/5 throughout; aparent coordination deficits; unaware at times that L hand was not on RW; unaware L hand was not holding toothpaste when not looking at his hand however hand was "going through movement patterns"- function improved with use of vision LUE Sensation: decreased proprioception LUE Coordination: decreased fine motor   Lower Extremity Assessment Lower Extremity Assessment: Defer to PT evaluation   Cervical / Trunk Assessment Cervical / Trunk Assessment: Normal   Communication Communication Communication: No difficulties   Cognition Arousal/Alertness: Awake/alert Behavior During Therapy: WFL for tasks assessed/performed Overall Cognitive Status: Impaired/Different from baseline Area of Impairment: Attention, Safety/judgement, Awareness, Problem solving, Memory                   Current Attention Level: Selective Memory: Decreased short-term memory   Safety/Judgement: Decreased awareness of safety, Decreased awareness of deficits Awareness: Emergent Problem  Solving: Slow processing General Comments: more difficulty with higher level cogniive tasks in dynamic activites and as pt fatigued increasing his risk of falls; Scored a 4 on the Short Blessed test, placing him close to the questionable cognitive deficit category (0-4 normal); difficulty with delayed recall     General Comments       Exercises     Shoulder Instructions      Home Living Family/patient expects to be discharged to:: Private residence Living Arrangements: Alone Available Help at Discharge: Family;Available 24 hours/day (sister checks on him 3x/day) Type of Home: Apartment Home Access: Elevator     Home Layout: One level     Bathroom Shower/Tub: Walk-in shower;Sponge bathes at baseline   [employer]: Standard Bathroom Accessibility: Yes How Accessible: Accessible via walker Home Equipment: Rolling Walker (2 wheels);Cane - single point;Wheelchair - manual;Grab bars - tub/shower;Hand held shower head (has an emergency pull cord in bathroom and bedroom)          Prior Functioning/Environment Prior Level of Function : Independent/Modified Independent;Driving;History of Falls (last six months)             Mobility Comments: reports using RW or wc "when he needs"; has fal;len a couple of times in teh past 6 months but could get up on his own          OT Problem List: Decreased strength;Impaired balance (sitting and/or standing);Decreased coordination;Decreased cognition;Decreased safety awareness;Decreased knowledge of use of DME or AE;Impaired sensation;Impaired UE functional use      OT Treatment/Interventions: Self-care/ADL training;Neuromuscular education;DME and/or AE instruction;Therapeutic activities;Cognitive remediation/compensation;Visual/perceptual remediation/compensation;Patient/family education;Balance training    OT Goals(Current goals can be found in the care plan section) Acute Rehab OT Goals Patient Stated Goal: to be able to  return to  playing golf OT Goal Formulation: With patient Time For Goal Achievement: 03/05/23 Potential to Achieve Goals: Good  OT Frequency: Min 2X/week    Co-evaluation PT/OT/SLP Co-Evaluation/Treatment: Yes (partial sesison) Reason for Co-Treatment: For patient/therapist safety          AM-PAC OT "6 Clicks" Daily Activity     Outcome Measure Help from another person eating meals?: A Little Help from another person taking care of personal grooming?: A Little Help from another person toileting, which includes using toliet, bedpan, or urinal?: A Little Help from another person bathing (including washing, rinsing, drying)?: A Little Help from another person to put on and taking off regular upper body clothing?: A Little Help from another person to put on and taking off regular lower body clothing?: A Little 6 Click Score: 18   End of Session Equipment Utilized During Treatment: Gait belt;Rolling walker (2 wheels) Nurse Communication: Mobility status  Activity Tolerance: Patient tolerated treatment well Patient left: in chair;with call bell/phone within reach;with chair alarm set  OT Visit Diagnosis: Unsteadiness on feet (R26.81);Other abnormalities of gait and mobility (R26.89);Muscle weakness (generalized) (M62.81);Other symptoms and signs involving cognitive function;Hemiplegia and hemiparesis Hemiplegia - Right/Left: Left Hemiplegia - dominant/non-dominant: Non-Dominant Hemiplegia - caused by: Cerebral infarction                Time: 4098-1191 OT Time Calculation (min): 35 min Charges:  OT General Charges $OT Visit: 1 Visit OT Evaluation $OT Eval Moderate Complexity: 1 Mod  Luxe Cuadros, OT/L   Acute OT Clinical Specialist Acute Rehabilitation Services Pager [phone] Office [phone]   [facility] 02/19/2023, 10:25 AM

## 2023-02-19 NOTE — Progress Notes (Addendum)
STROKE TEAM PROGRESS NOTE   BRIEF HPI [name] is a 72 y.o. male with history of PAF on eliquis, seizure history- discharged 08/03/2022 on 500mg  keppra BID, HTN, ESRD on HD (fills medications through [place] in [place]) presenting with left side weakness, left facial droop, confusion presenting with left side weakness and seizure activity.   SIGNIFICANT HOSPITAL EVENTS 7/2-code stroke, left-sided weakness, seizure activity during CT MRI 02/18/23  2 acute infarcts in right corona radiator measuring 13 mm.  Few scattered chronic microhemorrhages. CT angiogram 02/18/23 no emergent large vessel occlusion.  Severe stenosis of small nondominant right dominant vertebral artery and moderate proximal left V2 vertebral artery stenosis.  INTERIM HISTORY/SUBJECTIVE He has mild left-sided weakness but neurologically and hemodynamically stable. Endorses missing a couple of doses of eliquis.  Discussed the importance of staying compliant with medications.  Continue Keppra as he did have seizure activity in CT scanner yesterday   OBJECTIVE  CBC    Component Value Date/Time   WBC 5.2 02/19/2023 0434   RBC 3.54 (L) 02/19/2023 0434   HGB 11.4 (L) 02/19/2023 0434   HCT 34.2 (L) 02/19/2023 0434   PLT 210 02/19/2023 0434   MCV 96.6 02/19/2023 0434   MCH 32.2 02/19/2023 0434   MCHC 33.3 02/19/2023 0434   RDW 15.4 02/19/2023 0434   LYMPHSABS 1.2 02/18/2023 0900   MONOABS 0.6 02/18/2023 0900   EOSABS 0.1 02/18/2023 0900   BASOSABS 0.0 02/18/2023 0900    BMET    Component Value Date/Time   NA 135 02/19/2023 0434   K 4.6 02/19/2023 0434   CL 93 (L) 02/19/2023 0434   CO2 25 02/19/2023 0434   GLUCOSE 93 02/19/2023 0434   BUN 39 (H) 02/19/2023 0434   CREATININE 14.23 (H) 02/19/2023 0434   CALCIUM 9.1 02/19/2023 0434   GFRNONAA 3 (L) 02/19/2023 0434    IMAGING past 24 hours MR BRAIN WO CONTRAST  Result Date: 02/18/2023 CLINICAL DATA:  Provided history: Seizure, new onset, no history of  trauma. EXAM: MRI HEAD WITHOUT CONTRAST TECHNIQUE: Multiplanar, multiecho pulse sequences of the brain and surrounding structures were obtained without intravenous contrast. COMPARISON:  Noncontrast head CT and CT angiogram head/neck 02/18/2023. Brain MRI 07/31/2022 FINDINGS: Brain: Mild generalized parenchymal atrophy. Two acute infarcts within the right corona radiata measuring up to 13 mm. Chronic cortical/subcortical infarct (with associated chronic hemosiderin deposition) in the right parietal lobe. Chronic lacunar infarcts within the bilateral cerebral hemispheric white matter, deep gray nuclei and within the pons. Chronic hemosiderin deposition associated with a chronic lacunar infarct in the right basal ganglia. Background moderate multifocal T2 FLAIR hyperintense signal abnormality within the cerebral white matter, nonspecific but compatible with chronic small vessel ischemic disease. Multiple small chronic infarcts within the left cerebellar hemisphere. Chronic microhemorrhages scattered within the bilateral cerebral hemispheres, thalami, brainstem and cerebellum. No evidence of an intracranial mass No extra-axial fluid collection. No midline shift. Vascular: Maintained flow voids within the proximal large arterial vessels. Skull and upper cervical spine: No suspicious marrow lesion. Incompletely assessed cervical spondylosis. Sinuses/Orbits: No mass or acute finding within the imaged orbits. Mild mucosal thickening versus small mucous retention cyst within the inferior left maxillary sinus. Tiny mucous retention cysts within the bilateral sphenoid sinuses. IMPRESSION: 1. Two acute infarcts within the right corona radiata measuring up to 13 mm. 2. Background parenchymal atrophy, chronic small vessel ischemic disease and chronic infarcts, as detailed and stable from the prior brain MRI of 07/31/2022. 3. As before, there are scattered chronic microhemorrhages  within the supratentorial and infratentorial  brain. Findings likely at least partly reflect sequelae of chronic hypertensive microangiopathy. However, a superimposed component of cerebral amyloid angiopathy cannot be excluded. Electronically Signed   By: Jackey Loge D.O.   On: 02/18/2023 11:56   CT ANGIO HEAD NECK W WO CM (CODE STROKE)  Result Date: 02/18/2023 CLINICAL DATA:  Assess intracranial arteries. EXAM: CT ANGIOGRAPHY HEAD AND NECK WITH AND WITHOUT CONTRAST TECHNIQUE: Multidetector CT imaging of the head and neck was performed using the standard protocol during bolus administration of intravenous contrast. Multiplanar CT image reconstructions and MIPs were obtained to evaluate the vascular anatomy. Carotid stenosis measurements (when applicable) are obtained utilizing NASCET criteria, using the distal internal carotid diameter as the denominator. RADIATION DOSE REDUCTION: This exam was performed according to the departmental dose-optimization program which includes automated exposure control, adjustment of the mA and/or kV according to patient size and/or use of iterative reconstruction technique. CONTRAST:  75mL OMNIPAQUE IOHEXOL 350 MG/ML SOLN COMPARISON:  Same day CT head.  CTA head/neck 07/30/2022. FINDINGS: CTA NECK FINDINGS Aortic arch: Great vessel origins are patent without significant stenosis. Aortic atherosclerosis. Right carotid system: Atherosclerosis at the carotid bifurcation and involving the proximal ICA without greater than 50% stenosis. Left carotid system: Atherosclerosis at the carotid bifurcation without greater than 50% stenosis. Vertebral arteries: Potentially significant stenosis of the right vertebral artery origins; however, evaluation is significantly limited due to streak artifact. Moderate stenosis of the proximal left V2 vertebral artery. Skeleton: No evidence of acute abnormality on limited assessment. Other neck: No acute abnormality on limited assessment. Upper chest: Visualized lung apices are clear. Review of the  MIP images confirms the above findings CTA HEAD FINDINGS Anterior circulation: Bilateral intracranial ICAs, MCAs and ACAs are patent Posterior circulation: Severe stenosis of the intradural right vertebral artery. This vertebral artery is non dominant/small and terminates as PICA, anatomic variant. The left intradural vertebral artery, basilar artery and bilateral posterior cerebral arteries are patent. Left fetal type PCA. Venous sinuses: As permitted by contrast timing, patent. Anatomic variants: Detailed above. Review of the MIP images confirms the above findings IMPRESSION: 1. No emergent large vessel occlusion. 2. 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 3. Moderate proximal left V2 vertebral artery stenosis. 4. Potentially significant stenosis of the right vertebral artery origins; however, evaluation is significantly limited due to streak artifact. 5. Aortic Atherosclerosis (ICD10-I70.0) and Emphysema (ICD10-J43.9). Electronically Signed   By: Feliberto Harts M.D.   On: 02/18/2023 09:40   CT HEAD CODE STROKE WO CONTRAST  Result Date: 02/18/2023 CLINICAL DATA:  Code stroke.  Neuro deficit, acute, stroke suspected EXAM: CT HEAD WITHOUT CONTRAST TECHNIQUE: Contiguous axial images were obtained from the base of the skull through the vertex without intravenous contrast. RADIATION DOSE REDUCTION: This exam was performed according to the departmental dose-optimization program which includes automated exposure control, adjustment of the mA and/or kV according to patient size and/or use of iterative reconstruction technique. COMPARISON:  CT head 07/30/2022. FINDINGS: Brain: Similar appearance of remote infarcts in the right parietal lobe, basal ganglia and right frontal white matter. No evidence of acute large vascular territory infarct, acute hemorrhage, mass lesion, hydrocephalus, or midline shift. Vascular: No hyperdense vessel identified. Skull: No acute fracture.  Sinuses/Orbits: Mostly clear sinuses. Left maxillary sinus retention cyst. Other: No mastoid effusions. ASPECTS [facility] Stroke Program Early CT Score) total score (0-10 with 10 being normal): 10. IMPRESSION: 1. No evidence of acute intracranial abnormality. ASPECTS is 10. 2.  Multiple remote infarcts. An MRI could provide more sensitive evaluation for acute on chronic peri-infarct ischemia if clinically warranted. Code stroke imaging results were communicated on 02/18/2023 at 9:19 am to provider Dr. Selina Cooley Via secure text paging. Electronically Signed   By: Feliberto Harts M.D.   On: 02/18/2023 09:20    Vitals:   02/18/23 1800 02/18/23 2002 02/18/23 2355 02/19/23 0350  BP:  (!) 155/81 (!) 151/90 (!) 156/86  Pulse:  62 (!) 58 65  Resp:  18 18 18   Temp:  97.6 F (36.4 C) 97.9 F (36.6 C) 98.6 F (37 C)  TempSrc:  Oral Oral Oral  SpO2:  95% 99% 98%  Weight: 74.8 kg     Height: 6' (1.829 m)        PHYSICAL EXAM General:  Alert, well-nourished, well-developed patient in no acute distress Psych:  Mood and affect appropriate for situation CV: Regular rate and rhythm on monitor Respiratory:  Regular, unlabored respirations on room air GI: Abdomen soft and nontender   NEURO:  Mental Status: AA&Ox3, patient is able to give clear and coherent history Speech/Language: speech is without dysarthria or aphasia.  Naming, repetition, fluency, and comprehension intact.  Cranial Nerves:  II: PERRL. Visual fields full.  III, IV, VI: EOMI. Eyelids elevate symmetrically.  V: Sensation is intact to light touch and symmetrical to face.  VII: Face is symmetrical resting and smiling VIII: hearing intact to voice. IX, X: Palate elevates symmetrically. Phonation is normal.  ZO:[id number] shrug 5/5. XII: tongue is midline without fasciculations. Motor: 5/5 strength to all muscle groups tested.  Mild left grip weakness.  Diminished fine finger movements on the left.  Orbits right over left upper  extremity. Tone: is normal and bulk is normal Sensation- Intact to light touch bilaterally.  Coordination: Alternating movements slowed on the left side, orbiting.  Gait- deferred   ASSESSMENT/PLAN  Acute Ischemic Infarct:  Two right corona radiata infarcts  Etiology:  non adherence to eliquis   CT-head- No evidence of acute intracranial abnormality. ASPECTS is 10. Multiple remote infarcts.  CT Angio Head and Neck - No emergent large vessel occlusion. 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 Moderate proximal left V2 vertebral artery stenosis. Potentially significant stenosis of the right vertebral artery origins; however, evaluation is significantly limited due to streak artifact. Aortic Atherosclerosis (ICD10-I70.0) and Emphysema (ICD10-J43.9). MRI examination of the brain - Two acute infarcts within the right corona radiata measuring up to 13 mm. Background parenchymal atrophy, chronic small vessel ischemic disease and chronic infarcts, as detailed and stable from the prior brain MRI of 07/31/2022. As before, there are scattered chronic microhemorrhages within the supratentorial and infratentorial brain. Findings likely at least partly reflect sequelae of chronic hypertensive microangiopathy. However, a superimposed component of cerebral amyloid angiopathy cannot be excluded. 2D Echo EF 65-70%, left atrium is moderately dilated Routine EEG - this study is suggestive of cortical dysfunction arising from right hemisphere likely secondary to underlying stroke. No seizures or epileptiform discharges were seen throughout the recording.  LDL 68 HgbA1c 5.3 VTE prophylaxis -Eliquis Eliquis (apixaban) daily prior to admission, now on Eliquis (apixaban) daily  Therapy recommendations:  CIR Disposition: Pending  Atrial fibrillation Home Meds: Eliquis, amiodarone, diltiazem Continue telemetry monitoring Continue Eliquis  Hypertension Home meds: Clonidine 0.1 mg  twice daily Stable Blood Pressure Goal: BP less than 220/110 gradually decrease to normotensive  Hyperlipidemia Home meds: Atorvastatin 40 mg, resumed in hospital LDL 68, goal < 70  Other  Active Problems Seizure history He was seen in 07/2022 for left hemiparesis and seizure and during that admission LTM showed presences of right focal abnormality and he was on Keppra 500mg  BID. Per sister he was taken off of keppra after 6 months due to no recurrent seizures.  Keppra is still listed in his home medications (meds are currently filled through the [place]) Keppra 500 mg twice daily resumed ESRD on HD Nephrology managing  Hospital day # 1  Patient seen and examined by NP/APP with MD. MD to update note as needed.   Elmer Picker, DNP, FNP-BC [facility] Pager: [phone]  STROKE MD NOTE :  I have personally obtained history,examined this patient, reviewed notes, independently viewed imaging studies, participated in medical decision making and plan of care.ROS completed by me personally and pertinent positives fully documented  I have made any additions or clarifications directly to the above note. Agree with note above.  He presented with confusion, left facial droop and left-sided weakness secondary to right subcortical infarcts likely from his atrial fibrillation and noncompliance with Eliquis.  Patient counseled to be compliant with his medications and need for aggressive risk factor modification.  Resume Eliquis.  Patient also had seizures and has been started on Keppra recommend he continue that and do not drive for 6 months as per [facility].  Maintain aggressive risk factor modification.  Greater than 50% time during this 50-minute visit was spent on counseling and coordination of care about his embolic stroke, atrial fibrillation and need for being compliant with Eliquis as well as need for taking seizure medications and answering questions.  Delia Heady, MD Medical  Director [facility] Pager: [phone] 02/19/2023 4:53 PM  To contact Stroke Continuity provider, please refer to [email or web address]. After hours, contact General Neurology

## 2023-02-19 NOTE — Hospital Course (Addendum)
Acute cerebral infarctions within right corona radiata Two acute infarcts noted within R corona radiata, measuring up to 13 mm on MRI. Unclear if embolic or thrombotic in nature, although patient did report to neurology that he was not taking his eliquis daily, suspicious for embolic etiology. CT angio head/neck without large vessel occlusion, although there is atherosclerosis at the carotid bifurcation and involving the proximal ICA (without greater than 50% stenosis). LDL at goal, 68. A1c 5.3%. Echo demonstrated no concerns for stroke source. The patient worked with PT/OT throughout his hospitalization and was recommended for inpatient rehab - patient will discharge to CIR. He will continue eliquis 5 mg BID and atorvastatin 40 mg daily. Advised to follow up with PCP through the [facility] and Neurology once he is discharged to home.   Paroxysmal atrial fibrillation Currently being treated with amiodarone 200 mg daily, diltiazem 180 mg daily, and eliquis. Amio and dilt were held on admission, in the setting of permissive HTN. Resume both medications at discharge.   Seizure Disorder First documented back in 2023 and the patient is prescribed Keppra 500 mg BID. He had some seizure like activity on arrival and was given ativan. EEG was negative for seizures/epileptiform activity. Discharged with Keppra 500 mg BID - advised to take after dialysis on [facility].   ESRD on HD [facility] Anemia of chronic disease Received dialysis while admitted and will continue on TThS schedule.   Hypertension  Chronic with current home medications of Coreg, diltiazem, and clonidine. Held on admission in the setting of permissive hypertension, but resumed at discharge.

## 2023-02-19 NOTE — Progress Notes (Signed)
Pt receives out-pt HD at [facility] on TTS. Will assist as needed.   Veleta Yamamoto Renal Navigator [phone] 

## 2023-02-19 NOTE — Procedures (Signed)
Patient Name: Tanner Rollins  MRN: [id number]  Epilepsy Attending: Charlsie Quest  Referring Physician/Provider: Gwenevere Abbot, MD  Date: 02/18/2023 Duration: 26.03 mins  Patient history: 72 y.o. male with a past medical history of PAF on eliquis, seizure history- discharged 08/03/2022 on 500mg  keppra BID, HTN, ESRD on HD (fills medications through [place] in [place]) presenting with left side weakness, left facial droop, confusion.   Level of alertness: Awake, asleep  AEDs during EEG study: LEV  Technical aspects: This EEG study was done with scalp electrodes positioned according to the 10-20 International system of electrode placement. Electrical activity was reviewed with band pass filter of 1-70Hz , sensitivity of 7 uV/mm, display speed of 31mm/sec with a 60Hz  notched filter applied as appropriate. EEG data were recorded continuously and digitally stored.  Video monitoring was available and reviewed as appropriate.  Description: The posterior dominant rhythm consists of 8 Hz activity of moderate voltage (25-35 uV) seen predominantly in posterior head regions, symmetric and reactive to eye opening and eye closing. Sleep was characterized by vertex waves, sleep spindles (12 to 14 Hz), maximal frontocentral region. EEG showed continuous 3 to 6 Hz theta-delta slowing in right hemisphere. Physiologic photic driving was not seen during photic stimulation.  Hyperventilation was not performed.     ABNORMALITY - Continuous slow, right hemisphere  IMPRESSION: This study is suggestive of cortical dysfunction arising from right hemisphere likely secondary to underlying stroke. No seizures or epileptiform discharges were seen throughout the recording.  Ke[place]on Garden Annabelle Harman

## 2023-02-19 NOTE — Evaluation (Addendum)
Physical Therapy Evaluation Patient Details Name: Tanner Rollins MRN: [id number] DOB: 05/16/1951 Today's Date: 02/19/2023  History of Present Illness  Patient is 72 y.o. male who presented to the ED via EMS after weakness and a syncopal episode and had a seizure en-route to the ED and repeat seizure activity in the ED. MRI work-up revealed two acute infarcts within the right corona radiata as well as background parenchymal atrophy, chronic small vessel ischemic disease and chronic infarcts. PMH significant for ESRD TTS, HTN, Afib with RVR, prior respiratory failure, metabolic bone disease.   Clinical Impression  Tanner Rollins is 71 y.o. male admitted with above HPI and diagnosis. Patient is currently limited by functional impairments below (see PT problem list). Patient lives alone and is independent at baseline; he has excellent support available from his children and his sister as needed. Currently he requires min assist for transfers and gait with RW and min-mod cues for sequencing/problem solving higher level dual task activities. As pt fatigues cognition and declines and pt's Lt LE noted to have decreased muscular endurance. Patient will benefit from continued skilled PT interventions to address impairments and progress independence with mobility, recommending  intensive inpatient follow up therapy, >3 hours/day. Acute PT will follow and progress as able.         Assistance Recommended at Discharge Intermittent Supervision/Assistance  If plan is discharge home, recommend the following:  Can travel by private vehicle  A little help with walking and/or transfers;A little help with bathing/dressing/bathroom;Assistance with cooking/housework;Direct supervision/assist for medications management;Assist for transportation;Help with stairs or ramp for entrance        Equipment Recommendations None recommended by PT  Recommendations for Other Services       Functional Status Assessment  Patient has had a recent decline in their functional status and demonstrates the ability to make significant improvements in function in a reasonable and predictable amount of time.     Precautions / Restrictions Precautions Precautions: Fall Restrictions Weight Bearing Restrictions: No      Mobility  Bed Mobility Overal bed mobility: Needs Assistance Bed Mobility: Supine to Sit     Supine to sit: Supervision     General bed mobility comments: sup for safety, HOB elevated    Transfers Overall transfer level: Needs assistance Equipment used: Rolling walker (2 wheels) Transfers: Sit to/from Stand Sit to Stand: Min assist                Ambulation/Gait Ambulation/Gait assistance: Min Chemical engineer (Feet): 110 Feet Assistive device: Rolling walker (2 wheels) Gait Pattern/deviations: Step-through pattern, Decreased step length - left, Decreased stance time - left, Knee flexed in stance - left, Narrow base of support Gait velocity: decr        Stairs            Wheelchair Mobility     Tilt Bed    Modified Rankin (Stroke Patients Only)       Balance Overall balance assessment: Needs assistance Sitting-balance support: Feet supported Sitting balance-Leahy Scale: Fair     Standing balance support: During functional activity, Reliant on assistive device for balance, Bilateral upper extremity supported Standing balance-Leahy Scale: Poor                               Pertinent Vitals/Pain Pain Assessment Pain Assessment: Faces Faces Pain Scale: Hurts a little bit Pain Location: Lt hip Pain Descriptors / Indicators: Discomfort Pain Intervention(s): Limited activity within  patient's tolerance, Monitored during session, Repositioned    Home Living Family/patient expects to be discharged to:: Private residence Living Arrangements: Alone Available Help at Discharge: Family;Available 24 hours/day (sister checks on him 3x/day) Type  of Home: Apartment Home Access: Elevator       Home Layout: One level Home Equipment: Agricultural consultant (2 wheels);Cane - single point;Wheelchair - manual;Grab bars - tub/shower;Hand held shower head (has an emergency pull cord in bathroom and bedroom)      Prior Function Prior Level of Function : Independent/Modified Independent;Driving;History of Falls (last six months)             Mobility Comments: reports using RW or wc "when he needs"; has fal;len a couple of times in teh past 6 months but could get up on his own       Hand Dominance   Dominant Hand: Right    Extremity/Trunk Assessment   Upper Extremity Assessment Upper Extremity Assessment: LUE deficits/detail LUE Deficits / Details: AROM WFL; strength generally 4/5 throughout; aparent coordination deficits; unaware at times that L hand was not on RW LUE Sensation: decreased proprioception LUE Coordination: decreased fine motor    Lower Extremity Assessment Lower Extremity Assessment: Defer to PT evaluation RLE Deficits / Details: 4+/5 thorughout hip flex, knee ext/flex, DF; AROM WFL. RLE Sensation: WNL RLE Coordination: WNL LLE Deficits / Details: 4+/5 throughout hip flex, knee ext/flex, DF; AROM WFL. some coordination deficits noted with LT LE placement during gait. LLE Sensation: decreased proprioception LLE Coordination: decreased gross motor    Cervical / Trunk Assessment Cervical / Trunk Assessment: Normal  Communication   Communication: No difficulties  Cognition Arousal/Alertness: Awake/alert Behavior During Therapy: WFL for tasks assessed/performed Overall Cognitive Status: Impaired/Different from baseline Area of Impairment: Attention, Safety/judgement, Awareness, Problem solving, Memory                   Current Attention Level: Selective Memory: Decreased short-term memory   Safety/Judgement: Decreased awareness of deficits, Decreased awareness of safety Awareness: Emergent Problem  Solving: Slow processing, Difficulty sequencing General Comments: more difficulty with higher level cogniive tasks in dynamic activites and as pt fatigued increasing his risk of falls. dynamic gait acitvities more challenging and pt required increased assist to negotiation busy environments.        General Comments      Exercises     Assessment/Plan    PT Assessment Patient needs continued PT services  PT Problem List Decreased strength;Decreased activity tolerance;Decreased mobility;Decreased balance;Decreased coordination;Decreased cognition;Decreased safety awareness       PT Treatment Interventions DME instruction;Patient/family education;Cognitive remediation;Neuromuscular re-education;Balance training;Therapeutic exercise;Therapeutic activities;Functional mobility training;Stair training;Gait training    PT Goals (Current goals can be found in the Care Plan section)  Acute Rehab PT Goals Patient Stated Goal: regain independence PT Goal Formulation: With patient Time For Goal Achievement: 03/05/23 Potential to Achieve Goals: Good    Frequency Min 5X/week     Co-evaluation   Reason for Co-Treatment: For patient/therapist safety           AM-PAC PT "6 Clicks" Mobility  Outcome Measure Help needed turning from your back to your side while in a flat bed without using bedrails?: A Little Help needed moving from lying on your back to sitting on the side of a flat bed without using bedrails?: A Little Help needed moving to and from a bed to a chair (including a wheelchair)?: A Little Help needed standing up from a chair using your arms (e.g., wheelchair or bedside  chair)?: A Little Help needed to walk in hospital room?: A Little Help needed climbing 3-5 steps with a railing? : A Lot 6 Click Score: 17    End of Session Equipment Utilized During Treatment: Gait belt Activity Tolerance: Patient tolerated treatment well Patient left:  (standing at sink with OT  present) Nurse Communication: Mobility status PT Visit Diagnosis: Unsteadiness on feet (R26.81);Other abnormalities of gait and mobility (R26.89);Muscle weakness (generalized) (M62.81);Difficulty in walking, not elsewhere classified (R26.2)    Time: 0930-1006 PT Time Calculation (min) (ACUTE ONLY): 36 min   Charges:   PT Evaluation $PT Eval Moderate Complexity: 1 Mod   PT General Charges $$ ACUTE PT VISIT: 1 Visit         Wynn Maudlin, DPT Acute Rehabilitation Services Office [phone]  02/19/23 11:02 AM

## 2023-02-19 NOTE — Plan of Care (Signed)
  Problem: Education: Goal: Knowledge of disease or condition will improve Outcome: Progressing Goal: Knowledge of secondary prevention will improve (MUST DOCUMENT ALL) Outcome: Progressing Goal: Knowledge of patient specific risk factors will improve Loraine Leriche N/A or DELETE if not current risk factor) Outcome: Progressing   Problem: Coping: Goal: Will identify appropriate support needs Outcome: Progressing   Problem: Self-Care: Goal: Ability to participate in self-care as condition permits will improve Outcome: Progressing   Problem: Nutrition: Goal: Risk of aspiration will decrease Outcome: Progressing Goal: Dietary intake will improve Outcome: Progressing

## 2023-02-19 NOTE — Evaluation (Signed)
Clinical/Bedside Swallow Evaluation Patient Details  Name: Tanner Rollins MRN: [id number] Date of Birth: [birth year]  Today's Date: 02/19/2023 Time: SLP Start Time (ACUTE ONLY): 0825 SLP Stop Time (ACUTE ONLY): 0842 SLP Time Calculation (min) (ACUTE ONLY): 17 min  Past Medical History:  Past Medical History:  Diagnosis Date   ESRD on hemodialysis (HCC)    [facility]   Hypertension    PAF (paroxysmal atrial fibrillation) (HCC)    Past Surgical History:  Past Surgical History:  Procedure Laterality Date   ANGIOPLASTY Left 06/10/2022   Procedure: Balloon VENOPLASTY;  Surgeon: Chuck Hint, MD;  Location: [facility] OR;  Service: Vascular;  Laterality: Left;   AV FISTULA PLACEMENT Left 01/28/2022   Procedure: ARTERIOVENOUS (AV) FISTULA CREATION VS.GRAFT ARM;  Surgeon: Chuck Hint, MD;  Location: [facility] OR;  Service: Vascular;  Laterality: Left;   FISTULOGRAM Left 06/10/2022   Procedure: FISTULOGRAM;  Surgeon: Chuck Hint, MD;  Location: [facility] OR;  Service: Vascular;  Laterality: Left;   IR FLUORO GUIDE CV LINE RIGHT  01/24/2022   IR REMOVAL TUN CV CATH W/O FL  10/04/2022   IR THORACENTESIS ASP PLEURAL SPACE W/IMG GUIDE  02/12/2022   IR US GUIDE VASC ACCESS RIGHT  01/24/2022   HPI:  Pt is a 72 year old man with PMHx of Seizure disorder c/b status epilepticus, HTN, ESRD on HD, PAF on AC who presented to the ED via EMS after weakness and a syncopal episode and had a seizure en-route to the ED and repeat seizure activity in the ED. Stroke was suspected 07/2022. MRI two acute infarcts within the right corona radia, scattered chronic microhemorrhages within supratentorial and infratentorial brain. chronic small vessel ischemic disease and chronic infarcts from MRI 07/2022    Assessment / Plan / Recommendation  Clinical Impression  Pt alert for swallow assessment with intact oromotor abilities and most dentition intact, missing several posterior. He denies dysphagia. Observed  with thin water, applesauce and graham cracker, He did have one episode of possible airway intrusion. After solid he took a sip via straw and began to cough intermittently for next several minutes which eventually cleared. Observed with portion of breakfast in which he did not have further s/s aspiration. Pt states he has a hiatal hernia and educated to stay sitting up after meals. Recommend he continue regular texture, thin liquids, pills whole in puree, small sips. ST will follow up to ensure safety with recommendations. SLP Visit Diagnosis: Dysphagia, unspecified (R13.10)    Aspiration Risk  Mild aspiration risk    Diet Recommendation Regular;Thin liquid    Liquid Administration via: Straw;Cup Medication Administration: Whole meds with puree Supervision: Patient able to self feed;Intermittent supervision to cue for compensatory strategies Compensations: Small sips/bites;Slow rate Postural Changes: Seated upright at 90 degrees;Remain upright for at least 30 minutes after po intake (has hiatal hernia)    Other  Recommendations Oral Care Recommendations: Oral care BID    Recommendations for follow up therapy are one component of a multi-disciplinary discharge planning process, led by the attending physician.  Recommendations may be updated based on patient status, additional functional criteria and insurance authorization.  Follow up Recommendations        Assistance Recommended at Discharge    Functional Status Assessment Patient has had a recent decline in their functional status and demonstrates the ability to make significant improvements in function in a reasonable and predictable amount of time.  Frequency and Duration min 2x/week  2 weeks  Prognosis Prognosis for improved oropharyngeal function: Good      Swallow Study   General Date of Onset: 02/18/23 HPI: Pt is a 72 year old man with PMHx of Seizure disorder c/b status epilepticus, HTN, ESRD on HD, PAF on AC who  presented to the ED via EMS after weakness and a syncopal episode and had a seizure en-route to the ED and repeat seizure activity in the ED. Stroke was suspected 07/2022. MRI two acute infarcts within the right corona radia, scattered chronic microhemorrhages within supratentorial and infratentorial brain. chronic small vessel ischemic disease and chronic infarcts from MRI 07/2022 Type of Study: Bedside Swallow Evaluation Previous Swallow Assessment:  (none) Diet Prior to this Study: Regular;Thin liquids (Level 0) Temperature Spikes Noted: No Respiratory Status: Room air History of Recent Intubation: No Behavior/Cognition: Alert;Cooperative;Pleasant mood Oral Cavity Assessment: Within Functional Limits Oral Care Completed by SLP: No Oral Cavity - Dentition: Missing dentition (missing some posterior) Vision: Functional for self-feeding Self-Feeding Abilities: Able to feed self Patient Positioning: Upright in bed Baseline Vocal Quality: Normal Volitional Cough: Strong Volitional Swallow: Able to elicit    Oral/Motor/Sensory Function Overall Oral Motor/Sensory Function: Within functional limits   Ice Chips Ice chips: Not tested   Thin Liquid Thin Liquid: Impaired Presentation: Straw Pharyngeal  Phase Impairments: Cough - Immediate    Nectar Thick Nectar Thick Liquid: Not tested   Honey Thick Honey Thick Liquid: Not tested   Puree Puree: Within functional limits   Solid     Solid: Within functional limits      Royce Macadamia 02/19/2023,8:53 AM

## 2023-02-19 NOTE — Consult Note (Addendum)
[facility] Renal Consultation Note    Indication for Consultation:  Management of ESRD/hemodialysis, anemia, hypertension/volume, and secondary hyperparathyroidism. PCP:  HPI: Tanner Rollins is a 72 y.o. male with ESRD, HTN, pA-Fib (on Eliquis - sister reported missing doses), and Hx seizure 07/2022 (on Keppra until 12/2022) who was admitted with acute CVA.  Had been driving to dialysis in his usual state of health on 7/2, when he began to feel weak. Pulled over to a gas statin where he had a syncopal episode and new onset L arm weakness -> EMS called and brought to ED as CODE STROKE. In the ED, vitals were normal. Labs with Na 136, K 3.9, CO2 22, BUN 38, Cr 13.9, Alb 3.5, WBC 5.6, Hgb 11.9. Head CT showed remote strokes, CTA showed severe stenosis of R intradural vertebral artery. Brain MRI showed 2 acute infarcts in R corona radiata. He was noted to have L arm twitching, L facial droop, and L gaze preference during imaging.Resumed on keppra and given Ativan per neuro, looks like tPA was contraindicated due to Eliquis. Secondary work-up is ongoing.  Today, seen in his room. Sitting on side of bed, getting ready to walk with PT. Appears to be completely back to baseline. Speaking appropriately, using all extremities. Denies recent fever, chills, CP, dyspnea, N/V/D. Reports having some abd pain related to hernia which is being worked up at the [place]. Also, I know from outpatient that he is being evaluated soon by urology for high PSA (~40).  Dialyzes on TTS schedule at [facility]. Missed HD yesterday due to the above. No recent HD or AVF issues. His BP does occ drop with HD - will aim to avoid this today.  Past Medical History:  Diagnosis Date   ESRD on hemodialysis (HCC)    [facility]   Hypertension    PAF (paroxysmal atrial fibrillation) [facility])    Past Surgical History:  Procedure Laterality Date   ANGIOPLASTY Left 06/10/2022   Procedure: Balloon VENOPLASTY;  Surgeon: Chuck Hint, MD;  Location: [facility] OR;  Service: Vascular;  Laterality: Left;   AV FISTULA PLACEMENT Left 01/28/2022   Procedure: ARTERIOVENOUS (AV) FISTULA CREATION VS.GRAFT ARM;  Surgeon: Chuck Hint, MD;  Location: [facility] OR;  Service: Vascular;  Laterality: Left;   FISTULOGRAM Left 06/10/2022   Procedure: FISTULOGRAM;  Surgeon: Chuck Hint, MD;  Location: [facility] OR;  Service: Vascular;  Laterality: Left;   IR FLUORO GUIDE CV LINE RIGHT  01/24/2022   IR REMOVAL TUN CV CATH W/O FL  10/04/2022   IR THORACENTESIS ASP PLEURAL SPACE W/IMG GUIDE  02/12/2022   IR US GUIDE VASC ACCESS RIGHT  01/24/2022   History reviewed. No pertinent family history. Social History:  reports that he quit smoking about 33 years ago. His smoking use included cigarettes. He has never used smokeless tobacco. He reports that he does not drink alcohol and does not use drugs.  ROS: As per HPI otherwise negative.  Physical Exam: Vitals:   02/18/23 2002 02/18/23 2355 02/19/23 0350 02/19/23 0846  BP: (!) 155/81 (!) 151/90 (!) 156/86 (!) 174/91  Pulse: 62 (!) 58 65 64  Resp: 18 18 18 17   Temp: 97.6 F (36.4 C) 97.9 F (36.6 C) 98.6 F (37 C) 98.9 F (37.2 C)  TempSrc: Oral Oral Oral Oral  SpO2: 95% 99% 98% 100%  Weight:      Height:         General: Well developed, well nourished, in no acute distress. Room  air. Head: Normocephalic, atraumatic, sclera non-icteric, mucus membranes are moist. Neck: Supple without lymphadenopathy/masses. JVD not elevated. Lungs: Clear bilaterally to auscultation without wheezes, rales, or rhonchi. Breathing is unlabored. Heart: RRR with normal S1, S2. No murmurs, rubs, or gallops appreciated. Abdomen: Soft, non-tender, non-distended with normoactive bowel sounds.  Musculoskeletal:  Strength and tone appear normal for age. Lower extremities: No edema or ischemic changes, no open wounds. Neuro: Alert and oriented X 3. Moves all extremities spontaneously. Psych:  Responds to  questions appropriately with a normal affect. Dialysis Access: LUE AVF + bruit  No Known Allergies Prior to Admission medications   Medication Sig Start Date End Date Taking? Authorizing Provider  amiodarone (PACERONE) 200 MG tablet Take 1 tablet (200 mg total) by mouth daily. 02/23/22 08/01/23  Jerald Kief, MD  apixaban (ELIQUIS) 5 MG TABS tablet Take 1 tablet (5 mg total) by mouth 2 (two) times daily. 01/31/22   Burnadette Pop, MD  atorvastatin (LIPITOR) 40 MG tablet Take 20 mg by mouth daily.    [provider]  calcium acetate (PHOSLO) 667 MG capsule Take 2 capsules (1,334 mg total) by mouth 3 (three) times daily with meals. 01/31/22   Burnadette Pop, MD  cloNIDine (CATAPRES) 0.1 MG tablet Take 0.1 mg by mouth 2 (two) times daily.    [provider]  diltiazem (CARDIZEM CD) 180 MG 24 hr capsule Take 1 capsule (180 mg total) by mouth daily. 02/01/22   Burnadette Pop, MD  docusate sodium (COLACE) 100 MG capsule Take 1 capsule (100 mg total) by mouth 2 (two) times daily. 01/31/22   Burnadette Pop, MD  levETIRAcetam (KEPPRA) 500 MG tablet Take 1 tablet (500 mg total) by mouth 2 (two) times daily. 08/03/22   Marguerita Merles Latif, DO  Multiple Vitamin (MULTIVITAMIN WITH MINERALS) TABS tablet Take 1 tablet by mouth daily.    [provider]  Nutritional Supplements (FEEDING SUPPLEMENT, NEPRO CARB STEADY,) LIQD Take 237 mLs by mouth every evening.    [provider]  ondansetron (ZOFRAN) 4 MG tablet Take 1 tablet (4 mg total) by mouth every 6 (six) hours as needed for nausea. 08/03/22   Sheikh, Kateri Mc Latif, DO  polyethylene glycol (MIRALAX / GLYCOLAX) 17 g packet Take 17 g by mouth daily as needed for moderate constipation. 01/31/22   Burnadette Pop, MD   Current Facility-Administered Medications  Medication Dose Route Frequency Provider Last Rate Last Admin   acetaminophen (TYLENOL) tablet 650 mg  650 mg Oral Q6H PRN Gwenevere Abbot, MD   650 mg at 02/19/23 0549    Or   acetaminophen (TYLENOL) suppository 650 mg  650 mg Rectal Q6H PRN Gwenevere Abbot, MD       apixaban Everlene Balls) tablet 5 mg  5 mg Oral BID Gwenevere Abbot, MD   5 mg at 02/19/23 0930   atorvastatin (LIPITOR) tablet 40 mg  40 mg Oral Daily Gwenevere Abbot, MD   40 mg at 02/19/23 0930   Chlorhexidine Gluconate Cloth 2 % PADS 6 each  6 each Topical Daily Tyson Alias, MD       [START ON 02/20/2023] levETIRAcetam (KEPPRA) 250 mg in sodium chloride 0.9 % 100 mL IVPB  250 mg Intravenous Once per day on Tue Thu Sat Gwenevere Abbot, MD       levETIRAcetam (KEPPRA) IVPB 500 mg/100 mL premix  500 mg Intravenous Q12H Gwenevere Abbot, MD 400 mL/hr at 02/19/23 0838 500 mg at 02/19/23 0838   senna-docusate (Senokot-S) tablet 1 tablet  1  tablet Oral QHS PRN Gwenevere Abbot, MD       sodium chloride flush (NS) 0.9 % injection 3 mL  3 mL Intravenous Once Gerhard Munch, MD       Labs: Basic Metabolic Panel: Recent Labs  Lab 02/18/23 0900 02/18/23 0903 02/19/23 0434  NA 136 136 135  K 4.0 3.9 4.6  CL 94* 97* 93*  CO2 22  --  25  GLUCOSE 120* 116* 93  BUN 33* 38* 39*  CREATININE 12.75* 13.90* 14.23*  CALCIUM 9.2  --  9.1  PHOS  --   --  8.0*   Liver Function Tests: Recent Labs  Lab 02/18/23 0900 02/19/23 0434  AST 18  --   ALT 13  --   ALKPHOS 87  --   BILITOT 0.7  --   PROT 6.8  --   ALBUMIN 3.5 3.1*   CBC: Recent Labs  Lab 02/18/23 0900 02/18/23 0903 02/19/23 0434  WBC 5.6  --  5.2  NEUTROABS 3.7  --   --   HGB 11.1* 11.9* 11.4*  HCT 34.6* 35.0* 34.2*  MCV 98.0  --  96.6  PLT 196  --  210   Studies/Results: EEG adult  Result Date: 02/19/2023 Charlsie Quest, MD     02/19/2023 10:12 AM Patient Name: Tanner Rollins MRN: [id number] Epilepsy Attending: Charlsie Quest Referring Physician/Provider: Gwenevere Abbot, MD Date: 02/18/2023 Duration: 26.03 mins Patient history: 72 y.o. male with a past medical history of PAF on eliquis, seizure history- discharged 08/03/2022 on 500mg  keppra BID,  HTN, ESRD on HD (fills medications through [place] in [place]) presenting with left side weakness, left facial droop, confusion. Level of alertness: Awake, asleep AEDs during EEG study: LEV Technical aspects: This EEG study was done with scalp electrodes positioned according to the 10-20 International system of electrode placement. Electrical activity was reviewed with band pass filter of 1-70Hz , sensitivity of 7 uV/mm, display speed of 49mm/sec with a 60Hz  notched filter applied as appropriate. EEG data were recorded continuously and digitally stored.  Video monitoring was available and reviewed as appropriate. Description: The posterior dominant rhythm consists of 8 Hz activity of moderate voltage (25-35 uV) seen predominantly in posterior head regions, symmetric and reactive to eye opening and eye closing. Sleep was characterized by vertex waves, sleep spindles (12 to 14 Hz), maximal frontocentral region. EEG showed continuous 3 to 6 Hz theta-delta slowing in right hemisphere. Physiologic photic driving was not seen during photic stimulation.  Hyperventilation was not performed.   ABNORMALITY - Continuous slow, right hemisphere IMPRESSION: This study is suggestive of cortical dysfunction arising from right hemisphere likely secondary to underlying stroke. No seizures or epileptiform discharges were seen throughout the recording. Charlsie Quest   MR BRAIN WO CONTRAST  Result Date: 02/18/2023 CLINICAL DATA:  Provided history: Seizure, new onset, no history of trauma. EXAM: MRI HEAD WITHOUT CONTRAST TECHNIQUE: Multiplanar, multiecho pulse sequences of the brain and surrounding structures were obtained without intravenous contrast. COMPARISON:  Noncontrast head CT and CT angiogram head/neck 02/18/2023. Brain MRI 07/31/2022 FINDINGS: Brain: Mild generalized parenchymal atrophy. Two acute infarcts within the right corona radiata measuring up to 13 mm. Chronic cortical/subcortical infarct (with associated chronic  hemosiderin deposition) in the right parietal lobe. Chronic lacunar infarcts within the bilateral cerebral hemispheric white matter, deep gray nuclei and within the pons. Chronic hemosiderin deposition associated with a chronic lacunar infarct in the right basal ganglia. Background moderate multifocal T2 FLAIR hyperintense signal abnormality within the cerebral white  matter, nonspecific but compatible with chronic small vessel ischemic disease. Multiple small chronic infarcts within the left cerebellar hemisphere. Chronic microhemorrhages scattered within the bilateral cerebral hemispheres, thalami, brainstem and cerebellum. No evidence of an intracranial mass No extra-axial fluid collection. No midline shift. Vascular: Maintained flow voids within the proximal large arterial vessels. Skull and upper cervical spine: No suspicious marrow lesion. Incompletely assessed cervical spondylosis. Sinuses/Orbits: No mass or acute finding within the imaged orbits. Mild mucosal thickening versus small mucous retention cyst within the inferior left maxillary sinus. Tiny mucous retention cysts within the bilateral sphenoid sinuses. IMPRESSION: 1. Two acute infarcts within the right corona radiata measuring up to 13 mm. 2. Background parenchymal atrophy, chronic small vessel ischemic disease and chronic infarcts, as detailed and stable from the prior brain MRI of 07/31/2022. 3. As before, there are scattered chronic microhemorrhages within the supratentorial and infratentorial brain. Findings likely at least partly reflect sequelae of chronic hypertensive microangiopathy. However, a superimposed component of cerebral amyloid angiopathy cannot be excluded. Electronically Signed   By: Jackey Loge D.O.   On: 02/18/2023 11:56   CT ANGIO HEAD NECK W WO CM (CODE STROKE)  Result Date: 02/18/2023 CLINICAL DATA:  Assess intracranial arteries. EXAM: CT ANGIOGRAPHY HEAD AND NECK WITH AND WITHOUT CONTRAST TECHNIQUE: Multidetector CT  imaging of the head and neck was performed using the standard protocol during bolus administration of intravenous contrast. Multiplanar CT image reconstructions and MIPs were obtained to evaluate the vascular anatomy. Carotid stenosis measurements (when applicable) are obtained utilizing NASCET criteria, using the distal internal carotid diameter as the denominator. RADIATION DOSE REDUCTION: This exam was performed according to the departmental dose-optimization program which includes automated exposure control, adjustment of the mA and/or kV according to patient size and/or use of iterative reconstruction technique. CONTRAST:  75mL OMNIPAQUE IOHEXOL 350 MG/ML SOLN COMPARISON:  Same day CT head.  CTA head/neck 07/30/2022. FINDINGS: CTA NECK FINDINGS Aortic arch: Great vessel origins are patent without significant stenosis. Aortic atherosclerosis. Right carotid system: Atherosclerosis at the carotid bifurcation and involving the proximal ICA without greater than 50% stenosis. Left carotid system: Atherosclerosis at the carotid bifurcation without greater than 50% stenosis. Vertebral arteries: Potentially significant stenosis of the right vertebral artery origins; however, evaluation is significantly limited due to streak artifact. Moderate stenosis of the proximal left V2 vertebral artery. Skeleton: No evidence of acute abnormality on limited assessment. Other neck: No acute abnormality on limited assessment. Upper chest: Visualized lung apices are clear. Review of the MIP images confirms the above findings CTA HEAD FINDINGS Anterior circulation: Bilateral intracranial ICAs, MCAs and ACAs are patent Posterior circulation: Severe stenosis of the intradural right vertebral artery. This vertebral artery is non dominant/small and terminates as PICA, anatomic variant. The left intradural vertebral artery, basilar artery and bilateral posterior cerebral arteries are patent. Left fetal type PCA. Venous sinuses: As permitted  by contrast timing, patent. Anatomic variants: Detailed above. Review of the MIP images confirms the above findings IMPRESSION: 1. No emergent large vessel occlusion. 2. 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 3. Moderate proximal left V2 vertebral artery stenosis. 4. Potentially significant stenosis of the right vertebral artery origins; however, evaluation is significantly limited due to streak artifact. 5. Aortic Atherosclerosis (ICD10-I70.0) and Emphysema (ICD10-J43.9). Electronically Signed   By: Feliberto Harts M.D.   On: 02/18/2023 09:40   CT HEAD CODE STROKE WO CONTRAST  Result Date: 02/18/2023 CLINICAL DATA:  Code stroke.  Neuro deficit, acute, stroke suspected EXAM:  CT HEAD WITHOUT CONTRAST TECHNIQUE: Contiguous axial images were obtained from the base of the skull through the vertex without intravenous contrast. RADIATION DOSE REDUCTION: This exam was performed according to the departmental dose-optimization program which includes automated exposure control, adjustment of the mA and/or kV according to patient size and/or use of iterative reconstruction technique. COMPARISON:  CT head 07/30/2022. FINDINGS: Brain: Similar appearance of remote infarcts in the right parietal lobe, basal ganglia and right frontal white matter. No evidence of acute large vascular territory infarct, acute hemorrhage, mass lesion, hydrocephalus, or midline shift. Vascular: No hyperdense vessel identified. Skull: No acute fracture. Sinuses/Orbits: Mostly clear sinuses. Left maxillary sinus retention cyst. Other: No mastoid effusions. ASPECTS [facility] Stroke Program Early CT Score) total score (0-10 with 10 being normal): 10. IMPRESSION: 1. No evidence of acute intracranial abnormality. ASPECTS is 10. 2. Multiple remote infarcts. An MRI could provide more sensitive evaluation for acute on chronic peri-infarct ischemia if clinically warranted. Code stroke imaging results were communicated on  02/18/2023 at 9:19 am to provider Dr. Selina Cooley Via secure text paging. Electronically Signed   By: Feliberto Harts M.D.   On: 02/18/2023 09:20    Dialysis Orders:  TTS at [facility] 4hr, 400/800, EDW 70kg, 2K/2Ca, AVF, 15g needles, heparin 3000 bolus - no recent ESA, Hgb typically > 11 - no VDRA at the moment, prev on Hectoral - held as of 6/29 for Ca 10.2  Assessment/Plan:  Acute R CVA (2 areas): Neuro following, started on Keppra. Appears to be back to baseline today.  ESRD:  Usual TTS schedule - missed HD yesterday, will dialyze today - aiming to avoid hypotension.  Hypertension/volume: BP high - permissive HTN. No edema.  Anemia: Hgb > 11, no ESA for now  Metabolic bone disease: Ca ok, Phos high - resume sevelamer as binder.  Nutrition:  Alb low, adding supps.  pA-Fib: On Eliquis  Lily Peer 02/19/2023, 10:16 AM  [facility]  Nephrology attending: I have personally seen and examined the patient.  Chart reviewed.  I agree with assessment plan as above.  ESRD on HD admitted with acute stroke.  Missed dialysis yesterday therefore we will plan to do HD today.  Eddie North, [facility].

## 2023-02-19 NOTE — Progress Notes (Addendum)
HD#1 SUBJECTIVE:  Patient Summary: Tanner Rollins is a 72 y.o. with a pertinent PMH of ESRD on HD TThS, afib, and seizure disorder, who presented with limb weakness and admitted for acute infarctions in right corona radiata and seizure activity.   Overnight Events: No acute events overnight  Interim History: The patient was evaluated at the bedside. He states that he has some pain in his left hip, but this is well managed. He has been able to eat and drink without difficulty and feels that he is close to his baseline.   OBJECTIVE:  Vital Signs: Vitals:   02/18/23 2002 02/18/23 2355 02/19/23 0350 02/19/23 0846  BP: (!) 155/81 (!) 151/90 (!) 156/86 (!) 174/91  Pulse: 62 (!) 58 65 64  Resp: 18 18 18 17   Temp: 97.6 F (36.4 C) 97.9 F (36.6 C) 98.6 F (37 C) 98.9 F (37.2 C)  TempSrc: Oral Oral Oral Oral  SpO2: 95% 99% 98% 100%  Weight:      Height:       Supplemental O2: Room Air SpO2: 100 %  Filed Weights   02/18/23 0900 02/18/23 1800  Weight: 75.4 kg 74.8 kg     Intake/Output Summary (Last 24 hours) at 02/19/2023 1114 Last data filed at 02/19/2023 1610 Gross per 24 hour  Intake 360 ml  Output --  Net 360 ml   Net IO Since Admission: 360 mL [02/19/23 1114]  Physical Exam: General: Pleasant, elderly appearing male laying in bed. No acute distress. CV: Regular rate, rhythm.  Pulmonary: Lungs CTAB. Normal effort.  Extremities: Normal bulk and tone, no hip pain elicited with internal and external rotation.  Skin: Warm and dry.  Neuro: A&Ox3. Moves all extremities spontaneously. Normal sensation.  5/5 strength bilateral upper and lower extremities. CN II-XII intact, although slight left facial droop appreciated. Normal cerebellar function.  Psych: Normal mood and affect    ASSESSMENT/PLAN:  Assessment: Principal Problem:   Cerebral infarction (HCC) Active Problems:   Severe hypertension   ESRD (end stage renal disease) on dialysis (HCC)   A-fib (HCC)   Seizure  disorder (HCC)   Plan:  Acute cerebral infarctions within right corona radiata Two acute infarcts noted within R corona radiata, measuring up to 13 mm on MRI. Unclear if embolic or thrombotic in nature, although patient did report to neurology that he was not taking his eliquis daily. CT angio head/neck without large vessel occlusion, although there is atherosclerosis at the carotid bifurcation and involving the proximal ICA (without greater than 50% stenosis). LDL at goal, 68. A1c 5.3%. Patient has worked with OT thus far today and is recommended to go to CIR for rehab.  - Continue to work with PT/OT - Consult placed for CIR evaluation  - Eliquis 5 mg BID - Atorvastatin 40 mg daily - Follow up Echo - Continue telemetry monitoring  Paroxysmal atrial fibrillation Currently being treated with amiodarone 200 mg daily, diltiazem 180 mg daily, and eliquis. Amio and dilt were held on admission, in the setting of permissive HTN, however, will restart amiodarone today as that should not affect BP. Patient is in NSR.  - Amiodarone 200 mg daily - Hold dilt, will resume when safe to gradually lower BP  Seizure Disorder First documented back in 2023 and the patient is prescribed Keppra 500 mg BID. He had some seizure like activity on arrival and was given ativan. EEG overnight was negative for seizures/epileptiform activity. - Continue Keppra 500 mg BID - Additional 250 mg Keppra to  be given on TThS after HD  ESRD on HD TThS Anemia of chronic disease Patient missed HD yesterday as he presented to the hospital. Nephrology was consulted with plans to dialyze today. No hyperkalemia to suggest urgent need for HD   Hypertension  Chronic with current home medications of Coreg, diltiazem, and clonidine. Currently holding antihypertensives.  - Continue to hold hypertensives   Best Practice: Diet: Regular diet IVF: Fluids: none VTE: Eliquis Code: Full AB: None Therapy Recs: CIR Family Contact:  Sister, Netta Corrigan, to be notified. DISPO: Anticipated discharge to  CIR  pending  Bed availability .  Signature: Morrie Sheldon, M.D.  Internal Medicine Resident, PGY-1 [facility]  Pager: [phone] 11:14 AM, 02/19/2023   Please contact the on call pager after 5 pm and on weekends at [phone].

## 2023-02-19 NOTE — Progress Notes (Signed)
Inpatient Rehab Admissions Coordinator:  ? ?Per therapy recommendations,  patient was screened for CIR candidacy by Mahamadou Weltz, MS, CCC-SLP. At this time, Pt. Appears to be a a potential candidate for CIR. I will place   order for rehab consult per protocol for full assessment. Please contact me any with questions. ? ?Romari Gasparro, MS, CCC-SLP ?Rehab Admissions Coordinator  ?[phone] (celll) ?[phone] (office) ? ?

## 2023-02-19 NOTE — PMR Pre-admission (Signed)
PMR Admission Coordinator Pre-Admission Assessment  Patient: Tanner Rollins is an 72 y.o., male MRN: [id number] DOB: March 13, 1951 Height: 6' (182.9 cm) Weight: 74.8 kg  Insurance Information HMO:     PPO:      PCP:      IPA:      80/20:      OTHER:  PRIMARY: [facility]       Policy#:  [id number]         Subscriber: Pt CM Name: Cassie       Phone#:  [phone]      Fax#: [phone] Pt. [facility] for [facility] on [facility]/19/23 for 30 days Pre-Cert#: [id number]      Employer:  Benefits:  Phone #:      Name:   Eff Date: 01/10/2018 - still active      Deduct:       Out of Pocket Max:       Life Max:  CIR:  100%     SNF:  Outpatient:      Co-Pay:  Home Health:       Co-Pay:  DME:      Co-Pay:  Providers: in network   SECONDARY: Medicare Part A       Policy#: [id number]      Phone#:   Artist:       Phone#:   The Data processing manager" for patients in Inpatient Rehabilitation Facilities with attached "Privacy Act Statement-Health Care Records" was provided and verbally reviewed with: Patient  Emergency Contact Information Contact Information     Name Relation Home Work Mobile   Tanner Rollins Sister   [phone]       Current Medical History  Patient Admitting Diagnosis: CVA History of Present Illness: Tanner Rollins is a 72 y.o. with a pertinent PMH of ESRD on HD TThS, afib, and seizure disorder, who presented with limb weakness and and presented to the [facility] ED 02/18/23 due to weakness  and syncopal episode and admitted with for acute infarctions in right corona radiata and seizure activity.  In the ED, neurology was consulted for stroke evaluation. Pt was given 2 mg IV ativan and loaded with Keppra after the seizure episode. MRI showed Two acute infarcts noted within R corona radiata, measuring up to 13 mm on MRI. Unclear if embolic or thrombotic in nature, although patient did report to neurology that he was not taking his eliquis daily. CT angio  head/neck without large vessel occlusion, although there is atherosclerosis at the carotid bifurcation and involving the proximal ICA (without greater than 50% stenosis). LDL at goal, 68. A1c 5.3%. Pt. Seen by PT/OT/SLP and they recommend CIR to assist return to PLOF.   Complete NIHSS TOTAL: 2  Patient's medical record from [facility]  has been reviewed by the rehabilitation admission coordinator and physician.  Past Medical History  Past Medical History:  Diagnosis Date   ESRD on hemodialysis (HCC)    [facility]   Hypertension    PAF (paroxysmal atrial fibrillation) (HCC)     Has the patient had major surgery during 100 days prior to admission? No  Family History   family history is not on file.  Current Medications  Current Facility-Administered Medications:    (feeding supplement) PROSource Plus liquid 30 mL, 30 mL, Oral, BID BM, Julien Nordmann, PA-C, 30 mL at 02/19/23 1234   acetaminophen (TYLENOL) tablet 650 mg, 650 mg, Oral, Q6H PRN, 650 mg at 02/19/23 0549 **OR** acetaminophen (TYLENOL) suppository  650 mg, 650 mg, Rectal, Q6H PRN, Gwenevere Abbot, MD   amiodarone (PACERONE) tablet 200 mg, 200 mg, Oral, Daily, Atway, Rayann N, DO, 200 mg at 02/19/23 1234   apixaban (ELIQUIS) tablet 5 mg, 5 mg, Oral, BID, Gwenevere Abbot, MD, 5 mg at 02/19/23 0930   atorvastatin (LIPITOR) tablet 40 mg, 40 mg, Oral, Daily, Gwenevere Abbot, MD, 40 mg at 02/19/23 0930   Chlorhexidine Gluconate Cloth 2 % PADS 6 each, 6 each, Topical, Daily, Tyson Alias, MD   [START ON 02/20/2023] levETIRAcetam (KEPPRA) 250 mg in sodium chloride 0.9 % 100 mL IVPB, 250 mg, Intravenous, Once per day on Tue Thu Sat, Khan, Carylon Perches, MD   levETIRAcetam (KEPPRA) IVPB 500 mg/100 mL premix, 500 mg, Intravenous, Q12H, Gwenevere Abbot, MD, Last Rate: 400 mL/hr at 02/19/23 0838, 500 mg at 02/19/23 1610   senna-docusate (Senokot-S) tablet 1 tablet, 1 tablet, Oral, QHS PRN, Gwenevere Abbot, MD   sevelamer  carbonate (RENVELA) tablet 1,600 mg, 1,600 mg, Oral, TID WC, Julien Nordmann, PA-C, 1,600 mg at 02/19/23 1234   sodium chloride flush (NS) 0.9 % injection 3 mL, 3 mL, Intravenous, Once, Gerhard Munch, MD  Patients Current Diet:  Diet Order             Diet renal with fluid restriction Fluid restriction: 1200 mL Fluid; Room service appropriate? Yes; Fluid consistency: Thin  Diet effective now                   Precautions / Restrictions Precautions Precautions: Fall Restrictions Weight Bearing Restrictions: No   Has the patient had 2 or more falls or a fall with injury in the past year? Yes  Prior Activity Level Community (5-7x/wk): Pt. active in the community PTA  Prior Functional Level Self Care: Did the patient need help bathing, dressing, using the toilet or eating? Independent  Indoor Mobility: Did the patient need assistance with walking from room to room (with or without device)? Independent  Stairs: Did the patient need assistance with internal or external stairs (with or without device)? Independent  Functional Cognition: Did the patient need help planning regular tasks such as shopping or remembering to take medications? Independent  Patient Information Are you of Hispanic, Latino/a,or Spanish origin?: A. No, not of Hispanic, Latino/a, or Spanish origin What is your race?: B. Black or African American Do you need or want an interpreter to communicate with a doctor or health care staff?: 1. Yes  Patient's Response To:  Health Literacy and Transportation Is the patient able to respond to health literacy and transportation needs?: Yes Health Literacy - How often do you need to have someone help you when you read instructions, pamphlets, or other written material from your doctor or pharmacy?: Never In the past 12 months, has lack of transportation kept you from medical appointments or from getting medications?: No In the past 12 months, has lack of  transportation kept you from meetings, work, or from getting things needed for daily living?: No  Journalist, newspaper / Equipment Home Assistive Devices/Equipment: Grab bars in shower, Environmental consultant (specify type) Home Equipment: Agricultural consultant (2 wheels), [employer] - single point, Wheelchair - manual, Grab bars - tub/shower, Hand held shower head (has an emergency pull cord in bathroom and bedroom)  Prior Device Use: Indicate devices/aids used by the patient prior to current illness, exacerbation or injury? Manual wheelchair and Walker  Current Functional Level Cognition  Overall Cognitive Status: Impaired/Different from baseline Current Attention Level: Selective Orientation Level:  Oriented X4 Safety/Judgement: Decreased awareness of safety, Decreased awareness of deficits General Comments: more difficulty with higher level cogniive tasks in dynamic activites and as pt fatigued increasing his risk of falls    Extremity Assessment (includes Sensation/Coordination)  Upper Extremity Assessment: LUE deficits/detail LUE Deficits / Details: AROM WFL; strength generally 4/5 throughout; aparent coordination deficits; unaware at times that L hand was not on RW LUE Sensation: decreased proprioception LUE Coordination: decreased fine motor  Lower Extremity Assessment: Defer to PT evaluation RLE Deficits / Details: 4+/5 thorughout hip flex, knee ext/flex, DF; AROM WFL. RLE Sensation: WNL RLE Coordination: WNL LLE Deficits / Details: 4+/5 throughout hip flex, knee ext/flex, DF; AROM WFL. some coordination deficits noted with LT LE placement during gait. LLE Sensation: decreased proprioception LLE Coordination: decreased gross motor    ADLs  Overall ADL's : Needs assistance/impaired Eating/Feeding: Set up Grooming: Minimal assistance, Standing Upper Body Bathing: Set up, Sitting Lower Body Bathing: Minimal assistance, Sit to/from stand Upper Body Dressing : Set up, Supervision/safety, Sitting Lower  Body Dressing: Minimal assistance, Sit to/from stand Lower Body Dressing Details (indicate cue type and reason): unaware brief was not pulled up on L Toilet Transfer: Minimal assistance, Regular Toilet, Rolling walker (2 wheels), Grab bars Toileting- Clothing Manipulation and Hygiene: Minimal assistance Functional mobility during ADLs: Minimal assistance, Cueing for safety, Cueing for sequencing, Rolling walker (2 wheels)    Mobility  Overal bed mobility: Needs Assistance Bed Mobility: Supine to Sit Supine to sit: Supervision General bed mobility comments: sup for safety, HOB elevated    Transfers  Overall transfer level: Needs assistance Equipment used: Rolling walker (2 wheels) Transfers: Sit to/from Stand Sit to Stand: Min assist    Ambulation / Gait / Stairs / Psychologist, prison and probation services  Ambulation/Gait Ambulation/Gait assistance: Editor, commissioning (Feet): 110 Feet Assistive device: Rolling walker (2 wheels) Gait Pattern/deviations: Step-through pattern, Decreased step length - left, Decreased stance time - left, Knee flexed in stance - left, Narrow base of support Gait velocity: decr    Posture / Balance Balance Overall balance assessment: Needs assistance Sitting-balance support: Feet supported Sitting balance-Leahy Scale: Fair Standing balance support: During functional activity, Reliant on assistive device for balance, Bilateral upper extremity supported Standing balance-Leahy Scale: Poor    Special needs/care consideration Dialysis: Hemodialysis Tuesday, Thursday, and Saturday   Previous Home Environment (from acute therapy documentation) Living Arrangements: Alone Available Help at Discharge: Family, Available 24 hours/day (sister checks on him 3x/day) Type of Home: Apartment Home Layout: One level Home Access: Engineer, maintenance (IT) Shower/Tub: Walk-in shower, Sponge bathes at baseline [employer]: Standard Bathroom Accessibility: Yes How Accessible: Accessible  via walker Home Care Services: Yes  Discharge Living Setting Plans for Discharge Living Setting: Patient's home Type of Home at Discharge: House Discharge Home Layout: One level Discharge Home Access: Elevator Discharge Bathroom Shower/Tub: Walk-in shower Discharge Bathroom Toilet: Standard Discharge Bathroom Accessibility: Yes How Accessible: Accessible via walker Does the patient have any problems obtaining your medications?: No  Social/Family/Support Systems Patient Roles: Other (Comment) Contact Information: [phone] Anticipated Caregiver: Jameire Hoots (sister) can check on him intermittently. Pt.'s girlfriend Harriett Sine is planning to move in with him Ability/Limitations of Caregiver: Min A Caregiver Availability: 24/7 Discharge Plan Discussed with Primary Caregiver: Yes Is Caregiver In Agreement with Plan?: Yes Does Caregiver/Family have Issues with Lodging/Transportation while Pt is in Rehab?: No  Goals Patient/Family Goal for Rehab: PT/OT/SLP Supervision Expected length of stay: 7-10 days Pt/Family Agrees to Admission and willing to participate: Yes Program Orientation Provided &  Reviewed with Pt/Caregiver Including Roles  & Responsibilities: Yes  Decrease burden of Care through IP rehab admission: not anticipated  Possible need for SNF placement upon discharge: not anticipated  Patient Condition: I have reviewed medical records from [facility] , spoken with CM, and patient. I met with patient at the bedside for inpatient rehabilitation assessment.  Patient will benefit from ongoing PT, OT, and SLP, can actively participate in 3 hours of therapy a day 5 days of the week, and can make measurable gains during the admission.  Patient will also benefit from the coordinated team approach during an Inpatient Acute Rehabilitation admission.  The patient will receive intensive therapy as well as Rehabilitation physician, nursing, social worker, and care  management interventions.  Due to safety, skin/wound care, disease management, medication administration, pain management, and patient education the patient requires 24 hour a day rehabilitation nursing.  The patient is currently min A  with mobility and basic ADLs.  Discharge setting and therapy post discharge at home with home health is anticipated.  Patient has agreed to participate in the Acute Inpatient Rehabilitation Program and will admit today.  Preadmission Screen Completed By:  Jeronimo Greaves, 02/19/2023 2:02 PM ______________________________________________________________________   Discussed status with Dr. Riley Kill on 02/21/23  at 930 and received approval for admission today.  Admission Coordinator:  Jeronimo Greaves, CCC-SLP, time 1017/Date 02/21/23   Assessment/Plan: Diagnosis: CVA Does the need for close, 24 hr/day Medical supervision in concert with the patient's rehab needs make it unreasonable for this patient to be served in a less intensive setting? Yes Co-Morbidities requiring supervision/potential complications: ESRD on HD, afib, sz disorder Due to bladder management, bowel management, safety, skin/wound care, disease management, medication administration, pain management, and patient education, does the patient require 24 hr/day rehab nursing? Yes Does the patient require coordinated care of a physician, rehab nurse, PT, OT, and SLP to address physical and functional deficits in the context of the above medical diagnosis(es)? Yes Addressing deficits in the following areas: balance, endurance, locomotion, strength, transferring, bowel/bladder control, bathing, dressing, feeding, grooming, toileting, cognition, and speech Can the patient actively participate in an intensive therapy program of at least 3 hrs of therapy 5 days a week? Yes The potential for patient to make measurable gains while on inpatient rehab is excellent Anticipated functional outcomes upon discharge from inpatient  rehab: supervision PT, supervision OT, supervision SLP Estimated rehab length of stay to reach the above functional goals is: 7-10 days Anticipated discharge destination: Home 10. Overall Rehab/Functional Prognosis: excellent   MD Signature: Ranelle Oyster, MD, [facility] Physical Medicine & Rehabilitation Medical Director Rehabilitation Services 02/21/2023

## 2023-02-19 NOTE — Progress Notes (Signed)
  Echocardiogram 2D Echocardiogram has been performed.  Tanner Rollins 02/19/2023, 2:36 PM

## 2023-02-19 NOTE — Progress Notes (Signed)
Clinical update:  The patient was admitted with acute cerebral infarctions within the right corona radiata. He is medically stable for discharge to CIR and he would greatly benefit from the intense physical therapy that CIR offers.

## 2023-02-20 DIAGNOSIS — G40909 Epilepsy, unspecified, not intractable, without status epilepticus: Secondary | ICD-10-CM | POA: Diagnosis not present

## 2023-02-20 DIAGNOSIS — I63411 Cerebral infarction due to embolism of right middle cerebral artery: Secondary | ICD-10-CM | POA: Diagnosis not present

## 2023-02-20 DIAGNOSIS — I480 Paroxysmal atrial fibrillation: Secondary | ICD-10-CM | POA: Diagnosis not present

## 2023-02-20 DIAGNOSIS — I120 Hypertensive chronic kidney disease with stage 5 chronic kidney disease or end stage renal disease: Secondary | ICD-10-CM | POA: Diagnosis not present

## 2023-02-20 LAB — RENAL FUNCTION PANEL
Albumin: 3.3 g/dL — ABNORMAL LOW (ref 3.5–5.0)
Anion gap: 20 — ABNORMAL HIGH (ref 5–15)
BUN: 54 mg/dL — ABNORMAL HIGH (ref 8–23)
CO2: 22 mmol/L (ref 22–32)
Calcium: 9.2 mg/dL (ref 8.9–10.3)
Chloride: 93 mmol/L — ABNORMAL LOW (ref 98–111)
Creatinine, Ser: 16.03 mg/dL — ABNORMAL HIGH (ref 0.61–1.24)
GFR, Estimated: 3 mL/min — ABNORMAL LOW (ref >60)
Glucose, Bld: 77 mg/dL (ref 70–99)
Phosphorus: 7.5 mg/dL — ABNORMAL HIGH (ref 2.5–4.6)
Potassium: 4.8 mmol/L (ref 3.5–5.1)
Sodium: 135 mmol/L (ref 135–145)

## 2023-02-20 LAB — CBC
HCT: 30.5 % — ABNORMAL LOW (ref 39.0–52.0)
Hemoglobin: 10.5 g/dL — ABNORMAL LOW (ref 13.0–17.0)
MCH: 31.3 pg (ref 26.0–34.0)
MCHC: 34.4 g/dL (ref 30.0–36.0)
MCV: 91 fL (ref 80.0–100.0)
Platelets: 206 10*3/uL (ref 150–400)
RBC: 3.35 MIL/uL — ABNORMAL LOW (ref 4.22–5.81)
RDW: 15.8 % — ABNORMAL HIGH (ref 11.5–15.5)
WBC: 6.7 10*3/uL (ref 4.0–10.5)
nRBC: 0 % (ref 0.0–0.2)

## 2023-02-20 LAB — HEPATITIS B SURFACE ANTIBODY, QUANTITATIVE: Hep B S AB Quant (Post): 4390 m[IU]/mL

## 2023-02-20 LAB — GLUCOSE, CAPILLARY: Glucose-Capillary: 87 mg/dL (ref 70–99)

## 2023-02-20 MED ORDER — PENTAFLUOROPROP-TETRAFLUOROETH EX AERO
INHALATION_SPRAY | CUTANEOUS | Status: AC
  Filled 2023-02-20: qty 30

## 2023-02-20 NOTE — Progress Notes (Signed)
SLP Cancellation Note  Patient Details Name: Tanner Rollins MRN: [id number] DOB: 1951-01-22   Cancelled treatment:        Pt in HD. Will continue efforts.    Royce Macadamia 02/20/2023, 11:14 AM

## 2023-02-20 NOTE — Plan of Care (Signed)

## 2023-02-20 NOTE — Procedures (Signed)
Patient was seen on dialysis and the procedure was supervised.  BFR 400  Via AVF BP is  172/83. Increase UF goal to 1.5 L.   Patient appears to be tolerating treatment well.  Tanner Rollins Jaynie Collins 02/20/2023

## 2023-02-20 NOTE — Progress Notes (Signed)
PT Cancellation Note  Patient Details Name: Skilar Lefrancois MRN: [id number] DOB: 04-Jun-1951   Cancelled Treatment:    Reason Eval/Treat Not Completed: Patient at procedure or test/unavailable (pt at dialysis, will follow up at later date/time as schedule allows and pt able.)    Renaldo Fiddler PT, DPT Acute Rehabilitation Services Office [phone]  02/20/23 12:23 PM

## 2023-02-20 NOTE — Progress Notes (Signed)
HD#2 SUBJECTIVE:  Patient Summary: Tanner Rollins is a 72 y.o. with a pertinent PMH of ESRD on HD TThS, afib, and seizure disorder, who presented with limb weakness and admitted for acute infarctions in right corona radiata and seizure activity.   Overnight Events: No acute events overnight  Interim History: The patient was evaluated at the bedside. Patient states he had a HA overnight that resolved but denies N/V, vision changes, weakness, or other concerns. He has been able to tolerate food and liquid.   OBJECTIVE:  Vital Signs: Vitals:   02/20/23 1031 02/20/23 1102 02/20/23 1130 02/20/23 1200  BP: (!) 172/83 (!) 172/77 (!) 172/84   Pulse: 63 62 62 66  Resp: 11 (!) 21 13 18   Temp:      TempSrc:      SpO2: 96% 93% 93% 93%  Weight:      Height:       Supplemental O2: Room Air SpO2: 93 %  Filed Weights   02/18/23 0900 02/18/23 1800  Weight: 75.4 kg 74.8 kg     Intake/Output Summary (Last 24 hours) at 02/20/2023 1409 Last data filed at 02/20/2023 0500 Gross per 24 hour  Intake 320 ml  Output 25 ml  Net 295 ml    Net IO Since Admission: 655 mL [02/20/23 1409]  Physical Exam: General: Pleasant, elderly appearing male laying in bed. No acute distress. CV: Regular rate, rhythm.  Pulmonary: Lungs CTAB. Normal effort.  Extremities: Normal bulk and tone Skin: Warm and dry.  Neuro: A&Ox3. Moves all extremities spontaneously. Normal sensation.  Psych: Normal mood and affect  ASSESSMENT/PLAN:  Assessment: Principal Problem:   Cerebral infarction (HCC) Active Problems:   Severe hypertension   ESRD (end stage renal disease) on dialysis (HCC)   A-fib (HCC)   Seizure disorder (HCC)   Plan:  Acute cerebral infarctions within right corona radiata Two acute infarcts noted within R corona radiata, measuring up to 13 mm on MRI. Unclear if embolic or thrombotic in nature, although patient did report to neurology that he was not taking his eliquis daily. CT angio head/neck  without large vessel occlusion, although there is atherosclerosis at the carotid bifurcation and involving the proximal ICA (without greater than 50% stenosis). LDL at goal, 68. A1c 5.3%. Echo demonstrated no concerns for stroke source. On 7/3, pt evaluated by OT and recommended to go to CIR for rehab. On 7/4, evaluated by speech pathology.  - Continue to work with PT/OT - Received insurance approval for CIR and awaiting bed availability  - Eliquis 5 mg BID - Atorvastatin 40 mg daily - Continue telemetry monitoring - Per speech pathology, continue regular texture, thin liquids, pills with water and no further ST needed.   Paroxysmal atrial fibrillation Currently being treated with amiodarone 200 mg daily, diltiazem 180 mg daily, and eliquis. Amio and dilt were held on admission, in the setting of permissive HTN. Amiodarone restarted 7/3 as that should not affect BP. - Amiodarone 200 mg daily - Hold dilt, will resume when safe to gradually lower BP  Seizure Disorder First documented back in 2023 and the patient is prescribed Keppra 500 mg BID. He had some seizure like activity on arrival and was given ativan. EEG was negative for seizures/epileptiform activity. - Continue Keppra 500 mg BID - Additional 250 mg Keppra to be given on TThS after HD  ESRD on HD TThS Anemia of chronic disease Patient missed HD as he presented to the hospital. Received HD on 7/4  Hypertension  Chronic with current home medications of Coreg, diltiazem, and clonidine. Currently holding antihypertensives.  - Continue to hold hypertensives   Best Practice: Diet: Regular diet IVF: Fluids: none VTE: Eliquis Code: Full AB: None Therapy Recs: CIR Family Contact: Sister, Netta Corrigan, to be notified. DISPO: Anticipated discharge to  CIR  pending  Bed availability .  Signature: Morrie Sheldon, M.D.  Internal Medicine Resident, PGY-1 [facility]  Pager: [phone] 2:09 PM, 02/20/2023    Please contact the on call pager after 5 pm and on weekends at [phone].

## 2023-02-20 NOTE — Progress Notes (Signed)
Speech Language Pathology Treatment: Dysphagia  Patient Details Name: Tanner Rollins MRN: [id number] DOB: 1951/02/18 Today's Date: 02/20/2023 Time: 7829-5621 SLP Time Calculation (min) (ACUTE ONLY): 8 min  Assessment / Plan / Recommendation Clinical Impression  Pt seen after yesterday's swallow assessment where he had coughing episode. Today he was seen after dialysis with straw sips water and graham cracker. Oropharyngeal swallow was timely and efficient without indications of airway compromise across several trials. Pt states he coughs sometimes but has not recently. Recommend continue regular texture, thin liquids, pills with water and no further ST needed.    HPI HPI: Pt is a 72 year old man with PMHx of Seizure disorder c/b status epilepticus, HTN, ESRD on HD, PAF on AC who presented to the ED via EMS after weakness and a syncopal episode and had a seizure en-route to the ED and repeat seizure activity in the ED. Stroke was suspected 07/2022. MRI two acute infarcts within the right corona radia, scattered chronic microhemorrhages within supratentorial and infratentorial brain. chronic small vessel ischemic disease and chronic infarcts from MRI 07/2022      SLP Plan  All goals met;Discharge SLP treatment due to (comment)      Recommendations for follow up therapy are one component of a multi-disciplinary discharge planning process, led by the attending physician.  Recommendations may be updated based on patient status, additional functional criteria and insurance authorization.    Recommendations  Diet recommendations: Regular;Thin liquid Liquids provided via: Straw Medication Administration: Whole meds with liquid Supervision: Patient able to self feed Postural Changes and/or Swallow Maneuvers: Seated upright 90 degrees                  Oral care BID   None Dysphagia, unspecified (R13.10)     All goals met;Discharge SLP treatment due to (comment)     Royce Macadamia  02/20/2023, 1:17 PM

## 2023-02-20 NOTE — Progress Notes (Signed)
Physical Therapy Treatment Patient Details Name: Tanner Rollins MRN: [id number] DOB: Dec 18, 1950 Today's Date: 02/20/2023   History of Present Illness Patient is 72 y.o. male who presented to the ED via EMS after weakness and a syncopal episode and had a seizure en-route to the ED and repeat seizure activity in the ED. MRI work-up revealed two acute infarcts within the right corona radiata as well as background parenchymal atrophy, chronic small vessel ischemic disease and chronic infarcts. PMH significant for ESRD TTS, HTN, Afib with RVR, prior respiratory failure, metabolic bone disease.    PT Comments  Patient resting in bed and reports mild fatigue from HD earlier today but agreeable to therapy. Pt ambulated ~ 125' with min assist, as pt fatigues noted to have decreased Lt LE strength with decreased hip flexion, dorsiflexion and shortened step length. EOS pt completed standing LE box/cone taps for coordination and mini-squats for functional LE strengthening. Pt requesting to remain seated EOB at EOS and call bell within reach. Pt verbalized understanding to call for assist. NT notified bed alarm will not turn on. Will continue to progress pt as able.    Assistance Recommended at Discharge Intermittent Supervision/Assistance  If plan is discharge home, recommend the following:  Can travel by private vehicle    A little help with walking and/or transfers;A little help with bathing/dressing/bathroom;Assistance with cooking/housework;Direct supervision/assist for medications management;Assist for transportation;Help with stairs or ramp for entrance      Equipment Recommendations  None recommended by PT    Recommendations for Other Services Rehab consult     Precautions / Restrictions Precautions Precautions: Fall Restrictions Weight Bearing Restrictions: No     Mobility  Bed Mobility Overal bed mobility: Needs Assistance Bed Mobility: Supine to Sit     Supine to sit:  Supervision     General bed mobility comments: sup for safety, HOB elevated. EOS pt sitting EOB, NT aware.    Transfers Overall transfer level: Needs assistance Equipment used: Rolling walker (2 wheels) Transfers: Sit to/from Stand Sit to Stand: Min assist           General transfer comment: cues for hand placement and assist to power up from low bed height, fading to min guard from elevated height.    Ambulation/Gait Ambulation/Gait assistance: Min assist Gait Distance (Feet): 125 Feet Assistive device: Rolling walker (2 wheels) Gait Pattern/deviations: Step-through pattern, Decreased step length - left, Decreased stance time - left, Knee flexed in stance - left, Narrow base of support Gait velocity: decr     General Gait Details: pt required min assist and intermittent cues to maintain close proximity to RW. as pt fatigued Lt LE with decreased hip flexion/DF and step length.   Stairs             Wheelchair Mobility     Tilt Bed    Modified Rankin (Stroke Patients Only)       Balance Overall balance assessment: Needs assistance Sitting-balance support: Feet supported Sitting balance-Leahy Scale: Fair     Standing balance support: During functional activity, Reliant on assistive device for balance, Bilateral upper extremity supported Standing balance-Leahy Scale: Poor                              Cognition Arousal/Alertness: Awake/alert Behavior During Therapy: WFL for tasks assessed/performed Overall Cognitive Status: Impaired/Different from baseline Area of Impairment: Attention, Safety/judgement, Awareness, Problem solving, Memory  Current Attention Level: Selective Memory: Decreased short-term memory   Safety/Judgement: Decreased awareness of deficits, Decreased awareness of safety Awareness: Emergent Problem Solving: Slow processing, Difficulty sequencing          Exercises Other Exercises Other  Exercises: bil LE box taps with RW for support, 1x10 reps ipsilateral tap, 1x10 reps contralateral tap. pt with greatest difficulty coordinating Lt contralateral box taps. Other Exercises: 10x mini-squat sit<>stands with RW for support    General Comments        Pertinent Vitals/Pain Pain Assessment Faces Pain Scale: Hurts a little bit Pain Location: Lt hip Pain Descriptors / Indicators: Discomfort    Home Living                          Prior Function            PT Goals (current goals can now be found in the care plan section) Acute Rehab PT Goals Patient Stated Goal: regain independence PT Goal Formulation: With patient Time For Goal Achievement: 03/05/23 Potential to Achieve Goals: Good Progress towards PT goals: Progressing toward goals    Frequency    Min 4X/week      PT Plan Current plan remains appropriate    Co-evaluation   Reason for Co-Treatment: For patient/therapist safety          AM-PAC PT "6 Clicks" Mobility   Outcome Measure  Help needed turning from your back to your side while in a flat bed without using bedrails?: A Little Help needed moving from lying on your back to sitting on the side of a flat bed without using bedrails?: A Little Help needed moving to and from a bed to a chair (including a wheelchair)?: A Little Help needed standing up from a chair using your arms (e.g., wheelchair or bedside chair)?: A Little Help needed to walk in hospital room?: A Little Help needed climbing 3-5 steps with a railing? : A Lot 6 Click Score: 17    End of Session Equipment Utilized During Treatment: Gait belt Activity Tolerance: Patient tolerated treatment well Patient left: in bed;with call bell/phone within reach (unable to set bed alarm, NT notified) Nurse Communication: Mobility status PT Visit Diagnosis: Unsteadiness on feet (R26.81);Other abnormalities of gait and mobility (R26.89);Muscle weakness (generalized) (M62.81);Difficulty  in walking, not elsewhere classified (R26.2)     Time: 1610-9604 PT Time Calculation (min) (ACUTE ONLY): 20 min  Charges:    $Gait Training: 8-22 mins PT General Charges $$ ACUTE PT VISIT: 1 Visit                     Wynn Maudlin, DPT Acute Rehabilitation Services Office [phone]  02/20/23 3:54 PM

## 2023-02-20 NOTE — Progress Notes (Signed)
[facility] Progress Note   Subjective:   Seen at onset of HD - 1L UFG and tolerating, goal to keep SBP > 105 at all times. Denies CP/dyspnea.  Objective Vitals:   02/20/23 0821 02/20/23 0829 02/20/23 0900 02/20/23 0930  BP: (!) 167/80 (!) 157/76 (!) 174/86 (!) 175/80  Pulse: (!) 58 (!) 58 (!) 56 62  Resp: 13 12 15 16   Temp: 97.8 F (36.6 C)     TempSrc:      SpO2: 95% 98% 93% 93%  Weight:      Height:       Physical Exam General: Well appearing man, NAD. Room air. Heart: RRR; no murmur Lungs: CTA anteriorly Abdomen: soft Extremities: no LE edema Dialysis Access: LUE AVF + bruit  Additional Objective Labs: Basic Metabolic Panel: Recent Labs  Lab 02/18/23 0900 02/18/23 0903 02/19/23 0434 02/20/23 0153  NA 136 136 135 135  K 4.0 3.9 4.6 4.8  CL 94* 97* 93* 93*  CO2 22  --  25 22  GLUCOSE 120* 116* 93 77  BUN 33* 38* 39* 54*  CREATININE 12.75* 13.90* 14.23* 16.03*  CALCIUM 9.2  --  9.1 9.2  PHOS  --   --  8.0* 7.5*   Liver Function Tests: Recent Labs  Lab 02/18/23 0900 02/19/23 0434 02/20/23 0153  AST 18  --   --   ALT 13  --   --   ALKPHOS 87  --   --   BILITOT 0.7  --   --   PROT 6.8  --   --   ALBUMIN 3.5 3.1* 3.3*   CBC: Recent Labs  Lab 02/18/23 0900 02/18/23 0903 02/19/23 0434 02/20/23 0153  WBC 5.6  --  5.2 6.7  NEUTROABS 3.7  --   --   --   HGB 11.1* 11.9* 11.4* 10.5*  HCT 34.6* 35.0* 34.2* 30.5*  MCV 98.0  --  96.6 91.0  PLT 196  --  210 206   Studies/Results: ECHOCARDIOGRAM COMPLETE BUBBLE STUDY  Result Date: 02/19/2023    ECHOCARDIOGRAM REPORT   Patient Name:   Tanner Rollins Date of Exam: 02/19/2023 Medical Rec #:  [id number]       Height:       72.0 in Accession #:    [id number]      Weight:       165.0 lb Date of Birth:  11/23/1950       BSA:          1.963 m Patient Age:    71 years        BP:           181/88 mmHg Patient Gender: M               HR:           68 bpm. Exam Location:  Inpatient Procedure: 2D Echo,  Cardiac Doppler, Color Doppler and Saline Contrast Bubble            Study Indications:    stroke  History:        Patient has prior history of Echocardiogram examinations, most                 recent 01/24/2022. End stage renal disease, Arrythmias:Atrial                 Fibrillation; Risk Factors:Hypertension.  Sonographer:    Delcie Roch RDCS Referring Phys: [id number] Marquita Palms VINCENT IMPRESSIONS  1. Left ventricular  ejection fraction, by estimation, is 65 to 70%. The left ventricle has normal function. The left ventricle has no regional wall motion abnormalities. There is moderate left ventricular hypertrophy. Left ventricular diastolic parameters are consistent with Grade II diastolic dysfunction (pseudonormalization). Elevated left atrial pressure.  2. Right ventricular systolic function is normal. The right ventricular size is normal. Tricuspid regurgitation signal is inadequate for assessing PA pressure.  3. Left atrial size was moderately dilated.  4. The mitral valve is normal in structure. Trivial mitral valve regurgitation. No evidence of mitral stenosis.  5. The aortic valve is tricuspid. Aortic valve regurgitation is not visualized. No aortic stenosis is present.  6. The inferior vena cava is normal in size with <50% respiratory variability, suggesting right atrial pressure of 8 mmHg.  7. Agitated saline contrast bubble study was negative, with no evidence of any interatrial shunt. FINDINGS  Left Ventricle: Left ventricular ejection fraction, by estimation, is 65 to 70%. The left ventricle has normal function. The left ventricle has no regional wall motion abnormalities. The left ventricular internal cavity size was normal in size. There is  moderate left ventricular hypertrophy. Left ventricular diastolic parameters are consistent with Grade II diastolic dysfunction (pseudonormalization). Elevated left atrial pressure. Right Ventricle: The right ventricular size is normal. No increase in right  ventricular wall thickness. Right ventricular systolic function is normal. Tricuspid regurgitation signal is inadequate for assessing PA pressure. Left Atrium: Left atrial size was moderately dilated. Right Atrium: Right atrial size was normal in size. Pericardium: There is no evidence of pericardial effusion. Mitral Valve: The mitral valve is normal in structure. Trivial mitral valve regurgitation. No evidence of mitral valve stenosis. Tricuspid Valve: The tricuspid valve is normal in structure. Tricuspid valve regurgitation is trivial. Aortic Valve: The aortic valve is tricuspid. Aortic valve regurgitation is not visualized. No aortic stenosis is present. Pulmonic Valve: The pulmonic valve was not well visualized. Pulmonic valve regurgitation is not visualized. Aorta: The aortic root is normal in size and structure. Venous: The inferior vena cava is normal in size with less than 50% respiratory variability, suggesting right atrial pressure of 8 mmHg. IAS/Shunts: The interatrial septum was not well visualized. Agitated saline contrast was given intravenously to evaluate for intracardiac shunting. Agitated saline contrast bubble study was negative, with no evidence of any interatrial shunt.  LEFT VENTRICLE PLAX 2D LVIDd:         4.40 cm   Diastology LVIDs:         2.60 cm   LV e' medial:    6.09 cm/s LV PW:         1.50 cm   LV E/e' medial:  18.1 LV IVS:        1.30 cm   LV e' lateral:   8.16 cm/s LVOT diam:     2.20 cm   LV E/e' lateral: 13.5 LV SV:         107 LV SV Index:   55 LVOT Area:     3.80 cm  RIGHT VENTRICLE             IVC RV Basal diam:  3.00 cm     IVC diam: 1.60 cm RV S prime:     15.10 cm/s TAPSE (M-mode): 2.1 cm LEFT ATRIUM              Index        RIGHT ATRIUM           Index LA diam:  4.50 cm  2.29 cm/m   RA Area:     20.80 cm LA Vol (A2C):   101.0 ml 51.44 ml/m  RA Volume:   58.70 ml  29.90 ml/m LA Vol (A4C):   73.5 ml  37.44 ml/m LA Biplane Vol: 86.3 ml  43.96 ml/m  AORTIC VALVE  LVOT Vmax:   133.00 cm/s LVOT Vmean:  89.300 cm/s LVOT VTI:    0.282 m  AORTA Ao Root diam: 3.20 cm MITRAL VALVE MV Area (PHT): 4.68 cm     SHUNTS MV Decel Time: 162 msec     Systemic VTI:  0.28 m MV E velocity: 110.00 cm/s  Systemic Diam: 2.20 cm MV A velocity: 98.10 cm/s MV E/A ratio:  1.12 Epifanio Lesches MD Electronically signed by Epifanio Lesches MD Signature Date/Time: 02/19/2023/3:02:44 PM    Final    EEG adult  Result Date: 02/19/2023 Charlsie Quest, MD     02/19/2023 10:12 AM Patient Name: Tanner Rollins MRN: [id number] Epilepsy Attending: Charlsie Quest Referring Physician/Provider: Gwenevere Abbot, MD Date: 02/18/2023 Duration: 26.03 mins Patient history: 72 y.o. male with a past medical history of PAF on eliquis, seizure history- discharged 08/03/2022 on 500mg  keppra BID, HTN, ESRD on HD (fills medications through [place] in [place]) presenting with left side weakness, left facial droop, confusion. Level of alertness: Awake, asleep AEDs during EEG study: LEV Technical aspects: This EEG study was done with scalp electrodes positioned according to the 10-20 International system of electrode placement. Electrical activity was reviewed with band pass filter of 1-70Hz , sensitivity of 7 uV/mm, display speed of 65mm/sec with a 60Hz  notched filter applied as appropriate. EEG data were recorded continuously and digitally stored.  Video monitoring was available and reviewed as appropriate. Description: The posterior dominant rhythm consists of 8 Hz activity of moderate voltage (25-35 uV) seen predominantly in posterior head regions, symmetric and reactive to eye opening and eye closing. Sleep was characterized by vertex waves, sleep spindles (12 to 14 Hz), maximal frontocentral region. EEG showed continuous 3 to 6 Hz theta-delta slowing in right hemisphere. Physiologic photic driving was not seen during photic stimulation.  Hyperventilation was not performed.   ABNORMALITY - Continuous slow, right  hemisphere IMPRESSION: This study is suggestive of cortical dysfunction arising from right hemisphere likely secondary to underlying stroke. No seizures or epileptiform discharges were seen throughout the recording. Charlsie Quest   MR BRAIN WO CONTRAST  Result Date: 02/18/2023 CLINICAL DATA:  Provided history: Seizure, new onset, no history of trauma. EXAM: MRI HEAD WITHOUT CONTRAST TECHNIQUE: Multiplanar, multiecho pulse sequences of the brain and surrounding structures were obtained without intravenous contrast. COMPARISON:  Noncontrast head CT and CT angiogram head/neck 02/18/2023. Brain MRI 07/31/2022 FINDINGS: Brain: Mild generalized parenchymal atrophy. Two acute infarcts within the right corona radiata measuring up to 13 mm. Chronic cortical/subcortical infarct (with associated chronic hemosiderin deposition) in the right parietal lobe. Chronic lacunar infarcts within the bilateral cerebral hemispheric white matter, deep gray nuclei and within the pons. Chronic hemosiderin deposition associated with a chronic lacunar infarct in the right basal ganglia. Background moderate multifocal T2 FLAIR hyperintense signal abnormality within the cerebral white matter, nonspecific but compatible with chronic small vessel ischemic disease. Multiple small chronic infarcts within the left cerebellar hemisphere. Chronic microhemorrhages scattered within the bilateral cerebral hemispheres, thalami, brainstem and cerebellum. No evidence of an intracranial mass No extra-axial fluid collection. No midline shift. Vascular: Maintained flow voids within the proximal large arterial vessels. Skull and upper cervical spine: No  suspicious marrow lesion. Incompletely assessed cervical spondylosis. Sinuses/Orbits: No mass or acute finding within the imaged orbits. Mild mucosal thickening versus small mucous retention cyst within the inferior left maxillary sinus. Tiny mucous retention cysts within the bilateral sphenoid sinuses.  IMPRESSION: 1. Two acute infarcts within the right corona radiata measuring up to 13 mm. 2. Background parenchymal atrophy, chronic small vessel ischemic disease and chronic infarcts, as detailed and stable from the prior brain MRI of 07/31/2022. 3. As before, there are scattered chronic microhemorrhages within the supratentorial and infratentorial brain. Findings likely at least partly reflect sequelae of chronic hypertensive microangiopathy. However, a superimposed component of cerebral amyloid angiopathy cannot be excluded. Electronically Signed   By: Jackey Loge D.O.   On: 02/18/2023 11:56    Medications:  levETIRAcetam     levETIRAcetam 500 mg (02/20/23 0757)    (feeding supplement) PROSource Plus  30 mL Oral BID BM   amiodarone  200 mg Oral Daily   apixaban  5 mg Oral BID   atorvastatin  40 mg Oral Daily   Chlorhexidine Gluconate Cloth  6 each Topical Daily   pentafluoroprop-tetrafluoroeth       sevelamer carbonate  1,600 mg Oral TID WC   sodium chloride flush  3 mL Intravenous Once    Dialysis Orders: TTS at [facility] 4hr, 400/800, EDW 70kg, 2K/2Ca, AVF, 15g needles, heparin 3000 bolus - no recent ESA, Hgb typically > 11 - no VDRA at the moment, prev on Hectoral - held as of 6/29 for Ca 10.2   Assessment/Plan:  Acute R CVA (2 areas): Neuro following, started on Keppra.On Eliquis + HL. Appears to be back to baseline now. Being considered for CIR. ESRD:  Usual TTS schedule - missed last HD, picking up usual HD schedule today. No heparin, low UFG.  Hypertension/volume: BP high - permissive HTN. No edema.  Anemia: Hgb > 10.5, no ESA for now  Metabolic bone disease: Ca ok, Phos high - resume sevelamer as binder.  Nutrition:  Alb low, continue supps.  pA-Fib: On Eliquis + amiodarone.  Ozzie Hoyle, PA-C 02/20/2023, 9:57 AM  [employer]

## 2023-02-20 NOTE — Progress Notes (Signed)
Hemodialysis time change from July 3rd to July 4th first shift as Per Dr. Ronalee Belts.

## 2023-02-20 NOTE — Progress Notes (Signed)
Inpatient Rehab Admissions Coordinator:   We received insurance approval for CIR. I do not have a bed for this patient to admit today.  Will f/u for admit pending bed availability.   Estill Dooms, PT, DPT Admissions Coordinator [phone] 02/20/23  9:36 AM

## 2023-02-20 NOTE — Procedures (Signed)
HD Note:  Some information was entered later than the data was gathered due to patient care needs. The stated time with the data is accurate.  Received patient in bed to unit.  Alert and oriented.  Informed consent signed and in chart.   TX duration:   Patient tolerated well.  Transported back to the room  Alert, without acute distress.  Hand-off given to patient's nurse.   Access used: Left Upper Arm fistula Access issues: None  Total UF removed: 1500 ml   [facility]

## 2023-02-21 ENCOUNTER — Encounter (HOSPITAL_COMMUNITY): Payer: Self-pay | Admitting: Student in an Organized Health Care Education/Training Program

## 2023-02-21 ENCOUNTER — Other Ambulatory Visit: Payer: Self-pay

## 2023-02-21 ENCOUNTER — Encounter (HOSPITAL_COMMUNITY): Payer: Self-pay | Admitting: Physical Medicine and Rehabilitation

## 2023-02-21 ENCOUNTER — Inpatient Hospital Stay (HOSPITAL_COMMUNITY)
Admission: RE | Admit: 2023-02-21 | Discharge: 2023-03-02 | DRG: 056 | Disposition: A | Discharge status: Other Acute Inpatient Hospital | Payer: No Typology Code available for payment source | Source: Intra-hospital | Attending: Physical Medicine and Rehabilitation | Admitting: Physical Medicine and Rehabilitation

## 2023-02-21 DIAGNOSIS — I10 Essential (primary) hypertension: Secondary | ICD-10-CM

## 2023-02-21 DIAGNOSIS — N400 Enlarged prostate without lower urinary tract symptoms: Secondary | ICD-10-CM | POA: Diagnosis present

## 2023-02-21 DIAGNOSIS — I480 Paroxysmal atrial fibrillation: Secondary | ICD-10-CM | POA: Diagnosis not present

## 2023-02-21 DIAGNOSIS — Z602 Problems related to living alone: Secondary | ICD-10-CM | POA: Diagnosis present

## 2023-02-21 DIAGNOSIS — R972 Elevated prostate specific antigen [PSA]: Secondary | ICD-10-CM | POA: Diagnosis present

## 2023-02-21 DIAGNOSIS — N186 End stage renal disease: Secondary | ICD-10-CM

## 2023-02-21 DIAGNOSIS — I120 Hypertensive chronic kidney disease with stage 5 chronic kidney disease or end stage renal disease: Secondary | ICD-10-CM | POA: Diagnosis present

## 2023-02-21 DIAGNOSIS — M7062 Trochanteric bursitis, left hip: Secondary | ICD-10-CM | POA: Diagnosis not present

## 2023-02-21 DIAGNOSIS — I69322 Dysarthria following cerebral infarction: Secondary | ICD-10-CM

## 2023-02-21 DIAGNOSIS — E785 Hyperlipidemia, unspecified: Secondary | ICD-10-CM | POA: Diagnosis present

## 2023-02-21 DIAGNOSIS — R2681 Unsteadiness on feet: Secondary | ICD-10-CM | POA: Diagnosis present

## 2023-02-21 DIAGNOSIS — I612 Nontraumatic intracerebral hemorrhage in hemisphere, unspecified: Secondary | ICD-10-CM | POA: Diagnosis not present

## 2023-02-21 DIAGNOSIS — Z91148 Patient's other noncompliance with medication regimen for other reason: Secondary | ICD-10-CM | POA: Diagnosis not present

## 2023-02-21 DIAGNOSIS — I4891 Unspecified atrial fibrillation: Secondary | ICD-10-CM | POA: Diagnosis present

## 2023-02-21 DIAGNOSIS — I639 Cerebral infarction, unspecified: Secondary | ICD-10-CM | POA: Diagnosis not present

## 2023-02-21 DIAGNOSIS — Z87891 Personal history of nicotine dependence: Secondary | ICD-10-CM

## 2023-02-21 DIAGNOSIS — I619 Nontraumatic intracerebral hemorrhage, unspecified: Secondary | ICD-10-CM | POA: Diagnosis present

## 2023-02-21 DIAGNOSIS — Z992 Dependence on renal dialysis: Secondary | ICD-10-CM

## 2023-02-21 DIAGNOSIS — I611 Nontraumatic intracerebral hemorrhage in hemisphere, cortical: Secondary | ICD-10-CM | POA: Diagnosis not present

## 2023-02-21 DIAGNOSIS — Z79899 Other long term (current) drug therapy: Secondary | ICD-10-CM | POA: Diagnosis not present

## 2023-02-21 DIAGNOSIS — G4700 Insomnia, unspecified: Secondary | ICD-10-CM | POA: Diagnosis not present

## 2023-02-21 DIAGNOSIS — D631 Anemia in chronic kidney disease: Secondary | ICD-10-CM | POA: Diagnosis present

## 2023-02-21 DIAGNOSIS — M1612 Unilateral primary osteoarthritis, left hip: Secondary | ICD-10-CM | POA: Diagnosis present

## 2023-02-21 DIAGNOSIS — I69392 Facial weakness following cerebral infarction: Secondary | ICD-10-CM | POA: Diagnosis not present

## 2023-02-21 DIAGNOSIS — I69318 Other symptoms and signs involving cognitive functions following cerebral infarction: Secondary | ICD-10-CM

## 2023-02-21 DIAGNOSIS — M25552 Pain in left hip: Secondary | ICD-10-CM | POA: Diagnosis not present

## 2023-02-21 DIAGNOSIS — I69398 Other sequelae of cerebral infarction: Secondary | ICD-10-CM | POA: Diagnosis not present

## 2023-02-21 DIAGNOSIS — G40909 Epilepsy, unspecified, not intractable, without status epilepticus: Secondary | ICD-10-CM

## 2023-02-21 DIAGNOSIS — R4182 Altered mental status, unspecified: Secondary | ICD-10-CM | POA: Diagnosis not present

## 2023-02-21 DIAGNOSIS — N189 Chronic kidney disease, unspecified: Secondary | ICD-10-CM | POA: Diagnosis present

## 2023-02-21 DIAGNOSIS — I69354 Hemiplegia and hemiparesis following cerebral infarction affecting left non-dominant side: Secondary | ICD-10-CM | POA: Diagnosis not present

## 2023-02-21 DIAGNOSIS — Z5329 Procedure and treatment not carried out because of patient's decision for other reasons: Secondary | ICD-10-CM | POA: Diagnosis not present

## 2023-02-21 DIAGNOSIS — Z7189 Other specified counseling: Secondary | ICD-10-CM | POA: Diagnosis not present

## 2023-02-21 DIAGNOSIS — M898X9 Other specified disorders of bone, unspecified site: Secondary | ICD-10-CM | POA: Diagnosis present

## 2023-02-21 DIAGNOSIS — Z7901 Long term (current) use of anticoagulants: Secondary | ICD-10-CM

## 2023-02-21 DIAGNOSIS — R569 Unspecified convulsions: Secondary | ICD-10-CM | POA: Diagnosis not present

## 2023-02-21 DIAGNOSIS — I63411 Cerebral infarction due to embolism of right middle cerebral artery: Secondary | ICD-10-CM | POA: Diagnosis not present

## 2023-02-21 DIAGNOSIS — Z515 Encounter for palliative care: Secondary | ICD-10-CM | POA: Diagnosis not present

## 2023-02-21 LAB — CBC
HCT: 33.9 % — ABNORMAL LOW (ref 39.0–52.0)
Hemoglobin: 11.3 g/dL — ABNORMAL LOW (ref 13.0–17.0)
MCH: 32.3 pg (ref 26.0–34.0)
MCHC: 33.3 g/dL (ref 30.0–36.0)
MCV: 96.9 fL (ref 80.0–100.0)
Platelets: 218 10*3/uL (ref 150–400)
RBC: 3.5 MIL/uL — ABNORMAL LOW (ref 4.22–5.81)
RDW: 15.4 % (ref 11.5–15.5)
WBC: 6.4 10*3/uL (ref 4.0–10.5)
nRBC: 0 % (ref 0.0–0.2)

## 2023-02-21 LAB — RENAL FUNCTION PANEL
Albumin: 3.1 g/dL — ABNORMAL LOW (ref 3.5–5.0)
Anion gap: 16 — ABNORMAL HIGH (ref 5–15)
BUN: 34 mg/dL — ABNORMAL HIGH (ref 8–23)
CO2: 25 mmol/L (ref 22–32)
Calcium: 9.4 mg/dL (ref 8.9–10.3)
Chloride: 93 mmol/L — ABNORMAL LOW (ref 98–111)
Creatinine, Ser: 10.93 mg/dL — ABNORMAL HIGH (ref 0.61–1.24)
GFR, Estimated: 5 mL/min — ABNORMAL LOW (ref >60)
Glucose, Bld: 68 mg/dL — ABNORMAL LOW (ref 70–99)
Phosphorus: 5.3 mg/dL — ABNORMAL HIGH (ref 2.5–4.6)
Potassium: 4.7 mmol/L (ref 3.5–5.1)
Sodium: 134 mmol/L — ABNORMAL LOW (ref 135–145)

## 2023-02-21 MED ORDER — LEVETIRACETAM 250 MG PO TABS
500.0000 mg | ORAL_TABLET | Freq: Two times a day (BID) | ORAL | Status: DC
  Administered 2023-02-21 – 2023-02-22 (×2): 500 mg via ORAL
  Filled 2023-02-21 (×2): qty 2

## 2023-02-21 MED ORDER — MILK AND MOLASSES ENEMA: 1.0000 | Freq: Every day | RECTAL | Status: DC | PRN

## 2023-02-21 MED ORDER — LEVETIRACETAM 500 MG PO TABS: 500.0000 mg | ORAL_TABLET | Freq: Two times a day (BID) | ORAL | 0 refills | Status: DC

## 2023-02-21 MED ORDER — ACETAMINOPHEN 325 MG PO TABS
325.0000 mg | ORAL_TABLET | ORAL | Status: DC | PRN
  Administered 2023-02-27 – 2023-03-01 (×3): 650 mg via ORAL
  Filled 2023-02-21 (×4): qty 2

## 2023-02-21 MED ORDER — ATORVASTATIN CALCIUM 40 MG PO TABS
40.0000 mg | ORAL_TABLET | Freq: Every day | ORAL | Status: DC
  Administered 2023-02-22 – 2023-03-02 (×9): 40 mg via ORAL
  Filled 2023-02-21 (×9): qty 1

## 2023-02-21 MED ORDER — APIXABAN 5 MG PO TABS
5.0000 mg | ORAL_TABLET | Freq: Two times a day (BID) | ORAL | Status: DC
  Administered 2023-02-21 – 2023-03-02 (×18): 5 mg via ORAL
  Filled 2023-02-21 (×18): qty 1

## 2023-02-21 MED ORDER — DIPHENHYDRAMINE HCL 25 MG PO CAPS: 25.0000 mg | ORAL_CAPSULE | Freq: Four times a day (QID) | ORAL | Status: DC | PRN

## 2023-02-21 MED ORDER — SIMETHICONE 80 MG PO CHEW: 80.0000 mg | CHEWABLE_TABLET | Freq: Four times a day (QID) | ORAL | Status: DC | PRN

## 2023-02-21 MED ORDER — TRAZODONE HCL 50 MG PO TABS
25.0000 mg | ORAL_TABLET | Freq: Every evening | ORAL | Status: DC | PRN
  Administered 2023-02-22 – 2023-02-23 (×2): 50 mg via ORAL
  Filled 2023-02-21 (×2): qty 1

## 2023-02-21 MED ORDER — SODIUM CHLORIDE 0.9 % IV SOLN: 250.0000 mg | INTRAVENOUS | Status: DC

## 2023-02-21 MED ORDER — PROSOURCE PLUS PO LIQD
30.0000 mL | Freq: Two times a day (BID) | ORAL | Status: DC
  Filled 2023-02-21 (×5): qty 30

## 2023-02-21 MED ORDER — LEVETIRACETAM 250 MG PO TABS
250.0000 mg | ORAL_TABLET | ORAL | Status: DC
  Administered 2023-02-22 – 2023-03-01 (×4): 250 mg via ORAL
  Filled 2023-02-21 (×4): qty 1

## 2023-02-21 MED ORDER — CHLORHEXIDINE GLUCONATE CLOTH 2 % EX PADS
6.0000 | MEDICATED_PAD | Freq: Every day | CUTANEOUS | Status: DC
  Administered 2023-02-22: 6 via TOPICAL

## 2023-02-21 MED ORDER — ATORVASTATIN CALCIUM 40 MG PO TABS: 40.0000 mg | ORAL_TABLET | Freq: Every day | ORAL | 0 refills | Status: DC

## 2023-02-21 MED ORDER — AMIODARONE HCL 200 MG PO TABS
200.0000 mg | ORAL_TABLET | Freq: Every day | ORAL | Status: DC
  Administered 2023-02-22 – 2023-03-02 (×9): 200 mg via ORAL
  Filled 2023-02-21 (×9): qty 1

## 2023-02-21 MED ORDER — SEVELAMER CARBONATE 800 MG PO TABS
1600.0000 mg | ORAL_TABLET | Freq: Three times a day (TID) | ORAL | Status: DC
  Administered 2023-02-22 – 2023-03-02 (×23): 1600 mg via ORAL
  Filled 2023-02-21 (×24): qty 2

## 2023-02-21 MED ORDER — ALUMINUM HYDROXIDE GEL 320 MG/5ML PO SUSP: 10.0000 mL | Freq: Four times a day (QID) | ORAL | Status: DC | PRN

## 2023-02-21 MED ORDER — GUAIFENESIN-DM 100-10 MG/5ML PO SYRP: 5.0000 mL | ORAL_SOLUTION | Freq: Four times a day (QID) | ORAL | Status: DC | PRN

## 2023-02-21 MED ORDER — BISACODYL 10 MG RE SUPP: 10.0000 mg | Freq: Every day | RECTAL | Status: DC | PRN

## 2023-02-21 MED ORDER — HYDRALAZINE HCL 10 MG PO TABS
10.0000 mg | ORAL_TABLET | Freq: Four times a day (QID) | ORAL | Status: DC | PRN
  Administered 2023-02-28 – 2023-03-01 (×2): 10 mg via ORAL
  Filled 2023-02-21 (×2): qty 1

## 2023-02-21 NOTE — H&P (Signed)
Physical Medicine and Rehabilitation Admission H&P    CC: Functional deficits due to stroke.   HPI: Tanner Rollins is a 72 year old male with history of HTN, ESRD- HD MWF, BPH with elevated PSA (being worked up by [place]), A Fib, medication non-compliance  who was admitted on 02/18/23 with left sided weakness with decreased sensation, was disoriented and noted to have intermittent LUE flaccidity with intermittent right gaze deviation. He had seizure enroute to [facility] ED treated with IV ativan. Not a candidate for thrombolytic and family reported that patient had stopped taking his Keppra 6 months ago.  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  He was loaded with Keppra and EEG done revealing cortical dysfunction from right hemisphere and no seizures. Dr. Pearlean Brownie felt to be due to non-adherence to Eliquis.  2D echo showed EF 65-70% with moderate LVH, moderately dilated LA and negative bubble study.   BP continues to be labile and permissive HTN recommended with HD goal to keep SBP>105. PT/OT has been working with patient who requires min assist with ADLs,has delay in processing with decreased STM as well as cues for safe walker use. Patient was independent and driving PTA. CIR recommended due to functional decline.   Review of Systems  Constitutional:  Negative for chills and fever.  HENT:  Negative for hearing loss.   Eyes:  Negative for blurred vision and double vision.  Respiratory:  Negative for cough and shortness of breath.   Gastrointestinal:  Negative for abdominal pain, heartburn and nausea.  Genitourinary:  Negative for dysuria and frequency.  Musculoskeletal:  Negative for back pain and  myalgias.  Skin:  Negative for rash.  Neurological:  Positive for weakness.  Psychiatric/Behavioral:  The patient has insomnia.      Past Medical History:  Diagnosis Date   ESRD on hemodialysis (HCC)    [facility]   Hypertension    PAF (paroxysmal atrial fibrillation) (HCC)    Seizure disorder [facility])     Past Surgical History:  Procedure Laterality Date   ANGIOPLASTY Left 06/10/2022   Procedure: Balloon VENOPLASTY;  Surgeon: Chuck Hint, MD;  Location: [facility] OR;  Service: Vascular;  Laterality: Left;   AV FISTULA PLACEMENT Left 01/28/2022   Procedure: ARTERIOVENOUS (AV) FISTULA CREATION VS.GRAFT ARM;  Surgeon: Chuck Hint, MD;  Location: [facility] OR;  Service: Vascular;  Laterality: Left;   FISTULOGRAM Left 06/10/2022   Procedure: FISTULOGRAM;  Surgeon: Chuck Hint, MD;  Location: [facility] OR;  Service: Vascular;  Laterality: Left;   IR FLUORO GUIDE CV LINE RIGHT  01/24/2022   IR REMOVAL TUN CV CATH W/O FL  10/04/2022   IR THORACENTESIS ASP PLEURAL SPACE W/IMG GUIDE  02/12/2022   IR US GUIDE VASC ACCESS RIGHT  01/24/2022    History reviewed. No pertinent family history.   Social History:  Lives alone  in senior living apartment and was driving PTA. 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  Family check intermittently. He reports that he quit smoking about 33 years ago. His smoking use included cigarettes. He has never used smokeless tobacco. He reports that he does not drink alcohol  and does not use drugs.   Allergies: No Known Allergies   Medications Prior to Admission  Medication Sig Dispense Refill   acetaminophen (TYLENOL) 500 MG tablet Take 1,000 mg by mouth daily as needed for moderate pain, headache or fever.     amiodarone (PACERONE) 200 MG tablet Take 1 tablet (200 mg total) by mouth daily. 30 tablet 0   apixaban (ELIQUIS) 5 MG TABS tablet Take 1 tablet (5 mg total) by mouth 2 (two)  times daily. 60 tablet    atorvastatin (LIPITOR) 20 MG tablet Take 20 mg by mouth daily.     calcium acetate (PHOSLO) 667 MG capsule Take 2 capsules (1,334 mg total) by mouth 3 (three) times daily with meals.     cloNIDine (CATAPRES) 0.1 MG tablet Take 0.1 mg by mouth 2 (two) times daily.     diltiazem (CARDIZEM CD) 180 MG 24 hr capsule Take 1 capsule (180 mg total) by mouth daily.     Multiple Vitamin (MULTIVITAMIN WITH MINERALS) TABS tablet Take 1 tablet by mouth daily.     [DISCONTINUED] levETIRAcetam (KEPPRA) 500 MG tablet Take 1 tablet (500 mg total) by mouth 2 (two) times daily. (Patient not taking: Reported on 02/19/2023) 60 tablet 0      Home: Home Living Family/patient expects to be discharged to:: Private residence Living Arrangements: Alone Available Help at Discharge: Family, Available 24 hours/day (sister checks on him 3x/day) Type of Home: Apartment Home Access: Elevator Home Layout: One level Bathroom Shower/Tub: Walk-in shower, Sponge bathes at baseline Bathroom Toilet: Standard Bathroom Accessibility: Yes Home Equipment: Agricultural consultant (2 wheels), [employer] - single point, Wheelchair - manual, Grab bars - tub/shower, Hand held shower head (has an emergency pull cord in bathroom and bedroom)   Functional History: Prior Function Prior Level of Function : Independent/Modified Independent, Driving, History of Falls (last six months) Mobility Comments: reports using RW or wc "when he needs"; has fal;len a couple of times in teh past 6 months but could get up on his own  Functional Status:  Mobility: Bed Mobility Overal bed mobility: Needs Assistance Bed Mobility: Supine to Sit Supine to sit: Supervision General bed mobility comments: sup for safety, HOB elevated. EOS pt sitting EOB, NT aware. Transfers Overall transfer level: Needs assistance Equipment used: Rolling walker (2 wheels) Transfers: Sit to/from Stand Sit to Stand: Min assist General transfer comment: cues for  hand placement and assist to power up from low bed height, fading to min guard from elevated height. Ambulation/Gait Ambulation/Gait assistance: Min assist Gait Distance (Feet): 125 Feet Assistive device: Rolling walker (2 wheels) Gait Pattern/deviations: Step-through pattern, Decreased step length - left, Decreased stance time - left, Knee flexed in stance - left, Narrow base of support General Gait Details: pt required min assist and intermittent cues to maintain close proximity to RW. as pt fatigued Lt LE with decreased hip flexion/DF and step length. Gait velocity: decr    ADL: ADL Overall ADL's : Needs assistance/impaired Eating/Feeding: Set up Grooming: Minimal assistance, Standing Upper Body Bathing: Set up, Sitting Lower Body Bathing: Minimal assistance, Sit to/from stand Upper Body Dressing : Set up, Supervision/safety, Sitting Lower Body Dressing: Minimal assistance, Sit to/from stand Lower Body Dressing Details (indicate cue type and reason): unaware brief was not pulled up on L Toilet Transfer: Minimal assistance, Regular Toilet, Rolling walker (2 wheels), Grab bars Toileting- Clothing Manipulation and Hygiene: Minimal assistance Functional mobility during ADLs: Minimal assistance, Cueing for safety, Cueing for sequencing, Rolling walker (2 wheels)  Cognition:  Cognition Overall Cognitive Status: Impaired/Different from baseline Orientation Level: Oriented X4 Cognition Arousal/Alertness: Awake/alert Behavior During Therapy: WFL for tasks assessed/performed Overall Cognitive Status: Impaired/Different from baseline Area of Impairment: Attention, Safety/judgement, Awareness, Problem solving, Memory Current Attention Level: Selective Memory: Decreased short-term memory Safety/Judgement: Decreased awareness of deficits, Decreased awareness of safety Awareness: Emergent Problem Solving: Slow processing, Difficulty sequencing General Comments: more difficulty with higher  level cogniive tasks in dynamic activites and as pt fatigued increasing his risk of falls    Blood pressure (!) 190/96, pulse 73, temperature 98.5 F (36.9 C), temperature source Oral, resp. rate 16, height 6' (1.829 m), weight 74.8 kg, SpO2 96 %. Physical Exam Vitals and nursing note reviewed.  Constitutional:      Appearance: Normal appearance.  HENT:     Right Ear: External ear normal.     Left Ear: External ear normal.     Mouth/Throat:     Mouth: Mucous membranes are moist.  Eyes:     Extraocular Movements: Extraocular movements intact.     Conjunctiva/sclera: Conjunctivae normal.     Pupils: Pupils are equal, round, and reactive to light.  Cardiovascular:     Rate and Rhythm: Normal rate and regular rhythm.     Heart sounds: No murmur heard. Pulmonary:     Effort: Pulmonary effort is normal. No respiratory distress.     Breath sounds: No wheezing.  Abdominal:     General: There is no distension.     Palpations: Abdomen is soft.  Musculoskeletal:        General: No swelling or tenderness. Normal range of motion.     Cervical back: Normal range of motion.  Skin:    General: Skin is warm and dry.     Comments: Healed lesion on left forearm.   Neurological:     Mental Status: He is alert and oriented to person, place, and time.     Comments: Alert and oriented x 3. Mild left facial weakness with mild to moderate dysarthria (although pt feels that he's close to baseline from speech standpoint).  Demonstrates fair insight and awareness. Follows all basic commands. Motor: RUE 4+/5 prox to distal. LUE 4- to 4/5 prox to distal. RLE 4+/5. RLE 4/5 prox to distal. Sensory exam normal for light touch and pain RUE and RLE. May be slightly diminished LUE and LLE. No limb ataxia or cerebellar signs. No abnormal tone appreciated.    Psychiatric:        Mood and Affect: Mood normal.        Behavior: Behavior normal.     Results for orders placed or performed during the hospital  encounter of 02/18/23 (from the past 48 hour(s))  Glucose, capillary     Status: None   Collection Time: 02/19/23 12:12 PM  Result Value Ref Range   Glucose-Capillary 95 70 - 99 mg/dL    Comment: Glucose reference range applies only to samples taken after fasting for at least 8 hours.   Comment 1 Notify RN    Comment 2 Document in Chart   Hepatitis B surface antibody,quantitative     Status: None   Collection Time: 02/19/23  3:42 PM  Result Value Ref Range   Hep B S AB Quant (Post) 4,390.0 Immunity>10 mIU/mL    Comment: (NOTE) Results confirmed on dilution.  Status of Immunity                     Anti-HBs Level  ------------------                     --------------  Inconsistent with Immunity                  0.0 - 10.0 Consistent with Immunity                         >10.0 Performed At: [facility] [address] [place], [place] [id number] Jolene Schimke MD WU:[id number]   Hepatitis B surface antigen     Status: None   Collection Time: 02/19/23  3:42 PM  Result Value Ref Range   Hepatitis B Surface Ag NON REACTIVE NON REACTIVE    Comment: Performed at [facility], [address] [place], [place] [address]  Glucose, capillary     Status: None   Collection Time: 02/19/23  4:40 PM  Result Value Ref Range   Glucose-Capillary 85 70 - 99 mg/dL    Comment: Glucose reference range applies only to samples taken after fasting for at least 8 hours.  Glucose, capillary     Status: None   Collection Time: 02/19/23  8:06 PM  Result Value Ref Range   Glucose-Capillary 73 70 - 99 mg/dL    Comment: Glucose reference range applies only to samples taken after fasting for at least 8 hours.   Comment 1 Notify RN    Comment 2 Document in Chart   Renal function panel     Status: Abnormal   Collection Time: 02/20/23  1:53 AM  Result Value Ref Range   Sodium 135 135 - 145 mmol/L   Potassium 4.8 3.5 - 5.1 mmol/L   Chloride 93 (L) 98 - 111 mmol/L   CO2 22 22 - 32 mmol/L    Glucose, Bld 77 70 - 99 mg/dL    Comment: Glucose reference range applies only to samples taken after fasting for at least 8 hours.   BUN 54 (H) 8 - 23 mg/dL   Creatinine, Ser 30.86 (H) 0.61 - 1.24 mg/dL   Calcium 9.2 8.9 - 57.8 mg/dL   Phosphorus 7.5 (H) 2.5 - 4.6 mg/dL   Albumin 3.3 (L) 3.5 - 5.0 g/dL   GFR, Estimated 3 (L) >60 mL/min    Comment: (NOTE) Calculated using the CKD-EPI Creatinine Equation (2021)    Anion gap 20 (H) 5 - 15    Comment: Performed at [facility], [address] [place], [place] [address]  CBC     Status: Abnormal   Collection Time: 02/20/23  1:53 AM  Result Value Ref Range   WBC 6.7 4.0 - 10.5 K/uL   RBC 3.35 (L) 4.22 - 5.81 MIL/uL   Hemoglobin 10.5 (L) 13.0 - 17.0 g/dL   HCT 95.2 (L) 84.1 - 32.4 %   MCV 91.0 80.0 - 100.0 fL   MCH 31.3 26.0 - 34.0 pg   MCHC 34.4 30.0 - 36.0 g/dL   RDW 40.1 (H) 02.7 - 25.3 %   Platelets 206 150 - 400 K/uL   nRBC 0.0 0.0 - 0.2 %    Comment: Performed at [facility], [address], [place], [place] [address]  Glucose, capillary     Status: None   Collection Time: 02/20/23  7:35 AM  Result Value Ref Range   Glucose-Capillary 87 70 - 99 mg/dL    Comment: Glucose reference range applies only to samples taken after fasting for at least 8 hours.  CBC     Status: Abnormal   Collection Time: 02/21/23  5:33 AM  Result Value Ref Range  WBC 6.4 4.0 - 10.5 K/uL   RBC 3.50 (L) 4.22 - 5.81 MIL/uL   Hemoglobin 11.3 (L) 13.0 - 17.0 g/dL   HCT 16.1 (L) 09.6 - 04.5 %   MCV 96.9 80.0 - 100.0 fL   MCH 32.3 26.0 - 34.0 pg   MCHC 33.3 30.0 - 36.0 g/dL   RDW 40.9 81.1 - 91.4 %   Platelets 218 150 - 400 K/uL   nRBC 0.0 0.0 - 0.2 %    Comment: Performed at [facility], [address], [place], [place] [address]  Renal function panel     Status: Abnormal   Collection Time: 02/21/23  5:33 AM  Result Value Ref Range   Sodium 134 (L) 135 - 145 mmol/L   Potassium 4.7 3.5 - 5.1 mmol/L   Chloride 93 (L) 98 - 111  mmol/L   CO2 25 22 - 32 mmol/L   Glucose, Bld 68 (L) 70 - 99 mg/dL    Comment: Glucose reference range applies only to samples taken after fasting for at least 8 hours.   BUN 34 (H) 8 - 23 mg/dL   Creatinine, Ser 62.13 (H) 0.61 - 1.24 mg/dL   Calcium 9.4 8.9 - 08.6 mg/dL   Phosphorus 5.3 (H) 2.5 - 4.6 mg/dL   Albumin 3.1 (L) 3.5 - 5.0 g/dL   GFR, Estimated 5 (L) >60 mL/min    Comment: (NOTE) Calculated using the CKD-EPI Creatinine Equation (2021)    Anion gap 16 (H) 5 - 15    Comment: Performed at [facility], [address] [place], [place] [address]   ECHOCARDIOGRAM COMPLETE BUBBLE STUDY  Result Date: 02/19/2023    ECHOCARDIOGRAM REPORT   Patient Name:   Tanner Rollins Date of Exam: 02/19/2023 Medical Rec #:  [id number]       Height:       72.0 in Accession #:    [id number]      Weight:       165.0 lb Date of Birth:  February 02, 1951       BSA:          1.963 m Patient Age:    71 years        BP:           181/88 mmHg Patient Gender: M               HR:           68 bpm. Exam Location:  Inpatient Procedure: 2D Echo, Cardiac Doppler, Color Doppler and Saline Contrast Bubble            Study Indications:    stroke  History:        Patient has prior history of Echocardiogram examinations, most                 recent 01/24/2022. End stage renal disease, Arrythmias:Atrial                 Fibrillation; Risk Factors:Hypertension.  Sonographer:    Delcie Roch RDCS Referring Phys: [id number] Marquita Palms VINCENT IMPRESSIONS  1. Left ventricular ejection fraction, by estimation, is 65 to 70%. The left ventricle has normal function. The left ventricle has no regional wall motion abnormalities. There is moderate left ventricular hypertrophy. Left ventricular diastolic parameters are consistent with Grade II diastolic dysfunction (pseudonormalization). Elevated left atrial pressure.  2. Right ventricular systolic function is normal. The right ventricular size is normal. Tricuspid regurgitation signal is  inadequate for  assessing PA pressure.  3. Left atrial size was moderately dilated.  4. The mitral valve is normal in structure. Trivial mitral valve regurgitation. No evidence of mitral stenosis.  5. The aortic valve is tricuspid. Aortic valve regurgitation is not visualized. No aortic stenosis is present.  6. The inferior vena cava is normal in size with <50% respiratory variability, suggesting right atrial pressure of 8 mmHg.  7. Agitated saline contrast bubble study was negative, with no evidence of any interatrial shunt. FINDINGS  Left Ventricle: Left ventricular ejection fraction, by estimation, is 65 to 70%. The left ventricle has normal function. The left ventricle has no regional wall motion abnormalities. The left ventricular internal cavity size was normal in size. There is  moderate left ventricular hypertrophy. Left ventricular diastolic parameters are consistent with Grade II diastolic dysfunction (pseudonormalization). Elevated left atrial pressure. Right Ventricle: The right ventricular size is normal. No increase in right ventricular wall thickness. Right ventricular systolic function is normal. Tricuspid regurgitation signal is inadequate for assessing PA pressure. Left Atrium: Left atrial size was moderately dilated. Right Atrium: Right atrial size was normal in size. Pericardium: There is no evidence of pericardial effusion. Mitral Valve: The mitral valve is normal in structure. Trivial mitral valve regurgitation. No evidence of mitral valve stenosis. Tricuspid Valve: The tricuspid valve is normal in structure. Tricuspid valve regurgitation is trivial. Aortic Valve: The aortic valve is tricuspid. Aortic valve regurgitation is not visualized. No aortic stenosis is present. Pulmonic Valve: The pulmonic valve was not well visualized. Pulmonic valve regurgitation is not visualized. Aorta: The aortic root is normal in size and structure. Venous: The inferior vena cava is normal in size with less than  50% respiratory variability, suggesting right atrial pressure of 8 mmHg. IAS/Shunts: The interatrial septum was not well visualized. Agitated saline contrast was given intravenously to evaluate for intracardiac shunting. Agitated saline contrast bubble study was negative, with no evidence of any interatrial shunt.  LEFT VENTRICLE PLAX 2D LVIDd:         4.40 cm   Diastology LVIDs:         2.60 cm   LV e' medial:    6.09 cm/s LV PW:         1.50 cm   LV E/e' medial:  18.1 LV IVS:        1.30 cm   LV e' lateral:   8.16 cm/s LVOT diam:     2.20 cm   LV E/e' lateral: 13.5 LV SV:         107 LV SV Index:   55 LVOT Area:     3.80 cm  RIGHT VENTRICLE             IVC RV Basal diam:  3.00 cm     IVC diam: 1.60 cm RV S prime:     15.10 cm/s TAPSE (M-mode): 2.1 cm LEFT ATRIUM              Index        RIGHT ATRIUM           Index LA diam:        4.50 cm  2.29 cm/m   RA Area:     20.80 cm LA Vol (A2C):   101.0 ml 51.44 ml/m  RA Volume:   58.70 ml  29.90 ml/m LA Vol (A4C):   73.5 ml  37.44 ml/m LA Biplane Vol: 86.3 ml  43.96 ml/m  AORTIC VALVE LVOT Vmax:   133.00 cm/s  LVOT Vmean:  89.300 cm/s LVOT VTI:    0.282 m  AORTA Ao Root diam: 3.20 cm MITRAL VALVE MV Area (PHT): 4.68 cm     SHUNTS MV Decel Time: 162 msec     Systemic VTI:  0.28 m MV E velocity: 110.00 cm/s  Systemic Diam: 2.20 cm MV A velocity: 98.10 cm/s MV E/A ratio:  1.12 Epifanio Lesches MD Electronically signed by Epifanio Lesches MD Signature Date/Time: 02/19/2023/3:02:44 PM    Final       Blood pressure (!) 190/96, pulse 73, temperature 98.5 F (36.9 C), temperature source Oral, resp. rate 16, height 6' (1.829 m), weight 74.8 kg, SpO2 96 %.  Medical Problem List and Plan: 1. 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  Antithrombotics: -DVT/anticoagulation:  Pharmaceutical: Eliquis  -antiplatelet therapy: N/a 3. Pain Management:  tylenol prn.  4. Mood/Behavior/Sleep: LCSW to follow for evaluation and support.   --trazodone prn added for insomnia. Used tylenol PM occasionally at home.   -antipsychotic agents: N/A 5. Neuropsych/cognition: This patient is capable of making decisions on his  own behalf. 6. Skin/Wound Care: routine pressure relief measures.  7. Fluids/Electrolytes/Nutrition:  Strict I/O. 1200 cc FR/day 8. HTN: Monitor BP TID--permissive HTN for next 1-2 days.  --continue to hold diltiazem.   9. A fib: Monitor HR TID--on amiodarone and Eliquis.  10 ESRD: Schedule HD at the end of the day on TTS. 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  11. Seizures hx: Change Keppra to po with additional 250 mg on TTS after HD 12. Anemia of chronic disease: Stable. Serial CBC with HD.  13.  Dyslipidemia: HDL-29  [id number]. On Lipitor.      Jacquelynn Cree, PA-C 02/21/2023

## 2023-02-21 NOTE — H&P (Signed)
Physical Medicine and Rehabilitation Admission H&P     CC: Functional deficits due to stroke.     HPI: Tanner Rollins is a 72 year old male with history of HTN, ESRD- HD MWF, BPH with elevated PSA (being worked up by [place]), A Fib, medication non-compliance  who was admitted on 02/18/23 with left sided weakness with decreased sensation, was disoriented and noted to have intermittent LUE flaccidity with intermittent right gaze deviation. He had seizure enroute to [facility] ED treated with IV ativan. Not a candidate for thrombolytic and family reported that patient had stopped taking his Keppra 6 months ago.  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  He was loaded with Keppra and EEG done revealing cortical dysfunction from right hemisphere and no seizures. Dr. Pearlean Brownie felt to be due to non-adherence to Eliquis.  2D echo showed EF 65-70% with moderate LVH, moderately dilated LA and negative bubble study.    BP continues to be labile and permissive HTN recommended with HD goal to keep SBP>105. PT/OT has been working with patient who requires min assist with ADLs,has delay in processing with decreased STM as well as cues for safe walker use. Patient was independent and driving PTA. CIR recommended due to functional decline.    Review of Systems  Constitutional:  Negative for chills and fever.  HENT:  Negative for hearing loss.   Eyes:  Negative for blurred vision and double vision.  Respiratory:  Negative for cough and shortness of breath.   Gastrointestinal:  Negative for abdominal pain, heartburn and nausea.  Genitourinary:  Negative for dysuria and frequency.  Musculoskeletal:  Negative for back pain and  myalgias.  Skin:  Negative for rash.  Neurological:  Positive for weakness.  Psychiatric/Behavioral:  The patient has insomnia.             Past Medical History:  Diagnosis Date   ESRD on hemodialysis (HCC)      [facility]   Hypertension     PAF (paroxysmal atrial fibrillation) (HCC)     Seizure disorder [facility])             Past Surgical History:  Procedure Laterality Date   ANGIOPLASTY Left 06/10/2022    Procedure: Balloon VENOPLASTY;  Surgeon: Chuck Hint, MD;  Location: [facility] OR;  Service: Vascular;  Laterality: Left;   AV FISTULA PLACEMENT Left 01/28/2022    Procedure: ARTERIOVENOUS (AV) FISTULA CREATION VS.GRAFT ARM;  Surgeon: Chuck Hint, MD;  Location: [facility] OR;  Service: Vascular;  Laterality: Left;   FISTULOGRAM Left 06/10/2022    Procedure: FISTULOGRAM;  Surgeon: Chuck Hint, MD;  Location: [facility] OR;  Service: Vascular;  Laterality: Left;   IR FLUORO GUIDE CV LINE RIGHT   01/24/2022   IR REMOVAL TUN CV CATH W/O FL   10/04/2022   IR THORACENTESIS ASP PLEURAL SPACE W/IMG GUIDE   02/12/2022   IR US GUIDE VASC ACCESS RIGHT   01/24/2022      History reviewed. No pertinent family history.     Social History:  Lives alone  in senior living apartment and was driving PTA. 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  Family check intermittently. He reports that he quit smoking about 33 years ago. His smoking use included cigarettes. He has never used smokeless tobacco. He reports that he does not drink alcohol and does not use drugs.     Allergies: No Known Allergies           Medications Prior to Admission  Medication Sig Dispense Refill   acetaminophen (TYLENOL) 500 MG tablet Take 1,000 mg by mouth daily as needed for moderate pain, headache or fever.       amiodarone (PACERONE) 200 MG tablet Take 1 tablet (200 mg total) by mouth daily. 30 tablet 0   apixaban (ELIQUIS) 5 MG TABS tablet  Take 1 tablet (5 mg total) by mouth 2 (two) times daily. 60 tablet     atorvastatin (LIPITOR) 20 MG tablet Take 20 mg by mouth daily.       calcium acetate (PHOSLO) 667 MG capsule Take 2 capsules (1,334 mg total) by mouth 3 (three) times daily with meals.       cloNIDine (CATAPRES) 0.1 MG tablet Take 0.1 mg by mouth 2 (two) times daily.       diltiazem (CARDIZEM CD) 180 MG 24 hr capsule Take 1 capsule (180 mg total) by mouth daily.       Multiple Vitamin (MULTIVITAMIN WITH MINERALS) TABS tablet Take 1 tablet by mouth daily.       [DISCONTINUED] levETIRAcetam (KEPPRA) 500 MG tablet Take 1 tablet (500 mg total) by mouth 2 (two) times daily. (Patient not taking: Reported on 02/19/2023) 60 tablet 0          Home: Home Living Family/patient expects to be discharged to:: Private residence Living Arrangements: Alone Available Help at Discharge: Family, Available 24 hours/day (sister checks on him 3x/day) Type of Home: Apartment Home Access: Elevator Home Layout: One level Bathroom Shower/Tub: Walk-in shower, Sponge bathes at baseline Bathroom Toilet: Standard Bathroom Accessibility: Yes Home Equipment: Agricultural consultant (2 wheels), [employer] - single point, Wheelchair - manual, Grab bars - tub/shower, Hand held shower head (has an emergency pull cord in bathroom and bedroom)   Functional History: Prior Function Prior Level of Function : Independent/Modified Independent, Driving, History of Falls (last six months) Mobility Comments: reports using RW or wc "when he needs"; has fal;len a couple of times in teh past 6 months but could get up on his own   Functional Status:  Mobility: Bed Mobility Overal bed mobility: Needs Assistance Bed Mobility: Supine to Sit Supine to sit: Supervision General bed mobility comments: sup for safety, HOB elevated. EOS pt sitting EOB, NT aware. Transfers Overall transfer level: Needs assistance Equipment used: Rolling walker (2 wheels) Transfers: Sit to/from  Stand Sit to Stand: Min assist General transfer comment: cues for hand placement and assist to power up from low bed height, fading to min guard from elevated height. Ambulation/Gait Ambulation/Gait assistance: Min assist Gait Distance (Feet): 125 Feet Assistive device: Rolling walker (2 wheels) Gait Pattern/deviations: Step-through pattern, Decreased step length - left, Decreased stance time - left, Knee flexed in stance - left, Narrow base of support General Gait Details: pt required min assist and intermittent cues to maintain close proximity to RW. as pt fatigued Lt LE with decreased hip flexion/DF and step length. Gait velocity: decr   ADL: ADL Overall ADL's : Needs assistance/impaired Eating/Feeding: Set up Grooming: Minimal assistance, Standing Upper Body Bathing: Set up, Sitting Lower Body Bathing: Minimal assistance, Sit to/from stand Upper Body Dressing : Set up, Supervision/safety, Sitting Lower Body Dressing:  Minimal assistance, Sit to/from stand Lower Body Dressing Details (indicate cue type and reason): unaware brief was not pulled up on L Toilet Transfer: Minimal assistance, Regular Toilet, Rolling walker (2 wheels), Grab bars Toileting- Clothing Manipulation and Hygiene: Minimal assistance Functional mobility during ADLs: Minimal assistance, Cueing for safety, Cueing for sequencing, Rolling walker (2 wheels)   Cognition: Cognition Overall Cognitive Status: Impaired/Different from baseline Orientation Level: Oriented X4 Cognition Arousal/Alertness: Awake/alert Behavior During Therapy: WFL for tasks assessed/performed Overall Cognitive Status: Impaired/Different from baseline Area of Impairment: Attention, Safety/judgement, Awareness, Problem solving, Memory Current Attention Level: Selective Memory: Decreased short-term memory Safety/Judgement: Decreased awareness of deficits, Decreased awareness of safety Awareness: Emergent Problem Solving: Slow processing,  Difficulty sequencing General Comments: more difficulty with higher level cogniive tasks in dynamic activites and as pt fatigued increasing his risk of falls       Blood pressure (!) 190/96, pulse 73, temperature 98.5 F (36.9 C), temperature source Oral, resp. rate 16, height 6' (1.829 m), weight 74.8 kg, SpO2 96 %. Physical Exam Vitals and nursing note reviewed.  Constitutional:      Appearance: Normal appearance.  HENT:     Right Ear: External ear normal.     Left Ear: External ear normal.     Mouth/Throat:     Mouth: Mucous membranes are moist.  Eyes:     Extraocular Movements: Extraocular movements intact.     Conjunctiva/sclera: Conjunctivae normal.     Pupils: Pupils are equal, round, and reactive to light.  Cardiovascular:     Rate and Rhythm: Normal rate and regular rhythm.     Heart sounds: No murmur heard. Bruit LUE AVF Pulmonary:     Effort: Pulmonary effort is normal. No respiratory distress.     Breath sounds: No wheezing.  Abdominal:     General: There is no distension.     Palpations: Abdomen is soft. , small abdominal hernia Musculoskeletal:        General: No swelling or tenderness. Normal range of motion.     Cervical back: Normal range of motion.  Skin:    General: Skin is warm and dry.     Comments: Healed lesion on left forearm.  LUE fistula Neurological:     Mental Status: He is alert and oriented to person, place, and time.     Comments: Alert and oriented x 3. Mild left facial weakness with mild to moderate dysarthria (although pt feels that he's close to baseline from speech standpoint).  Demonstrates fair insight and awareness. Follows all basic commands. Motor: RUE 4+/5 prox to distal. LUE 4- to 4/5 prox to distal. RLE 4+/5. RLE 4/5 prox to distal. Sensory exam normal for light touch and pain RUE and RLE. May be slightly diminished LUE and LLE. No limb ataxia or cerebellar signs. No abnormal tone appreciated.    Psychiatric:        Mood and Affect:  Mood normal.        Behavior: Behavior normal.        Lab Results Last 48 Hours        Results for orders placed or performed during the hospital encounter of 02/18/23 (from the past 48 hour(s))  Glucose, capillary     Status: None    Collection Time: 02/19/23 12:12 PM  Result Value Ref Range    Glucose-Capillary 95 70 - 99 mg/dL      Comment: Glucose reference range applies only to samples taken after fasting for at least 8 hours.  Comment 1 Notify RN      Comment 2 Document in Chart    Hepatitis B surface antibody,quantitative     Status: None    Collection Time: 02/19/23  3:42 PM  Result Value Ref Range    Hep B S AB Quant (Post) 4,390.0 Immunity>10 mIU/mL      Comment: (NOTE) Results confirmed on dilution.  Status of Immunity                     Anti-HBs Level  ------------------                     -------------- Inconsistent with Immunity                  0.0 - 10.0 Consistent with Immunity                         >10.0 Performed At: [facility] [address] [place], [place] [id number] Jolene Schimke MD XL:[id number]    Hepatitis B surface antigen     Status: None    Collection Time: 02/19/23  3:42 PM  Result Value Ref Range    Hepatitis B Surface Ag NON REACTIVE NON REACTIVE      Comment: Performed at [facility], [address], [place], [place] [address]  Glucose, capillary     Status: None    Collection Time: 02/19/23  4:40 PM  Result Value Ref Range    Glucose-Capillary 85 70 - 99 mg/dL      Comment: Glucose reference range applies only to samples taken after fasting for at least 8 hours.  Glucose, capillary     Status: None    Collection Time: 02/19/23  8:06 PM  Result Value Ref Range    Glucose-Capillary 73 70 - 99 mg/dL      Comment: Glucose reference range applies only to samples taken after fasting for at least 8 hours.    Comment 1 Notify RN      Comment 2 Document in Chart    Renal function panel     Status: Abnormal    Collection  Time: 02/20/23  1:53 AM  Result Value Ref Range    Sodium 135 135 - 145 mmol/L    Potassium 4.8 3.5 - 5.1 mmol/L    Chloride 93 (L) 98 - 111 mmol/L    CO2 22 22 - 32 mmol/L    Glucose, Bld 77 70 - 99 mg/dL      Comment: Glucose reference range applies only to samples taken after fasting for at least 8 hours.    BUN 54 (H) 8 - 23 mg/dL    Creatinine, Ser 03.47 (H) 0.61 - 1.24 mg/dL    Calcium 9.2 8.9 - 42.5 mg/dL    Phosphorus 7.5 (H) 2.5 - 4.6 mg/dL    Albumin 3.3 (L) 3.5 - 5.0 g/dL    GFR, Estimated 3 (L) >60 mL/min      Comment: (NOTE) Calculated using the CKD-EPI Creatinine Equation (2021)      Anion gap 20 (H) 5 - 15      Comment: Performed at [facility], [address], [place], [place] [address]  CBC     Status: Abnormal    Collection Time: 02/20/23  1:53 AM  Result Value Ref Range    WBC 6.7 4.0 - 10.5 K/uL    RBC 3.35 (L) 4.22 - 5.81 MIL/uL  Hemoglobin 10.5 (L) 13.0 - 17.0 g/dL    HCT 16.1 (L) 09.6 - 52.0 %    MCV 91.0 80.0 - 100.0 fL    MCH 31.3 26.0 - 34.0 pg    MCHC 34.4 30.0 - 36.0 g/dL    RDW 04.5 (H) 40.9 - 15.5 %    Platelets 206 150 - 400 K/uL    nRBC 0.0 0.0 - 0.2 %      Comment: Performed at [facility], [address], [place], [place] [address]  Glucose, capillary     Status: None    Collection Time: 02/20/23  7:35 AM  Result Value Ref Range    Glucose-Capillary 87 70 - 99 mg/dL      Comment: Glucose reference range applies only to samples taken after fasting for at least 8 hours.  CBC     Status: Abnormal    Collection Time: 02/21/23  5:33 AM  Result Value Ref Range    WBC 6.4 4.0 - 10.5 K/uL    RBC 3.50 (L) 4.22 - 5.81 MIL/uL    Hemoglobin 11.3 (L) 13.0 - 17.0 g/dL    HCT 47.8 (L) 29.5 - 52.0 %    MCV 96.9 80.0 - 100.0 fL    MCH 32.3 26.0 - 34.0 pg    MCHC 33.3 30.0 - 36.0 g/dL    RDW 62.1 30.8 - 65.7 %    Platelets 218 150 - 400 K/uL    nRBC 0.0 0.0 - 0.2 %      Comment: Performed at [facility], [address]  [place], [place] [address]  Renal function panel     Status: Abnormal    Collection Time: 02/21/23  5:33 AM  Result Value Ref Range    Sodium 134 (L) 135 - 145 mmol/L    Potassium 4.7 3.5 - 5.1 mmol/L    Chloride 93 (L) 98 - 111 mmol/L    CO2 25 22 - 32 mmol/L    Glucose, Bld 68 (L) 70 - 99 mg/dL      Comment: Glucose reference range applies only to samples taken after fasting for at least 8 hours.    BUN 34 (H) 8 - 23 mg/dL    Creatinine, Ser 29.52 (H) 0.61 - 1.24 mg/dL    Calcium 9.4 8.9 - 84.1 mg/dL    Phosphorus 5.3 (H) 2.5 - 4.6 mg/dL    Albumin 3.1 (L) 3.5 - 5.0 g/dL    GFR, Estimated 5 (L) >60 mL/min      Comment: (NOTE) Calculated using the CKD-EPI Creatinine Equation (2021)      Anion gap 16 (H) 5 - 15      Comment: Performed at [facility], [address] [place], [place] [address]       Imaging Results (Last 48 hours)  ECHOCARDIOGRAM COMPLETE BUBBLE STUDY   Result Date: 02/19/2023    ECHOCARDIOGRAM REPORT   Patient Name:   SAITH OWSIANY Date of Exam: 02/19/2023 Medical Rec #:  [id number]       Height:       72.0 in Accession #:    [id number]      Weight:       165.0 lb Date of Birth:  Jun 24, 1951       BSA:          1.963 m Patient Age:    71 years        BP:  181/88 mmHg Patient Gender: M               HR:           68 bpm. Exam Location:  Inpatient Procedure: 2D Echo, Cardiac Doppler, Color Doppler and Saline Contrast Bubble            Study Indications:    stroke  History:        Patient has prior history of Echocardiogram examinations, most                 recent 01/24/2022. End stage renal disease, Arrythmias:Atrial                 Fibrillation; Risk Factors:Hypertension.  Sonographer:    Delcie Roch RDCS Referring Phys: [id number] Marquita Palms VINCENT IMPRESSIONS  1. Left ventricular ejection fraction, by estimation, is 65 to 70%. The left ventricle has normal function. The left ventricle has no regional wall motion abnormalities. There is moderate left  ventricular hypertrophy. Left ventricular diastolic parameters are consistent with Grade II diastolic dysfunction (pseudonormalization). Elevated left atrial pressure.  2. Right ventricular systolic function is normal. The right ventricular size is normal. Tricuspid regurgitation signal is inadequate for assessing PA pressure.  3. Left atrial size was moderately dilated.  4. The mitral valve is normal in structure. Trivial mitral valve regurgitation. No evidence of mitral stenosis.  5. The aortic valve is tricuspid. Aortic valve regurgitation is not visualized. No aortic stenosis is present.  6. The inferior vena cava is normal in size with <50% respiratory variability, suggesting right atrial pressure of 8 mmHg.  7. Agitated saline contrast bubble study was negative, with no evidence of any interatrial shunt. FINDINGS  Left Ventricle: Left ventricular ejection fraction, by estimation, is 65 to 70%. The left ventricle has normal function. The left ventricle has no regional wall motion abnormalities. The left ventricular internal cavity size was normal in size. There is  moderate left ventricular hypertrophy. Left ventricular diastolic parameters are consistent with Grade II diastolic dysfunction (pseudonormalization). Elevated left atrial pressure. Right Ventricle: The right ventricular size is normal. No increase in right ventricular wall thickness. Right ventricular systolic function is normal. Tricuspid regurgitation signal is inadequate for assessing PA pressure. Left Atrium: Left atrial size was moderately dilated. Right Atrium: Right atrial size was normal in size. Pericardium: There is no evidence of pericardial effusion. Mitral Valve: The mitral valve is normal in structure. Trivial mitral valve regurgitation. No evidence of mitral valve stenosis. Tricuspid Valve: The tricuspid valve is normal in structure. Tricuspid valve regurgitation is trivial. Aortic Valve: The aortic valve is tricuspid. Aortic valve  regurgitation is not visualized. No aortic stenosis is present. Pulmonic Valve: The pulmonic valve was not well visualized. Pulmonic valve regurgitation is not visualized. Aorta: The aortic root is normal in size and structure. Venous: The inferior vena cava is normal in size with less than 50% respiratory variability, suggesting right atrial pressure of 8 mmHg. IAS/Shunts: The interatrial septum was not well visualized. Agitated saline contrast was given intravenously to evaluate for intracardiac shunting. Agitated saline contrast bubble study was negative, with no evidence of any interatrial shunt.  LEFT VENTRICLE PLAX 2D LVIDd:         4.40 cm   Diastology LVIDs:         2.60 cm   LV e' medial:    6.09 cm/s LV PW:         1.50 cm   LV E/e' medial:  18.1  LV IVS:        1.30 cm   LV e' lateral:   8.16 cm/s LVOT diam:     2.20 cm   LV E/e' lateral: 13.5 LV SV:         107 LV SV Index:   55 LVOT Area:     3.80 cm  RIGHT VENTRICLE             IVC RV Basal diam:  3.00 cm     IVC diam: 1.60 cm RV S prime:     15.10 cm/s TAPSE (M-mode): 2.1 cm LEFT ATRIUM              Index        RIGHT ATRIUM           Index LA diam:        4.50 cm  2.29 cm/m   RA Area:     20.80 cm LA Vol (A2C):   101.0 ml 51.44 ml/m  RA Volume:   58.70 ml  29.90 ml/m LA Vol (A4C):   73.5 ml  37.44 ml/m LA Biplane Vol: 86.3 ml  43.96 ml/m  AORTIC VALVE LVOT Vmax:   133.00 cm/s LVOT Vmean:  89.300 cm/s LVOT VTI:    0.282 m  AORTA Ao Root diam: 3.20 cm MITRAL VALVE MV Area (PHT): 4.68 cm     SHUNTS MV Decel Time: 162 msec     Systemic VTI:  0.28 m MV E velocity: 110.00 cm/s  Systemic Diam: 2.20 cm MV A velocity: 98.10 cm/s MV E/A ratio:  1.12 Epifanio Lesches MD Electronically signed by Epifanio Lesches MD Signature Date/Time: 02/19/2023/3:02:44 PM    Final            Blood pressure (!) 190/96, pulse 73, temperature 98.5 F (36.9 C), temperature source Oral, resp. rate 16, height 6' (1.829 m), weight 74.8 kg, SpO2 96 %.   Medical  Problem List and Plan: 1. 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  Antithrombotics: -DVT/anticoagulation:  Pharmaceutical: Eliquis             -antiplatelet therapy: N/a 3. Pain Management: tylenol prn.  4. Mood/Behavior/Sleep: LCSW to follow for evaluation and support.              --trazodone prn added for insomnia. Used tylenol PM occasionally at home.              -antipsychotic agents: N/A 5. Neuropsych/cognition: This patient is capable of making decisions on his  own behalf. 6. Skin/Wound Care: routine pressure relief measures.  7. Fluids/Electrolytes/Nutrition:  Strict I/O. 1200 cc FR/day 8. HTN: Monitor BP TID--permissive HTN for next 1-2 days.  --continue to hold diltiazem.   9. A fib: Monitor HR TID--on amiodarone and Eliquis.  10 ESRD: Schedule HD at the end of the day on TTS. 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  11. Seizures hx: Change Keppra to po with additional 250 mg on TTS after HD 12. Anemia of chronic disease: Stable. Serial CBC with HD.  13.  Dyslipidemia: HDL-29  [id number]. On Lipitor.      Jacquelynn Cree, PA-C 02/21/2023  I have personally performed  a face to face diagnostic evaluation of this patient and formulated the key components of the plan.  Additionally, I have personally reviewed laboratory data, imaging studies, as well as relevant notes and concur with the physician assistant's documentation above.  The patient's status has not changed from the original H&P.  Any changes in documentation from the acute care chart have been noted above.  Ranelle Oyster, MD, [place]

## 2023-02-21 NOTE — Discharge Summary (Signed)
Name: Tanner Rollins MRN: [id number] DOB: November 10, 1950 72 y.o. PCP: Pcp, No  Date of Admission: 02/18/2023  8:59 AM Date of Discharge:  02/21/2023 Attending Physician: Dr. Oswaldo Done  DISCHARGE DIAGNOSIS:  Primary Problem: Cerebral infarction [facility])   Hospital Problems: Principal Problem:   Cerebral infarction [facility]) Active Problems:   Severe hypertension   ESRD (end stage renal disease) on dialysis (HCC)   A-fib (HCC)   Seizure disorder (HCC)    DISCHARGE MEDICATIONS:   Allergies as of 02/21/2023   No Known Allergies      Medication List     TAKE these medications    acetaminophen 500 MG tablet Commonly known as: TYLENOL Take 1,000 mg by mouth daily as needed for moderate pain, headache or fever.   amiodarone 200 MG tablet Commonly known as: Pacerone Take 1 tablet (200 mg total) by mouth daily.   apixaban 5 MG Tabs tablet Commonly known as: ELIQUIS Take 1 tablet (5 mg total) by mouth 2 (two) times daily.   atorvastatin 40 MG tablet Commonly known as: LIPITOR Take 1 tablet (40 mg total) by mouth daily. Start taking on: February 22, 2023 What changed:  how much to take Another medication with the same name was removed. Continue taking this medication, and follow the directions you see here.   calcium acetate 667 MG capsule Commonly known as: PHOSLO Take 2 capsules (1,334 mg total) by mouth 3 (three) times daily with meals.   cloNIDine 0.1 MG tablet Commonly known as: CATAPRES Take 0.1 mg by mouth 2 (two) times daily.   diltiazem 180 MG 24 hr capsule Commonly known as: CARDIZEM CD Take 1 capsule (180 mg total) by mouth daily.   levETIRAcetam 500 MG tablet Commonly known as: KEPPRA Take 1 tablet (500 mg total) by mouth 2 (two) times daily. On dialysis days, please take AFTER dialysis What changed: additional instructions   multivitamin with minerals Tabs tablet Take 1 tablet by mouth daily.        DISPOSITION AND FOLLOW-UP:  [name] was  discharged from [facility] in Stable condition. At the hospital follow up visit please address:  R corona radiata cerebral infarction  2 infarcts noted, likely embolic in nature Ensure adherence with eliquis 5 mg bid and atorvastatin 40 mg daily Discharged to CIR to improve strength, as patient lives alone at home (does have good family support) Seizures On keppra 500 mg bid - recommend to take after dialysis on dialysis days (Tuesday, Thursday, Saturday)  Follow-up Recommendations: Consults: Neurology Labs:  None Studies: None New Medications: Atorvastatin 40 mg daily, Keppra 500 mg bid  Follow-up Appointments:  Follow-up Information     [facility].   Contact information: [address] [place] [address] [phone]         [name], MD. Call.   Specialties: Neurology, Radiology Contact information: [address] [place] [address] [phone]                 HOSPITAL COURSE:  Patient Summary: Acute cerebral infarctions within right corona radiata Two acute infarcts noted within R corona radiata, measuring up to 13 mm on MRI. Unclear if embolic or thrombotic in nature, although patient did report to neurology that he was not taking his eliquis daily, suspicious for embolic etiology. CT angio head/neck without large vessel occlusion, although there is atherosclerosis at the carotid bifurcation and involving the proximal ICA (without greater than 50% stenosis). LDL at goal,  68. A1c 5.3%. Echo demonstrated no concerns for stroke source. The patient worked with PT/OT throughout his hospitalization and was recommended for inpatient rehab - patient will discharge to CIR. He will continue eliquis 5 mg BID and atorvastatin 40 mg daily. Advised to follow up with PCP through the [facility] and Neurology once he is discharged to home.   Paroxysmal atrial fibrillation Currently being treated with  amiodarone 200 mg daily, diltiazem 180 mg daily, and eliquis. Amio and dilt were held on admission, in the setting of permissive HTN. Resume both medications at discharge.   Seizure Disorder First documented back in 2023 and the patient is prescribed Keppra 500 mg BID. He had some seizure like activity on arrival and was given ativan. EEG was negative for seizures/epileptiform activity. Discharged with Keppra 500 mg BID - advised to take after dialysis on TThS.   ESRD on HD TThS Anemia of chronic disease Received dialysis while admitted and will continue on TThS schedule.   Hypertension  Chronic with current home medications of Coreg, diltiazem, and clonidine. Held on admission in the setting of permissive hypertension, but resumed at discharge.    DISCHARGE INSTRUCTIONS:   Discharge Instructions     Call MD for:   Complete by: As directed    New, one sided weakness, facial droop, slurred speech   Call MD for:  difficulty breathing, headache or visual disturbances   Complete by: As directed    Call MD for:  persistant dizziness or light-headedness   Complete by: As directed    Diet - low sodium heart healthy   Complete by: As directed    Discharge instructions   Complete by: As directed    [name], Days were hospitalized because you had a stroke. You should take your eliquis 5 mg twice a day and atorvastatin 40 mg once a day to help prevent another stroke from happening.   Continue to take your other medications, as prescribed by the [place]. The atorvastatin was the only medicine that changed while you were in the hospital.   You will be discharged from the hospital and are going to the rehab unit, to help build up your strength again before you get home. Once you complete the physical therapy, we recommend you make an appointment to see your primary care physician through the [facility] for a hospital follow up.   It was a pleasure to take care of you, we are glad you are feeling  better!   Increase activity slowly   Complete by: As directed        SUBJECTIVE:  [name] was evaluated on the day of discharge. He didn't sleep well but notes that he was able to walk yesterday with PT. His sister brought him Cook-[employer] and his spirits are up.   Discharge Vitals:   BP (!) 186/104 (BP Location: Right Arm)   Pulse 73   Temp 98.7 F (37.1 C) (Oral)   Resp 18   Ht 6' (1.829 m)   Wt 74.8 kg   SpO2 98%   BMI 22.38 kg/m   OBJECTIVE:  General: Pleasant, well-appearing elderly male laying in bed. No acute distress. CV: RRR. No murmurs. Pulmonary: Normal work of breathing on room air Extremities: Normal bulk and tone Skin: Warm and dry.  Neuro: A&Ox3. Moves all extremities spontaneously, no facial droop noted.  Psych: Normal mood and affect    Pertinent Labs, Studies, and Procedures:     Latest Ref Rng & Units 02/21/2023  5:33 AM 02/20/2023    1:53 AM 02/19/2023    4:34 AM  CBC  WBC 4.0 - 10.5 K/uL 6.4  6.7  5.2   Hemoglobin 13.0 - 17.0 g/dL 81.1  91.4  78.2   Hematocrit 39.0 - 52.0 % 33.9  30.5  34.2   Platelets 150 - 400 K/uL 218  206  210        Latest Ref Rng & Units 02/21/2023    5:33 AM 02/20/2023    1:53 AM 02/19/2023    4:34 AM  CMP  Glucose 70 - 99 mg/dL 68  77  93   BUN 8 - 23 mg/dL 34  54  39   Creatinine 0.61 - 1.24 mg/dL 95.62  13.08  65.78   Sodium 135 - 145 mmol/L 134  135  135   Potassium 3.5 - 5.1 mmol/L 4.7  4.8  4.6   Chloride 98 - 111 mmol/L 93  93  93   CO2 22 - 32 mmol/L 25  22  25    Calcium 8.9 - 10.3 mg/dL 9.4  9.2  9.1     ECHOCARDIOGRAM COMPLETE BUBBLE STUDY  Result Date: 02/19/2023    ECHOCARDIOGRAM REPORT   Patient Name:   SCHON ETHEREDGE Date of Exam: 02/19/2023 Medical Rec #:  [id number]       Height:       72.0 in Accession #:    [id number]      Weight:       165.0 lb Date of Birth:  08/01/1951       BSA:          1.963 m Patient Age:    71 years        BP:           181/88 mmHg Patient Gender: M               HR:            68 bpm. Exam Location:  Inpatient Procedure: 2D Echo, Cardiac Doppler, Color Doppler and Saline Contrast Bubble            Study Indications:    stroke  History:        Patient has prior history of Echocardiogram examinations, most                 recent 01/24/2022. End stage renal disease, Arrythmias:Atrial                 Fibrillation; Risk Factors:Hypertension.  Sonographer:    Delcie Roch RDCS Referring Phys: [id number] Marquita Palms VINCENT IMPRESSIONS  1. Left ventricular ejection fraction, by estimation, is 65 to 70%. The left ventricle has normal function. The left ventricle has no regional wall motion abnormalities. There is moderate left ventricular hypertrophy. Left ventricular diastolic parameters are consistent with Grade II diastolic dysfunction (pseudonormalization). Elevated left atrial pressure.  2. Right ventricular systolic function is normal. The right ventricular size is normal. Tricuspid regurgitation signal is inadequate for assessing PA pressure.  3. Left atrial size was moderately dilated.  4. The mitral valve is normal in structure. Trivial mitral valve regurgitation. No evidence of mitral stenosis.  5. The aortic valve is tricuspid. Aortic valve regurgitation is not visualized. No aortic stenosis is present.  6. The inferior vena cava is normal in size with <50% respiratory variability, suggesting right atrial pressure of 8 mmHg.  7. Agitated saline contrast bubble study was negative, with no evidence of any interatrial shunt. FINDINGS  Left Ventricle: Left ventricular ejection fraction, by estimation, is 65 to 70%. The left ventricle has normal function. The left ventricle has no regional wall motion abnormalities. The left ventricular internal cavity size was normal in size. There is  moderate left ventricular hypertrophy. Left ventricular diastolic parameters are consistent with Grade II diastolic dysfunction (pseudonormalization). Elevated left atrial pressure. Right Ventricle: The  right ventricular size is normal. No increase in right ventricular wall thickness. Right ventricular systolic function is normal. Tricuspid regurgitation signal is inadequate for assessing PA pressure. Left Atrium: Left atrial size was moderately dilated. Right Atrium: Right atrial size was normal in size. Pericardium: There is no evidence of pericardial effusion. Mitral Valve: The mitral valve is normal in structure. Trivial mitral valve regurgitation. No evidence of mitral valve stenosis. Tricuspid Valve: The tricuspid valve is normal in structure. Tricuspid valve regurgitation is trivial. Aortic Valve: The aortic valve is tricuspid. Aortic valve regurgitation is not visualized. No aortic stenosis is present. Pulmonic Valve: The pulmonic valve was not well visualized. Pulmonic valve regurgitation is not visualized. Aorta: The aortic root is normal in size and structure. Venous: The inferior vena cava is normal in size with less than 50% respiratory variability, suggesting right atrial pressure of 8 mmHg. IAS/Shunts: The interatrial septum was not well visualized. Agitated saline contrast was given intravenously to evaluate for intracardiac shunting. Agitated saline contrast bubble study was negative, with no evidence of any interatrial shunt.  LEFT VENTRICLE PLAX 2D LVIDd:         4.40 cm   Diastology LVIDs:         2.60 cm   LV e' medial:    6.09 cm/s LV PW:         1.50 cm   LV E/e' medial:  18.1 LV IVS:        1.30 cm   LV e' lateral:   8.16 cm/s LVOT diam:     2.20 cm   LV E/e' lateral: 13.5 LV SV:         107 LV SV Index:   55 LVOT Area:     3.80 cm  RIGHT VENTRICLE             IVC RV Basal diam:  3.00 cm     IVC diam: 1.60 cm RV S prime:     15.10 cm/s TAPSE (M-mode): 2.1 cm LEFT ATRIUM              Index        RIGHT ATRIUM           Index LA diam:        4.50 cm  2.29 cm/m   RA Area:     20.80 cm LA Vol (A2C):   101.0 ml 51.44 ml/m  RA Volume:   58.70 ml  29.90 ml/m LA Vol (A4C):   73.5 ml  37.44  ml/m LA Biplane Vol: 86.3 ml  43.96 ml/m  AORTIC VALVE LVOT Vmax:   133.00 cm/s LVOT Vmean:  89.300 cm/s LVOT VTI:    0.282 m  AORTA Ao Root diam: 3.20 cm MITRAL VALVE MV Area (PHT): 4.68 cm     SHUNTS MV Decel Time: 162 msec     Systemic VTI:  0.28 m MV E velocity: 110.00 cm/s  Systemic Diam: 2.20 cm MV A velocity: 98.10 cm/s MV E/A ratio:  1.12 Epifanio Lesches MD Electronically signed by Epifanio Lesches MD Signature Date/Time: 02/19/2023/3:02:44 PM    Final  EEG adult  Result Date: 02/19/2023 Charlsie Quest, MD     02/19/2023 10:12 AM Patient Name: Zian Salzillo MRN: [id number] Epilepsy Attending: Charlsie Quest Referring Physician/Provider: Gwenevere Abbot, MD Date: 02/18/2023 Duration: 26.03 mins Patient history: 72 y.o. male with a past medical history of PAF on eliquis, seizure history- discharged 08/03/2022 on 500mg  keppra BID, HTN, ESRD on HD (fills medications through [place] in [place]) presenting with left side weakness, left facial droop, confusion. Level of alertness: Awake, asleep AEDs during EEG study: LEV Technical aspects: This EEG study was done with scalp electrodes positioned according to the 10-20 International system of electrode placement. Electrical activity was reviewed with band pass filter of 1-70Hz , sensitivity of 7 uV/mm, display speed of 72mm/sec with a 60Hz  notched filter applied as appropriate. EEG data were recorded continuously and digitally stored.  Video monitoring was available and reviewed as appropriate. Description: The posterior dominant rhythm consists of 8 Hz activity of moderate voltage (25-35 uV) seen predominantly in posterior head regions, symmetric and reactive to eye opening and eye closing. Sleep was characterized by vertex waves, sleep spindles (12 to 14 Hz), maximal frontocentral region. EEG showed continuous 3 to 6 Hz theta-delta slowing in right hemisphere. Physiologic photic driving was not seen during photic stimulation.  Hyperventilation was not  performed.   ABNORMALITY - Continuous slow, right hemisphere IMPRESSION: This study is suggestive of cortical dysfunction arising from right hemisphere likely secondary to underlying stroke. No seizures or epileptiform discharges were seen throughout the recording. Charlsie Quest   MR BRAIN WO CONTRAST  Result Date: 02/18/2023 CLINICAL DATA:  Provided history: Seizure, new onset, no history of trauma. EXAM: MRI HEAD WITHOUT CONTRAST TECHNIQUE: Multiplanar, multiecho pulse sequences of the brain and surrounding structures were obtained without intravenous contrast. COMPARISON:  Noncontrast head CT and CT angiogram head/neck 02/18/2023. Brain MRI 07/31/2022 FINDINGS: Brain: Mild generalized parenchymal atrophy. Two acute infarcts within the right corona radiata measuring up to 13 mm. Chronic cortical/subcortical infarct (with associated chronic hemosiderin deposition) in the right parietal lobe. Chronic lacunar infarcts within the bilateral cerebral hemispheric white matter, deep gray nuclei and within the pons. Chronic hemosiderin deposition associated with a chronic lacunar infarct in the right basal ganglia. Background moderate multifocal T2 FLAIR hyperintense signal abnormality within the cerebral white matter, nonspecific but compatible with chronic small vessel ischemic disease. Multiple small chronic infarcts within the left cerebellar hemisphere. Chronic microhemorrhages scattered within the bilateral cerebral hemispheres, thalami, brainstem and cerebellum. No evidence of an intracranial mass No extra-axial fluid collection. No midline shift. Vascular: Maintained flow voids within the proximal large arterial vessels. Skull and upper cervical spine: No suspicious marrow lesion. Incompletely assessed cervical spondylosis. Sinuses/Orbits: No mass or acute finding within the imaged orbits. Mild mucosal thickening versus small mucous retention cyst within the inferior left maxillary sinus. Tiny mucous  retention cysts within the bilateral sphenoid sinuses. IMPRESSION: 1. Two acute infarcts within the right corona radiata measuring up to 13 mm. 2. Background parenchymal atrophy, chronic small vessel ischemic disease and chronic infarcts, as detailed and stable from the prior brain MRI of 07/31/2022. 3. As before, there are scattered chronic microhemorrhages within the supratentorial and infratentorial brain. Findings likely at least partly reflect sequelae of chronic hypertensive microangiopathy. However, a superimposed component of cerebral amyloid angiopathy cannot be excluded. Electronically Signed   By: Jackey Loge D.O.   On: 02/18/2023 11:56   CT ANGIO HEAD NECK W WO CM (CODE STROKE)  Result Date: 02/18/2023 CLINICAL DATA:  Assess intracranial arteries. EXAM: CT ANGIOGRAPHY HEAD AND NECK WITH AND WITHOUT CONTRAST TECHNIQUE: Multidetector CT imaging of the head and neck was performed using the standard protocol during bolus administration of intravenous contrast. Multiplanar CT image reconstructions and MIPs were obtained to evaluate the vascular anatomy. Carotid stenosis measurements (when applicable) are obtained utilizing NASCET criteria, using the distal internal carotid diameter as the denominator. RADIATION DOSE REDUCTION: This exam was performed according to the departmental dose-optimization program which includes automated exposure control, adjustment of the mA and/or kV according to patient size and/or use of iterative reconstruction technique. CONTRAST:  75mL OMNIPAQUE IOHEXOL 350 MG/ML SOLN COMPARISON:  Same day CT head.  CTA head/neck 07/30/2022. FINDINGS: CTA NECK FINDINGS Aortic arch: Great vessel origins are patent without significant stenosis. Aortic atherosclerosis. Right carotid system: Atherosclerosis at the carotid bifurcation and involving the proximal ICA without greater than 50% stenosis. Left carotid system: Atherosclerosis at the carotid bifurcation without greater than 50%  stenosis. Vertebral arteries: Potentially significant stenosis of the right vertebral artery origins; however, evaluation is significantly limited due to streak artifact. Moderate stenosis of the proximal left V2 vertebral artery. Skeleton: No evidence of acute abnormality on limited assessment. Other neck: No acute abnormality on limited assessment. Upper chest: Visualized lung apices are clear. Review of the MIP images confirms the above findings CTA HEAD FINDINGS Anterior circulation: Bilateral intracranial ICAs, MCAs and ACAs are patent Posterior circulation: Severe stenosis of the intradural right vertebral artery. This vertebral artery is non dominant/small and terminates as PICA, anatomic variant. The left intradural vertebral artery, basilar artery and bilateral posterior cerebral arteries are patent. Left fetal type PCA. Venous sinuses: As permitted by contrast timing, patent. Anatomic variants: Detailed above. Review of the MIP images confirms the above findings IMPRESSION: 1. No emergent large vessel occlusion. 2. 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 3. Moderate proximal left V2 vertebral artery stenosis. 4. Potentially significant stenosis of the right vertebral artery origins; however, evaluation is significantly limited due to streak artifact. 5. Aortic Atherosclerosis (ICD10-I70.0) and Emphysema (ICD10-J43.9). Electronically Signed   By: Feliberto Harts M.D.   On: 02/18/2023 09:40   CT HEAD CODE STROKE WO CONTRAST  Result Date: 02/18/2023 CLINICAL DATA:  Code stroke.  Neuro deficit, acute, stroke suspected EXAM: CT HEAD WITHOUT CONTRAST TECHNIQUE: Contiguous axial images were obtained from the base of the skull through the vertex without intravenous contrast. RADIATION DOSE REDUCTION: This exam was performed according to the departmental dose-optimization program which includes automated exposure control, adjustment of the mA and/or kV according to patient  size and/or use of iterative reconstruction technique. COMPARISON:  CT head 07/30/2022. FINDINGS: Brain: Similar appearance of remote infarcts in the right parietal lobe, basal ganglia and right frontal white matter. No evidence of acute large vascular territory infarct, acute hemorrhage, mass lesion, hydrocephalus, or midline shift. Vascular: No hyperdense vessel identified. Skull: No acute fracture. Sinuses/Orbits: Mostly clear sinuses. Left maxillary sinus retention cyst. Other: No mastoid effusions. ASPECTS [facility] Stroke Program Early CT Score) total score (0-10 with 10 being normal): 10. IMPRESSION: 1. No evidence of acute intracranial abnormality. ASPECTS is 10. 2. Multiple remote infarcts. An MRI could provide more sensitive evaluation for acute on chronic peri-infarct ischemia if clinically warranted. Code stroke imaging results were communicated on 02/18/2023 at 9:19 am to provider Dr. Selina Cooley Via secure text paging. Electronically Signed   By: Feliberto Harts M.D.   On: 02/18/2023 09:20     Signed: Elza Rafter, D.O.  Internal Medicine Resident, PGY-2 [facility]  Pager: [phone] 10:26 AM, 02/21/2023

## 2023-02-21 NOTE — Progress Notes (Signed)
Inpatient Rehabilitation Admission Medication Review by a Pharmacist  A complete drug regimen review was completed for this patient to identify any potential clinically significant medication issues.  High Risk Drug Classes Is patient taking? Indication by Medication  Antipsychotic No   Anticoagulant Yes Apixaban - afib  Antibiotic No   Opioid No   Antiplatelet No   Hypoglycemics/insulin No   Vasoactive Medication Yes Hydralazine - prn elevated BP  Chemotherapy No   Other Yes Amiodaron - afib Atorvastatin - HLD Levetiracetam - seizures Sevelamer - hyperphos     Type of Medication Issue Identified Description of Issue Recommendation(s)  Drug Interaction(s) (clinically significant)     Duplicate Therapy     Allergy     No Medication Administration End Date     Incorrect Dose  Keppra dosing for ESRD is 500-1000mg  daily followed by a 250mg   supplemental dose after HD. Change order to 500mg  or 1000mg  once daily.  Currently ordered 500mg  BID.  Continue 250mg  supplemental dose as ordered.  Additional Drug Therapy Needed     Significant med changes from prior encounter (inform family/care partners about these prior to discharge).    Other  Alum Hydroxide is not recommended in ESRD patients due to aluminum content and risk of accumulation     Clinically significant medication issues were identified that warrant physician communication and completion of prescribed/recommended actions by midnight of the next day:  Yes - Rounding pharmacist to follow-up with rehab team 7/6.  Name of provider notified for urgent issues identified:   Provider Method of Notification:     Pharmacist comments:   Time spent performing this drug regimen review (minutes):  20   Tanner Rollins, Judie Bonus 02/21/2023 8:08 PM

## 2023-02-21 NOTE — Progress Notes (Signed)
PMR Admission Coordinator Pre-Admission Assessment   Patient: Tanner Rollins is an 72 y.o., male MRN: [id number] DOB: Sep 15, 1950 Height: 6' (182.9 cm) Weight: 74.8 kg   Insurance Information HMO:     PPO:      PCP:      IPA:      80/20:      OTHER:  PRIMARY: [facility]       Policy#:  [id number]         Subscriber: Pt CM Name: Cassie       Phone#:  [phone]      Fax#: [phone] Pt. [facility] by [facility] for admit on 02/19/23 for 30 days (updated to approval from 02/21/23- 8/4) Pre-Cert#: [id number]   Employer:  Benefits:  Phone #:      Name:   Dolores Hoose Date: 01/10/2018 - still active      Deduct:       Out of Pocket Max:       Life Max:  CIR:  100%     SNF:  Outpatient:      Co-Pay:  Home Health:       Co-Pay:  DME:      Co-Pay:  Providers: in network   SECONDARY: Medicare Part A       Policy#: [id number]      Phone#:    Artist:       Phone#:    The Data processing manager" for patients in Inpatient Rehabilitation Facilities with attached "Privacy Act Statement-Health Care Records" was provided and verbally reviewed with: Patient   Emergency Contact Information Contact Information       Name Relation Home Work Mobile    [place] Sister     [phone]           Current Medical History  Patient Admitting Diagnosis: CVA History of Present Illness: Tanner Rollins is a 72 y.o. with a pertinent PMH of ESRD on HD TThS, afib, and seizure disorder, who presented with limb weakness and and presented to the [facility] ED 02/18/23 due to weakness  and syncopal episode and admitted with for acute infarctions in right corona radiata and seizure activity.  In the ED, neurology was consulted for stroke evaluation. Pt was given 2 mg IV ativan and loaded with Keppra after the seizure episode. MRI showed Two acute infarcts noted within R corona radiata, measuring up to 13 mm on MRI. Unclear if embolic or thrombotic in nature, although patient did report to  neurology that he was not taking his eliquis daily. CT angio head/neck without large vessel occlusion, although there is atherosclerosis at the carotid bifurcation and involving the proximal ICA (without greater than 50% stenosis). LDL at goal, 68. A1c 5.3%. Pt. Seen by PT/OT/SLP and they recommend CIR to assist return to PLOF.    Complete NIHSS TOTAL: 2   Patient's medical record from [facility]  has been reviewed by the rehabilitation admission coordinator and physician.   Past Medical History      Past Medical History:  Diagnosis Date   ESRD on hemodialysis (HCC)      [facility]   Hypertension     PAF (paroxysmal atrial fibrillation) (HCC)        Has the patient had major surgery during 100 days prior to admission? No   Family History   family history is not on file.   Current Medications   Current Facility-Administered Medications:    (feeding supplement) PROSource Plus liquid 30 mL, 30 mL,  Oral, BID BM, Julien Nordmann, PA-C, 30 mL at 02/19/23 1234   acetaminophen (TYLENOL) tablet 650 mg, 650 mg, Oral, Q6H PRN, 650 mg at 02/19/23 0549 **OR** acetaminophen (TYLENOL) suppository 650 mg, 650 mg, Rectal, Q6H PRN, Gwenevere Abbot, MD   amiodarone (PACERONE) tablet 200 mg, 200 mg, Oral, Daily, Atway, Rayann N, DO, 200 mg at 02/19/23 1234   apixaban (ELIQUIS) tablet 5 mg, 5 mg, Oral, BID, Gwenevere Abbot, MD, 5 mg at 02/19/23 0930   atorvastatin (LIPITOR) tablet 40 mg, 40 mg, Oral, Daily, Gwenevere Abbot, MD, 40 mg at 02/19/23 0930   Chlorhexidine Gluconate Cloth 2 % PADS 6 each, 6 each, Topical, Daily, Tyson Alias, MD   [START ON 02/20/2023] levETIRAcetam (KEPPRA) 250 mg in sodium chloride 0.9 % 100 mL IVPB, 250 mg, Intravenous, Once per day on Tue Thu Sat, Khan, Carylon Perches, MD   levETIRAcetam (KEPPRA) IVPB 500 mg/100 mL premix, 500 mg, Intravenous, Q12H, Gwenevere Abbot, MD, Last Rate: 400 mL/hr at 02/19/23 0838, 500 mg at 02/19/23 4098   senna-docusate (Senokot-S)  tablet 1 tablet, 1 tablet, Oral, QHS PRN, Gwenevere Abbot, MD   sevelamer carbonate (RENVELA) tablet 1,600 mg, 1,600 mg, Oral, TID WC, Julien Nordmann, PA-C, 1,600 mg at 02/19/23 1234   sodium chloride flush (NS) 0.9 % injection 3 mL, 3 mL, Intravenous, Once, Gerhard Munch, MD   Patients Current Diet:  Diet Order                  Diet renal with fluid restriction Fluid restriction: 1200 mL Fluid; Room service appropriate? Yes; Fluid consistency: Thin  Diet effective now                         Precautions / Restrictions Precautions Precautions: Fall Restrictions Weight Bearing Restrictions: No    Has the patient had 2 or more falls or a fall with injury in the past year? Yes   Prior Activity Level Community (5-7x/wk): Pt. active in the community PTA   Prior Functional Level Self Care: Did the patient need help bathing, dressing, using the toilet or eating? Independent   Indoor Mobility: Did the patient need assistance with walking from room to room (with or without device)? Independent   Stairs: Did the patient need assistance with internal or external stairs (with or without device)? Independent   Functional Cognition: Did the patient need help planning regular tasks such as shopping or remembering to take medications? Independent   Patient Information Are you of Hispanic, Latino/a,or Spanish origin?: A. No, not of Hispanic, Latino/a, or Spanish origin What is your race?: B. Black or African American Do you need or want an interpreter to communicate with a doctor or health care staff?: 2. No   Patient's Response To:  Health Literacy and Transportation Is the patient able to respond to health literacy and transportation needs?: Yes Health Literacy - How often do you need to have someone help you when you read instructions, pamphlets, or other written material from your doctor or pharmacy?: Never In the past 12 months, has lack of transportation kept you from medical  appointments or from getting medications?: No In the past 12 months, has lack of transportation kept you from meetings, work, or from getting things needed for daily living?: No   Journalist, newspaper / Equipment Home Assistive Devices/Equipment: Grab bars in shower, Environmental consultant (specify type) Home Equipment: Agricultural consultant (2 wheels), [employer] - single point, Wheelchair - manual, Grab bars -  tub/shower, Hand held shower head (has an emergency pull cord in bathroom and bedroom)   Prior Device Use: Indicate devices/aids used by the patient prior to current illness, exacerbation or injury? Manual wheelchair and Walker   Current Functional Level Cognition   Overall Cognitive Status: Impaired/Different from baseline Current Attention Level: Selective Orientation Level: Oriented X4 Safety/Judgement: Decreased awareness of safety, Decreased awareness of deficits General Comments: more difficulty with higher level cogniive tasks in dynamic activites and as pt fatigued increasing his risk of falls    Extremity Assessment (includes Sensation/Coordination)   Upper Extremity Assessment: LUE deficits/detail LUE Deficits / Details: AROM WFL; strength generally 4/5 throughout; aparent coordination deficits; unaware at times that L hand was not on RW LUE Sensation: decreased proprioception LUE Coordination: decreased fine motor  Lower Extremity Assessment: Defer to PT evaluation RLE Deficits / Details: 4+/5 thorughout hip flex, knee ext/flex, DF; AROM WFL. RLE Sensation: WNL RLE Coordination: WNL LLE Deficits / Details: 4+/5 throughout hip flex, knee ext/flex, DF; AROM WFL. some coordination deficits noted with LT LE placement during gait. LLE Sensation: decreased proprioception LLE Coordination: decreased gross motor     ADLs   Overall ADL's : Needs assistance/impaired Eating/Feeding: Set up Grooming: Minimal assistance, Standing Upper Body Bathing: Set up, Sitting Lower Body Bathing: Minimal  assistance, Sit to/from stand Upper Body Dressing : Set up, Supervision/safety, Sitting Lower Body Dressing: Minimal assistance, Sit to/from stand Lower Body Dressing Details (indicate cue type and reason): unaware brief was not pulled up on L Toilet Transfer: Minimal assistance, Regular Toilet, Rolling walker (2 wheels), Grab bars Toileting- Clothing Manipulation and Hygiene: Minimal assistance Functional mobility during ADLs: Minimal assistance, Cueing for safety, Cueing for sequencing, Rolling walker (2 wheels)     Mobility   Overal bed mobility: Needs Assistance Bed Mobility: Supine to Sit Supine to sit: Supervision General bed mobility comments: sup for safety, HOB elevated     Transfers   Overall transfer level: Needs assistance Equipment used: Rolling walker (2 wheels) Transfers: Sit to/from Stand Sit to Stand: Min assist     Ambulation / Gait / Stairs / Psychologist, prison and probation services   Ambulation/Gait Ambulation/Gait assistance: Editor, commissioning (Feet): 110 Feet Assistive device: Rolling walker (2 wheels) Gait Pattern/deviations: Step-through pattern, Decreased step length - left, Decreased stance time - left, Knee flexed in stance - left, Narrow base of support Gait velocity: decr     Posture / Balance Balance Overall balance assessment: Needs assistance Sitting-balance support: Feet supported Sitting balance-Leahy Scale: Fair Standing balance support: During functional activity, Reliant on assistive device for balance, Bilateral upper extremity supported Standing balance-Leahy Scale: Poor     Special needs/care consideration Dialysis: Hemodialysis Tuesday, Thursday, and Saturday    Previous Home Environment (from acute therapy documentation) Living Arrangements: Alone Available Help at Discharge: Family, Available 24 hours/day (sister checks on him 3x/day) Type of Home: Apartment Home Layout: One level Home Access: Engineer, maintenance (IT) Shower/Tub: Walk-in shower, Sponge  bathes at baseline [employer]: Standard Bathroom Accessibility: Yes How Accessible: Accessible via walker Home Care Services: Yes   Discharge Living Setting Plans for Discharge Living Setting: Patient's home Type of Home at Discharge: House Discharge Home Layout: One level Discharge Home Access: Elevator Discharge Bathroom Shower/Tub: Walk-in shower Discharge Bathroom Toilet: Standard Discharge Bathroom Accessibility: Yes How Accessible: Accessible via walker Does the patient have any problems obtaining your medications?: No   Social/Family/Support Systems Patient Roles: Other (Comment) Contact Information: [phone] Anticipated Caregiver: Siva Livaudais (sister) can check on him intermittently.  Pt.'s girlfriend Harriett Sine is planning to move in with him Ability/Limitations of Caregiver: Min A Caregiver Availability: 24/7 Discharge Plan Discussed with Primary Caregiver: Yes Is Caregiver In Agreement with Plan?: Yes Does Caregiver/Family have Issues with Lodging/Transportation while Pt is in Rehab?: No   Goals Patient/Family Goal for Rehab: PT/OT/SLP Supervision Expected length of stay: 7-10 days Pt/Family Agrees to Admission and willing to participate: Yes Program Orientation Provided & Reviewed with Pt/Caregiver Including Roles  & Responsibilities: Yes   Decrease burden of Care through IP rehab admission: not anticipated   Possible need for SNF placement upon discharge: not anticipated   Patient Condition: I have reviewed medical records from [facility] , spoken with CM, and patient. I met with patient at the bedside for inpatient rehabilitation assessment.  Patient will benefit from ongoing PT, OT, and SLP, can actively participate in 3 hours of therapy a day 5 days of the week, and can make measurable gains during the admission.  Patient will also benefit from the coordinated team approach during an Inpatient Acute Rehabilitation admission.  The patient  will receive intensive therapy as well as Rehabilitation physician, nursing, social worker, and care management interventions.  Due to safety, skin/wound care, disease management, medication administration, pain management, and patient education the patient requires 24 hour a day rehabilitation nursing.  The patient is currently min A  with mobility and basic ADLs.  Discharge setting and therapy post discharge at home with home health is anticipated.  Patient has agreed to participate in the Acute Inpatient Rehabilitation Program and will admit today.   Preadmission Screen Completed By:  Jeronimo Greaves, 02/19/2023 2:02 PM ______________________________________________________________________   Discussed status with Dr. Riley Kill on 02/21/23    at 930 and received approval for admission today.   Admission Coordinator:  Jeronimo Greaves, CCC-SLP, time 1017/Date 02/21/23    Assessment/Plan: Diagnosis: CVA Does the need for close, 24 hr/day Medical supervision in concert with the patient's rehab needs make it unreasonable for this patient to be served in a less intensive setting? Yes Co-Morbidities requiring supervision/potential complications: ESRD on HD, afib, sz disorder Due to bladder management, bowel management, safety, skin/wound care, disease management, medication administration, pain management, and patient education, does the patient require 24 hr/day rehab nursing? Yes Does the patient require coordinated care of a physician, rehab nurse, PT, OT, and SLP to address physical and functional deficits in the context of the above medical diagnosis(es)? Yes Addressing deficits in the following areas: balance, endurance, locomotion, strength, transferring, bowel/bladder control, bathing, dressing, feeding, grooming, toileting, cognition, and speech Can the patient actively participate in an intensive therapy program of at least 3 hrs of therapy 5 days a week? Yes The potential for patient to make measurable  gains while on inpatient rehab is excellent Anticipated functional outcomes upon discharge from inpatient rehab: supervision PT, supervision OT, supervision SLP Estimated rehab length of stay to reach the above functional goals is: 7-10 days Anticipated discharge destination: Home 10. Overall Rehab/Functional Prognosis: excellent     MD Signature: Ranelle Oyster, MD, [facility] Physical Medicine & Rehabilitation Medical Director Rehabilitation Services 02/21/2023

## 2023-02-21 NOTE — Progress Notes (Signed)
Inpatient Rehab Admissions Coordinator:  ?  ?Pt. To admit to CIR today. RN may call report to [phone] ? ?Alandria Butkiewicz, MS, CCC-SLP ?Rehab Admissions Coordinator  ?[phone] (celll) ?[phone] (office) ? ?

## 2023-02-21 NOTE — TOC Transition Note (Signed)
Transition of Care [facility]) - CM/SW Discharge Note   Patient Details  Name: Bastien Coody MRN: [id number] Date of Birth: Sep 08, 1950  Transition of Care [facility]) CM/SW Contact:  Kermit Balo, RN Phone Number: 02/21/2023, 10:19 AM   Clinical Narrative:    Pt is discharging to CIR today. CM signing off.    Final next level of care: IP Rehab Facility Barriers to Discharge: No Barriers Identified   Patient Goals and CMS Choice CMS Medicare.gov Compare Post Acute Care list provided to:: Patient Choice offered to / list presented to : Patient  Discharge Placement                         Discharge Plan and Services Additional resources added to the After Visit Summary for     Discharge Planning Services: CM Consult Post Acute Care Choice: IP Rehab                               Social Determinants of Health (SDOH) Interventions SDOH Screenings   Food Insecurity: Patient Unable To Answer (02/18/2023)  Housing: Patient Unable To Answer (02/18/2023)  Transportation Needs: Patient Unable To Answer (02/18/2023)  Utilities: Patient Unable To Answer (02/18/2023)  Tobacco Use: Medium Risk (02/18/2023)     Readmission Risk Interventions     No data to display

## 2023-02-21 NOTE — Progress Notes (Addendum)
Physical Therapy Treatment Patient Details Name: Tanner Rollins MRN: [id number] DOB: 1951-04-18 Today's Date: 02/21/2023   History of Present Illness Patient is 72 y.o. male who presented to the ED via EMS after weakness and a syncopal episode and had a seizure en-route to the ED and repeat seizure activity in the ED. MRI work-up revealed two acute infarcts within the right corona radiata as well as background parenchymal atrophy, chronic small vessel ischemic disease and chronic infarcts. PMH significant for ESRD TTS, HTN, Afib with RVR, prior respiratory failure, metabolic bone disease.    PT Comments  Patient resting in bed and agreeable to therapy. Pt required min guard/assist for transfers and min assist for gait with dual cognitive task today. Pt naming foods starting with each letter of the alphabet. pt unable to name a food for "D" and unable to name a food for "G" getting stuck there. Pt abel to recall foods for ABC at end of gait but could not recall food for D without cuing. as pt focused on cog task steps become shortened and more flexed but no buckling noted. EOS pt completed standing UE exercise and seated LE exercise with resistance. He was agreeable to remain OOB in recliner. Alarm on and call bell within reach. Will continue to progress as able.    Assistance Recommended at Discharge Intermittent Supervision/Assistance  If plan is discharge home, recommend the following:  Can travel by private vehicle    A little help with walking and/or transfers;A little help with bathing/dressing/bathroom;Assistance with cooking/housework;Direct supervision/assist for medications management;Assist for transportation;Help with stairs or ramp for entrance      Equipment Recommendations  None recommended by PT    Recommendations for Other Services Rehab consult     Precautions / Restrictions Precautions Precautions: Fall Restrictions Weight Bearing Restrictions: No     Mobility  Bed  Mobility Overal bed mobility: Needs Assistance Bed Mobility: Supine to Sit     Supine to sit: Supervision     General bed mobility comments: sup for safety, HOB elevated.    Transfers Overall transfer level: Needs assistance Equipment used: Rolling walker (2 wheels) Transfers: Sit to/from Stand Sit to Stand: Min assist, Min guard           General transfer comment: cues for hand placement and assist to power up from low bed height, fading to min guard from elevated height.    Ambulation/Gait Ambulation/Gait assistance: Min assist Gait Distance (Feet): 225 Feet Assistive device: Rolling walker (2 wheels) Gait Pattern/deviations: Step-through pattern, Decreased step length - left, Decreased stance time - left, Knee flexed in stance - left, Narrow base of support Gait velocity: decr     General Gait Details: pt required min assist and intermittent cues to maintain close proximity to RW. dual task  cognitive challenge performed during gait. Pt naming foods starting with each letter of the alphabet. pt unable to name a food for "D" and unable to name a food for "G" getting stuck there. Pt abel to recall foods for ABC at end of gait but could not recall food for D without cuing. as pt focused on cog task steps become shortened and more flexed. no buckling.   Stairs             Wheelchair Mobility     Tilt Bed    Modified Rankin (Stroke Patients Only)       Balance Overall balance assessment: Needs assistance Sitting-balance support: Feet supported Sitting balance-Leahy Scale: Fair  Standing balance support: During functional activity, Reliant on assistive device for balance, Bilateral upper extremity supported Standing balance-Leahy Scale: Poor                              Cognition Arousal/Alertness: Awake/alert Behavior During Therapy: WFL for tasks assessed/performed Overall Cognitive Status: Impaired/Different from baseline Area of  Impairment: Attention, Safety/judgement, Awareness, Problem solving, Memory                   Current Attention Level: Selective Memory: Decreased short-term memory   Safety/Judgement: Decreased awareness of deficits, Decreased awareness of safety Awareness: Emergent Problem Solving: Slow processing, Difficulty sequencing General Comments: pt challenged with dual task cognitive task with gait. struggled to get through alphabet (A-G).        Exercises Other Exercises Other Exercises: red theraband bil UE bicep curlw/ shoulder press in standing. 10 reps. pt with greater difficulty on Lt UE. Other Exercises: bil LE LAQ with red theraband for resistance. 10x bil    General Comments        Pertinent Vitals/Pain Pain Assessment Pain Assessment: No/denies pain    Home Living                          Prior Function            PT Goals (current goals can now be found in the care plan section) Acute Rehab PT Goals Patient Stated Goal: regain independence PT Goal Formulation: With patient Time For Goal Achievement: 03/05/23 Potential to Achieve Goals: Good Progress towards PT goals: Progressing toward goals    Frequency    Min 4X/week      PT Plan Current plan remains appropriate    Co-evaluation   Reason for Co-Treatment: For patient/therapist safety          AM-PAC PT "6 Clicks" Mobility   Outcome Measure  Help needed turning from your back to your side while in a flat bed without using bedrails?: A Little Help needed moving from lying on your back to sitting on the side of a flat bed without using bedrails?: A Little Help needed moving to and from a bed to a chair (including a wheelchair)?: A Little Help needed standing up from a chair using your arms (e.g., wheelchair or bedside chair)?: A Little Help needed to walk in hospital room?: A Little Help needed climbing 3-5 steps with a railing? : A Lot 6 Click Score: 17    End of Session  Equipment Utilized During Treatment: Gait belt Activity Tolerance: Patient tolerated treatment well Patient left: in bed;with call bell/phone within reach (unable to set bed alarm, NT notified) Nurse Communication: Mobility status PT Visit Diagnosis: Unsteadiness on feet (R26.81);Other abnormalities of gait and mobility (R26.89);Muscle weakness (generalized) (M62.81);Difficulty in walking, not elsewhere classified (R26.2)     Time: 7846-9629 PT Time Calculation (min) (ACUTE ONLY): 29 min  Charges:    $Gait Training: 8-22 mins $Therapeutic Exercise: 8-22 mins PT General Charges $$ ACUTE PT VISIT: 1 Visit                     Wynn Maudlin, DPT Acute Rehabilitation Services Office [phone]  02/21/23 11:58 AM

## 2023-02-21 NOTE — Plan of Care (Signed)
  Problem: Education: Goal: Knowledge of General Education information will improve Description: Including pain rating scale, medication(s)/side effects and non-pharmacologic comfort measures Outcome: Adequate for Discharge   Problem: Health Behavior/Discharge Planning: Goal: Ability to manage health-related needs will improve Outcome: Adequate for Discharge   Problem: Clinical Measurements: Goal: Ability to maintain clinical measurements within normal limits will improve Outcome: Adequate for Discharge Goal: Will remain free from infection Outcome: Adequate for Discharge Goal: Diagnostic test results will improve Outcome: Adequate for Discharge Goal: Respiratory complications will improve Outcome: Adequate for Discharge Goal: Cardiovascular complication will be avoided Outcome: Adequate for Discharge   Problem: Activity: Goal: Risk for activity intolerance will decrease Outcome: Adequate for Discharge   Problem: Nutrition: Goal: Adequate nutrition will be maintained Outcome: Adequate for Discharge   Problem: Coping: Goal: Level of anxiety will decrease Outcome: Adequate for Discharge   Problem: Elimination: Goal: Will not experience complications related to bowel motility Outcome: Adequate for Discharge Goal: Will not experience complications related to urinary retention Outcome: Adequate for Discharge   Problem: Pain Managment: Goal: General experience of comfort will improve Outcome: Adequate for Discharge   Problem: Safety: Goal: Ability to remain free from injury will improve Outcome: Adequate for Discharge   Problem: Skin Integrity: Goal: Risk for impaired skin integrity will decrease Outcome: Adequate for Discharge   Problem: Education: Goal: Knowledge of disease or condition will improve Outcome: Adequate for Discharge Goal: Knowledge of secondary prevention will improve (MUST DOCUMENT ALL) Outcome: Adequate for Discharge Goal: Knowledge of patient  specific risk factors will improve (Mark N/A or DELETE if not current risk factor) Outcome: Adequate for Discharge   Problem: Ischemic Stroke/TIA Tissue Perfusion: Goal: Complications of ischemic stroke/TIA will be minimized Outcome: Adequate for Discharge   Problem: Coping: Goal: Will verbalize positive feelings about self Outcome: Adequate for Discharge Goal: Will identify appropriate support needs Outcome: Adequate for Discharge   Problem: Health Behavior/Discharge Planning: Goal: Ability to manage health-related needs will improve Outcome: Adequate for Discharge Goal: Goals will be collaboratively established with patient/family Outcome: Adequate for Discharge   Problem: Self-Care: Goal: Ability to participate in self-care as condition permits will improve Outcome: Adequate for Discharge Goal: Verbalization of feelings and concerns over difficulty with self-care will improve Outcome: Adequate for Discharge Goal: Ability to communicate needs accurately will improve Outcome: Adequate for Discharge   Problem: Nutrition: Goal: Risk of aspiration will decrease Outcome: Adequate for Discharge Goal: Dietary intake will improve Outcome: Adequate for Discharge   

## 2023-02-21 NOTE — Progress Notes (Signed)
[facility] Progress Note    Dialysis Orders: TTS at [facility] 4hr, 400/800, EDW 70kg, 2K/2Ca, AVF, 15g needles, heparin 3000 bolus - no recent ESA, Hgb typically > 11 - no VDRA at the moment, prev on Hectoral - held as of 6/29 for Ca 10.2   Assessment/Plan:  Acute R CVA (2 areas): Neuro following, started on Keppra.On Eliquis + HL. Appears to be back to baseline now. Being considered for CIR. ESRD:  Usual TTS schedule - missed last HD, tolerated HD on Thur with 1.5L UF. No heparin, low UFG, next HD tomorrow.  Hypertension/volume: BP high - permissive HTN. No edema.  Anemia: Hgb > 10.5, no ESA for now  Metabolic bone disease: Ca ok, Phos high - resume sevelamer as binder.  Nutrition:  Alb low, continue supps.  pA-Fib: On Eliquis + amiodarone.  Subjective:   Seen in room, did well on HD Thur w/ no cramping or neurological issues. Trying to avoid relative hypotension. Goal to keep SBP > 105 at all times. Denies CP/dyspnea.  Objective Vitals:   02/20/23 1952 02/20/23 2346 02/21/23 0412 02/21/23 0810  BP: (!) 186/92 (!) 180/92 (!) 168/90 (!) 186/104  Pulse: 73 74 75 73  Resp: 16 16 17 18   Temp: 99 F (37.2 C) 98.7 F (37.1 C) 99.1 F (37.3 C) 98.7 F (37.1 C)  TempSrc: Oral Oral Oral Oral  SpO2: 98% 97% 97% 98%  Weight:      Height:       Physical Exam General: Well appearing man, NAD. Room air. Heart: RRR; no murmur Lungs: CTA anteriorly Abdomen: soft Extremities: no LE edema Dialysis Access: LUE AVF + bruit  Additional Objective Labs: Basic Metabolic Panel: Recent Labs  Lab 02/19/23 0434 02/20/23 0153 02/21/23 0533  NA 135 135 134*  K 4.6 4.8 4.7  CL 93* 93* 93*  CO2 25 22 25   GLUCOSE 93 77 68*  BUN 39* 54* 34*  CREATININE 14.23* 16.03* 10.93*  CALCIUM 9.1 9.2 9.4  PHOS 8.0* 7.5* 5.3*   Liver Function Tests: Recent Labs  Lab 02/18/23 0900 02/19/23 0434 02/20/23 0153 02/21/23 0533  AST 18  --   --   --   ALT 13  --   --   --   ALKPHOS  87  --   --   --   BILITOT 0.7  --   --   --   PROT 6.8  --   --   --   ALBUMIN 3.5 3.1* 3.3* 3.1*   CBC: Recent Labs  Lab 02/18/23 0900 02/18/23 0903 02/19/23 0434 02/20/23 0153 02/21/23 0533  WBC 5.6  --  5.2 6.7 6.4  NEUTROABS 3.7  --   --   --   --   HGB 11.1*   < > 11.4* 10.5* 11.3*  HCT 34.6*   < > 34.2* 30.5* 33.9*  MCV 98.0  --  96.6 91.0 96.9  PLT 196  --  210 206 218   < > = values in this interval not displayed.   Studies/Results: ECHOCARDIOGRAM COMPLETE BUBBLE STUDY  Result Date: 02/19/2023    ECHOCARDIOGRAM REPORT   Patient Name:   Tanner Rollins Date of Exam: 02/19/2023 Medical Rec #:  [id number]       Height:       72.0 in Accession #:    [id number]      Weight:       165.0 lb Date of Birth:  01-19-1951  BSA:          1.963 m Patient Age:    71 years        BP:           181/88 mmHg Patient Gender: M               HR:           68 bpm. Exam Location:  Inpatient Procedure: 2D Echo, Cardiac Doppler, Color Doppler and Saline Contrast Bubble            Study Indications:    stroke  History:        Patient has prior history of Echocardiogram examinations, most                 recent 01/24/2022. End stage renal disease, Arrythmias:Atrial                 Fibrillation; Risk Factors:Hypertension.  Sonographer:    Delcie Roch RDCS Referring Phys: [id number] Marquita Palms VINCENT IMPRESSIONS  1. Left ventricular ejection fraction, by estimation, is 65 to 70%. The left ventricle has normal function. The left ventricle has no regional wall motion abnormalities. There is moderate left ventricular hypertrophy. Left ventricular diastolic parameters are consistent with Grade II diastolic dysfunction (pseudonormalization). Elevated left atrial pressure.  2. Right ventricular systolic function is normal. The right ventricular size is normal. Tricuspid regurgitation signal is inadequate for assessing PA pressure.  3. Left atrial size was moderately dilated.  4. The mitral valve is normal in  structure. Trivial mitral valve regurgitation. No evidence of mitral stenosis.  5. The aortic valve is tricuspid. Aortic valve regurgitation is not visualized. No aortic stenosis is present.  6. The inferior vena cava is normal in size with <50% respiratory variability, suggesting right atrial pressure of 8 mmHg.  7. Agitated saline contrast bubble study was negative, with no evidence of any interatrial shunt. FINDINGS  Left Ventricle: Left ventricular ejection fraction, by estimation, is 65 to 70%. The left ventricle has normal function. The left ventricle has no regional wall motion abnormalities. The left ventricular internal cavity size was normal in size. There is  moderate left ventricular hypertrophy. Left ventricular diastolic parameters are consistent with Grade II diastolic dysfunction (pseudonormalization). Elevated left atrial pressure. Right Ventricle: The right ventricular size is normal. No increase in right ventricular wall thickness. Right ventricular systolic function is normal. Tricuspid regurgitation signal is inadequate for assessing PA pressure. Left Atrium: Left atrial size was moderately dilated. Right Atrium: Right atrial size was normal in size. Pericardium: There is no evidence of pericardial effusion. Mitral Valve: The mitral valve is normal in structure. Trivial mitral valve regurgitation. No evidence of mitral valve stenosis. Tricuspid Valve: The tricuspid valve is normal in structure. Tricuspid valve regurgitation is trivial. Aortic Valve: The aortic valve is tricuspid. Aortic valve regurgitation is not visualized. No aortic stenosis is present. Pulmonic Valve: The pulmonic valve was not well visualized. Pulmonic valve regurgitation is not visualized. Aorta: The aortic root is normal in size and structure. Venous: The inferior vena cava is normal in size with less than 50% respiratory variability, suggesting right atrial pressure of 8 mmHg. IAS/Shunts: The interatrial septum was not  well visualized. Agitated saline contrast was given intravenously to evaluate for intracardiac shunting. Agitated saline contrast bubble study was negative, with no evidence of any interatrial shunt.  LEFT VENTRICLE PLAX 2D LVIDd:         4.40 cm   Diastology LVIDs:  2.60 cm   LV e' medial:    6.09 cm/s LV PW:         1.50 cm   LV E/e' medial:  18.1 LV IVS:        1.30 cm   LV e' lateral:   8.16 cm/s LVOT diam:     2.20 cm   LV E/e' lateral: 13.5 LV SV:         107 LV SV Index:   55 LVOT Area:     3.80 cm  RIGHT VENTRICLE             IVC RV Basal diam:  3.00 cm     IVC diam: 1.60 cm RV S prime:     15.10 cm/s TAPSE (M-mode): 2.1 cm LEFT ATRIUM              Index        RIGHT ATRIUM           Index LA diam:        4.50 cm  2.29 cm/m   RA Area:     20.80 cm LA Vol (A2C):   101.0 ml 51.44 ml/m  RA Volume:   58.70 ml  29.90 ml/m LA Vol (A4C):   73.5 ml  37.44 ml/m LA Biplane Vol: 86.3 ml  43.96 ml/m  AORTIC VALVE LVOT Vmax:   133.00 cm/s LVOT Vmean:  89.300 cm/s LVOT VTI:    0.282 m  AORTA Ao Root diam: 3.20 cm MITRAL VALVE MV Area (PHT): 4.68 cm     SHUNTS MV Decel Time: 162 msec     Systemic VTI:  0.28 m MV E velocity: 110.00 cm/s  Systemic Diam: 2.20 cm MV A velocity: 98.10 cm/s MV E/A ratio:  1.12 Epifanio Lesches MD Electronically signed by Epifanio Lesches MD Signature Date/Time: 02/19/2023/3:02:44 PM    Final    EEG adult  Result Date: 02/19/2023 Charlsie Quest, MD     02/19/2023 10:12 AM Patient Name: Tanner Rollins MRN: [id number] Epilepsy Attending: Charlsie Quest Referring Physician/Provider: Gwenevere Abbot, MD Date: 02/18/2023 Duration: 26.03 mins Patient history: 72 y.o. male with a past medical history of PAF on eliquis, seizure history- discharged 08/03/2022 on 500mg  keppra BID, HTN, ESRD on HD (fills medications through [place] in [place]) presenting with left side weakness, left facial droop, confusion. Level of alertness: Awake, asleep AEDs during EEG study: LEV Technical  aspects: This EEG study was done with scalp electrodes positioned according to the 10-20 International system of electrode placement. Electrical activity was reviewed with band pass filter of 1-70Hz , sensitivity of 7 uV/mm, display speed of 58mm/sec with a 60Hz  notched filter applied as appropriate. EEG data were recorded continuously and digitally stored.  Video monitoring was available and reviewed as appropriate. Description: The posterior dominant rhythm consists of 8 Hz activity of moderate voltage (25-35 uV) seen predominantly in posterior head regions, symmetric and reactive to eye opening and eye closing. Sleep was characterized by vertex waves, sleep spindles (12 to 14 Hz), maximal frontocentral region. EEG showed continuous 3 to 6 Hz theta-delta slowing in right hemisphere. Physiologic photic driving was not seen during photic stimulation.  Hyperventilation was not performed.   ABNORMALITY - Continuous slow, right hemisphere IMPRESSION: This study is suggestive of cortical dysfunction arising from right hemisphere likely secondary to underlying stroke. No seizures or epileptiform discharges were seen throughout the recording. Priyanka Annabelle Harman    Medications:  levETIRAcetam Stopped (02/20/23 1325)   levETIRAcetam 500 mg (  02/20/23 2103)    (feeding supplement) PROSource Plus  30 mL Oral BID BM   amiodarone  200 mg Oral Daily   apixaban  5 mg Oral BID   atorvastatin  40 mg Oral Daily   Chlorhexidine Gluconate Cloth  6 each Topical Daily   sevelamer carbonate  1,600 mg Oral TID WC   sodium chloride flush  3 mL Intravenous Once

## 2023-02-22 DIAGNOSIS — D631 Anemia in chronic kidney disease: Secondary | ICD-10-CM | POA: Diagnosis not present

## 2023-02-22 DIAGNOSIS — G4700 Insomnia, unspecified: Secondary | ICD-10-CM | POA: Diagnosis not present

## 2023-02-22 DIAGNOSIS — N186 End stage renal disease: Secondary | ICD-10-CM | POA: Diagnosis not present

## 2023-02-22 DIAGNOSIS — I639 Cerebral infarction, unspecified: Secondary | ICD-10-CM | POA: Diagnosis not present

## 2023-02-22 LAB — HEPATITIS B SURFACE ANTIGEN: Hepatitis B Surface Ag: NONREACTIVE

## 2023-02-22 LAB — CBC
HCT: 34.4 % — ABNORMAL LOW (ref 39.0–52.0)
Hemoglobin: 11.4 g/dL — ABNORMAL LOW (ref 13.0–17.0)
MCH: 31.9 pg (ref 26.0–34.0)
MCHC: 33.1 g/dL (ref 30.0–36.0)
MCV: 96.4 fL (ref 80.0–100.0)
Platelets: 228 10*3/uL (ref 150–400)
RBC: 3.57 MIL/uL — ABNORMAL LOW (ref 4.22–5.81)
RDW: 14.9 % (ref 11.5–15.5)
WBC: 5.6 10*3/uL (ref 4.0–10.5)
nRBC: 0 % (ref 0.0–0.2)

## 2023-02-22 LAB — RENAL FUNCTION PANEL
Albumin: 3.2 g/dL — ABNORMAL LOW (ref 3.5–5.0)
Anion gap: 15 (ref 5–15)
BUN: 47 mg/dL — ABNORMAL HIGH (ref 8–23)
CO2: 26 mmol/L (ref 22–32)
Calcium: 9.5 mg/dL (ref 8.9–10.3)
Chloride: 94 mmol/L — ABNORMAL LOW (ref 98–111)
Creatinine, Ser: 13.87 mg/dL — ABNORMAL HIGH (ref 0.61–1.24)
GFR, Estimated: 3 mL/min — ABNORMAL LOW (ref >60)
Glucose, Bld: 94 mg/dL (ref 70–99)
Phosphorus: 5.5 mg/dL — ABNORMAL HIGH (ref 2.5–4.6)
Potassium: 5.1 mmol/L (ref 3.5–5.1)
Sodium: 135 mmol/L (ref 135–145)

## 2023-02-22 MED ORDER — PENTAFLUOROPROP-TETRAFLUOROETH EX AERO: 1.0000 | INHALATION_SPRAY | CUTANEOUS | Status: DC | PRN

## 2023-02-22 MED ORDER — HEPARIN SODIUM (PORCINE) 1000 UNIT/ML DIALYSIS: 1000.0000 [IU] | INTRAMUSCULAR | Status: DC | PRN

## 2023-02-22 MED ORDER — ALTEPLASE 2 MG IJ SOLR: 2.0000 mg | Freq: Once | INTRAMUSCULAR | Status: DC | PRN

## 2023-02-22 MED ORDER — LEVETIRACETAM 250 MG PO TABS
500.0000 mg | ORAL_TABLET | Freq: Every day | ORAL | Status: DC
  Administered 2023-02-23 – 2023-03-01 (×7): 500 mg via ORAL
  Filled 2023-02-22: qty 1
  Filled 2023-02-22 (×8): qty 2

## 2023-02-22 MED ORDER — LIDOCAINE HCL (PF) 1 % IJ SOLN: 5.0000 mL | INTRAMUSCULAR | Status: DC | PRN

## 2023-02-22 MED ORDER — ANTICOAGULANT SODIUM CITRATE 4% (200MG/5ML) IV SOLN: 5.0000 mL | Status: DC | PRN

## 2023-02-22 MED ORDER — LIDOCAINE-PRILOCAINE 2.5-2.5 % EX CREA: 1.0000 | TOPICAL_CREAM | CUTANEOUS | Status: DC | PRN

## 2023-02-22 MED ORDER — HEPARIN SODIUM (PORCINE) 1000 UNIT/ML DIALYSIS
3000.0000 [IU] | INTRAMUSCULAR | Status: DC | PRN
  Administered 2023-02-22: 3000 [IU] via INTRAVENOUS_CENTRAL
  Filled 2023-02-22: qty 3

## 2023-02-22 NOTE — Evaluation (Signed)
Physical Therapy Assessment and Plan  Patient Details  Name: Tanner Rollins MRN: [id number] Date of Birth: 1950-09-16  PT Diagnosis: Difficulty walking, Hemiparesis non-dominant, and Impaired cognition Rehab Potential: Good ELOS: 7-10 days   Today's Date: 02/22/2023 PT Individual Time: 4098-1191 PT Individual Time Calculation (min): 73 min    Hospital Problem: Principal Problem:   CVA (cerebral vascular accident) [facility])   Past Medical History:  Past Medical History:  Diagnosis Date   ESRD on hemodialysis (HCC)    [facility]   Hypertension    PAF (paroxysmal atrial fibrillation) (HCC)    Seizure disorder (HCC)    Past Surgical History:  Past Surgical History:  Procedure Laterality Date   ANGIOPLASTY Left 06/10/2022   Procedure: Balloon VENOPLASTY;  Surgeon: Chuck Hint, MD;  Location: [facility] OR;  Service: Vascular;  Laterality: Left;   AV FISTULA PLACEMENT Left 01/28/2022   Procedure: ARTERIOVENOUS (AV) FISTULA CREATION VS.GRAFT ARM;  Surgeon: Chuck Hint, MD;  Location: [facility] OR;  Service: Vascular;  Laterality: Left;   FISTULOGRAM Left 06/10/2022   Procedure: FISTULOGRAM;  Surgeon: Chuck Hint, MD;  Location: [facility] OR;  Service: Vascular;  Laterality: Left;   IR FLUORO GUIDE CV LINE RIGHT  01/24/2022   IR REMOVAL TUN CV CATH W/O FL  10/04/2022   IR THORACENTESIS ASP PLEURAL SPACE W/IMG GUIDE  02/12/2022   IR US GUIDE VASC ACCESS RIGHT  01/24/2022    Assessment & Plan Clinical Impression: Patient is a 72 y.o. male with history of HTN, ESRD- HD MWF, BPH with elevated PSA (being worked up by [place]), A Fib, medication non-compliance  who was admitted on 02/18/23 with left sided weakness with decreased sensation, was disoriented and noted to have intermittent LUE flaccidity with intermittent right gaze deviation. He had seizure enroute to [facility] ED treated with IV ativan. Not a candidate for thrombolytic and family reported that patient had stopped taking his  Keppra 6 months ago.  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  He was loaded with Keppra and EEG done revealing cortical dysfunction from right hemisphere and no seizures. Dr. Pearlean Brownie felt to be due to non-adherence to Eliquis.  2D echo showed EF 65-70% with moderate LVH, moderately dilated LA and negative bubble study.    BP continues to be labile and permissive HTN recommended with HD goal to keep SBP>105. PT/OT has been working with patient who requires min assist with ADLs,has delay in processing with decreased STM as well as cues for safe walker use. Patient was independent and driving PTA. CIR recommended due to functional decline.  Patient transferred to CIR on 02/21/2023 .   Patient currently requires min assist with mobility secondary to decreased cardiorespiratoy endurance, decreased coordination, ,, decreased memory, and decreased standing balance and decreased balance strategies.  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  Home access is  Elevator, Level entry.  Patient will benefit from skilled PT intervention to maximize safe functional mobility, minimize fall risk, and decrease caregiver burden for planned discharge home with intermittent assist.  Anticipate patient will benefit from follow up [facility] at discharge.  PT - End of Session Activity Tolerance: Tolerates 30+ min activity  with multiple rests Endurance Deficit: Yes PT Assessment Rehab Potential (ACUTE/IP ONLY): Good PT Barriers to Discharge: Lack of/limited family support;Hemodialysis;Weight;Medication compliance PT Patient demonstrates  impairments in the following area(s): Balance;Endurance;Motor;Safety;Perception PT Transfers Functional Problem(s): Bed Mobility;Bed to Chair;Car;Furniture PT Locomotion Functional Problem(s): Ambulation;Stairs PT Plan PT Intensity: Minimum of 1-2 x/day ,45 to 90 minutes PT Frequency: 5 out of 7 days PT Duration Estimated Length of Stay: 7-10 days PT Treatment/Interventions: Ambulation/gait training;DME/adaptive equipment instruction;Neuromuscular re-education;Psychosocial support;Stair training;UE/LE Strength taining/ROM;Balance/vestibular training;Discharge planning;Functional electrical stimulation;Skin care/wound management;Therapeutic Activities;UE/LE Coordination activities;Visual/perceptual remediation/compensation;Therapeutic Exercise;Patient/family education;Functional mobility training;Disease management/prevention;Cognitive remediation/compensation;Pain management PT Transfers Anticipated Outcome(s): Mod I PT Locomotion Anticipated Outcome(s): Mod I/ supervision PT Recommendation Recommendations for Other Services: Therapeutic Recreation consult Therapeutic Recreation Interventions: Stress management;Kitchen group;Outing/community reintergration Follow Up Recommendations: Home health PT;Outpatient PT Patient destination: Home Equipment Recommended: To be determined   PT Evaluation Precautions/Restrictions Precautions Precautions: Fall Precaution Comments: mild L hemipareisis with balance deficits, HD TTSa, hx of seizures Restrictions Weight Bearing Restrictions: No General   Vital Signs Pain Pain Assessment Pain Scale: 0-10 Pain Score: 0-No pain Pain Interference Pain Interference Pain Effect on Sleep: 1. Rarely or not at all Pain Interference with Therapy Activities: 1. Rarely or not at all Pain Interference with Day-to-Day Activities: 1. Rarely or not at all Home Living/Prior Functioning Home Living Available Help at Discharge: Family;Available 24 hours/day (sister  checks on him 3x/day) 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) Home Access: Elevator;Level entry Home Layout: One level Bathroom Shower/Tub: Walk-in shower;Sponge bathes at baseline (has safety rails) Bathroom Toilet: Handicapped height Bathroom Accessibility: Yes  Lives With: Alone Prior Function Level of Independence: Independent with basic ADLs;Independent with homemaking with ambulation;Independent with gait;Independent with transfers  Able to Take Stairs?: Reciprically Driving: Yes Vocation: Retired Optometrist - History Ability to See in Adequate Light: 0 Adequate Vision - Assessment Eye Alignment: Within Functional Limits Ocular Range of Motion: Within Functional Limits Alignment/Gaze Preference: Within Defined Limits Tracking/Visual Pursuits: Able to track stimulus in all quads without difficulty Saccades: Within functional limits Convergence: Within functional limits Perception Perception: Within Functional Limits Praxis Praxis: Intact  Cognition Overall Cognitive Status: Within Functional Limits for tasks assessed Arousal/Alertness: Awake/alert Orientation Level: Oriented X4 Memory: Impaired Awareness: Appears intact Problem Solving: Appears intact Safety/Judgment: Appears intact Sensation Sensation Light Touch: Appears Intact Hot/Cold: Appears Intact Proprioception: Appears Intact Coordination Gross Motor Movements are Fluid and Coordinated: No Fine Motor Movements are Fluid and Coordinated: No Heel Shin Test: slightly slower with L >R Motor  Motor Motor: Hemiplegia Motor - Skilled Clinical Observations: mild L hemipareisis   Trunk/Postural Assessment  Cervical Assessment Cervical Assessment: Exceptions to [facility] (forward head) Thoracic Assessment Thoracic Assessment: Exceptions to WFL (slight rounding to shoulders) Lumbar Assessment Lumbar Assessment: Within Functional Limits Postural Control Postural Control: Within Functional  Limits  Balance Balance Balance Assessed: Yes Standardized Balance Assessment Standardized Balance Assessment: Functional Gait Assessment;Berg Balance Test Berg Balance Test Sit to Stand: Able to stand using hands after several tries Standing Unsupported: Able to stand 2 minutes with supervision Sitting with Back Unsupported but Feet Supported on Floor or Stool: Able to sit safely and securely 2 minutes Stand to Sit: Controls descent by using hands Transfers: Able to transfer safely, definite need of hands Standing Unsupported with Eyes Closed: Able to stand 10 seconds with supervision Standing Ubsupported with Feet Together: Able to place feet together independently and stand for 1 minute with supervision From Standing, Reach Forward with Outstretched Arm: Can reach forward >12 cm safely (  5") From Standing Position, Pick up Object from Floor: Able to pick up shoe safely and easily From Standing Position, Turn to Look Behind Over each Shoulder: Looks behind from both sides and weight shifts well Turn 360 Degrees: Able to turn 360 degrees safely but slowly Standing Unsupported, Alternately Place Feet on Step/Stool: Able to complete >2 steps/needs minimal assist (Completes >8 touches to step but starts with touching RLE only and requires vc in time for each LE) Standing Unsupported, One Foot in Front: Able to plae foot ahead of the other independently and hold 30 seconds Standing on One Leg: Tries to lift leg/unable to hold 3 seconds but remains standing independently Total Score: 39 Static Sitting Balance Static Sitting - Balance Support: Feet supported;No upper extremity supported Static Sitting - Level of Assistance: 7: Independent Dynamic Sitting Balance Dynamic Sitting - Balance Support: During functional activity;No upper extremity supported;Feet supported Dynamic Sitting - Level of Assistance: 6: Modified independent (Device/Increase time) Dynamic Sitting - Balance Activities:  Forward lean/weight shifting;Reaching for objects;Reaching for weighted objects;Reaching across midline Static Standing Balance Static Standing - Balance Support: During functional activity;No upper extremity supported Static Standing - Level of Assistance: 5: Stand by assistance Dynamic Standing Balance Dynamic Standing - Balance Support: No upper extremity supported Dynamic Standing - Level of Assistance: Other (comment);4: Min assist (CGA/ minA) Functional Gait  Assessment Gait assessed : Yes Gait Level Surface: Walks 20 ft in less than 7 sec but greater than 5.5 sec, uses assistive device, slower speed, mild gait deviations, or deviates 6-10 in outside of the 12 in walkway width. Change in Gait Speed: Able to change speed, demonstrates mild gait deviations, deviates 6-10 in outside of the 12 in walkway width, or no gait deviations, unable to achieve a major change in velocity, or uses a change in velocity, or uses an assistive device. (increased difficulty with slow speed) Gait with Horizontal Head Turns: Performs head turns smoothly with slight change in gait velocity (eg, minor disruption to smooth gait path), deviates 6-10 in outside 12 in walkway width, or uses an assistive device. Gait with Vertical Head Turns: Performs task with slight change in gait velocity (eg, minor disruption to smooth gait path), deviates 6 - 10 in outside 12 in walkway width or uses assistive device Gait and Pivot Turn: Pivot turns safely in greater than 3 sec and stops with no loss of balance, or pivot turns safely within 3 sec and stops with mild imbalance, requires small steps to catch balance. Step Over Obstacle: Is able to step over one shoe box (4.5 in total height) without changing gait speed. No evidence of imbalance. Gait with Narrow Base of Support: Ambulates 4-7 steps. Gait with Eyes Closed: Walks 20 ft, no assistive devices, good speed, no evidence of imbalance, normal gait pattern, deviates no more than  6 in outside 12 in walkway width. Ambulates 20 ft in less than 7 sec. Ambulating Backwards: Walks 20 ft, uses assistive device, slower speed, mild gait deviations, deviates 6-10 in outside 12 in walkway width. Steps: Alternating feet, no rail. Total Score: 21 FGA comment:: 21/ 30 = medium fall risk Extremity Assessment  RUE Assessment RUE Assessment: Within Functional Limits LUE Assessment LUE Assessment: Exceptions to [facility] LUE Body System: Neuro LUE Strength LUE Overall Strength: Deficits LUE Overall Strength Comments: 3+/4 Left Hand Gross Grasp: Functional RLE Assessment RLE Assessment: Within Functional Limits General Strength Comments: grossly 4+/5 prox to distal LLE Assessment LLE Assessment: Within Functional Limits General Strength Comments: grossly 4/5  prox to distal  Care Tool Care Tool Bed Mobility Roll left and right activity   Roll left and right assist level: Independent with assistive device Roll left and right assistive device comment: bed rail  Sit to lying activity   Sit to lying assist level: Supervision/Verbal cueing    Lying to sitting on side of bed activity   Lying to sitting on side of bed assist level: the ability to move from lying on the back to sitting on the side of the bed with no back support.: Supervision/Verbal cueing     Care Tool Transfers Sit to stand transfer   Sit to stand assist level: Contact Guard/Touching assist    Chair/bed transfer   Chair/bed transfer assist level: Contact Guard/Touching assist     Toilet transfer   Assist Level: Contact Guard/Touching assist    Car transfer   Car transfer assist level: Supervision/Verbal cueing      Care Tool Locomotion Ambulation   Assist level: Minimal Assistance - Patient > 75% Assistive device: No Device Max distance: 250 ft  Walk 10 feet activity   Assist level: Contact Guard/Touching assist Assistive device: No Device   Walk 50 feet with 2 turns activity   Assist level:  Minimal Assistance - Patient > 75% Assistive device: No Device  Walk 150 feet activity   Assist level: Minimal Assistance - Patient > 75% Assistive device: No Device  Walk 10 feet on uneven surfaces activity Walk 10 feet on uneven surfaces activity did not occur: Safety/medical concerns      Stairs   Assist level: Contact Guard/Touching assist Stairs assistive device: 2 hand rails Max number of stairs: 4  Walk up/down 1 step activity   Walk up/down 1 step (curb) assist level: Supervision/Verbal cueing Walk up/down 1 step or curb assistive device: 2 hand rails  Walk up/down 4 steps activity   Walk up/down 4 steps assist level: Contact Guard/Touching assist Walk up/down 4 steps assistive device: 2 hand rails  Walk up/down 12 steps activity Walk up/down 12 steps activity did not occur: Safety/medical concerns      Pick up small objects from floor   Pick up small object from the floor assist level: Supervision/Verbal cueing;Contact Guard/Touching assist    Wheelchair Is the patient using a wheelchair?: No   Wheelchair activity did not occur: N/A      Wheel 50 feet with 2 turns activity Wheelchair 50 feet with 2 turns activity did not occur: N/A    Wheel 150 feet activity Wheelchair 150 feet activity did not occur: N/A      Refer to Care Plan for Long Term Goals  SHORT TERM GOAL WEEK 1 PT Short Term Goal 1 (Week 1): STG = LTG d/t ELOS  Recommendations for other services: Adult nurse group, Stress management, and Outing/community reintegration  Skilled Therapeutic Intervention Mobility Bed Mobility Bed Mobility: Rolling Left;Left Sidelying to Sit Rolling Left: Supervision/Verbal cueing (used bedrail) Left Sidelying to Sit: Supervision/Verbal cueing Supine to Sit: Supervision/Verbal cueing Transfers Transfers: Sit to Stand;Stand to Sit;Stand Pivot Transfers Sit to Stand: Contact Guard/Touching assist Stand to Sit: Contact Guard/Touching assist Stand  Pivot Transfers: Contact Guard/Touching assist Transfer (Assistive device): None (uses Bil knee extension into bed/ chair in order to steady balance) Locomotion  Gait Ambulation: Yes Gait Assistance: Contact Guard/Touching assist;Minimal Assistance - Patient > 75% Gait Distance (Feet): 250 Feet Assistive device: None Gait Assistance Details: Verbal cues for precautions/safety Gait Gait: Yes Gait Pattern: Impaired Gait Pattern: Decreased step  length - left;Decreased step length - right;Step-through pattern;Decreased hip/knee flexion - right;Decreased hip/knee flexion - left (balance deficits cause missteps and variable step widths) Gait velocity: reduced High Level Ambulation High Level Ambulation: Backwards walking;Direction changes;Sudden stops;Head turns Backwards Walking: slow with adequate foot clearance and toe-to-heel progression Direction Changes: balance deficits requiring extra steps to maintain Sudden Stops: time on toes prior to return to foot flat Head Turns: deviations from path  with downward head tilt and with lateral head rotation to R >L Stairs / Additional Locomotion Stairs: Yes Stairs Assistance: Contact Guard/Touching assist Stair Management Technique: Two rails;Alternating pattern Number of Stairs: 4 Height of Stairs: 6 Wheelchair Mobility Wheelchair Mobility: No  Skilled Intervention: PT Evaluation completed; see above for results. PT educated patient in roles of PT vs OT, PT POC, rehab potential, rehab goals, and discharge recommendations along with recommendation for follow-up rehabilitation services. Individual treatment initiated:  Patient supine in bed asleep upon PT arrival. Patient alert and agreeable to PT session. No pain complaint during session.  Therapeutic Activity: Bed Mobility: Patient performed supine to/from sit with supervision. No vc provided. Is abel to dress UB with Mod I/ sup and requires light MinA for threading feet into pants in seated  position on EOB.  Transfers: Patient performs sit<>stand with need for Bil knee extension into seated surface for assist with balance. With this assist, he only requires sup/ CGA. Without, he requires CGA/ light MinA for attaining standing balance. Stands at sink with support of pelvis into sink to assist with balance d/t weakness. Is able to complete brushing of teeth, washing of face and hands with no assist and only provision of tools required.   Gait Training:  Patient ambulated ~250 feet per bout using no AD with CGA and intermittent need for MinA d/t missteps from balance deficits. Provided vc/tc for upright posture and level gaze. See FGA for assessment.   Neuromuscular Re-ed: NMR facilitated during session with focus on standing balance assessment. Pt guided in Berg Balance Test and Functional Gait Assessment.See above "balance" section for testing. Pt scores 39/ 56 on Berg demonstrating a significant fall risk (>80% chance). And then scores 21/ 30 on FGA demonstrating a medium fall risk. Both demonstrate need for balance training during rehab stay. Pt in agreement.  NMR performed for improvements in motor control and coordination, balance, sequencing, judgement, and self confidence/ efficacy in performing all aspects of mobility at highest level of independence.   Patient seated upright  in recliner at end of session with brakes locked, belt alarm set, and all needs within reach. Breakfast tray setup in front of pt with call bell by his side.   Discharge Criteria: Patient will be discharged from PT if patient refuses treatment 3 consecutive times without medical reason, if treatment goals not met, if there is a change in medical status, if patient makes no progress towards goals or if patient is discharged from hospital.  The above assessment, treatment plan, treatment alternatives and goals were discussed and mutually agreed upon: by patient  Loel Dubonnet PT, DPT, CSRS 02/22/2023, 1:13 PM

## 2023-02-22 NOTE — Progress Notes (Signed)
PROGRESS NOTE   Subjective/Complaints:  Slept poorly last night, didn't know he had to ask for trazodone. Will ask for it tonight.  Denies pain.  LBM yesterday. Not documented.  States he urinates ok? Gets dialyzed today.  Denies any other complaints or concerns at this time.   ROS: as per HPI. Denies CP, SOB, abd pain, N/V/D/C, or any other complaints at this time.    Objective:   No results found. Recent Labs    02/21/23 0533 02/22/23 1028  WBC 6.4 5.6  HGB 11.3* 11.4*  HCT 33.9* 34.4*  PLT 218 228   Recent Labs    02/20/23 0153 02/21/23 0533  NA 135 134*  K 4.8 4.7  CL 93* 93*  CO2 22 25  GLUCOSE 77 68*  BUN 54* 34*  CREATININE 16.03* 10.93*  CALCIUM 9.2 9.4   No intake or output data in the 24 hours ending 02/22/23 1052      Physical Exam: Vital Signs Blood pressure (!) 170/78, pulse 63, temperature 98.5 F (36.9 C), temperature source Oral, resp. rate 18, height 6' (1.829 m), weight 73.9 kg, SpO2 97 %.  Constitutional: No distress . Vital signs reviewed. Resting in bed.  HEENT: NCAT, EOMI, oral membranes moist Neck: supple Cardiovascular: RRR without murmur. No JVD. Bruit LUE AVF Respiratory/Chest: CTA Bilaterally without wheezes or rales. Normal effort    GI/Abdomen: BS +, non-tender, non-distended Ext: no clubbing, cyanosis, or edema Psych: pleasant and cooperative Skin: dry, warm. Healed lesion on L forearm.  MsK: moves extremities antigravity but full msk testing not done.   PRIOR EXAMS: Neuro: Alert and oriented x 3. Mild left facial weakness with mild to moderate dysarthria (although pt feels that he's close to baseline from speech standpoint). Demonstrates fair insight and awareness. Follows all basic commands. Motor: RUE 4+/5 prox to distal. LUE 4- to 4/5 prox to distal. RLE 4+/5. RLE 4/5 prox to distal. Sensory exam normal for light touch and pain RUE and RLE. May be slightly diminished  LUE and LLE. No limb ataxia or cerebellar signs. No abnormal tone appreciated   Assessment/Plan: 1. Functional deficits which require 3+ hours per day of interdisciplinary therapy in a comprehensive inpatient rehab setting. Physiatrist is providing close team supervision and 24 hour management of active medical problems listed below. Physiatrist and rehab team continue to assess barriers to discharge/monitor patient progress toward functional and medical goals  Care Tool:  Bathing              Bathing assist       Upper Body Dressing/Undressing Upper body dressing        Upper body assist      Lower Body Dressing/Undressing Lower body dressing            Lower body assist       Toileting Toileting    Toileting assist       Transfers Chair/bed transfer  Transfers assist           Locomotion Ambulation   Ambulation assist              Walk 10 feet activity   Assist  Walk 50 feet activity   Assist           Walk 150 feet activity   Assist           Walk 10 feet on uneven surface  activity   Assist           Wheelchair     Assist               Wheelchair 50 feet with 2 turns activity    Assist            Wheelchair 150 feet activity     Assist          Blood pressure (!) 170/78, pulse 63, temperature 98.5 F (36.9 C), temperature source Oral, resp. rate 18, height 6' (1.829 m), weight 73.9 kg, SpO2 97 %.  Medical Problem List and Plan: 1. 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  Antithrombotics: -DVT/anticoagulation:  Pharmaceutical: Eliquis 5mg  BID             -antiplatelet therapy: N/a 3. Pain Management: tylenol prn.  4. Mood/Behavior/Sleep: LCSW to follow for evaluation and support.              --trazodone  prn added for insomnia. Used tylenol PM occasionally at home.    -02/22/23 reminded pt about PRN trazodone, will try this tonight             -antipsychotic agents: N/A 5. Neuropsych/cognition: This patient is capable of making decisions on his  own behalf. 6. Skin/Wound Care: routine pressure relief measures.  7. Fluids/Electrolytes/Nutrition:  Strict I/O. 1200 cc FR/day 8. HTN: Monitor BP TID--permissive HTN for next 1-2 days (~02/24/23).  --continue to HOLD diltiazem 180mg  QD, coreg (?), clonidine 0.1mg  BID.    -02/22/23 BP elevated but <220/110, continue to allow for permissive HTN Vitals:   02/21/23 1524 02/21/23 1949 02/22/23 0511  BP: (!) 186/95 (!) 192/91 (!) 170/78    9. A fib: Monitor HR TID--on amiodarone 200mg  QD and Eliquis.   -02/22/23 HR controlled, monitor    02/22/2023    5:11 AM 02/22/2023    5:00 AM 02/21/2023    7:49 PM  Vitals with BMI  Weight  162 lbs 15 oz   BMI  22.09   Systolic 170  192  Diastolic 78  91  Pulse 63  80    10 ESRD: Schedule HD at the end of the day on TTS. 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  11. Seizures hx: Change Keppra to po with 500mg  BID and additional 250 mg on TTS after HD 12. Anemia of chronic disease: Stable. Serial CBC with HD.  13.  Dyslipidemia: HDL-29  Trig-168. On Lipitor 40mg  QD.    LOS: 1 days A FACE TO FACE EVALUATION WAS PERFORMED  [address] 02/22/2023, 10:52 AM

## 2023-02-22 NOTE — Evaluation (Addendum)
Occupational Therapy Assessment and Plan  Patient Details  Name: Tanner Rollins MRN: [id number] Date of Birth: February 29, 1951  OT Diagnosis: muscle weakness (generalized) and mild hemiparesis, decreased activity tolerance Rehab Potential: Rehab Potential (ACUTE ONLY): Good ELOS: 7-10 days   Today's Date: 02/22/2023 OT Individual Time: 9528-4132 OT Individual Time Calculation (min): 25 min  and Today's Date: 02/22/2023 OT Missed Time: 50 Minutes Missed Time Reason: Patient fatigue    Hospital Problem: Principal Problem:   CVA (cerebral vascular accident) [facility])   Past Medical History:  Past Medical History:  Diagnosis Date   ESRD on hemodialysis (HCC)    [facility]   Hypertension    PAF (paroxysmal atrial fibrillation) (HCC)    Seizure disorder (HCC)    Past Surgical History:  Past Surgical History:  Procedure Laterality Date   ANGIOPLASTY Left 06/10/2022   Procedure: Balloon VENOPLASTY;  Surgeon: Chuck Hint, MD;  Location: [facility] OR;  Service: Vascular;  Laterality: Left;   AV FISTULA PLACEMENT Left 01/28/2022   Procedure: ARTERIOVENOUS (AV) FISTULA CREATION VS.GRAFT ARM;  Surgeon: Chuck Hint, MD;  Location: [facility] OR;  Service: Vascular;  Laterality: Left;   FISTULOGRAM Left 06/10/2022   Procedure: FISTULOGRAM;  Surgeon: Chuck Hint, MD;  Location: [facility] OR;  Service: Vascular;  Laterality: Left;   IR FLUORO GUIDE CV LINE RIGHT  01/24/2022   IR REMOVAL TUN CV CATH W/O FL  10/04/2022   IR THORACENTESIS ASP PLEURAL SPACE W/IMG GUIDE  02/12/2022   IR US GUIDE VASC ACCESS RIGHT  01/24/2022    Assessment & Plan Clinical Impression: Patientis a 72 year old male with history of HTN, ESRD- HD MWF, BPH with elevated PSA (being worked up by [place]), A Fib, medication non-compliance  who was admitted on 02/18/23 with left sided weakness with decreased sensation, was disoriented and noted to have intermittent LUE flaccidity with intermittent right gaze deviation. He had  seizure enroute to [facility] ED treated with IV ativan. Not a candidate for thrombolytic and family reported that patient had stopped taking his Keppra 6 months ago.  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  He was loaded with Keppra and EEG done revealing cortical dysfunction from right hemisphere and no seizures. Dr. Pearlean Brownie felt to be due to non-adherence to Eliquis.  2D echo showed EF 65-70% with moderate LVH, moderately dilated LA and negative bubble study.    BP continues to be labile and permissive HTN recommended with HD goal to keep SBP>105. PT/OT has been working with patient who requires min assist with ADLs,has delay in processing with decreased STM as well as cues for safe walker use.  Patient transferred to CIR on 02/21/2023 .    Patient currently requires  CGA  with basic self-care skills secondary to muscle weakness, decreased cardiorespiratoy endurance, and decreased standing balance and decreased balance strategies.  Prior to hospitalization, patient could complete BADLs independently.   Patient will benefit from skilled intervention to increase independence with basic self-care skills prior to discharge home with care partner.  Anticipate patient will require 24 hour supervision and follow up home health.  OT - End of Session Activity Tolerance: Decreased this session Endurance Deficit: Yes OT Assessment Rehab Potential (ACUTE ONLY): Good OT Barriers to Discharge:  Hemodialysis OT Patient demonstrates impairments in the following area(s): Balance;Endurance OT Basic ADL's Functional Problem(s): Bathing;Dressing;Toileting OT Transfers Functional Problem(s): Toilet;Tub/Shower OT Additional  Impairment(s): Fuctional Use of Upper Extremity OT Plan OT Intensity: Minimum of 1-2 x/day, 45 to 90 minutes OT Frequency: 5 out of 7 days OT Duration/Estimated Length of Stay: 7-10 days OT Treatment/Interventions: Balance/vestibular training;Community reintegration;Discharge planning;Disease mangement/prevention;DME/adaptive equipment instruction;Functional electrical stimulation;Functional mobility training;Neuromuscular re-education;Patient/family education;Self Care/advanced ADL retraining;Therapeutic Activities;Therapeutic Exercise;UE/LE Strength taining/ROM;UE/LE Coordination activities OT Basic Self-Care Anticipated Outcome(s): Mod I OT Toileting Anticipated Outcome(s): Mod I OT Bathroom Transfers Anticipated Outcome(s): Mod I OT Recommendation Patient destination: Home Follow Up Recommendations: Home health OT Equipment Recommended: To be determined   OT Evaluation Precautions/Restrictions  Precautions Precautions: Fall Precaution Comments: mild L hemipareisis with balance deficits, HD TTSa, hx of seizures Restrictions Weight Bearing Restrictions: No General Chart Reviewed: Yes Family/Caregiver Present: No Pain Pain Assessment Pain Scale: 0-10 Pain Score: 0-No pain Home Living/Prior Functioning Home Living Family/patient expects to be discharged to:: Private residence Living Arrangements: Spouse/significant other Available Help at Discharge: Family, Available 24 hours/day (sister checks on him 3x/day) 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) Home Access: Elevator, Level entry Home Layout: One level Bathroom Shower/Tub: Walk-in shower, Sponge bathes at baseline (has safety rails) Bathroom Toilet: Handicapped height Bathroom Accessibility: Yes  Lives With: Alone IADL History Homemaking Responsibilities: Yes Bill Paying/Finance Responsibility: Primary Homemaking Comments: Medication management Current License: Yes Occupation: Retired Prior Function Level  of Independence: Independent with basic ADLs, Independent with homemaking with ambulation, Independent with gait, Independent with transfers  Able to Take Stairs?: Reciprically Driving: Yes Vocation: Retired Administrator, sports Baseline Vision/History: 1 Wears glasses (readers) Ability to See in Adequate Light: 0 Adequate Patient Visual Report: No change from baseline (Relates being far-sighted and has been referred for eye exam and glasses.) Vision Assessment?: No apparent visual deficits Eye Alignment: Within Functional Limits Ocular Range of Motion: Within Functional Limits Alignment/Gaze Preference: Within Defined Limits Tracking/Visual Pursuits: Able to track stimulus in all quads without difficulty Saccades: Within functional limits Convergence: Within functional limits Visual Fields: No apparent deficits Perception  Perception: Within Functional Limits Praxis Praxis: Intact Cognition Cognition Overall Cognitive Status: Within Functional Limits for tasks assessed Arousal/Alertness: Awake/alert Orientation Level: Place;Person;Situation Memory: Impaired Awareness: Appears intact Problem Solving: Appears intact Safety/Judgment: Appears intact Brief Interview for Mental Status (BIMS) Repetition of Three Words (First Attempt): 3 Temporal Orientation: Year: Correct Temporal Orientation: Month: Accurate within 5 days Temporal Orientation: Day: Correct Recall: "Sock": Yes, no cue required Recall: "Blue": Yes, no cue required Recall: "Bed": Yes, no cue required BIMS Summary Score: 15 Sensation Sensation Light Touch: Appears Intact Hot/Cold: Appears Intact Proprioception: Appears Intact Coordination Gross Motor Movements are Fluid and Coordinated: No Fine Motor Movements are Fluid and Coordinated: No Heel Shin Test: slightly slower with L >R Motor  Motor Motor: Hemiplegia Motor - Skilled Clinical Observations: mild L hemipareisis  Trunk/Postural Assessment  Cervical  Assessment Cervical Assessment: Within Functional Limits Thoracic Assessment Thoracic Assessment: Within Functional Limits Lumbar Assessment Lumbar Assessment: Within Functional Limits Postural Control Postural Control: Within Functional Limits  Balance Balance Balance Assessed: Yes Standardized Balance Assessment Standardized Balance Assessment: Functional Gait Assessment;Berg Balance Test Static Sitting Balance Static Sitting - Balance Support: Feet supported Static Sitting - Level of Assistance: 7: Independent Dynamic Sitting Balance Dynamic Sitting - Balance Support: During functional activity Dynamic Sitting - Level of Assistance: 5: Stand by assistance Dynamic Sitting - Balance Activities: Forward lean/weight shifting;Reaching for objects Static Standing Balance Static Standing -  Balance Support: During functional activity;No upper extremity supported Static Standing - Level of Assistance: 5: Stand by assistance Extremity/Trunk Assessment RUE Assessment RUE Assessment: Within Functional Limits LUE Assessment LUE Assessment: Exceptions to [facility] LUE Body System: Neuro LUE Strength LUE Overall Strength: Deficits LUE Overall Strength Comments: 3+/4 Left Hand Gross Grasp: Functional  Care Tool Care Tool Self Care Eating   Eating Assist Level: Independent    Oral Care    Oral Care Assist Level: Supervision/Verbal cueing    Bathing   Body parts bathed by patient: Right arm;Right lower leg;Left lower leg;Left arm;Chest;Face;Abdomen;Front perineal area;Buttocks;Right upper leg;Left upper leg     Assist Level: Contact Guard/Touching assist    Upper Body Dressing(including orthotics)   What is the patient wearing?: Pull over shirt   Assist Level: Set up assist    Lower Body Dressing (excluding footwear)   What is the patient wearing?: Pants Assist for lower body dressing: Contact Guard/Touching assist    Putting on/Taking off footwear   What is the patient wearing?:  Socks;Shoes Assist for footwear: Set up assist       Care Tool Toileting Toileting activity Toileting Activity did not occur (Clothing management and hygiene only): N/A (no void or bm)       Care Tool Bed Mobility Roll left and right activity    Defer to PT eval     Sit to lying activity    Defer to PT eval     Lying to sitting on side of bed activity    Defer to PT eval      Care Tool Transfers Sit to stand transfer    Defer to PT eval     Chair/bed transfer    Defer to PT eval      Toilet transfer   Assist Level: Contact Guard/Touching assist     Care Tool Cognition  Expression of Ideas and Wants Expression of Ideas and Wants: 4. Without difficulty (complex and basic) - expresses complex messages without difficulty and with speech that is clear and easy to understand  Understanding Verbal and Non-Verbal Content Understanding Verbal and Non-Verbal Content: 4. Understands (complex and basic) - clear comprehension without cues or repetitions   Memory/Recall Ability Memory/Recall Ability : Current season;That he or she is in a hospital/hospital unit   Refer to Care Plan for Long Term Goals  SHORT TERM GOAL WEEK 1 OT Short Term Goal 1 (Week 1): STGs=LTGs due to patient's length of stay.  Recommendations for other services: None    Skilled Therapeutic Intervention Pt received resting in bed for skilled OT session with focus on comprehensive OT evaluation. Pt demonstrating overall pleasant mood. Pt with no reports of pain, but presenting with increased fatigue/lethargy.  OT offering intermediate rest breaks and positioning suggestions throughout session to address potential pain/fatigue and maximize participation/safety in session.   Session began with introduction to OT role, OT POC, and general orientation to rehab unit/schedule. Pt defers all ADL participation to later time due to increased fatigue. Pt willing to perform toilet transfer and short LB dressing simulation,  performing all with CGA + no AD. Pt missing ~50 minutes of skilled intervention due to fatigue/lethargy.   Pt remained resting in bed with all immediate needs met at end of session. Pt continues to be appropriate for skilled OT intervention to promote further functional independence.   ADL ADL Eating: Independent Where Assessed-Eating: Chair Grooming: Supervision/safety;Setup Where Assessed-Grooming: Standing at sink Upper Body Bathing: Supervision/safety Where Assessed-Upper Body Bathing: Edge of  bed Lower Body Bathing: Contact guard Where Assessed-Lower Body Bathing: Edge of bed Upper Body Dressing: Supervision/safety Where Assessed-Upper Body Dressing: Edge of bed Lower Body Dressing: Contact guard Where Assessed-Lower Body Dressing: Edge of bed Toileting: Contact guard Where Assessed-Toileting: Toilet;Bedside Commode Toilet Transfer: Furniture conservator/restorer Method: Proofreader: Bedside commode;Grab bars Film/video editor: Administrator, arts Method: Designer, industrial/product: Scientist, research (medical) Sit to Stand: Contact Guard/Touching assist Stand to Sit: Contact Guard/Touching assist   Discharge Criteria: Patient will be discharged from OT if patient refuses treatment 3 consecutive times without medical reason, if treatment goals not met, if there is a change in medical status, if patient makes no progress towards goals or if patient is discharged from hospital.  The above assessment, treatment plan, treatment alternatives and goals were discussed and mutually agreed upon: by patient  Lou Cal, OTR/L, MSOT  02/22/2023, 12:32 PM

## 2023-02-22 NOTE — Plan of Care (Signed)
  Problem: RH Balance Goal: LTG Patient will maintain dynamic standing with ADLs (OT) Description: LTG:  Patient will maintain dynamic standing balance with assist during activities of daily living (OT)  Flowsheets (Taken 02/22/2023 1241) LTG: Pt will maintain dynamic standing balance during ADLs with: Independent with assistive device   Problem: RH Bathing Goal: LTG Patient will bathe all body parts with assist levels (OT) Description: LTG: Patient will bathe all body parts with assist levels (OT) Flowsheets (Taken 02/22/2023 1241) LTG: Pt will perform bathing with assistance level/cueing: Independent with assistive device    Problem: RH Dressing Goal: LTG Patient will perform lower body dressing w/assist (OT) Description: LTG: Patient will perform lower body dressing with assist, with/without cues in positioning using equipment (OT) Flowsheets (Taken 02/22/2023 1241) LTG: Pt will perform lower body dressing with assistance level of: Independent with assistive device   Problem: RH Toileting Goal: LTG Patient will perform toileting task (3/3 steps) with assistance level (OT) Description: LTG: Patient will perform toileting task (3/3 steps) with assistance level (OT)  Flowsheets (Taken 02/22/2023 1241) LTG: Pt will perform toileting task (3/3 steps) with assistance level: Independent with assistive device   Problem: RH Functional Use of Upper Extremity Goal: LTG Patient will use RT/LT upper extremity as a (OT) Description: LTG: Patient will use right/left upper extremity as a stabilizer/gross assist/diminished/nondominant/dominant level with assist, with/without cues during functional activity (OT) Flowsheets (Taken 02/22/2023 1241) LTG: Use of upper extremity in functional activities: LUE as nondominant level LTG: Pt will use upper extremity in functional activity with assistance level of: Independent with assistive device   Problem: RH Toilet Transfers Goal: LTG Patient will perform toilet  transfers w/assist (OT) Description: LTG: Patient will perform toilet transfers with assist, with/without cues using equipment (OT) Flowsheets (Taken 02/22/2023 1241) LTG: Pt will perform toilet transfers with assistance level of: Independent with assistive device   Problem: RH Tub/Shower Transfers Goal: LTG Patient will perform tub/shower transfers w/assist (OT) Description: LTG: Patient will perform tub/shower transfers with assist, with/without cues using equipment (OT) Flowsheets (Taken 02/22/2023 1241) LTG: Pt will perform tub/shower stall transfers with assistance level of: Independent with assistive device

## 2023-02-22 NOTE — Plan of Care (Signed)
  Problem: RH Problem Solving Goal: LTG Patient will demonstrate problem solving for (SLP) Description: LTG:  Patient will demonstrate problem solving for basic/complex daily situations with cues  (SLP) Flowsheets (Taken 02/22/2023 1609) LTG: Patient will demonstrate problem solving for (SLP): Complex daily situations LTG Patient will demonstrate problem solving for: Minimal Assistance - Patient > 75%   Problem: RH Memory Goal: LTG Patient will use memory compensatory aids to (SLP) Description: LTG:  Patient will use memory compensatory aids to recall biographical/new, daily complex information with cues (SLP) Flowsheets (Taken 02/22/2023 1609) LTG: Patient will use memory compensatory aids to (SLP): Minimal Assistance - Patient > 75%   Problem: RH Comprehension Communication Goal: LTG Patient will comprehend basic/complex auditory (SLP) Description: LTG: Patient will comprehend basic/complex auditory information with cues (SLP). Flowsheets (Taken 02/22/2023 1609) LTG: Patient will comprehend auditory information with cueing (SLP): Minimal Assistance - Patient > 75%

## 2023-02-22 NOTE — Plan of Care (Signed)
  Problem: RH Balance Goal: LTG Patient will maintain dynamic standing balance (PT) Description: LTG:  Patient will maintain dynamic standing balance with assistance during mobility activities (PT) Flowsheets (Taken 02/22/2023 1323) LTG: Pt will maintain dynamic standing balance during mobility activities with:: Independent with assistive device    Problem: Sit to Stand Goal: LTG:  Patient will perform sit to stand with assistance level (PT) Description: LTG:  Patient will perform sit to stand with assistance level (PT) Flowsheets (Taken 02/22/2023 1323) LTG: PT will perform sit to stand in preparation for functional mobility with assistance level: Independent with assistive device   Problem: RH Bed Mobility Goal: LTG Patient will perform bed mobility with assist (PT) Description: LTG: Patient will perform bed mobility with assistance, with/without cues (PT). Flowsheets (Taken 02/22/2023 1323) LTG: Pt will perform bed mobility with assistance level of: Independent   Problem: RH Bed to Chair Transfers Goal: LTG Patient will perform bed/chair transfers w/assist (PT) Description: LTG: Patient will perform bed to chair transfers with assistance (PT). Flowsheets (Taken 02/22/2023 1323) LTG: Pt will perform Bed to Chair Transfers with assistance level: Independent with assistive device    Problem: RH Car Transfers Goal: LTG Patient will perform car transfers with assist (PT) Description: LTG: Patient will perform car transfers with assistance (PT). Flowsheets (Taken 02/22/2023 1323) LTG: Pt will perform car transfers with assist:: Set up assist    Problem: RH Furniture Transfers Goal: LTG Patient will perform furniture transfers w/assist (OT/PT) Description: LTG: Patient will perform furniture transfers  with assistance (OT/PT). Flowsheets (Taken 02/22/2023 1323) LTG: Pt will perform furniture transfers with assist:: Independent with assistive device    Problem: RH Ambulation Goal: LTG Patient  will ambulate in home environment (PT) Description: LTG: Patient will ambulate in home environment, # of feet with assistance (PT). Flowsheets (Taken 02/22/2023 1323) LTG: Pt will ambulate in home environ  assist needed:: Independent with assistive device LTG: Ambulation distance in home environment: up tp 50' per bout using LRAD Goal: LTG Patient will ambulate in community environment (PT) Description: LTG: Patient will ambulate in community environment, # of feet with assistance (PT). Flowsheets (Taken 02/22/2023 1323) LTG: Pt will ambulate in community environ  assist needed:: Supervision/Verbal cueing LTG: Ambulation distance in community environment: >400 ft using LRAD as necessary   Problem: RH Stairs Goal: LTG Patient will ambulate up and down stairs w/assist (PT) Description: LTG: Patient will ambulate up and down # of stairs with assistance (PT) Flowsheets (Taken 02/22/2023 1323) LTG: Pt will ambulate up/down stairs assist needed:: Independent with assistive device LTG: Pt will  ambulate up and down number of stairs: at least 4 steps using HR as provided in order to utilize public transportation or facility transportation and mobilize in community

## 2023-02-22 NOTE — Progress Notes (Signed)
Received patient in bed to unit.  Alert and oriented.  Informed consent signed and in chart.   TX duration:3:45  Patient tolerated well.  Transported back to the room  Alert, without acute distress.  Hand-off given to patient's nurse.   Access used: left AVF Access issues: none  Total UF removed: 3L Medication(s) given: none   02/22/23 1819  Vitals  Temp 97.9 F (36.6 C)  Temp Source Oral  BP (!) 197/101  MAP (mmHg) 130  BP Location Right Arm  BP Method Automatic  Patient Position (if appropriate) Lying  Pulse Rate 83  Pulse Rate Source Monitor  ECG Heart Rate 84  Resp 13  Oxygen Therapy  SpO2 97 %  O2 Device Room Air  During Treatment Monitoring  HD Safety Checks Performed Yes  Intra-Hemodialysis Comments Tx completed;Tolerated well  Dialysis Fluid Bolus Normal Saline  Bolus Amount (mL) 300 mL      Tanner Rollins [name]

## 2023-02-22 NOTE — Progress Notes (Signed)
[facility] Progress Note   Subjective: Seen in room. Tired after therapy. No new complaints. For dialysis today.   Objective Vitals:   02/21/23 1524 02/21/23 1949 02/22/23 0500 02/22/23 0511  BP: (!) 186/95 (!) 192/91  (!) 170/78  Pulse: 75 80  63  Resp: 16 18  18   Temp: (!) 97.3 F (36.3 C) 98.6 F (37 C)  98.5 F (36.9 C)  TempSrc: Oral Oral  Oral  SpO2: 98% 99%  97%  Weight: 73.8 kg  73.9 kg   Height: 6' (1.829 m)        Additional Objective Labs: Basic Metabolic Panel: Recent Labs  Lab 02/20/23 0153 02/21/23 0533 02/22/23 1028  NA 135 134* 135  K 4.8 4.7 5.1  CL 93* 93* 94*  CO2 22 25 26   GLUCOSE 77 68* 94  BUN 54* 34* 47*  CREATININE 16.03* 10.93* 13.87*  CALCIUM 9.2 9.4 9.5  PHOS 7.5* 5.3* 5.5*   CBC: Recent Labs  Lab 02/18/23 0900 02/18/23 0903 02/19/23 0434 02/20/23 0153 02/21/23 0533 02/22/23 1028  WBC 5.6  --  5.2 6.7 6.4 5.6  NEUTROABS 3.7  --   --   --   --   --   HGB 11.1*   < > 11.4* 10.5* 11.3* 11.4*  HCT 34.6*   < > 34.2* 30.5* 33.9* 34.4*  MCV 98.0  --  96.6 91.0 96.9 96.4  PLT 196  --  210 206 218 228   < > = values in this interval not displayed.   Blood Culture    Component Value Date/Time   SDES PLEURAL 02/12/2022 1212   SDES PLEURAL 02/12/2022 1212   SPECREQUEST LEFT LUNG 02/12/2022 1212   SPECREQUEST LEFT LUNG 02/12/2022 1212   CULT  02/12/2022 1212    NO GROWTH 5 DAYS Performed at [facility], [address], [place], [place] [address]    REPTSTATUS 02/12/2022 FINAL 02/12/2022 1212   REPTSTATUS 02/17/2022 FINAL 02/12/2022 1212     Physical Exam General: Alert, nad, room air Heart: RRR Lungs: Clear anteriorly  Abdomen: soft  Extremities: no LE edema  Dialysis Access: LUE AVF +bruit   Medications:  anticoagulant sodium citrate      (feeding supplement) PROSource Plus  30 mL Oral BID BM   amiodarone  200 mg Oral Daily   apixaban  5 mg Oral BID   atorvastatin  40 mg Oral Daily    Chlorhexidine Gluconate Cloth  6 each Topical Daily   levETIRAcetam  250 mg Oral Once per day on Tue Thu Sat   levETIRAcetam  500 mg Oral BID   sevelamer carbonate  1,600 mg Oral TID WC    Dialysis Orders: TTS at [facility] 4hr, 400/800, EDW 70kg, 2K/2Ca, AVF, 15g needles, heparin 3000 bolus - no recent ESA, Hgb typically > 11 - no VDRA at the moment, prev on Hectoral - held as of 6/29 for Ca 10.2   Assessment/Plan: Acute R CVA (2 areas): Neuro following, started on Keppra.On Eliquis + HL. Appears to be back to baseline now. Being considered for CIR. ESRD:  Usual TTS schedule - missed last HD, tolerated HD on Thur with 1.5L UF. No heparin, low UFG. HD today  Hypertension/volume: BP high - permissive HTN. No edema. Goal to keep SBP>105  Anemia: Hgb  11.4  no ESA for now Metabolic bone disease: Ca ok, Phos high - resume sevelamer as binder. Nutrition:  Alb low, continue supps. pA-Fib: On Eliquis + amiodarone  Tomasa Blase PA-C [facility] 02/22/2023,12:32 PM

## 2023-02-22 NOTE — Evaluation (Signed)
Speech Language Pathology Assessment and Plan  Patient Details  Name: Tanner Rollins MRN: [id number] Date of Birth: [birth year]  SLP Diagnosis: Cognitive Impairments  Rehab Potential: Good ELOS: 10-12 days    Today's Date: 02/22/2023 SLP Individual Time: 0100-0158 SLP Individual Time Calculation (min): 58 min   Hospital Problem: Principal Problem:   CVA (cerebral vascular accident) [facility])  Past Medical History:  Past Medical History:  Diagnosis Date   ESRD on hemodialysis (HCC)    [facility]   Hypertension    PAF (paroxysmal atrial fibrillation) (HCC)    Seizure disorder (HCC)    Past Surgical History:  Past Surgical History:  Procedure Laterality Date   ANGIOPLASTY Left 06/10/2022   Procedure: Balloon VENOPLASTY;  Surgeon: Chuck Hint, MD;  Location: [facility] OR;  Service: Vascular;  Laterality: Left;   AV FISTULA PLACEMENT Left 01/28/2022   Procedure: ARTERIOVENOUS (AV) FISTULA CREATION VS.GRAFT ARM;  Surgeon: Chuck Hint, MD;  Location: [facility] OR;  Service: Vascular;  Laterality: Left;   FISTULOGRAM Left 06/10/2022   Procedure: FISTULOGRAM;  Surgeon: Chuck Hint, MD;  Location: [facility] OR;  Service: Vascular;  Laterality: Left;   IR FLUORO GUIDE CV LINE RIGHT  01/24/2022   IR REMOVAL TUN CV CATH W/O FL  10/04/2022   IR THORACENTESIS ASP PLEURAL SPACE W/IMG GUIDE  02/12/2022   IR US GUIDE VASC ACCESS RIGHT  01/24/2022    Assessment / Plan / Recommendation Clinical Impression Pt is a 72 year old male with history of HTN, ESRD- HD MWF, BPH with elevated PSA, A Fib, and medication non-compliance who was admitted on 02/18/23 with left sided weakness with decreased sensation, was disoriented and noted to have intermittent LUE flaccidity with intermittent right gaze deviation. He had seizure enroute to [facility] ED treated with IV ativan. Per H&P 02/21/23, MRI revealed "two acute infarcts in right corona radiata, parenchymal atrophy with chronic small vessel disease and  scattered chronic micohemorrhages". Pt admitted to CIR on 02/21/23.   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 He enjoys playing golf. He lives alone and manages his medications independently. His sister checks on him daily and provides some assistance with cooking meals. He managed his own appointments & finances and was driving up until admission. He has a son and daughter who live nearby.  Speech intelligibility was 80% in a quiet one-on-one environment.  Portions of the CLQT and an informal screening test were administered to assess the pt's cognitive linguistic skills. Pt had WNL executive function, attention, and language skills. He had deficits in [facility] and working memory as noted by difficulty with delayed recall of words after 5 minutes (recalled 1/5), 10/18 Subscore 1 on Story Retelling, and had trouble following complex 3-step directions. He benefited from semantic cues to recall remaining words. He generatively named 17 animals and 3 words beginning with 'm' when given 1 minute time constraints. Answered complex y/n questions with 100% acc. Some mild deficits in verbal problem solving related to everyday math noted. 100% acc for abstract thinking.   Overall, pt presents with mild-mod cognitive communication deficits in STM, working memory, and verbal problem solving. Pt would benefit from skilled ST to target compensatory memory strategies and problem solving related to everyday math.     Skilled Therapeutic Interventions          Administered a cognitive communication evaluation; please see above.  SLP Assessment  Patient will need skilled Speech Lanaguage Pathology Services during CIR admission    Recommendations  Compensations: Small sips/bites;Slow rate Postural Changes and/or Swallow Maneuvers: Seated upright 90 degrees Oral Care Recommendations: Oral care BID Patient destination:  Home Equipment Recommended: None recommended by SLP    SLP Frequency 1 to 3 out of 7 days   SLP Duration  SLP Intensity  SLP Treatment/Interventions 10-12 days  Minumum of 1-2 x/day, 30 to 90 minutes  Cognitive remediation/compensation;Internal/external aids;Therapeutic Activities;Therapeutic Exercise;Functional tasks;Patient/family education    Pain Pain Assessment Pain Scale: 0-10 Pain Score: 0-No pain  Prior Functioning Cognitive/Linguistic Baseline: Within functional limits Type of Home: Apartment  Lives With: Alone Available Help at Discharge: Family;Available 24 hours/day Vocation: Retired  Architectural technologist Overall Cognitive Status: Impaired/Different from baseline Arousal/Alertness: Awake/alert Orientation Level: Oriented X4 Year: 2024 Month: July Day of Week: Incorrect Memory: Impaired Memory Impairment: Decreased recall of new information;Retrieval deficit Awareness: Appears intact Awareness Impairment: Intellectual impairment Problem Solving: Impaired Problem Solving Impairment: Verbal complex Safety/Judgment: Appears intact  Comprehension Auditory Comprehension Overall Auditory Comprehension: Appears within functional limits for tasks assessed Yes/No Questions: Within Functional Limits Commands: Impaired Complex Commands: 50-74% accurate EffectiveTechniques: Repetition;Extra processing time Visual Recognition/Discrimination Discrimination: Within Function Limits Reading Comprehension Reading Status: Within funtional limits Expression Expression Primary Mode of Expression: Verbal Verbal Expression Overall Verbal Expression: Appears within functional limits for tasks assessed Initiation: No impairment Automatic Speech: Name;Day of week;Month of year Level of Generative/Spontaneous Verbalization: Conversation Repetition: No impairment Naming: No impairment Pragmatics: No impairment Effective Techniques: Semantic cues Written  Expression Dominant Hand: Right Oral Motor Oral Motor/Sensory Function Overall Oral Motor/Sensory Function: Within functional limits Motor Speech Overall Motor Speech: Appears within functional limits for tasks assessed  Care Tool Care Tool Cognition Ability to hear (with hearing aid or hearing appliances if normally used Ability to hear (with hearing aid or hearing appliances if normally used): 0. Adequate - no difficulty in normal conservation, social interaction, listening to TV   Expression of Ideas and Wants Expression of Ideas and Wants: 4. Without difficulty (complex and basic) - expresses complex messages without difficulty and with speech that is clear and easy to understand   Understanding Verbal and Non-Verbal Content Understanding Verbal and Non-Verbal Content: 4. Understands (complex and basic) - clear comprehension without cues or repetitions  Memory/Recall Ability Memory/Recall Ability : That he or she is in a hospital/hospital unit;Current season     Short Term Goals: Week 1: SLP Short Term Goal 1 (Week 1): Pt will use compensatory memory strategies to recall functional information in his current environment with 80% acc when provided min verbal cues. SLP Short Term Goal 2 (Week 1): Pt will complete verbal problem solving related to finance management/everyday math with 80% acc when provided min verbal cues. SLP Short Term Goal 3 (Week 1): Using strategies, pt will follow complex 3-step directions with 80% acc when provided min verbal cues.  Refer to Care Plan for Long Term Goals  Recommendations for other services: None   Discharge Criteria: Patient will be discharged from SLP if patient refuses treatment 3 consecutive times without medical reason, if treatment goals not met, if there is a change in medical status, if patient makes no progress towards goals or if patient is discharged from hospital.  The above assessment, treatment plan, treatment alternatives and goals  were discussed and mutually agreed upon: by patient  Alphonsus Sias 02/22/2023, 3:45 PM

## 2023-02-23 ENCOUNTER — Inpatient Hospital Stay (HOSPITAL_COMMUNITY): Payer: No Typology Code available for payment source

## 2023-02-23 DIAGNOSIS — I639 Cerebral infarction, unspecified: Secondary | ICD-10-CM | POA: Diagnosis not present

## 2023-02-23 DIAGNOSIS — G4700 Insomnia, unspecified: Secondary | ICD-10-CM | POA: Diagnosis not present

## 2023-02-23 DIAGNOSIS — N186 End stage renal disease: Secondary | ICD-10-CM | POA: Diagnosis not present

## 2023-02-23 DIAGNOSIS — M25552 Pain in left hip: Secondary | ICD-10-CM

## 2023-02-23 MED ORDER — MELATONIN 5 MG PO TABS
5.0000 mg | ORAL_TABLET | Freq: Every day | ORAL | Status: DC
  Administered 2023-02-23 – 2023-03-01 (×7): 5 mg via ORAL
  Filled 2023-02-23 (×7): qty 1

## 2023-02-23 NOTE — Progress Notes (Signed)
PROGRESS NOTE   Subjective/Complaints:  Slept poorly last night despite the 50mg  trazodone dose. Agreeable with melatonin option as well.   Denies pain.  LBM yesterday per patient. Not documented once again.  Made a little urine yesterday, got dialysis yesterday.  Reports still having some soreness on the L hip area, over the greater trochanteric bursa-- from the fall he sustained the day he came to the ER. No xrays were done at that time.  Denies any other complaints or concerns at this time.   ROS: as per HPI. Denies CP, SOB, abd pain, N/V/D/C, or any other complaints at this time.    Objective:   No results found. Recent Labs    02/21/23 0533 02/22/23 1028  WBC 6.4 5.6  HGB 11.3* 11.4*  HCT 33.9* 34.4*  PLT 218 228   Recent Labs    02/21/23 0533 02/22/23 1028  NA 134* 135  K 4.7 5.1  CL 93* 94*  CO2 25 26  GLUCOSE 68* 94  BUN 34* 47*  CREATININE 10.93* 13.87*  CALCIUM 9.4 9.5    Intake/Output Summary (Last 24 hours) at 02/23/2023 1119 Last data filed at 02/23/2023 7425 Gross per 24 hour  Intake 180 ml  Output 2000 ml  Net -1820 ml        Physical Exam: Vital Signs Blood pressure (!) 197/93, pulse 75, temperature 98.3 F (36.8 C), temperature source Oral, resp. rate 18, height 6' (1.829 m), weight 71.3 kg, SpO2 97 %.  Constitutional: No distress . Vital signs reviewed. Resting in bed.  HEENT: NCAT, EOMI, oral membranes moist Neck: supple Cardiovascular: RRR without murmur. No JVD. Bruit LUE AVF Respiratory/Chest: CTA Bilaterally without wheezes or rales. Normal effort    GI/Abdomen: BS +, non-tender, non-distended Ext: no clubbing, cyanosis, or edema Psych: pleasant and cooperative Skin: dry, warm. Healed lesion on L forearm.  MsK: moves extremities antigravity but full msk testing not done. L hip with mild TTP to greater trochanteric bursa area, no crepitus or deformity, able to do ROM without much  discomfort.   PRIOR EXAMS: Neuro: Alert and oriented x 3. Mild left facial weakness with mild to moderate dysarthria (although pt feels that he's close to baseline from speech standpoint). Demonstrates fair insight and awareness. Follows all basic commands. Motor: RUE 4+/5 prox to distal. LUE 4- to 4/5 prox to distal. RLE 4+/5. RLE 4/5 prox to distal. Sensory exam normal for light touch and pain RUE and RLE. May be slightly diminished LUE and LLE. No limb ataxia or cerebellar signs. No abnormal tone appreciated   Assessment/Plan: 1. Functional deficits which require 3+ hours per day of interdisciplinary therapy in a comprehensive inpatient rehab setting. Physiatrist is providing close team supervision and 24 hour management of active medical problems listed below. Physiatrist and rehab team continue to assess barriers to discharge/monitor patient progress toward functional and medical goals  Care Tool:  Bathing    Body parts bathed by patient: Right arm, Right lower leg, Left lower leg, Left arm, Chest, Face, Abdomen, Front perineal area, Buttocks, Right upper leg, Left upper leg         Bathing assist Assist Level: Contact Guard/Touching assist  Upper Body Dressing/Undressing Upper body dressing   What is the patient wearing?: Pull over shirt    Upper body assist Assist Level: Set up assist    Lower Body Dressing/Undressing Lower body dressing      What is the patient wearing?: Pants     Lower body assist Assist for lower body dressing: Contact Guard/Touching assist     Toileting Toileting Toileting Activity did not occur (Clothing management and hygiene only): N/A (no void or bm)  Toileting assist       Transfers Chair/bed transfer  Transfers assist     Chair/bed transfer assist level: Contact Guard/Touching assist     Locomotion Ambulation   Ambulation assist      Assist level: Minimal Assistance - Patient > 75% Assistive device: No Device Max  distance: 250 ft   Walk 10 feet activity   Assist     Assist level: Contact Guard/Touching assist Assistive device: No Device   Walk 50 feet activity   Assist    Assist level: Minimal Assistance - Patient > 75% Assistive device: No Device    Walk 150 feet activity   Assist    Assist level: Minimal Assistance - Patient > 75% Assistive device: No Device    Walk 10 feet on uneven surface  activity   Assist Walk 10 feet on uneven surfaces activity did not occur: Safety/medical concerns         Wheelchair     Assist Is the patient using a wheelchair?: No   Wheelchair activity did not occur: N/A         Wheelchair 50 feet with 2 turns activity    Assist    Wheelchair 50 feet with 2 turns activity did not occur: N/A       Wheelchair 150 feet activity     Assist  Wheelchair 150 feet activity did not occur: N/A       Blood pressure (!) 197/93, pulse 75, temperature 98.3 F (36.8 C), temperature source Oral, resp. rate 18, height 6' (1.829 m), weight 71.3 kg, SpO2 97 %.  Medical Problem List and Plan: 1. 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  Antithrombotics: -DVT/anticoagulation:  Pharmaceutical: Eliquis 5mg  BID             -antiplatelet therapy: N/a 3. Pain Management: tylenol prn.  4. Mood/Behavior/Sleep: LCSW to follow for evaluation and support.              --trazodone prn added for insomnia. Used tylenol PM occasionally at home.    -02/22/23 reminded pt about PRN trazodone, will try this tonight -02/23/23 trazodone didn't help much; will schedule melatonin 5mg  QHS, leave PRN trazodone as well             -antipsychotic agents: N/A 5. Neuropsych/cognition: This patient is capable of making decisions on his  own behalf. 6. Skin/Wound Care: routine pressure relief measures.  7.  Fluids/Electrolytes/Nutrition:  Strict I/O. 1200 cc FR/day 8. HTN: Monitor BP TID--permissive HTN for next 1-2 days (~02/24/23).  --continue to HOLD diltiazem 180mg  QD, coreg (?), clonidine 0.1mg  BID.    -02/22/23 BP elevated but <220/110, continue to allow for permissive HTN -02/23/23 BPs still elevated but under permissive HTN goal, will likely need to start adding back  BP meds tomorrow.  Vitals:   02/22/23 1430 02/22/23 1500 02/22/23 1530 02/22/23 1602  BP: (!) 183/87 (!) 199/93 (!) 190/95 (!) 181/100   02/22/23 1630 02/22/23 1700 02/22/23 1730 02/22/23 1800  BP: (!) 199/98 (!) 176/128 (!) 198/109 (!) 210/108   02/22/23 1819 02/22/23 1827 02/22/23 2030 02/23/23 0358  BP: (!) 197/101 (!) 190/102 (!) 190/103 (!) 197/93    9. A fib: Monitor HR TID--on amiodarone 200mg  QD and Eliquis.   -7/6-7/24 HR controlled, monitor    02/23/2023    4:47 AM 02/23/2023    3:58 AM 02/22/2023    8:30 PM  Vitals with BMI  Weight 157 lbs 3 oz    BMI 21.31    Systolic  197 190  Diastolic  93 103  Pulse  75 76    10 ESRD: Schedule HD at the end of the day on TTS. 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  11. Seizures hx: Change Keppra to po with 500mg  BID and additional 250 mg on TTS after HD 12. Anemia of chronic disease: Stable. Serial CBC with HD.  13.  Dyslipidemia: HDL-29  [id number]. On Lipitor 40mg  QD. 14. L hip pain: -02/23/23 pt states that he fell on his L hip during the event that resulted in him going to the hospital-- no xrays done. TTP over the greater trochanteric bursa; suspect bursitis from impact, but will obtain Xray of L hip--pending. Kpad ordered.     LOS: 2 days A FACE TO FACE EVALUATION WAS PERFORMED  [address] 02/23/2023, 11:19 AM

## 2023-02-23 NOTE — Progress Notes (Signed)
  [facility] Progress Note   Dialysis Orders: TTS at [facility] 4hr, 400/800, EDW 70kg, 2K/2Ca, AVF, 15g needles, heparin 3000 bolus - no recent ESA, Hgb typically > 11 - no VDRA at the moment, prev on Hectoral - held as of 6/29 for Ca 10.2   Assessment/Plan: Acute R CVA (2 areas): Neuro following, started on Keppra.On Eliquis + HL. Appears to be back to baseline now. CIR. ESRD:  Usual TTS schedule - missed last HD, tolerated HD on Thur with 1.5L UF and 7/6 3L w/o cramping. No heparin, low UFG. Next HD on Tues. Hypertension/volume: BP high - permissive HTN. No edema. Goal to keep SBP>105  Anemia: Hgb  11.4  no ESA for now Metabolic bone disease: Ca ok, Phos high - resume sevelamer as binder. Nutrition:  Alb low, continue supps. pA-Fib: On Eliquis + amiodarone   Subjective: Seen in room. No new complaints. Tolerated HD on 7/6 w/ no cramping.    Objective Vitals:   02/22/23 1828 02/22/23 2030 02/23/23 0358 02/23/23 0447  BP:  (!) 190/103 (!) 197/93   Pulse:  76 75   Resp:  17 18   Temp:  98.5 F (36.9 C) 98.3 F (36.8 C)   TempSrc:  Oral Oral   SpO2:  99% 97%   Weight: 71.5 kg   71.3 kg  Height:         Additional Objective Labs: Basic Metabolic Panel: Recent Labs  Lab 02/20/23 0153 02/21/23 0533 02/22/23 1028  NA 135 134* 135  K 4.8 4.7 5.1  CL 93* 93* 94*  CO2 22 25 26   GLUCOSE 77 68* 94  BUN 54* 34* 47*  CREATININE 16.03* 10.93* 13.87*  CALCIUM 9.2 9.4 9.5  PHOS 7.5* 5.3* 5.5*   CBC: Recent Labs  Lab 02/18/23 0900 02/18/23 0903 02/19/23 0434 02/20/23 0153 02/21/23 0533 02/22/23 1028  WBC 5.6  --  5.2 6.7 6.4 5.6  NEUTROABS 3.7  --   --   --   --   --   HGB 11.1*   < > 11.4* 10.5* 11.3* 11.4*  HCT 34.6*   < > 34.2* 30.5* 33.9* 34.4*  MCV 98.0  --  96.6 91.0 96.9 96.4  PLT 196  --  210 206 218 228   < > = values in this interval not displayed.   Blood Culture    Component Value Date/Time   SDES PLEURAL 02/12/2022 1212   SDES PLEURAL  02/12/2022 1212   SPECREQUEST LEFT LUNG 02/12/2022 1212   SPECREQUEST LEFT LUNG 02/12/2022 1212   CULT  02/12/2022 1212    NO GROWTH 5 DAYS Performed at [facility], [address] [place], [place] [address]    REPTSTATUS 02/12/2022 FINAL 02/12/2022 1212   REPTSTATUS 02/17/2022 FINAL 02/12/2022 1212     Physical Exam General: Alert, nad, room air Heart: RRR Lungs: Clear anteriorly  Abdomen: soft  Extremities: no LE edema  Dialysis Access: LUE AVF +bruit   Medications:    (feeding supplement) PROSource Plus  30 mL Oral BID BM   amiodarone  200 mg Oral Daily   apixaban  5 mg Oral BID   atorvastatin  40 mg Oral Daily   Chlorhexidine Gluconate Cloth  6 each Topical Daily   levETIRAcetam  250 mg Oral Once per day on Tue Thu Sat   levETIRAcetam  500 mg Oral Daily   sevelamer carbonate  1,600 mg Oral TID WC

## 2023-02-24 DIAGNOSIS — E785 Hyperlipidemia, unspecified: Secondary | ICD-10-CM | POA: Diagnosis not present

## 2023-02-24 DIAGNOSIS — D631 Anemia in chronic kidney disease: Secondary | ICD-10-CM | POA: Diagnosis not present

## 2023-02-24 DIAGNOSIS — M25552 Pain in left hip: Secondary | ICD-10-CM | POA: Diagnosis not present

## 2023-02-24 DIAGNOSIS — N186 End stage renal disease: Secondary | ICD-10-CM | POA: Diagnosis not present

## 2023-02-24 NOTE — Progress Notes (Signed)
Inpatient Rehabilitation Care Coordinator Assessment and Plan Patient Details  Name: Tanner Rollins MRN: [id number] Date of Birth: November 27, 1950  Today's Date: 02/24/2023  Hospital Problems: Principal Problem:   CVA (cerebral vascular accident) [facility])  Past Medical History:  Past Medical History:  Diagnosis Date   ESRD on hemodialysis (HCC)    [facility]   Hypertension    PAF (paroxysmal atrial fibrillation) (HCC)    Seizure disorder [facility])    Past Surgical History:  Past Surgical History:  Procedure Laterality Date   ANGIOPLASTY Left 06/10/2022   Procedure: Balloon VENOPLASTY;  Surgeon: Chuck Hint, MD;  Location: [facility] OR;  Service: Vascular;  Laterality: Left;   AV FISTULA PLACEMENT Left 01/28/2022   Procedure: ARTERIOVENOUS (AV) FISTULA CREATION VS.GRAFT ARM;  Surgeon: Chuck Hint, MD;  Location: [facility] OR;  Service: Vascular;  Laterality: Left;   FISTULOGRAM Left 06/10/2022   Procedure: FISTULOGRAM;  Surgeon: Chuck Hint, MD;  Location: [facility] OR;  Service: Vascular;  Laterality: Left;   IR FLUORO GUIDE CV LINE RIGHT  01/24/2022   IR REMOVAL TUN CV CATH W/O FL  10/04/2022   IR THORACENTESIS ASP PLEURAL SPACE W/IMG GUIDE  02/12/2022   IR US GUIDE VASC ACCESS RIGHT  01/24/2022   Social History:  reports that he quit smoking about 33 years ago. His smoking use included cigarettes. He has never used smokeless tobacco. He reports that he does not drink alcohol and does not use drugs.  Family / Support Systems Marital Status: Single Patient Roles: Parent, Partner, Other (Comment) (sibling) Spouse/Significant Other: Nancy Children: two children who are supportive Other Supports: Arcelia Jew [phone] Anticipated Caregiver: lavone checks on pt three times per day and children check along with girlfriend. He will have multiple people checking on him throughout the day Ability/Limitations of Caregiver: Will not have 24/7 care Caregiver Availability:  Intermittent Family Dynamics: Close with girlfriend, sister and children. He feels he has good supports and they will make sure he has what he needs at home.  Social History Preferred language: English Religion: Patient Refused Cultural Background: No issues Education: HS Health Literacy - How often do you need to have someone help you when you read instructions, pamphlets, or other written material from your doctor or pharmacy?: Never Writes: Yes Employment Status: Retired Marine scientist Issues: No issues Guardian/Conservator: None-according to MD pt is capable of making his own decisions while here   Abuse/Neglect Abuse/Neglect Assessment Can Be Completed: Yes Physical Abuse: Denies Verbal Abuse: Denies Sexual Abuse: Denies Exploitation of patient/patient's resources: Denies Self-Neglect: Denies  Patient response to: Social Isolation - How often do you feel lonely or isolated from those around you?: Never  Emotional Status Pt's affect, behavior and adjustment status: Pt is motivated to recover and get back to his independent level he was at prior to admission. He reports his hip hurts worse than the stroke due to his fall prior to ambulance getting to him.  He is recovering and this is encouraging to him Recent Psychosocial Issues: other health issues-ESRD, HX TIA, etc Psychiatric History: No history seems to be coping appropriately and able to express himself and issues regarding his stroke Substance Abuse History: No issues  Patient / Family Perceptions, Expectations & Goals Pt/Family understanding of illness & functional limitations: Pt is able to explain his health issues and feels he is recovering and regaining his independence. He is moving around without a assistive device. He does talk with the MD and feels he is aware of his plan  moving forward. Premorbid pt/family roles/activities: father, brother, boyfriend, veteran, retiree, dialysis pt, etc Anticipated  changes in roles/activities/participation: resume Pt/family expectations/goals: Pt states: " I hope to be able to do for myself I feel I am getting there."  [employer]: Other (Comment) (OP-HD [facility] T TH Sat) Premorbid Home Care/DME Agencies: Other (Comment) (wheelchair, rw, cane, grab bars CPAP) Transportation available at discharge: self will now rely upon others Is the patient able to respond to transportation needs?: Yes In the past 12 months, has lack of transportation kept you from medical appointments or from getting medications?: No In the past 12 months, has lack of transportation kept you from meetings, work, or from getting things needed for daily living?: No  Discharge Planning Living Arrangements: Alone Support Systems: Spouse/significant other, Children, Other relatives, Friends/neighbors Type of Residence: Private residence Insurance Resources: Media planner (specify) ([place]) Financial Resources: Restaurant manager, fast food Screen Referred: No Living Expenses: Rent Money Management: Patient Does the patient have any problems obtaining your medications?: No Home Management: self Patient/Family Preliminary Plans: Return home alone with family and girlfriend coming in to check on him multiple times per day. He feels he is doing well and recovering he may not need to be here long due to his quick recovery. Care Coordinator Anticipated Follow Up Needs: HH/OP  Clinical Impression Pleasant gentleman who is motivated and doing well he seems to be recovering form his stroke and is not using a assistive device when ambulating. Will work on discharge needs and await recommendations. Appears to be coping appropriately with hospitalization  Lucy Chris 02/24/2023, 10:39 AM

## 2023-02-24 NOTE — IPOC Note (Signed)
Overall Plan of Care [facility]) Patient Details Name: Tanner Rollins MRN: [id number] DOB: 17-Aug-1951  Admitting Diagnosis: CVA (cerebral vascular accident) [facility])  Hospital Problems: Principal Problem:   CVA (cerebral vascular accident) [facility])     Functional Problem List: Nursing Bowel, Endurance, Medication Management, Safety  PT Balance, Endurance, Motor, Safety, Perception  OT Balance, Endurance  SLP Cognition  TR         Basic ADL's: OT Bathing, Dressing, Toileting     Advanced  ADL's: OT       Transfers: PT Bed Mobility, Bed to Chair, Car, Occupational psychologist, Research scientist (life sciences): PT Ambulation, Stairs     Additional Impairments: OT Fuctional Use of Upper Extremity  SLP        TR      Anticipated Outcomes Item Anticipated Outcome  Self Feeding    Swallowing      Basic self-care  Mod I  Toileting  Mod I   Bathroom Transfers Mod I  Bowel/Bladder  manage bowel w mod I assist  Transfers  Mod I  Locomotion  Mod I/ supervision  Communication     Cognition  Min A  Pain  n/a  Safety/Judgment  manage w cues   Therapy Plan: PT Intensity: Minimum of 1-2 x/day ,45 to 90 minutes PT Frequency: 5 out of 7 days PT Duration Estimated Length of Stay: 7-10 days OT Intensity: Minimum of 1-2 x/day, 45 to 90 minutes OT Frequency: 5 out of 7 days OT Duration/Estimated Length of Stay: 7-10 days SLP Intensity: Minumum of 1-2 x/day, 30 to 90 minutes SLP Frequency: 1 to 3 out of 7 days SLP Duration/Estimated Length of Stay: 10-12 days   Team Interventions: Nursing Interventions Disease Management/Prevention, Medication Management, Discharge Planning, Patient/Family Education, Bowel Management  PT interventions Ambulation/gait training, DME/adaptive equipment instruction, Neuromuscular re-education, Psychosocial support, Stair training, UE/LE Strength taining/ROM, Balance/vestibular training, Discharge planning, Functional electrical stimulation, Skin  care/wound management, Therapeutic Activities, UE/LE Coordination activities, Visual/perceptual remediation/compensation, Therapeutic Exercise, Patient/family education, Functional mobility training, Disease management/prevention, Cognitive remediation/compensation, Pain management  OT Interventions Balance/vestibular training, Community reintegration, Discharge planning, Disease mangement/prevention, DME/adaptive equipment instruction, Functional electrical stimulation, Functional mobility training, Neuromuscular re-education, Patient/family education, Self Care/advanced ADL retraining, Therapeutic Activities, Therapeutic Exercise, UE/LE Strength taining/ROM, UE/LE Coordination activities  SLP Interventions Cognitive remediation/compensation, Internal/external aids, Therapeutic Activities, Therapeutic Exercise, Functional tasks, Patient/family education  TR Interventions    SW/CM Interventions Discharge Planning, Psychosocial Support, Patient/Family Education   Barriers to Discharge MD  Medical stability  Nursing Decreased caregiver support, Hemodialysis 1 level elevator to apt solo; S.O. to assist/sister also available  PT Lack of/limited family support, Hemodialysis, Weight, Medication compliance    OT Hemodialysis    SLP      SW       Team Discharge Planning: Destination: PT-Home ,OT- Home , SLP-Home Projected Follow-up: PT-Home health PT, Outpatient PT, OT-  Home health OT, SLP-  Projected Equipment Needs: PT-To be determined, OT- To be determined, SLP-None recommended by SLP Equipment Details: PT- , OT-  Patient/family involved in discharge planning: PT- Patient,  OT-Patient, SLP-Patient  MD ELOS: 7-10 days Medical Rehab Prognosis:  Excellent Assessment: The patient has been admitted for CIR therapies with the diagnosis of right corona radiata infarction. The team will be addressing functional mobility, strength, stamina, balance, safety, adaptive techniques and equipment,  self-care, bowel and bladder mgt, patient and caregiver education. Goals have been set at supervision. Anticipated discharge destination is home.  See Team Conference Notes for weekly updates to the plan of care

## 2023-02-24 NOTE — Progress Notes (Signed)
Inpatient Rehabilitation Center Individual Statement of Services  Patient Name:  Tanner Rollins  Date:  02/24/2023  Welcome to the Inpatient Rehabilitation Center.  Our goal is to provide you with an individualized program based on your diagnosis and situation, designed to meet your specific needs.  With this comprehensive rehabilitation program, you will be expected to participate in at least 3 hours of rehabilitation therapies Monday-Friday, with modified therapy programming on the weekends.  Your rehabilitation program will include the following services:  Physical Therapy (PT), Occupational Therapy (OT), Speech Therapy (ST), 24 hour per day rehabilitation nursing, Therapeutic Recreaction (TR), Care Coordinator, Rehabilitation Medicine, Nutrition Services, and Pharmacy Services  Weekly team conferences will be held on Wednesday to discuss your progress.  Your Inpatient Rehabilitation Care Coordinator will talk with you frequently to get your input and to update you on team discussions.  Team conferences with you and your family in attendance may also be held.  Expected length of stay: 7-10 days  Overall anticipated outcome: Independent with device  Depending on your progress and recovery, your program may change. Your Inpatient Rehabilitation Care Coordinator will coordinate services and will keep you informed of any changes. Your Inpatient Rehabilitation Care Coordinator's name and contact numbers are listed  below.  The following services may also be recommended but are not provided by the Inpatient Rehabilitation Center:  Driving Evaluations Home Health Rehabiltiation Services Outpatient Rehabilitation Services    Arrangements will be made to provide these services after discharge if needed.  Arrangements include referral to agencies that provide these services.  Your insurance has been verified to be:  [place] Your primary doctor is:  [facility]  Pertinent information will be shared  with your doctor and your insurance company.  Inpatient Rehabilitation Care Coordinator:  Dossie Der, [place] [phone] or Luna Glasgow  Information discussed with and copy given to patient by: Lucy Chris, 02/24/2023, 9:38 AM

## 2023-02-24 NOTE — Progress Notes (Signed)
PROGRESS NOTE   Subjective/Complaints: No new complaints this morning Flood in room- being addressed Appreciate pharmacy addressing dose of Keppra  ROS: as per HPI. Denies CP, SOB, abd pain, N/V/D/C, or any other complaints at this time.    Objective:   DG HIP UNILAT WITH PELVIS 2-3 VIEWS LEFT  Result Date: 02/23/2023 CLINICAL DATA:  Left hip pain EXAM: DG HIP (WITH OR WITHOUT PELVIS) 2-3V LEFT COMPARISON:  03/11/2021 FINDINGS: There is no evidence of hip fracture or dislocation. Mild bilateral hip joint space narrowing. Unchanged coarsened trabecular markings within the sacrum and bilateral iliac bones. Both SI joints are fused. Atherosclerotic vascular calcifications. IMPRESSION: Mild bilateral hip osteoarthritis. No acute findings. Electronically Signed   By: Duanne Guess D.O.   On: 02/23/2023 11:47   Recent Labs    02/22/23 1028  WBC 5.6  HGB 11.4*  HCT 34.4*  PLT 228   Recent Labs    02/22/23 1028  NA 135  K 5.1  CL 94*  CO2 26  GLUCOSE 94  BUN 47*  CREATININE 13.87*  CALCIUM 9.5    Intake/Output Summary (Last 24 hours) at 02/24/2023 1121 Last data filed at 02/23/2023 1803 Gross per 24 hour  Intake 420 ml  Output --  Net 420 ml        Physical Exam: Vital Signs Blood pressure (!) 180/100, pulse 76, temperature 98.6 F (37 C), temperature source Oral, resp. rate 16, height 6' (1.829 m), weight 72.6 kg, SpO2 96 %.  Gen: no distress, normal appearing, BMI 21.71 HEENT: oral mucosa pink and moist, NCAT Cardio: Reg rate Chest: normal effort, normal rate of breathing Abd: soft, non-distended  Ext: no clubbing, cyanosis, or edema Psych: pleasant and cooperative Skin: dry, warm. Healed lesion on L forearm.  MsK: moves extremities antigravity but full msk testing not done. L hip with mild TTP to greater trochanteric bursa area, no crepitus or deformity, able to do ROM without much discomfort.   PRIOR  EXAMS: Neuro: Alert and oriented x 3. Mild left facial weakness with mild to moderate dysarthria (although pt feels that he's close to baseline from speech standpoint). Demonstrates fair insight and awareness. Follows all basic commands. Motor: RUE 4+/5 prox to distal. LUE 4- to 4/5 prox to distal. RLE 4+/5. RLE 4/5 prox to distal. Sensory exam normal for light touch and pain RUE and RLE. May be slightly diminished LUE and LLE. No limb ataxia or cerebellar signs. No abnormal tone appreciated   Assessment/Plan: 1. Functional deficits which require 3+ hours per day of interdisciplinary therapy in a comprehensive inpatient rehab setting. Physiatrist is providing close team supervision and 24 hour management of active medical problems listed below. Physiatrist and rehab team continue to assess barriers to discharge/monitor patient progress toward functional and medical goals  Care Tool:  Bathing    Body parts bathed by patient: Right arm, Right lower leg, Left lower leg, Left arm, Chest, Face, Abdomen, Front perineal area, Buttocks, Right upper leg, Left upper leg         Bathing assist Assist Level: Contact Guard/Touching assist     Upper Body Dressing/Undressing Upper body dressing   What is the patient wearing?:  Pull over shirt    Upper body assist Assist Level: Set up assist    Lower Body Dressing/Undressing Lower body dressing      What is the patient wearing?: Pants     Lower body assist Assist for lower body dressing: Contact Guard/Touching assist     Toileting Toileting Toileting Activity did not occur (Clothing management and hygiene only): N/A (no void or bm)  Toileting assist Assist for toileting: Moderate Assistance - Patient 50 - 74%     Transfers Chair/bed transfer  Transfers assist     Chair/bed transfer assist level: Contact Guard/Touching assist     Locomotion Ambulation   Ambulation assist      Assist level: Minimal Assistance - Patient >  75% Assistive device: No Device Max distance: 250 ft   Walk 10 feet activity   Assist     Assist level: Contact Guard/Touching assist Assistive device: No Device   Walk 50 feet activity   Assist    Assist level: Minimal Assistance - Patient > 75% Assistive device: No Device    Walk 150 feet activity   Assist    Assist level: Minimal Assistance - Patient > 75% Assistive device: No Device    Walk 10 feet on uneven surface  activity   Assist Walk 10 feet on uneven surfaces activity did not occur: Safety/medical concerns         Wheelchair     Assist Is the patient using a wheelchair?: No   Wheelchair activity did not occur: N/A         Wheelchair 50 feet with 2 turns activity    Assist    Wheelchair 50 feet with 2 turns activity did not occur: N/A       Wheelchair 150 feet activity     Assist  Wheelchair 150 feet activity did not occur: N/A       Blood pressure (!) 180/100, pulse 76, temperature 98.6 F (37 C), temperature source Oral, resp. rate 16, height 6' (1.829 m), weight 72.6 kg, SpO2 96 %.  Medical Problem List and Plan: 1. 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  Antithrombotics: -DVT/anticoagulation:  Pharmaceutical: Eliquis 5mg  BID             -antiplatelet therapy: N/a 3. Pain Management: tylenol prn.  4. Mood/Behavior/Sleep: LCSW to follow for evaluation and support.              --trazodone prn added for insomnia. Used tylenol PM occasionally at home.    -02/22/23 reminded pt about PRN trazodone, will try this tonight -02/23/23 trazodone didn't help much; will schedule melatonin 5mg  QHS, leave PRN trazodone as well             -antipsychotic agents: N/A 5. Neuropsych/cognition: This patient is capable of making decisions on his  own behalf. 6. Skin/Wound Care: routine  pressure relief measures.  7. Fluids/Electrolytes/Nutrition:  Strict I/O. 1200 cc FR/day 8. HTN: Monitor BP TID--permissive HTN for next 1-2 days (~02/24/23).  --continue to HOLD diltiazem 180mg  QD, coreg (?), clonidine 0.1mg  BID.    -02/22/23 BP elevated but <220/110, continue to allow for permissive HTN -02/23/23 BPs still elevated but under permissive HTN goal, will likely need to start adding back BP meds tomorrow.  Vitals:  02/22/23 1602 02/22/23 1630 02/22/23 1700 02/22/23 1730  BP: (!) 181/100 (!) 199/98 (!) 176/128 (!) 198/109   02/22/23 1800 02/22/23 1819 02/22/23 1827 02/22/23 2030  BP: (!) 210/108 (!) 197/101 (!) 190/102 (!) 190/103   02/23/23 0358 02/23/23 1426 02/23/23 1834 02/24/23 0405  BP: (!) 197/93 (!) 176/91 (!) 190/105 (!) 180/100    9. A fib: Monitor HR TID--on amiodarone 200mg  QD and Eliquis.   -7/6-7/24 HR controlled, monitor    02/24/2023    4:05 AM 02/23/2023    6:34 PM 02/23/2023    2:26 PM  Vitals with BMI  Weight 160 lbs 1 oz    BMI 21.7    Systolic 180 190 161  Diastolic 100 105 91  Pulse 76 77 76    10 ESRD: Schedule HD at the end of the day on TTS. 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  11. Seizures hx: Change Keppra to po with 500mg  daily and additional 250 mg on TTS after HD  12. Anemia of chronic disease: Stable. Serial CBC with HD. Hgb reviewed and was stable at 11.4 on 7/6  13.  Dyslipidemia: HDL-29  [id number]. Continue Lipitor 40mg  QD.  14. L hip pain: -02/23/23 pt states that he fell on his L hip during the event that resulted in him going to the hospital-- no xrays done. TTP over the greater trochanteric bursa; suspect bursitis from impact, but will obtain Xray of L hip--pending. Kpad ordered.  7/8 XR reviewed and shows mild OA    LOS: 3 days A FACE TO FACE EVALUATION WAS PERFORMED  Shawnna Pancake P Jayvien Rowlette 02/24/2023, 11:21 AM

## 2023-02-24 NOTE — Progress Notes (Signed)
[facility] Progress Note   Subjective: Up in chair talking with therapists. No C/Os.   Objective Vitals:   02/23/23 0447 02/23/23 1426 02/23/23 1834 02/24/23 0405  BP:  (!) 176/91 (!) 190/105 (!) 180/100  Pulse:  76 77 76  Resp:  18 19 16   Temp:  98.4 F (36.9 C) 98.6 F (37 C) 98.6 F (37 C)  TempSrc:  Oral Oral Oral  SpO2:  99% 100% 96%  Weight: 71.3 kg   72.6 kg  Height:       Physical Exam General: older male in NAD Heart: S1,S2 no M/R/G Lungs: CTAB A/P Abdomen:NABS/NT Extremities:No LE edema Dialysis Access: L AVF + T/B    Additional Objective Labs: Basic Metabolic Panel: Recent Labs  Lab 02/20/23 0153 02/21/23 0533 02/22/23 1028  NA 135 134* 135  K 4.8 4.7 5.1  CL 93* 93* 94*  CO2 22 25 26   GLUCOSE 77 68* 94  BUN 54* 34* 47*  CREATININE 16.03* 10.93* 13.87*  CALCIUM 9.2 9.4 9.5  PHOS 7.5* 5.3* 5.5*   Liver Function Tests: Recent Labs  Lab 02/18/23 0900 02/19/23 0434 02/20/23 0153 02/21/23 0533 02/22/23 1028  AST 18  --   --   --   --   ALT 13  --   --   --   --   ALKPHOS 87  --   --   --   --   BILITOT 0.7  --   --   --   --   PROT 6.8  --   --   --   --   ALBUMIN 3.5   < > 3.3* 3.1* 3.2*   < > = values in this interval not displayed.   No results for input(s): "LIPASE", "AMYLASE" in the last 168 hours. CBC: Recent Labs  Lab 02/18/23 0900 02/18/23 0903 02/19/23 0434 02/20/23 0153 02/21/23 0533 02/22/23 1028  WBC 5.6  --  5.2 6.7 6.4 5.6  NEUTROABS 3.7  --   --   --   --   --   HGB 11.1*   < > 11.4* 10.5* 11.3* 11.4*  HCT 34.6*   < > 34.2* 30.5* 33.9* 34.4*  MCV 98.0  --  96.6 91.0 96.9 96.4  PLT 196  --  210 206 218 228   < > = values in this interval not displayed.   Blood Culture    Component Value Date/Time   SDES PLEURAL 02/12/2022 1212   SDES PLEURAL 02/12/2022 1212   SPECREQUEST LEFT LUNG 02/12/2022 1212   SPECREQUEST LEFT LUNG 02/12/2022 1212   CULT  02/12/2022 1212    NO GROWTH 5 DAYS Performed  at [facility], [address] [place], [place] [address]    REPTSTATUS 02/12/2022 FINAL 02/12/2022 1212   REPTSTATUS 02/17/2022 FINAL 02/12/2022 1212    Cardiac Enzymes: No results for input(s): "CKTOTAL", "CKMB", "CKMBINDEX", "TROPONINI" in the last 168 hours. CBG: Recent Labs  Lab 02/19/23 0849 02/19/23 1212 02/19/23 1640 02/19/23 2006 02/20/23 0735  GLUCAP 80 95 85 73 87   Iron Studies: No results for input(s): "IRON", "TIBC", "TRANSFERRIN", "FERRITIN" in the last 72 hours. @lablastinr3 @ Studies/Results: DG HIP UNILAT WITH PELVIS 2-3 VIEWS LEFT  Result Date: 02/23/2023 CLINICAL DATA:  Left hip pain EXAM: DG HIP (WITH OR WITHOUT PELVIS) 2-3V LEFT COMPARISON:  03/11/2021 FINDINGS: There is no evidence of hip fracture or dislocation. Mild bilateral hip joint space narrowing. Unchanged coarsened trabecular markings within the sacrum and bilateral  iliac bones. Both SI joints are fused. Atherosclerotic vascular calcifications. IMPRESSION: Mild bilateral hip osteoarthritis. No acute findings. Electronically Signed   By: Duanne Guess D.O.   On: 02/23/2023 11:47   Medications:   (feeding supplement) PROSource Plus  30 mL Oral BID BM   amiodarone  200 mg Oral Daily   apixaban  5 mg Oral BID   atorvastatin  40 mg Oral Daily   levETIRAcetam  250 mg Oral Once per day on Tue Thu Sat   levETIRAcetam  500 mg Oral Daily   melatonin  5 mg Oral QHS   sevelamer carbonate  1,600 mg Oral TID WC     Dialysis Orders: TTS at [facility] 4hr, 400/800, EDW 70kg, 2K/2Ca, AVF, 15g needles, - heparin 3000 units IV bolus - no recent ESA, Hgb typically > 11 - no VDRA at the moment, prev on Hectoral - held as of 6/29 for Ca 10.2   Assessment/Plan: Acute R CVA (2 areas): Neuro following, started on Keppra.On Eliquis + HL. Appears to be back to baseline now. CIR. ESRD:  Usual TTS schedule -  No heparin, low UFG. Next HD on Tues. Hypertension/volume: BP high - permissive HTN. No edema. Goal to  keep SBP>150 Anemia: Hgb  11.4  no ESA for now Metabolic bone disease: Ca ok, Phos high - resume sevelamer as binder. Nutrition:  Alb low, continue supps. pA-Fib: On Eliquis + amiodarone     Gay Rape H. Danaya Geddis NP-C 02/24/2023, 11:55 AM  [employer] [phone]

## 2023-02-24 NOTE — Progress Notes (Signed)
Occupational Therapy Session Note  Patient Details  Name: Tanner Rollins MRN: [id number] Date of Birth: 09/16/1950  Today's Date: 02/24/2023 OT Individual Time: 4098-1191 OT Individual Time Calculation (min): 75 min    Short Term Goals: Week 1:  OT Short Term Goal 1 (Week 1): STGs=LTGs due to patient's length of stay.  Skilled Therapeutic Interventions/Progress Updates:    Patient received supine in bed.  Agreeable to OT.  Patient declines need to void, or desire to shower.  Changed into clean pants, and walked with min assist to therapy gym with facilitation for upright posture, step length, and speed.  Patient with tendency to walk fast, with hips flexed.   Box and blocks test essentially equal in R/L - 32 Right (dominant) 33 Left Worked on core muscle activation in quadruped/ sidesit controlling descent in both directions.  Patient reports tenderness at lateral left hip with contact.    Worked on dynamic standing balance/ walking.  Tossing/ catching a ball while standing, then walking.  Able to retrieve ball from floor. Patient reports feeling stronger each day.    Left up in recliner with safety belt in place and engaged and call bell in reach.    Therapy Documentation Precautions:  Precautions Precautions: Fall Precaution Comments: mild L hemipareisis with balance deficits, HD TTSa, hx of seizures Restrictions Weight Bearing Restrictions: No  Pain:  Reports discomfort in left hip from initial fall.  Pain not rated - he indicates overall better.       Therapy/Group: Individual Therapy  [name] 02/24/2023, 12:55 PM

## 2023-02-24 NOTE — Progress Notes (Signed)
Inpatient Rehabilitation  Patient information reviewed and entered into eRehab system by Jamelyn Bovard Tomi Grandpre, OTR/L, Rehab Quality Coordinator.   Information including medical coding, functional ability and quality indicators will be reviewed and updated through discharge.   

## 2023-02-24 NOTE — Progress Notes (Signed)
Speech Language Pathology Daily Session Note  Patient Details  Name: Tanner Rollins MRN: [id number] Date of Birth: 1951-01-18  Today's Date: 02/24/2023 SLP Individual Time: 1100-1200 SLP Individual Time Calculation (min): 60 min  Short Term Goals: Week 1: SLP Short Term Goal 1 (Week 1): Pt will use compensatory memory strategies to recall functional information in his current environment with 80% acc when provided min verbal cues. SLP Short Term Goal 2 (Week 1): Pt will complete verbal problem solving related to finance management/everyday math with 80% acc when provided min verbal cues. SLP Short Term Goal 3 (Week 1): Using strategies, pt will follow complex 3-step directions with 80% acc when provided min verbal cues.  Skilled Therapeutic Interventions:  Pt was seen in am to address cognitive re- training. Pt was alert and seated upright in recliner upon SLP arrival. He denied pain and was agreeable for session. Pt able to verbalize most of events leading up to recent medical hx with limited awareness and/ or comprehension of dx. SLP engaged pt in discussion of compensatory memory strategies. SLP implemented WRAP compensatory strategies and provided examples of utilization. Given a moderate level paragraph presented verbally, SLP guided pt in utilization of repetition and association strategies. Pt recalled information with 80% acc indep improving to 100% after a 10 minute delay. Pt endorsed sister as significant partner in his medical care reporting that she helps him with medication management task. SLP challenged pt in medication management task. He was challenged in comprehension of prescription labels where he completed task with 60% acc with mod cues. SLP also challenged pt in error awareness where he required max cues for <25% acc. Pt was left seated upright in recliner with call button within reach and chair alarm active. SLP to continue POC.   Pain Pain Assessment Pain Scale: 0-10 Pain  Score: 0-No pain  Therapy/Group: Individual Therapy  Renaee Munda 02/24/2023, 3:40 PM

## 2023-02-24 NOTE — Progress Notes (Signed)
Patient ID: Tanner Rollins, male   DOB: 12/16/1950, 72 y.o.   MRN: [id number] Met with the patient to review current situation, rehab process, team conference and plan of care. Discussed secondary risks including HTN, HLD on lipitor, a-fib on pacerone,  Reviewed ESRD; had been driving self to treatment but aware of need for not driving for 6 mths and agreeable to taking a bus to/from HD.  Reported he has a RW and wheelchair from a previous admission. Acknowledged 1200cc FR and renal diet; reported no trouble with limits. Felt he was doing ok overall and sister calls him at least twice a day to check on him. Continue to follow along to address educational needs to facilitate preparation for discharge. Pamelia Hoit

## 2023-02-24 NOTE — Progress Notes (Signed)
Pharmacy:  Admit med review 02/21/23 at 20:15 identified med issue:  - incorrect dosage for Keppra with ESRD  Follow-up done 02/22/23 and order updated 15:20. - 7/6 pharmacist discussed with Dr. Selina Cooley, Neurology - Keppra 500 mg BID changed to 500 mg daily + supplemental dose of 250 mg TTS after hemodialysis.  Dennie Fetters, [place] 02/24/2023 10:54 AM

## 2023-02-24 NOTE — Progress Notes (Signed)
Physical Therapy Session Note  Patient Details  Name: Tanner Rollins MRN: [id number] Date of Birth: 1951/08/09  Today's Date: 02/24/2023 PT Individual Time: 1300-1413 PT Individual Time Calculation (min): 73 min   Short Term Goals: Week 1:  PT Short Term Goal 1 (Week 1): STG = LTG d/t ELOS  Skilled Therapeutic Interventions/Progress Updates:      Pt sitting in recliner to start - agreeable to therapy session and has no c/o pain. Sit<>stand with no AD at [facility] level. Ambulates with CGA and no AD from his room, downstairs via elevator, to [employer]. Ambulates outdoors on unlevel surfaces for gait training, distances >515ft. Patient with slightly ataxic gait pattern with forward trunk lean, fair safety awareness with slowing down pace but then speeds back up - reports he walks fast at baseline. Added stair training while outdoors, navigating stairs with 1 hand rail via reciprocal pattern - cues for monitoring L foot placement to ensure full foot on step.  LiteGait over treadmill without BWS (harness donned for safety). CGA for stepping on/off treadmill.  1) 1.63mph, 6:15 minutes, 789ft. BUE support. Cues for lengthening L step length.  2) 1.0-1.6mph, 3 minutes, 292ft, NO BUE support. Almost completely different gait pattern. Short/shuffling steps, highly unsteady, lost his balance that needed assist correcting and abrupt turning off the treadmill.  3) 1.80mph, 3.5 minutes, 553ft, BUE support, Cues for lengthening L step length  4) 1.1-1.34mph, 4 minutes, 691ft, BUE support, L5 incline.  Ambulated back to his room from day room rehab gym, ~264ft, with CGA and no AD - improved lengthening of L step length with carryover from treadmill training. Patient concluded session seated in recliner, setup for his lunch. All needs met with safety belt alarm on.   Therapy Documentation Precautions:  Precautions Precautions: Fall Precaution Comments: mild L hemipareisis with balance deficits, HD TTSa, hx  of seizures Restrictions Weight Bearing Restrictions: No General:    Therapy/Group: Individual Therapy  Earnest Thalman P Deeanna Beightol  PT, DPT, CSRS  02/24/2023, 7:50 AM

## 2023-02-25 DIAGNOSIS — N186 End stage renal disease: Secondary | ICD-10-CM | POA: Diagnosis not present

## 2023-02-25 DIAGNOSIS — M25552 Pain in left hip: Secondary | ICD-10-CM | POA: Diagnosis not present

## 2023-02-25 DIAGNOSIS — D631 Anemia in chronic kidney disease: Secondary | ICD-10-CM | POA: Diagnosis not present

## 2023-02-25 DIAGNOSIS — E785 Hyperlipidemia, unspecified: Secondary | ICD-10-CM | POA: Diagnosis not present

## 2023-02-25 LAB — CBC
HCT: 36 % — ABNORMAL LOW (ref 39.0–52.0)
Hemoglobin: 11.9 g/dL — ABNORMAL LOW (ref 13.0–17.0)
MCH: 31.8 pg (ref 26.0–34.0)
MCHC: 33.1 g/dL (ref 30.0–36.0)
MCV: 96.3 fL (ref 80.0–100.0)
Platelets: 256 10*3/uL (ref 150–400)
RBC: 3.74 MIL/uL — ABNORMAL LOW (ref 4.22–5.81)
RDW: 14.7 % (ref 11.5–15.5)
WBC: 4.8 10*3/uL (ref 4.0–10.5)
nRBC: 0 % (ref 0.0–0.2)

## 2023-02-25 LAB — RENAL FUNCTION PANEL
Albumin: 3.4 g/dL — ABNORMAL LOW (ref 3.5–5.0)
Anion gap: 17 — ABNORMAL HIGH (ref 5–15)
BUN: 35 mg/dL — ABNORMAL HIGH (ref 8–23)
CO2: 26 mmol/L (ref 22–32)
Calcium: 10 mg/dL (ref 8.9–10.3)
Chloride: 92 mmol/L — ABNORMAL LOW (ref 98–111)
Creatinine, Ser: 13.27 mg/dL — ABNORMAL HIGH (ref 0.61–1.24)
GFR, Estimated: 4 mL/min — ABNORMAL LOW (ref >60)
Glucose, Bld: 85 mg/dL (ref 70–99)
Phosphorus: 4.6 mg/dL (ref 2.5–4.6)
Potassium: 4 mmol/L (ref 3.5–5.1)
Sodium: 135 mmol/L (ref 135–145)

## 2023-02-25 LAB — HEPATITIS B SURFACE ANTIBODY, QUANTITATIVE: Hep B S AB Quant (Post): 4646 m[IU]/mL

## 2023-02-25 MED ORDER — LIDOCAINE HCL (PF) 1 % IJ SOLN: 5.0000 mL | INTRAMUSCULAR | Status: DC | PRN

## 2023-02-25 MED ORDER — VITAMIN D (ERGOCALCIFEROL) 1.25 MG (50000 UNIT) PO CAPS
50000.0000 [IU] | ORAL_CAPSULE | ORAL | Status: DC
  Administered 2023-02-25: 50000 [IU] via ORAL
  Filled 2023-02-25: qty 1

## 2023-02-25 MED ORDER — AMLODIPINE BESYLATE 5 MG PO TABS
5.0000 mg | ORAL_TABLET | Freq: Every day | ORAL | Status: DC
  Administered 2023-02-25: 5 mg via ORAL
  Filled 2023-02-25: qty 1

## 2023-02-25 MED ORDER — LIDOCAINE-PRILOCAINE 2.5-2.5 % EX CREA: 1.0000 | TOPICAL_CREAM | CUTANEOUS | Status: DC | PRN

## 2023-02-25 MED ORDER — PENTAFLUOROPROP-TETRAFLUOROETH EX AERO: 1.0000 | INHALATION_SPRAY | CUTANEOUS | Status: DC | PRN

## 2023-02-25 NOTE — Progress Notes (Signed)
PROGRESS NOTE   Subjective/Complaints: BP elevated to 180s/110s in gym, patient is asymptomatic, patient notes that his BP is usually 130s/70s at home and at home he is sometimes dizzy after dialysis  ROS: as per HPI. Denies CP, SOB, abd pain, N/V/D/C, or any other complaints at this time.  +at home patient has had episodes of dizziness after dialysis  Objective:   No results found. No results for input(s): "WBC", "HGB", "HCT", "PLT" in the last 72 hours.  No results for input(s): "NA", "K", "CL", "CO2", "GLUCOSE", "BUN", "CREATININE", "CALCIUM" in the last 72 hours.   Intake/Output Summary (Last 24 hours) at 02/25/2023 1135 Last data filed at 02/25/2023 1031 Gross per 24 hour  Intake 714 ml  Output --  Net 714 ml        Physical Exam: Vital Signs Blood pressure (!) 172/90, pulse 73, temperature 98.4 F (36.9 C), temperature source Oral, resp. rate 18, height 6' (1.829 m), weight 73.4 kg, SpO2 98 %.  Gen: no distress, normal appearing, BMI 21.71 HEENT: oral mucosa pink and moist, NCAT Cardio: Reg rate Chest: normal effort, normal rate of breathing Abd: soft, non-distended  Ext: no clubbing, cyanosis, or edema Psych: pleasant and cooperative Skin: dry, warm. Healed lesion on L forearm.  MsK: moves extremities antigravity but full msk testing not done. L hip with mild TTP to greater trochanteric bursa area, no crepitus or deformity, able to do ROM without much discomfort.   Neuro: Alert and oriented x 3. Improving recall. Mild left facial weakness with mild to moderate dysarthria (although pt feels that he's close to baseline from speech standpoint). Demonstrates fair insight and awareness. Follows all basic commands. Motor: RUE 4+/5 prox to distal. LUE 4- to 4/5 prox to distal. RLE 4+/5. RLE 4/5 prox to distal. Sensory exam normal for light touch and pain RUE and RLE. May be slightly diminished LUE and LLE. No limb ataxia  or cerebellar signs. No abnormal tone appreciated   Assessment/Plan: 1. Functional deficits which require 3+ hours per day of interdisciplinary therapy in a comprehensive inpatient rehab setting. Physiatrist is providing close team supervision and 24 hour management of active medical problems listed below. Physiatrist and rehab team continue to assess barriers to discharge/monitor patient progress toward functional and medical goals  Care Tool:  Bathing    Body parts bathed by patient: Right arm, Right lower leg, Left lower leg, Left arm, Chest, Face, Abdomen, Front perineal area, Buttocks, Right upper leg, Left upper leg         Bathing assist Assist Level: Contact Guard/Touching assist     Upper Body Dressing/Undressing Upper body dressing   What is the patient wearing?: Pull over shirt    Upper body assist Assist Level: Set up assist    Lower Body Dressing/Undressing Lower body dressing      What is the patient wearing?: Pants     Lower body assist Assist for lower body dressing: Contact Guard/Touching assist     Toileting Toileting Toileting Activity did not occur (Clothing management and hygiene only): N/A (no void or bm)  Toileting assist Assist for toileting: Moderate Assistance - Patient 50 - 74%  Transfers Chair/bed transfer  Transfers assist     Chair/bed transfer assist level: Contact Guard/Touching assist     Locomotion Ambulation   Ambulation assist      Assist level: Minimal Assistance - Patient > 75% Assistive device: No Device Max distance: 250 ft   Walk 10 feet activity   Assist     Assist level: Contact Guard/Touching assist Assistive device: No Device   Walk 50 feet activity   Assist    Assist level: Minimal Assistance - Patient > 75% Assistive device: No Device    Walk 150 feet activity   Assist    Assist level: Minimal Assistance - Patient > 75% Assistive device: No Device    Walk 10 feet on uneven  surface  activity   Assist Walk 10 feet on uneven surfaces activity did not occur: Safety/medical concerns         Wheelchair     Assist Is the patient using a wheelchair?: No   Wheelchair activity did not occur: N/A         Wheelchair 50 feet with 2 turns activity    Assist    Wheelchair 50 feet with 2 turns activity did not occur: N/A       Wheelchair 150 feet activity     Assist  Wheelchair 150 feet activity did not occur: N/A       Blood pressure (!) 172/90, pulse 73, temperature 98.4 F (36.9 C), temperature source Oral, resp. rate 18, height 6' (1.829 m), weight 73.4 kg, SpO2 98 %.  Medical Problem List and Plan: 1. 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  Atrial fibrillation: continue Eliquis 5mg  BID    3. Vitamin D insufficiency: start ergocalciferol 50,000U once per week for 7 weeks  4. Mood/Behavior/Sleep: LCSW to follow for evaluation and support.              --trazodone prn added for insomnia. Used tylenol PM occasionally at home.    -02/22/23 reminded pt about PRN trazodone, will try this tonight -02/23/23 trazodone didn't help much; will schedule melatonin 5mg  QHS, leave PRN trazodone as well             -antipsychotic agents: N/A 5. Neuropsych/cognition: This patient is capable of making decisions on his  own behalf. 6. Skin/Wound Care: routine pressure relief measures.  7. Fluids/Electrolytes/Nutrition:  Strict I/O. 1200 cc FR/day 8. HTN: Monitor BP TID--permissive HTN window completed. Start amlodipine 5mg  daily. --continue to HOLD diltiazem 180mg  QD, coreg (?), clonidine 0.1mg  BID.      9. A fib: Monitor HR TID--on amiodarone 200mg  QD and Eliquis.   -7/6-7/24 HR controlled, monitor    02/25/2023    9:10 AM 02/25/2023    6:35 AM 02/25/2023    5:00 AM  Vitals  with BMI  Weight   161 lbs 13 oz  BMI   21.94  Systolic 172 170   Diastolic 90 94   Pulse  73     10 ESRD: Schedule HD at the end of the day on TTS. 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  11. Seizures hx: Change Keppra to po  with 500mg  daily and additional 250 mg on TTS after HD  12. Anemia of chronic disease: Stable. Serial CBC with HD. Hgb reviewed and was stable at 11.4 on 7/6  13.  Dyslipidemia: HDL-29  Trig-168. Continue Lipitor 40mg  QD.  14. L hip pain: -02/23/23 pt states that he fell on his L hip during the event that resulted in him going to the hospital-- no xrays done. TTP over the greater trochanteric bursa; suspect bursitis from impact, but will obtain Xray of L hip--pending. Kpad ordered.  7/8 XR reviewed and shows mild OA    LOS: 4 days A FACE TO FACE EVALUATION WAS PERFORMED  Maili Shutters P Zaeda Mcferran 02/25/2023, 11:35 AM

## 2023-02-25 NOTE — Progress Notes (Signed)
   02/25/23 2215  Vitals  Temp (!) 97.5 F (36.4 C)  Temp Source Oral  BP (!) 194/116  MAP (mmHg) 140  BP Location Right Arm  BP Method Automatic  Patient Position (if appropriate) Lying  Pulse Rate 81  Pulse Rate Source Monitor  ECG Heart Rate 79  Resp 13  Oxygen Therapy  SpO2 94 %  O2 Device Room Air  During Treatment Monitoring  Intra-Hemodialysis Comments Tx completed;Tolerated well  Post Treatment  Dialyzer Clearance Lightly streaked  Duration of HD Treatment -hour(s) 4 hour(s)  Liters Processed 96  Fluid Removed (mL) 2000 mL  Tolerated HD Treatment Yes  Post-Hemodialysis Comments pt stable  AVG/AVF Arterial Site Held (minutes) 10 minutes  AVG/AVF Venous Site Held (minutes) 10 minutes  Fistula / Graft Left Upper arm Arteriovenous fistula  No placement date or time found.   Placed prior to admission: Yes  Orientation: Left  Access Location: Upper arm  Access Type: Arteriovenous fistula  Site Condition No complications  Fistula / Graft Assessment Thrill;Bruit;Present  Status Deaccessed   Post treatment pt stable, weight 69.6kg standing

## 2023-02-25 NOTE — Progress Notes (Signed)
Pt arrived to the unit in via bed with transport in no acute distress. Denies pain. Dressing intact to left upper arm

## 2023-02-25 NOTE — Progress Notes (Signed)
[facility] Progress Note   Subjective: Seen. No C/Os. No new issues overnight. HD today on schedule.     Objective Vitals:   02/25/23 0635 02/25/23 0910 02/25/23 1334 02/25/23 1339  BP: (!) 170/94 (!) 172/90 (!) 192/105 (!) 198/90  Pulse: 73  83   Resp: 18  15   Temp: 98.4 F (36.9 C)  (!) 97.5 F (36.4 C)   TempSrc: Oral  Oral   SpO2: 98%  99%   Weight:      Height:       Physical Exam General: older male in NAD Heart: S1,S2 no M/R/G Lungs: CTAB A/P Abdomen:NABS/NT Extremities:No LE edema Dialysis Access: L AVF + T/B  Additional Objective Labs: Basic Metabolic Panel: Recent Labs  Lab 02/21/23 0533 02/22/23 1028 02/25/23 1150  NA 134* 135 135  K 4.7 5.1 4.0  CL 93* 94* 92*  CO2 25 26 26   GLUCOSE 68* 94 85  BUN 34* 47* 35*  CREATININE 10.93* 13.87* 13.27*  CALCIUM 9.4 9.5 10.0  PHOS 5.3* 5.5* 4.6   Liver Function Tests: Recent Labs  Lab 02/21/23 0533 02/22/23 1028 02/25/23 1150  ALBUMIN 3.1* 3.2* 3.4*   No results for input(s): "LIPASE", "AMYLASE" in the last 168 hours. CBC: Recent Labs  Lab 02/19/23 0434 02/20/23 0153 02/21/23 0533 02/22/23 1028 02/25/23 1150  WBC 5.2 6.7 6.4 5.6 4.8  HGB 11.4* 10.5* 11.3* 11.4* 11.9*  HCT 34.2* 30.5* 33.9* 34.4* 36.0*  MCV 96.6 91.0 96.9 96.4 96.3  PLT 210 206 218 228 256   Blood Culture    Component Value Date/Time   SDES PLEURAL 02/12/2022 1212   SDES PLEURAL 02/12/2022 1212   SPECREQUEST LEFT LUNG 02/12/2022 1212   SPECREQUEST LEFT LUNG 02/12/2022 1212   CULT  02/12/2022 1212    NO GROWTH 5 DAYS Performed at [facility], [address], [place], [place] [address]    REPTSTATUS 02/12/2022 FINAL 02/12/2022 1212   REPTSTATUS 02/17/2022 FINAL 02/12/2022 1212    Cardiac Enzymes: No results for input(s): "CKTOTAL", "CKMB", "CKMBINDEX", "TROPONINI" in the last 168 hours. CBG: Recent Labs  Lab 02/19/23 0849 02/19/23 1212 02/19/23 1640 02/19/23 2006 02/20/23 0735  GLUCAP  80 95 85 73 87   Iron Studies: No results for input(s): "IRON", "TIBC", "TRANSFERRIN", "FERRITIN" in the last 72 hours. @lablastinr3 @ Studies/Results: No results found. Medications:   (feeding supplement) PROSource Plus  30 mL Oral BID BM   amiodarone  200 mg Oral Daily   amLODipine  5 mg Oral Daily   apixaban  5 mg Oral BID   atorvastatin  40 mg Oral Daily   levETIRAcetam  250 mg Oral Once per day on Tue Thu Sat   levETIRAcetam  500 mg Oral Daily   melatonin  5 mg Oral QHS   sevelamer carbonate  1,600 mg Oral TID WC   Vitamin D (Ergocalciferol)  50,000 Units Oral Q7 days     Dialysis Orders: TTS at [facility] 4hr, 400/800, EDW 70kg, 2K/2Ca, AVF, 15g needles, - heparin 3000 units IV bolus - no recent ESA, Hgb typically > 11 - no VDRA at the moment, prev on Hectoral - held as of 6/29 for Ca 10.2   Assessment/Plan: Acute R CVA (2 areas): Neuro following, started on Keppra.On Eliquis + HL. Appears to be back to baseline now. CIR. ESRD:  Usual TTS schedule -  No heparin, low UFG. Next HD on 02/25/2023. Hypertension/volume: BP high - permissive HTN. No edema. Goal to keep SBP>105  Anemia: Hgb  11.9  no ESA for now Metabolic bone disease: PO4 at goal. Continue binders. Corrected Ca+ high. Hold VDRA.  Nutrition:  Alb low, continue supplement.  pA-Fib: On Eliquis + amiodarone   Jevon Littlepage H. Terressa Evola NP-C 02/25/2023, 2:47 PM  [employer] [phone]

## 2023-02-25 NOTE — Progress Notes (Signed)
Occupational Therapy Session Note  Patient Details  Name: Tanner Rollins MRN: [id number] Date of Birth: 1950-10-26  Today's Date: 02/25/2023 OT Individual Time: 4098-1191 OT Individual Time Calculation (min): 56 min    Short Term Goals: Week 1:  OT Short Term Goal 1 (Week 1): STGs=LTGs due to patient's length of stay.  Skilled Therapeutic Interventions/Progress Updates:  Pt greeted seated in recliner, pt agreeable to OT intervention. Session focus on BADL reeducation, functional mobility, dynamic standing balance, increasing overall activity tolerance and decreasing overall caregiver burden.        Pt requested to shower, covered HD port per pt request. Pt completed ambulatory shower transfer into walkin shower with no AD and CGA. Pt able to doff clothes from sitting on shower seat with supervision and complete all bathing tasks from sitting with supervision. Pt exited shower with supervision with no AD but use of grab bars. Pt completed dressing seated on BSC with overall set- up - distant supervision. Pt exited bathroom with no AD and supervision.   Pt able to stand at sink for 6 mins total A to complete shaving. Pt did report fatigue in legs post self care task, education provided on compensatory/energy conservation tasks for self care. Pt verbalized understanding on education.              Ended session with pt seated in recliner with all needs within reach and safety belt alarm activated.                    Therapy Documentation Precautions:  Precautions Precautions: Fall Precaution Comments: mild L hemipareisis with balance deficits, HD TTSa, hx of seizures Restrictions Weight Bearing Restrictions: No  Pain: no pain     Therapy/Group: Individual Therapy  Pollyann Glen [facility] 02/25/2023, 12:02 PM

## 2023-02-25 NOTE — Progress Notes (Signed)
Physical Therapy Session Note  Patient Details  Name: Tanner Rollins MRN: [id number] Date of Birth: 11-25-1950  Today's Date: 02/25/2023 PT Individual Time: 0800-0900 + 1500-1527 PT Individual Time Calculation (min): 60 min  + 27 min  Short Term Goals: Week 1:  PT Short Term Goal 1 (Week 1): STG = LTG d/t ELOS  Skilled Therapeutic Interventions/Progress Updates:      1st session: Pt in bed to start - agreeable to therapy and no complaints of pain.  Bed mobility completed at independent level with HOB flat. Pt requesting to brush his teeth, wash his face, and urinate before leaving his room. Donned shoes and socks with setupA, able to tie shoes without difficulty. Ambulates within room with close supervision and no AD, b/w the sink and the bathroom. Continent of bladder while standing and completes self care tasks at the sink in standing.  Pt ambulated with no AD at close supervision to intermittent CGA level from his room to main rehab gym. Focused session on dynamic gait training using the agility ladder, cone tapping, forward/backward walking, side stepping, fast and slow walking, quick pivots, etc.   Pt requesting to go outside for fresh air - reports he would spend time outside drinking coffee every morning. Ambulated with CGA off the unit and spent just a few minutes outdoors discussing general DC planning, PLOF and social factors, and anticipated follow up therapy. Pt receptive to all and appreciative of being outside.  Patient reporting feeling slightly "faint." BP checked in rehab gym 188/111 HR 105  MD and RN made aware - suggesting to monitor and continue therapy unless symptomatic.   Pt ended session seated in recliner with safety belt alarm on, call bell in lap, setup with his breakfast.    2nd session: Pt sitting in recliner - agreeable to therapy and no c/o pain. Sit<>stand with CGA and no AD from recliner. Ambulates at [facility] level from his room without an AD to main rehab  gym. Assisted into 4-point position on mat table with CGA and completed quadruped activities to promote weight bearing through all limbs and strengthening for hips - donkey kicks, fire-hydrant, and modified bird-dogs. In main rehab gym, completed x12 minutes of Nustep at L7 resistance with BUE/BLE and encouraged fast cadence to challenge cardiovascular endurance. Ambulated back to his room, ~280ft, with CGA and no AD - gait continues to be slightly ataxic on the L and unsteady with turns. Pt ended session seated EOB with alarm on.     Therapy Documentation Precautions:  Precautions Precautions: Fall Precaution Comments: mild L hemipareisis with balance deficits, HD TTSa, hx of seizures Restrictions Weight Bearing Restrictions: No General:    Therapy/Group: Individual Therapy  Orrin Brigham 02/25/2023, 7:42 AM

## 2023-02-25 NOTE — Progress Notes (Signed)
Speech Language Pathology Daily Session Note  Patient Details  Name: Tanner Rollins MRN: [id number] Date of Birth: 12/23/1950  Today's Date: 02/25/2023 SLP Individual Time: 0900-0958 SLP Individual Time Calculation (min): 58 min  Short Term Goals: Week 1: SLP Short Term Goal 1 (Week 1): Pt will use compensatory memory strategies to recall functional information in his current environment with 80% acc when provided min verbal cues. SLP Short Term Goal 2 (Week 1): Pt will complete verbal problem solving related to finance management/everyday math with 80% acc when provided min verbal cues. SLP Short Term Goal 3 (Week 1): Using strategies, pt will follow complex 3-step directions with 80% acc when provided min verbal cues.  Skilled Therapeutic Interventions:  Pt was seen in am to address cognitive re- training. Pt was alert and seated upright in recliner upon SLP arrival. SLP reviewed recommended memory strategies including writing information down, repetition, and association. Pt challenged in recall of novel moderate level information. SLP guided pt in use of repetition and association strategies Given a 30 minute distracted delay, pt recalled information with 100% acc indep. In additional minutes of session SLP addressed problem solving as it relates to daily and medical tasks. Given scenarios presented verbally, SLP challenged pt to identify solutions. Pt completed task with 85% acc indep improving to 100% with min A. Pt left seated upright in recliner with call button within reach and chair alarm active. SLP to continue POC.   Pain Pain Assessment Pain Scale: 0-10 Pain Score: 0-No pain  Therapy/Group: Individual Therapy  Renaee Munda 02/25/2023, 12:44 PM

## 2023-02-26 MED ORDER — AMLODIPINE BESYLATE 10 MG PO TABS: 10.0000 mg | ORAL_TABLET | Freq: Every day | ORAL | Status: DC

## 2023-02-26 MED ORDER — AMLODIPINE BESYLATE 2.5 MG PO TABS: 2.5000 mg | ORAL_TABLET | Freq: Every day | ORAL | Status: DC

## 2023-02-26 MED ORDER — DILTIAZEM HCL 30 MG PO TABS
30.0000 mg | ORAL_TABLET | Freq: Every day | ORAL | Status: DC
  Administered 2023-02-26: 30 mg via ORAL
  Filled 2023-02-26: qty 1

## 2023-02-26 NOTE — Progress Notes (Signed)
Occupational Therapy Session Note  Patient Details  Name: Tanner Rollins MRN: [id number] Date of Birth: 1950-10-08  Today's Date: 02/26/2023 OT Individual Time: 4098-1191 and 1349-1449 OT Individual Time Calculation (min): 26 min and 60 min   Short Term Goals: Week 1:  OT Short Term Goal 1 (Week 1): STGs=LTGs due to patient's length of stay.  Skilled Therapeutic Interventions/Progress Updates:    AM Session:  Patient received resting in recliner.  Noted patient's shirt was on backward, and patient declined reorienting shirt.  Walked to tub room to practice shower stall transfers.  Patient able to clearly articulate his set up and equipment available, so able to closely simulate his shower at home.  Patient initially uses hand for support to step over lip into shower, but also able to step in without use of support.  Ambulated back to room and left up in recliner - reclined with lights out.  Patient reports being tired after getting back very late from dialysis.  Safety belt in place and engaged and call bell/ personal items in reach.    PM Session:  Patient received resting in recliner. Patient agreeable to OT session, recalling that he could take a shower this session.  Patient able to gather clothes, towels for shower, and safely shower himself with distant supervision.  Patient able to dress himself with clothing oriented correctly, without cueing or set up.  Worked on balance and functional mobility.  Worked on walking at his natural pace, stopping and turning abruptly, and maintaining balance with distractions.  Patient had one loss of balance laterally when turning to look at young lady passing him in hallway.  Patient returned to room and back to recliner.  Patient reporting fatigue due to late return from dialysis.  Patient missed 15 min of session due to fatigue and request to rest.  Safety belt in place and engaged and call bell/ personal items in reach.    Therapy  Documentation Precautions:  Precautions Precautions: Fall Precaution Comments: mild L hemipareisis with balance deficits, HD TTSa, hx of seizures Restrictions Weight Bearing Restrictions: No  AM Session Pain: Pain Assessment Pain Scale: 0-10 Pain Score: 0-No pain  PM Session Pain: Pain Assessment Pain Scale: 0-10 Pain Score: 0-No pain    Therapy/Group: Individual Therapy  [name] 02/26/2023, 1:00 PM

## 2023-02-26 NOTE — Discharge Instructions (Addendum)
Inpatient Rehab Discharge Instructions  Jamir Rone Discharge date and time:  02/19/2023  Activities/Precautions/ Functional Status: Activity: no lifting, driving, or strenuous exercise till cleared by MD Diet: renal diet limit to 5 cups fluid per day Wound Care: none needed   Functional status:  ___ No restrictions     ___ Walk up steps independently ___ 24/7 supervision/assistance   ___ Walk up steps with assistance _X__ Intermittent supervision/assistance  ___ Bathe/dress independently ___ Walk with walker     ___ Bathe/dress with assistance ___ Walk Independently    ___ Shower independently ___ Walk with assistance    ___ Shower with assistance _X__ No alcohol     ___ Return to work/school ________    Special Instructions: Need to follow up with primary care for post hospital follow up in 1-2 weeks.   Per [facility] statutes, patients with seizures are not allowed to drive until  they have been seizure-free for six months. Use caution when using heavy equipment or power tools. Avoid working on ladders or at heights. Take showers instead of baths. Ensure the water temperature is not too high on the home water heater. Do not go swimming alone. Avoid high altitudes or strobe lights. When caring for infants or small children, sit down when holding, feeding, or changing them to minimize risk of injury to the child in the event you have a seizure. Also, Maintain good sleep hygiene. Avoid alcohol.    COMMUNITY REFERRALS UPON DISCHARGE:    Home Health:   PT  OT   SP                   Agency:[facility]   Phone: [phone]   Medical Equipment/Items Ordered:HAS ALL FROM PREVIOUS ADMISSIONS                                                 Agency/Supplier:NA    My questions have been answered and I understand these instructions. I will adhere to these goals and the provided educational materials after my discharge from the hospital.  Patient/Caregiver Signature  _______________________________ Date __________  Clinician Signature _______________________________________ Date __________  Please bring this form and your medication list with you to all your follow-up doctor's appointments.

## 2023-02-26 NOTE — Progress Notes (Signed)
Speech Language Pathology Daily Session Note  Patient Details  Name: Tanner Rollins MRN: [id number] Date of Birth: 05-10-1951  Today's Date: 02/26/2023 SLP Individual Time: 0815-0901 SLP Individual Time Calculation (min): 46 min  Short Term Goals: Week 1: SLP Short Term Goal 1 (Week 1): Pt will use compensatory memory strategies to recall functional information in his current environment with 80% acc when provided min verbal cues. SLP Short Term Goal 2 (Week 1): Pt will complete verbal problem solving related to finance management/everyday math with 80% acc when provided min verbal cues. SLP Short Term Goal 3 (Week 1): Using strategies, pt will follow complex 3-step directions with 80% acc when provided min verbal cues.  Skilled Therapeutic Interventions: [name] was seen for continued cognitive therapy targeting use of compensatory memory strategies to recall functional information, complex problem solving, and following written directions. Patient recalled goal to target memory independently, however required reorientation to previously learned WRAP memory strategies (write, repeat, associate, picture). SLP provided reorientation and patient utilized "write it down" strategy to recall WRAP after 5 minute distracted delay. SLP provided pt with paper and pen and suggested he trial WRAP strategies with other providers to recall important medical information during his stay, however patient exhibits reduced awareness of need for this strategy at this time. During targeted medication management task, patient required min-mod verbal cues to correctly identify mistakes in medication organization after reading written pill bottle instructions. Need for cues increased alongside task complexity. During this medication task, patient verbalized solutions to simple problems independently in 4/5 opportunities, requiring min verbal cues to assist with problem of increased complexity. SLP then targeted recall  of functional medical information via orientation to education binder present at bedside. SLP encouraged patient to review information regularly to promote recall and understanding. Pt left seated upright in bed with call button within reach and bed alarm was on. SLP to continue POC.   Pain Pain Assessment Pain Scale: 0-10 Pain Score: 0-No pain  Therapy/Group: Individual Therapy  Jeannie Done, M.A., CCC-SLP  Yetta Barre 02/26/2023, 9:15 AM

## 2023-02-26 NOTE — Patient Care Conference (Signed)
Inpatient RehabilitationTeam Conference and Plan of Care Update Date: 02/26/2023   Time: 11:19 AM    Patient Name: Tanner Rollins      Medical Record Number: [id number]  Date of Birth: 23-Jan-1951 Sex: Male         Room/Bed: 4M09C/4M09C-01 Payor Info: Payor: [employer] / Plan: [facility] / Product Type: *No Product type* /    Admit Date/Time:  02/21/2023  3:17 PM  Primary Diagnosis:  CVA (cerebral vascular accident) [facility])  Hospital Problems: Principal Problem:   CVA (cerebral vascular accident) [facility])    Expected Discharge Date: Expected Discharge Date: 03/02/23  Team Members Present: Physician leading conference: Dr. Sula Soda Social Worker Present: Dossie Der, LCSW Nurse Present: Chana Bode, RN PT Present: Wynelle Link, PT OT Present: Bretta Bang, OT SLP Present: Feliberto Gottron, SLP PPS Coordinator present : Fae Pippin, SLP     Current Status/Progress Goal Weekly Team Focus  Bowel/Bladder      Oliguric on HD; constipation addressed    HD/constipation managed with prns    Assess need for laxatives and effectiveness  Swallow/Nutrition/ Hydration               ADL's   Min assist initially, contact guard to set up supervision currently   Mod I   Discharge planning, independence with BADL    Mobility   CGA to close supervision for mobility without an AD. Primary barriers: L sided ataxia/weakness, decreased safety awareness, limited family support   Mod I household mobility, supervision community mobility  Dynamic gait training, high level balance training, LiteGait over treadmill training. Possibly schedule an outing with TR    Communication   min A   Min A   goal achieved    Safety/Cognition/ Behavioral Observations  Min to mod A for recall and  problem solving   Min A   implementation of compensatory strategies, awareness, problem solving    Pain      N/a          Skin      N/a            Discharge Planning:  Home with multiple family members checking on him-sister checks three times daily and girlfreind and children. Awaiting team's recommendations   Team Discussion: Patient with unsteadiness and ataxia post CVA.  Patient on target to meet rehab goals: yes, currently needs min assist for ADLs and supervision for community mobility. Resistant to use of adaptive equipment and does not feel safe with a rollator.  Needs in assist for memory strategy use and complex problem solving with good recall. Goals for discharge set for mod I - supervision overall.  *See Care Plan and progress notes for long and short-term goals.   Revisions to Treatment Plan:  N/A   Teaching Needs: Safety, medications, dietary modifications, etc.   Current Barriers to Discharge: Lack of/limited family support and Hemodialysis  Possible Resolutions to Barriers: Family education OP follow up services DME: RW     Medical Summary Current Status: insomnia, ESRD, hypertension, hip pain  Barriers to Discharge: Medical stability  Barriers to Discharge Comments: insomnia, ESRD, hypertension, hip pain Possible Resolutions to [employer] Focus: continue melatonin adn trazodone, continue HD, restart cardizem, XR obtained and shows arthritis, kpad ordered   Continued Need for Acute Rehabilitation Level of Care: The patient requires daily medical management by a physician with specialized training in physical medicine and rehabilitation for the following reasons: Direction of a multidisciplinary physical rehabilitation program to maximize  functional independence : Yes Medical management of patient stability for increased activity during participation in an intensive rehabilitation regime.: Yes Analysis of laboratory values and/or radiology reports with any subsequent need for medication adjustment and/or medical intervention. : Yes   I attest that I was present, lead the team conference, and  concur with the assessment and plan of the team.   Chana Bode B 02/26/2023, 4:05 PM

## 2023-02-26 NOTE — Progress Notes (Signed)
Physical Therapy Session Note  Patient Details  Name: Tanner Rollins MRN: [id number] Date of Birth: 1951/05/10  Today's Date: 02/26/2023 PT Individual Time: 1000-1056 PT Individual Time Calculation (min): 56 min   Short Term Goals: Week 1:  PT Short Term Goal 1 (Week 1): STG = LTG d/t ELOS  Skilled Therapeutic Interventions/Progress Updates:      Pt in bed to start - no c/o pain. Bed mobility completed independently. Donned tennis shoes and socks without assist as he sat EOB. Pt reporting need to void - sit<>stand with CGA and no AD, stiff in the hips and knees. Ambulates with crouched posture and furniture walking with CGA and no AD into the bathroom - continent of bladder void. Hand washing and oral care completed at the sink in standing without assist.  Pt inquiring on RW vs rollator - reports he has a RW at home but may be interested in using a rollator as he's aware of his unsteadiness and poor balance. Focused session on introducing rollator for AD during gait training. Educated him on safety features and technique for rollator management. Ambulated throughout hospital, distances >728ft, with supervision and rollator. Pt tends to push rollator too far forwards and keeps flexed trunk, although improved stability during turns and with functional gait. Also practiced gait training outdoors for unlevel surface training and to address community mobility - supervision assist for outdoor gait. Returned upstairs to [employer] floor and patient reports that he defers the rollator as he feels that it will "get away" from him and prefers using a RW.   Finished session with dynamic gait training with no AD - rope weaving with CGA/min guard 2x73ft to challenge coordination and dynamic balance.  Concluded with patient in recliner, safety belt alarm on, call bell in reach. BP checked at end - 166/106, asymptomatic.    Therapy Documentation Precautions:  Precautions Precautions: Fall Precaution Comments:  mild L hemipareisis with balance deficits, HD TTSa, hx of seizures Restrictions Weight Bearing Restrictions: No General:     Therapy/Group: Individual Therapy  Orrin Brigham 02/26/2023, 7:42 AM

## 2023-02-26 NOTE — Progress Notes (Addendum)
Patient ID: Tanner Rollins, male   DOB: August 06, 1951, 72 y.o.   MRN: [id number]  Met with pt to give team conference update of goals of mod/I-supervision level and target discharge date of 7/14. Discussed therapy recommendations of OP versus Home health. [facility] and glad he will not miss this. Have reached out to his sister to update her also. Await return call from sister.  3:22 Pm Have made referral to [facility] for home health PT OT SP due to pt has transportation issues. [place]-liaison has accepted the referral.

## 2023-02-26 NOTE — Progress Notes (Signed)
PROGRESS NOTE   Subjective/Complaints: No issues overnight BP up to 194/116 last night, increased amlodipine to 10mg  daily.  fluid removed yesterday  ROS: as per HPI. Denies CP, SOB, abd pain, N/V/D/C, or any other complaints at this time.  +at home patient has had episodes of dizziness after dialysis  Objective:   No results found. Recent Labs    02/25/23 1150  WBC 4.8  HGB 11.9*  HCT 36.0*  PLT 256    Recent Labs    02/25/23 1150  NA 135  K 4.0  CL 92*  CO2 26  GLUCOSE 85  BUN 35*  CREATININE 13.27*  CALCIUM 10.0     Intake/Output Summary (Last 24 hours) at 02/26/2023 0714 Last data filed at 02/25/2023 2215 Gross per 24 hour  Intake 716 ml  Output 2000 ml  Net -1284 ml        Physical Exam: Vital Signs Blood pressure (!) 174/88, pulse 82, temperature 98.5 F (36.9 C), temperature source Oral, resp. rate 16, height 6' (1.829 m), weight 69.6 kg, SpO2 100 %.  Gen: no distress, normal appearing, BMI 21.71 HEENT: oral mucosa pink and moist, NCAT Cardio: Reg rate Chest: normal effort, normal rate of breathing Abd: soft, non-distended  Ext: no clubbing, cyanosis, or edema, +bruit from fistula Psych: pleasant and cooperative Skin: dry, warm. Healed lesion on L forearm.  MsK: moves extremities antigravity but full msk testing not done. L hip with mild TTP to greater trochanteric bursa area, no crepitus or deformity, able to do ROM without much discomfort.   Neuro: Alert and oriented x 3. Improving recall. Mild left facial weakness with mild to moderate dysarthria (although pt feels that he's close to baseline from speech standpoint). Demonstrates fair insight and awareness. Follows all basic commands. Motor: RUE 4+/5 prox to distal. LUE 4- to 4/5 prox to distal. RLE 4+/5. RLE 4/5 prox to distal. Sensory exam normal for light touch and pain RUE and RLE. May be slightly diminished LUE and LLE. No limb  ataxia or cerebellar signs. No abnormal tone appreciated   Assessment/Plan: 1. Functional deficits which require 3+ hours per day of interdisciplinary therapy in a comprehensive inpatient rehab setting. Physiatrist is providing close team supervision and 24 hour management of active medical problems listed below. Physiatrist and rehab team continue to assess barriers to discharge/monitor patient progress toward functional and medical goals  Care Tool:  Bathing    Body parts bathed by patient: Right arm, Right lower leg, Left lower leg, Left arm, Chest, Face, Abdomen, Front perineal area, Buttocks, Right upper leg, Left upper leg         Bathing assist Assist Level: Contact Guard/Touching assist     Upper Body Dressing/Undressing Upper body dressing   What is the patient wearing?: Pull over shirt    Upper body assist Assist Level: Set up assist    Lower Body Dressing/Undressing Lower body dressing      What is the patient wearing?: Pants     Lower body assist Assist for lower body dressing: Contact Guard/Touching assist     Toileting Toileting Toileting Activity did not occur (Clothing management and hygiene only): N/A (no void  or bm)  Toileting assist Assist for toileting: Moderate Assistance - Patient 50 - 74%     Transfers Chair/bed transfer  Transfers assist     Chair/bed transfer assist level: Contact Guard/Touching assist     Locomotion Ambulation   Ambulation assist      Assist level: Minimal Assistance - Patient > 75% Assistive device: No Device Max distance: 250 ft   Walk 10 feet activity   Assist     Assist level: Contact Guard/Touching assist Assistive device: No Device   Walk 50 feet activity   Assist    Assist level: Minimal Assistance - Patient > 75% Assistive device: No Device    Walk 150 feet activity   Assist    Assist level: Minimal Assistance - Patient > 75% Assistive device: No Device    Walk 10 feet on uneven  surface  activity   Assist Walk 10 feet on uneven surfaces activity did not occur: Safety/medical concerns         Wheelchair     Assist Is the patient using a wheelchair?: No   Wheelchair activity did not occur: N/A         Wheelchair 50 feet with 2 turns activity    Assist    Wheelchair 50 feet with 2 turns activity did not occur: N/A       Wheelchair 150 feet activity     Assist  Wheelchair 150 feet activity did not occur: N/A       Blood pressure (!) 174/88, pulse 82, temperature 98.5 F (36.9 C), temperature source Oral, resp. rate 16, height 6' (1.829 m), weight 69.6 kg, SpO2 100 %.  Medical Problem List and Plan: 1. 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  Atrial fibrillation: continue Eliquis 5mg  BID    3. Vitamin D insufficiency: start ergocalciferol 50,000U once per week for 7 weeks  4. Insomnia: continue melatonin tp 5mg  HS             --trazodone prn added for insomnia. Used tylenol PM occasionally at home.    -02/22/23 reminded pt about PRN trazodone, will try this tonight -02/23/23 trazodone didn't help much; will schedule melatonin 5mg  QHS, leave PRN trazodone as well             -antipsychotic agents: N/A 5. Neuropsych/cognition: This patient is capable of making decisions on his  own behalf. 6. Skin/Wound Care: routine pressure relief measures.  7. Fluids/Electrolytes/Nutrition:  Strict I/O. 1200 cc FR/day  8. HTN: Monitor BP TID--permissive HTN window completed. Restart Cardizem at 30mg  daily --continue to HOLD diltiazem 180mg  QD, coreg (?), clonidine 0.1mg  BID.      9. A fib: Monitor HR TID--on amiodarone 200mg  QD and Eliquis.   -7/6-7/24 HR controlled, monitor    02/26/2023    4:58 AM 02/25/2023   11:29 PM 02/25/2023   10:39 PM  Vitals with BMI  Weight    153 lbs 7 oz  BMI   20.81  Systolic 174 169   Diastolic 88 99   Pulse 82 78     10 ESRD: Schedule HD at the end of the day on TTS. Renal to follow for management             --  renevela TID ac for metabolic bone disease.   11. Seizures hx: Change Keppra to po with 500mg  daily and additional 250 mg on TTS after HD  12. Anemia of chronic disease: Stable. Serial CBC with HD. Hgb reviewed and was stable at 11.4 on 7/6  13.  Dyslipidemia: HDL-29  Trig-[id number]. Continue Lipitor 40mg  QD.  14. L hip pain: -02/23/23 pt states that he fell on his L hip during the event that resulted in him going to the hospital-- no xrays done. TTP over the greater trochanteric bursa; suspect bursitis from impact, but will obtain Xray of L hip--pending. Kpad ordered.  7/8 XR reviewed and shows mild OA    LOS: 5 days A FACE TO FACE EVALUATION WAS PERFORMED  Clint Bolder P Klara Stjames 02/26/2023, 7:14 AM

## 2023-02-26 NOTE — Progress Notes (Signed)
Contacted by rehab CSW regarding pt's d/c date of Sunday, July 14. Contacted [facility] to provide update to clinic regarding pt's d/c date and that pt should resume care on Tues, July 16. Will assist as needed.   Olivia Canter Renal Navigator [phone]

## 2023-02-27 MED ORDER — DILTIAZEM HCL 30 MG PO TABS
30.0000 mg | ORAL_TABLET | Freq: Once | ORAL | Status: AC
  Administered 2023-02-27: 30 mg via ORAL
  Filled 2023-02-27 (×2): qty 1

## 2023-02-27 MED ORDER — HEPARIN SODIUM (PORCINE) 1000 UNIT/ML IJ SOLN
INTRAMUSCULAR | Status: AC
  Filled 2023-02-27: qty 3

## 2023-02-27 MED ORDER — HEPARIN SODIUM (PORCINE) 1000 UNIT/ML DIALYSIS: 3000.0000 [IU] | Freq: Once | INTRAMUSCULAR | Status: DC

## 2023-02-27 MED ORDER — PENTAFLUOROPROP-TETRAFLUOROETH EX AERO: 1.0000 | INHALATION_SPRAY | CUTANEOUS | Status: DC | PRN

## 2023-02-27 MED ORDER — DILTIAZEM HCL 30 MG PO TABS
60.0000 mg | ORAL_TABLET | Freq: Every day | ORAL | Status: DC
  Administered 2023-02-27: 60 mg via ORAL
  Filled 2023-02-27: qty 2

## 2023-02-27 MED ORDER — LIDOCAINE HCL (PF) 1 % IJ SOLN: 5.0000 mL | INTRAMUSCULAR | Status: DC | PRN

## 2023-02-27 MED ORDER — NEPRO/CARBSTEADY PO LIQD
237.0000 mL | ORAL | Status: DC | PRN
  Filled 2023-02-27: qty 237

## 2023-02-27 MED ORDER — DILTIAZEM HCL 30 MG PO TABS
90.0000 mg | ORAL_TABLET | Freq: Every day | ORAL | Status: DC
  Administered 2023-02-28: 90 mg via ORAL
  Filled 2023-02-27: qty 1
  Filled 2023-02-27: qty 3

## 2023-02-27 MED ORDER — LIDOCAINE-PRILOCAINE 2.5-2.5 % EX CREA: 1.0000 | TOPICAL_CREAM | CUTANEOUS | Status: DC | PRN

## 2023-02-27 NOTE — Progress Notes (Signed)
Occupational Therapy Session Note  Patient Details  Name: Tanner Rollins MRN: [id number] Date of Birth: [birth year]  Today's Date: 02/27/2023 OT Individual Time: 1130-1200 OT Individual Time Calculation (min): 30 min    Short Term Goals: Week 1:  OT Short Term Goal 1 (Week 1): STGs=LTGs due to patient's length of stay.  Skilled Therapeutic Interventions/Progress Updates:    Patient received seated in recliner, just following SLP session.  Agreeable to another OT session - needs to get all therapy done before dialysis this afternoon.  Patient walked to gym, and worked on high level functional ambulation and balance.  Walking at fast pace while tossing and catching a ball, walking and passing a weighted ball between left and right hand.  Patient becomes short of breath when UE activity added to fast walking, but recovers within 2 min - seated rest break.  Patient started a grocery list for his outing tomorrow.  Patient had indicated previously that he drove to get breakfast most days, and when he did not go out - he would prepare a hot breakfast.  Today when pressed to write a grocery list - he shared that he has not been cooking and sister brings him food.  "She prefers I don't even turn on the stove."  Patient making several statements this session about feeling overwhelmed by how much his body has deteriorated from his prior physical state.  Tried to encourage patient that he is stronger than when he started rehab, but patient continued to comment about his physical decline over the past year.    Therapy Documentation Precautions:  Precautions Precautions: Fall Precaution Comments: mild L hemipareisis with balance deficits, HD TTSa, hx of seizures Restrictions Weight Bearing Restrictions: No   Pain:  Reports hip feels better      Therapy/Group: Individual Therapy  [name] 02/27/2023, 12:07 PM

## 2023-02-27 NOTE — Discharge Summary (Signed)
Physician Discharge Summary  Patient ID: Cleatis Fandrich MRN: [id number] DOB/AGE: 1950-11-10 72 y.o.  Admit date: 02/21/2023 Discharge date: 03/04/2023  Discharge Diagnoses:  Principal Problem:   CVA (cerebral vascular accident) [facility]) Active Problems:   Anemia of renal disease   ESRD (end stage renal disease) on dialysis (HCC)   A-fib (HCC)   Metabolic bone disease   Seizure disorder (HCC)   Left hip pain   Discharged Condition: stable  Significant Diagnostic Studies: DG HIP UNILAT WITH PELVIS 2-3 VIEWS LEFT  Result Date: 02/23/2023 CLINICAL DATA:  Left hip pain EXAM: DG HIP (WITH OR WITHOUT PELVIS) 2-3V LEFT COMPARISON:  03/11/2021 FINDINGS: There is no evidence of hip fracture or dislocation. Mild bilateral hip joint space narrowing. Unchanged coarsened trabecular markings within the sacrum and bilateral iliac bones. Both SI joints are fused. Atherosclerotic vascular calcifications. IMPRESSION: Mild bilateral hip osteoarthritis. No acute findings. Electronically Signed   By: Duanne Guess D.O.   On: 02/23/2023 11:47   ECHOCARDIOGRAM COMPLETE BUBBLE STUDY  Result Date: 02/19/2023    ECHOCARDIOGRAM REPORT   Patient Name:   TYREZ BERRIOS Date of Exam: 02/19/2023 Medical Rec #:  [id number]       Height:       72.0 in Accession #:    [id number]      Weight:       165.0 lb Date of Birth:  February 02, 1951       BSA:          1.963 m Patient Age:    71 years        BP:           181/88 mmHg Patient Gender: M               HR:           68 bpm. Exam Location:  Inpatient Procedure: 2D Echo, Cardiac Doppler, Color Doppler and Saline Contrast Bubble            Study Indications:    stroke  History:        Patient has prior history of Echocardiogram examinations, most                 recent 01/24/2022. End stage renal disease, Arrythmias:Atrial                 Fibrillation; Risk Factors:Hypertension.  Sonographer:    Delcie Roch RDCS Referring Phys: [id number] Marquita Palms VINCENT IMPRESSIONS  1. Left  ventricular ejection fraction, by estimation, is 65 to 70%. The left ventricle has normal function. The left ventricle has no regional wall motion abnormalities. There is moderate left ventricular hypertrophy. Left ventricular diastolic parameters are consistent with Grade II diastolic dysfunction (pseudonormalization). Elevated left atrial pressure.  2. Right ventricular systolic function is normal. The right ventricular size is normal. Tricuspid regurgitation signal is inadequate for assessing PA pressure.  3. Left atrial size was moderately dilated.  4. The mitral valve is normal in structure. Trivial mitral valve regurgitation. No evidence of mitral stenosis.  5. The aortic valve is tricuspid. Aortic valve regurgitation is not visualized. No aortic stenosis is present.  6. The inferior vena cava is normal in size with <50% respiratory variability, suggesting right atrial pressure of 8 mmHg.  7. Agitated saline contrast bubble study was negative, with no evidence of any interatrial shunt. FINDINGS  Left Ventricle: Left ventricular ejection fraction, by estimation, is 65 to 70%. The left ventricle has normal function. The left ventricle has no regional  wall motion abnormalities. The left ventricular internal cavity size was normal in size. There is  moderate left ventricular hypertrophy. Left ventricular diastolic parameters are consistent with Grade II diastolic dysfunction (pseudonormalization). Elevated left atrial pressure. Right Ventricle: The right ventricular size is normal. No increase in right ventricular wall thickness. Right ventricular systolic function is normal. Tricuspid regurgitation signal is inadequate for assessing PA pressure. Left Atrium: Left atrial size was moderately dilated. Right Atrium: Right atrial size was normal in size. Pericardium: There is no evidence of pericardial effusion. Mitral Valve: The mitral valve is normal in structure. Trivial mitral valve regurgitation. No evidence of  mitral valve stenosis. Tricuspid Valve: The tricuspid valve is normal in structure. Tricuspid valve regurgitation is trivial. Aortic Valve: The aortic valve is tricuspid. Aortic valve regurgitation is not visualized. No aortic stenosis is present. Pulmonic Valve: The pulmonic valve was not well visualized. Pulmonic valve regurgitation is not visualized. Aorta: The aortic root is normal in size and structure. Venous: The inferior vena cava is normal in size with less than 50% respiratory variability, suggesting right atrial pressure of 8 mmHg. IAS/Shunts: The interatrial septum was not well visualized. Agitated saline contrast was given intravenously to evaluate for intracardiac shunting. Agitated saline contrast bubble study was negative, with no evidence of any interatrial shunt.  LEFT VENTRICLE PLAX 2D LVIDd:         4.40 cm   Diastology LVIDs:         2.60 cm   LV e' medial:    6.09 cm/s LV PW:         1.50 cm   LV E/e' medial:  18.1 LV IVS:        1.30 cm   LV e' lateral:   8.16 cm/s LVOT diam:     2.20 cm   LV E/e' lateral: 13.5 LV SV:         107 LV SV Index:   55 LVOT Area:     3.80 cm  RIGHT VENTRICLE             IVC RV Basal diam:  3.00 cm     IVC diam: 1.60 cm RV S prime:     15.10 cm/s TAPSE (M-mode): 2.1 cm LEFT ATRIUM              Index        RIGHT ATRIUM           Index LA diam:        4.50 cm  2.29 cm/m   RA Area:     20.80 cm LA Vol (A2C):   101.0 ml 51.44 ml/m  RA Volume:   58.70 ml  29.90 ml/m LA Vol (A4C):   73.5 ml  37.44 ml/m LA Biplane Vol: 86.3 ml  43.96 ml/m  AORTIC VALVE LVOT Vmax:   133.00 cm/s LVOT Vmean:  89.300 cm/s LVOT VTI:    0.282 m  AORTA Ao Root diam: 3.20 cm MITRAL VALVE MV Area (PHT): 4.68 cm     SHUNTS MV Decel Time: 162 msec     Systemic VTI:  0.28 m MV E velocity: 110.00 cm/s  Systemic Diam: 2.20 cm MV A velocity: 98.10 cm/s MV E/A ratio:  1.12 Epifanio Lesches MD Electronically signed by Epifanio Lesches MD Signature Date/Time: 02/19/2023/3:02:44 PM    Final     EEG adult  Result Date: 02/19/2023 Charlsie Quest, MD     02/19/2023 10:12 AM Patient Name: Jessica Seidman MRN: [id number] Epilepsy  Attending: Charlsie Quest Referring Physician/Provider: Gwenevere Abbot, MD Date: 02/18/2023 Duration: 26.03 mins Patient history: 72 y.o. male with a past medical history of PAF on eliquis, seizure history- discharged 08/03/2022 on 500mg  keppra BID, HTN, ESRD on HD (fills medications through [place] in [place]) presenting with left side weakness, left facial droop, confusion. Level of alertness: Awake, asleep AEDs during EEG study: LEV Technical aspects: This EEG study was done with scalp electrodes positioned according to the 10-20 International system of electrode placement. Electrical activity was reviewed with band pass filter of 1-70Hz , sensitivity of 7 uV/mm, display speed of 37mm/sec with a 60Hz  notched filter applied as appropriate. EEG data were recorded continuously and digitally stored.  Video monitoring was available and reviewed as appropriate. Description: The posterior dominant rhythm consists of 8 Hz activity of moderate voltage (25-35 uV) seen predominantly in posterior head regions, symmetric and reactive to eye opening and eye closing. Sleep was characterized by vertex waves, sleep spindles (12 to 14 Hz), maximal frontocentral region. EEG showed continuous 3 to 6 Hz theta-delta slowing in right hemisphere. Physiologic photic driving was not seen during photic stimulation.  Hyperventilation was not performed.   ABNORMALITY - Continuous slow, right hemisphere IMPRESSION: This study is suggestive of cortical dysfunction arising from right hemisphere likely secondary to underlying stroke. No seizures or epileptiform discharges were seen throughout the recording. Charlsie Quest   MR BRAIN WO CONTRAST  Result Date: 02/18/2023 CLINICAL DATA:  Provided history: Seizure, new onset, no history of trauma. EXAM: MRI HEAD WITHOUT CONTRAST TECHNIQUE: Multiplanar, multiecho  pulse sequences of the brain and surrounding structures were obtained without intravenous contrast. COMPARISON:  Noncontrast head CT and CT angiogram head/neck 02/18/2023. Brain MRI 07/31/2022 FINDINGS: Brain: Mild generalized parenchymal atrophy. Two acute infarcts within the right corona radiata measuring up to 13 mm. Chronic cortical/subcortical infarct (with associated chronic hemosiderin deposition) in the right parietal lobe. Chronic lacunar infarcts within the bilateral cerebral hemispheric white matter, deep gray nuclei and within the pons. Chronic hemosiderin deposition associated with a chronic lacunar infarct in the right basal ganglia. Background moderate multifocal T2 FLAIR hyperintense signal abnormality within the cerebral white matter, nonspecific but compatible with chronic small vessel ischemic disease. Multiple small chronic infarcts within the left cerebellar hemisphere. Chronic microhemorrhages scattered within the bilateral cerebral hemispheres, thalami, brainstem and cerebellum. No evidence of an intracranial mass No extra-axial fluid collection. No midline shift. Vascular: Maintained flow voids within the proximal large arterial vessels. Skull and upper cervical spine: No suspicious marrow lesion. Incompletely assessed cervical spondylosis. Sinuses/Orbits: No mass or acute finding within the imaged orbits. Mild mucosal thickening versus small mucous retention cyst within the inferior left maxillary sinus. Tiny mucous retention cysts within the bilateral sphenoid sinuses. IMPRESSION: 1. Two acute infarcts within the right corona radiata measuring up to 13 mm. 2. Background parenchymal atrophy, chronic small vessel ischemic disease and chronic infarcts, as detailed and stable from the prior brain MRI of 07/31/2022. 3. As before, there are scattered chronic microhemorrhages within the supratentorial and infratentorial brain. Findings likely at least partly reflect sequelae of chronic  hypertensive microangiopathy. However, a superimposed component of cerebral amyloid angiopathy cannot be excluded. Electronically Signed   By: Jackey Loge D.O.   On: 02/18/2023 11:56   CT ANGIO HEAD NECK W WO CM (CODE STROKE)  Result Date: 02/18/2023 CLINICAL DATA:  Assess intracranial arteries. EXAM: CT ANGIOGRAPHY HEAD AND NECK WITH AND WITHOUT CONTRAST TECHNIQUE: Multidetector CT imaging of the head and neck was performed  using the standard protocol during bolus administration of intravenous contrast. Multiplanar CT image reconstructions and MIPs were obtained to evaluate the vascular anatomy. Carotid stenosis measurements (when applicable) are obtained utilizing NASCET criteria, using the distal internal carotid diameter as the denominator. RADIATION DOSE REDUCTION: This exam was performed according to the departmental dose-optimization program which includes automated exposure control, adjustment of the mA and/or kV according to patient size and/or use of iterative reconstruction technique. CONTRAST:  75mL OMNIPAQUE IOHEXOL 350 MG/ML SOLN COMPARISON:  Same day CT head.  CTA head/neck 07/30/2022. FINDINGS: CTA NECK FINDINGS Aortic arch: Great vessel origins are patent without significant stenosis. Aortic atherosclerosis. Right carotid system: Atherosclerosis at the carotid bifurcation and involving the proximal ICA without greater than 50% stenosis. Left carotid system: Atherosclerosis at the carotid bifurcation without greater than 50% stenosis. Vertebral arteries: Potentially significant stenosis of the right vertebral artery origins; however, evaluation is significantly limited due to streak artifact. Moderate stenosis of the proximal left V2 vertebral artery. Skeleton: No evidence of acute abnormality on limited assessment. Other neck: No acute abnormality on limited assessment. Upper chest: Visualized lung apices are clear. Review of the MIP images confirms the above findings CTA HEAD FINDINGS Anterior  circulation: Bilateral intracranial ICAs, MCAs and ACAs are patent Posterior circulation: Severe stenosis of the intradural right vertebral artery. This vertebral artery is non dominant/small and terminates as PICA, anatomic variant. The left intradural vertebral artery, basilar artery and bilateral posterior cerebral arteries are patent. Left fetal type PCA. Venous sinuses: As permitted by contrast timing, patent. Anatomic variants: Detailed above. Review of the MIP images confirms the above findings IMPRESSION: 1. No emergent large vessel occlusion. 2. 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 3. Moderate proximal left V2 vertebral artery stenosis. 4. Potentially significant stenosis of the right vertebral artery origins; however, evaluation is significantly limited due to streak artifact. 5. Aortic Atherosclerosis (ICD10-I70.0) and Emphysema (ICD10-J43.9). Electronically Signed   By: Feliberto Harts M.D.   On: 02/18/2023 09:40   CT HEAD CODE STROKE WO CONTRAST  Result Date: 02/18/2023 CLINICAL DATA:  Code stroke.  Neuro deficit, acute, stroke suspected EXAM: CT HEAD WITHOUT CONTRAST TECHNIQUE: Contiguous axial images were obtained from the base of the skull through the vertex without intravenous contrast. RADIATION DOSE REDUCTION: This exam was performed according to the departmental dose-optimization program which includes automated exposure control, adjustment of the mA and/or kV according to patient size and/or use of iterative reconstruction technique. COMPARISON:  CT head 07/30/2022. FINDINGS: Brain: Similar appearance of remote infarcts in the right parietal lobe, basal ganglia and right frontal white matter. No evidence of acute large vascular territory infarct, acute hemorrhage, mass lesion, hydrocephalus, or midline shift. Vascular: No hyperdense vessel identified. Skull: No acute fracture. Sinuses/Orbits: Mostly clear sinuses. Left maxillary sinus retention cyst.  Other: No mastoid effusions. ASPECTS [facility] Stroke Program Early CT Score) total score (0-10 with 10 being normal): 10. IMPRESSION: 1. No evidence of acute intracranial abnormality. ASPECTS is 10. 2. Multiple remote infarcts. An MRI could provide more sensitive evaluation for acute on chronic peri-infarct ischemia if clinically warranted. Code stroke imaging results were communicated on 02/18/2023 at 9:19 am to provider Dr. Selina Cooley Via secure text paging. Electronically Signed   By: Feliberto Harts M.D.   On: 02/18/2023 09:20    Labs:  Basic Metabolic Panel:    Latest Ref Rng & Units 02/25/2023   11:50 AM 02/22/2023   10:28 AM 02/21/2023    5:33 AM  BMP  Glucose 70 - 99 mg/dL 85  94  68   BUN 8 - 23 mg/dL 35  47  34   Creatinine 0.61 - 1.24 mg/dL 81.82  99.37  16.96   Sodium 135 - 145 mmol/L 135  135  134   Potassium 3.5 - 5.1 mmol/L 4.0  5.1  4.7   Chloride 98 - 111 mmol/L 92  94  93   CO2 22 - 32 mmol/L 26  26  25    Calcium 8.9 - 10.3 mg/dL 78.9  9.5  9.4      CBC:    Latest Ref Rng & Units 02/25/2023   11:50 AM 02/22/2023   10:28 AM 02/21/2023    5:33 AM  CBC  WBC 4.0 - 10.5 K/uL 4.8  5.6  6.4   Hemoglobin 13.0 - 17.0 g/dL 38.1  01.7  51.0   Hematocrit 39.0 - 52.0 % 36.0  34.4  33.9   Platelets 150 - 400 K/uL 256  228  218      CBG: No results for input(s): "GLUCAP" in the last 168 hours.  Brief HPI:   Shaya Altamura is a 72 y.o. male with history of HTN, ESRD-HD MWF, BPH, A fib, medication non compliance who was admitted on 02/18/23 with left sided weakness with decreased sensation, ws disoriented and noted to have intermittent LUE flaccidly with right gaze deviation.  He had seizure en route to [facility] ED and was treated with Ativan.  Family reported that patient has stopped taking his Keppra 6 months ago.  CTA head/neck was negative for LVO.  MRI brain showed 2 acute infarct right corona radiata, parenchymal atrophy with chronic small vessel disease and scattered chronic  microhemorrhages likely due to HTN angiopathy however amyloid angiopathy not excluded.  He was loaded with Keppra and Dr. Pearlean Brownie felt that stroke was due to nonadherence to Eliquis.  Left upper extremity weakness had improved.  Therapy was working with patient who required min assist with ADLs, cues for safety as well as short-term memory deficits with delay in processing.  Patient was independent prior to admission and CIR recommended due to functional decline.   Hospital Course: Donaldson Richter was admitted to rehab 02/21/2023 for inpatient therapies to consist of PT, ST and OT at least three hours five days a week. Past admission physiatrist, therapy team and rehab RN have worked together to provide customized collaborative inpatient rehab.  He was maintained on Eliquis during his stay.  Serial CBC shows H&H to be stable. His blood pressures were monitored on TID basis and was noted to be trending up. Cardizem resumed and titrated upwards to home dos for better control.  His heart rate controlled on current regimen of amiodarone and cardizem. Insomnia has been an issue but trazodone was ineffective therefore this was changed to melatonin with improvement in sleep hygiene.    Hemodialysis has been ongoing on TTS at the end of the day to help with tolerance of activity.  At admission, pharmacy expressed concerns of Keppra dose in HD patient. This was discussed with neurology and dose adjusted to 500 g daily with additional 250 mg after HD on TTS. He reported left hip pain as well as fall PTA. Xrays  left hip showed mild OA and exam was consistent with greater trochanteric  bursitis which was managed with K pad. . He has been seizure free during his rehab stay. He has made good gains during his stay and was  modified independent  for discharge. However, patient left without informing nurses on 07/14 and family brought patient back with reported of not acting right, difficulty speaking on the way home. Code stroke  was initiated. Stat CT head done revealing new ICH and he was started on Cleveprex for BP control. He was discharged to acute for monitoring and work up.     Rehab course: During patient's stay in rehab weekly team conferences were held to monitor patient's progress, set goals and discuss barriers to discharge. At admission, patient required min assist with mobility and CGA with basic self care tasks. He exhibited mild to moderate cognitive deficits affecting STM, working memory and verbal problem solving. He  has had improvement in activity tolerance, balance, postural control as well as ability to compensate for deficits.  He is able to complete ADL tasks at modified independent level.  He is modified independent for transfers and to ambulate 1000 feet without assistive device.   Disposition: acute hospital  Diet: Renal diet/limit fluids to 1200 cc/day  Medications at discharge: Allergies as of 02/28/2023   No Known Allergies      Medication List     STOP taking these medications    calcium acetate 667 MG capsule Commonly known as: PHOSLO   cloNIDine 0.1 MG tablet Commonly known as: CATAPRES       TAKE these medications    acetaminophen 500 MG tablet Commonly known as: TYLENOL Take 1 tablet (500 mg total) by mouth every 6 (six) hours as needed for moderate pain or headache. What changed:  how much to take when to take this reasons to take this   amiodarone 200 MG tablet Commonly known as: Pacerone Take 1 tablet (200 mg total) by mouth daily.   apixaban 5 MG Tabs tablet Commonly known as: ELIQUIS Take 1 tablet (5 mg total) by mouth 2 (two) times daily.   atorvastatin 40 MG tablet Commonly known as: LIPITOR Take 1 tablet (40 mg total) by mouth daily.   diltiazem 120 MG 24 hr capsule Commonly known as: CARDIZEM CD Take 1 capsule (120 mg total) by mouth daily. Start taking on: March 01, 2023 What changed:  medication strength how much to take   levETIRAcetam  500 MG tablet Commonly known as: KEPPRA Take 1 tablet (500 mg total) by mouth daily. Take addition 250 mg (1/2 tablet) after HD on TTS Start taking on: March 01, 2023 What changed:  when to take this additional instructions   melatonin 5 MG Tabs Take 1 tablet (5 mg total) by mouth at bedtime.   multivitamin with minerals Tabs tablet Take 1 tablet by mouth daily.   sevelamer carbonate 800 MG tablet Commonly known as: RENVELA Take 2 tablets (1,600 mg total) by mouth 3 (three) times daily with meals.   Vitamin D (Ergocalciferol) 1.25 MG (50000 UNIT) Caps capsule Commonly known as: DRISDOL Take 1 capsule (50,000 Units total) by mouth every 7 (seven) days. Start taking on: March 04, 2023        Follow-up Information     Raulkar, Drema Pry, MD Follow up.   Specialty: Physical Medicine and Rehabilitation Why: office will call you with follow up appointment Contact information: [address] [place] [address] [phone]         [facility] Follow up.   Why: office will call you with follow up appointment Contact information: [address]     [address] [place] [address] [phone]  Signed: Jacquelynn Cree 02/28/2023, 6:36 PM

## 2023-02-27 NOTE — Progress Notes (Signed)
Physical Therapy Session Note  Patient Details  Name: Tanner Rollins MRN: [id number] Date of Birth: 04/24/1951  Today's Date: 02/27/2023 PT Individual Time: 0902-1013 PT Individual Time Calculation (min): 71 min   Short Term Goals: Week 1:  PT Short Term Goal 1 (Week 1): STG = LTG d/t ELOS  Skilled Therapeutic Interventions/Progress Updates:      Pt sitting in recliner to start. No reports of pain.   Sit<>stand to RW with supervision and ambulates with RW at supervision level from his room to main rehab gym - improved upright posture and proximity to AD compared to rollator from yesterday.  Focused session on higher level dynamic gait and balance training. Used obstacle course (stepping up and over 6" curb, stepping over x3 6" hurdles, tandem walking across foam balance beam, walking over compliant surface, and adding x10 shoulder press vs squats while holding 5# tidal tank). Min guard for tasks and seated rest breaks b/w sets.   Dynamic gait training while holding 5# Tidal Tank with CGA - adding overhead press while ambulating and used water in Tidal Tank as visual feedback for neutral and equal effort in both arms.   Ambulated community distances in hospital with no AD and CGA to close supervision - continues to have slightly ataxic gait with LLE and trunk leaning while turning. Pt reports that he's hopeful his balance will return.   Outside, spent time discussing general DC planning, home safety, fall prevention, plan for community outing tomorrow AM with TR, follow up therapies, etc. Also practiced gait training outdoors on unlevel surfaces in unfamiliar environment, CGA for safety.   Returned upstairs to CIR floor without a seated rest break (standing rest break in elevator), distances >1055ft. Session concluded with patient in recliner, safety belt alarm on, call bell in reach.   Therapy Documentation Precautions:  Precautions Precautions: Fall Precaution Comments: mild L  hemipareisis with balance deficits, HD TTSa, hx of seizures Restrictions Weight Bearing Restrictions: No General:      Therapy/Group: Individual Therapy  Orrin Brigham 02/27/2023, 7:47 AM

## 2023-02-27 NOTE — Progress Notes (Signed)
Speech Language Pathology Daily Session Note  Patient Details  Name: Tanner Rollins MRN: [id number] Date of Birth: Oct 12, 1950  Today's Date: 02/27/2023 SLP Individual Time: 9528-4132 SLP Individual Time Calculation (min): 47 min  Short Term Goals: Week 1: SLP Short Term Goal 1 (Week 1): Pt will use compensatory memory strategies to recall functional information in his current environment with 80% acc when provided min verbal cues. SLP Short Term Goal 2 (Week 1): Pt will complete verbal problem solving related to finance management/everyday math with 80% acc when provided min verbal cues. SLP Short Term Goal 3 (Week 1): Using strategies, pt will follow complex 3-step directions with 80% acc when provided min verbal cues.  Skilled Therapeutic Interventions: Skilled treatment session focused on cognitive goals. SLP facilitated session by providing overall Min A verbal cues for organization and problem solving during a complex organization task in which patient had to make a schedule when given a list of activities and specific time constraints. Throughout session, patient discussing a variety of hobbies that he likes to participate in at home with SLP supplying patient with paint, brushes and his preferred canvas to utilize at his leisure. Patient left upright in recliner with alarm on and all needs within reach. Continue with current plan of care.      Pain No/Denies Pain   Therapy/Group: Individual Therapy  [name] 02/27/2023, 1:15 PM

## 2023-02-27 NOTE — Progress Notes (Signed)
PROGRESS NOTE   Subjective/Complaints: BP better controlled but still elevated, will increase Cardizem to 60mg  today Appreciate renal navigator assisting with outpatient dialysis  ROS: as per HPI. Denies CP, SOB, abd pain, N/V/D/C, or any other complaints at this time.  +at home patient has had episodes of dizziness after dialysis  Objective:   No results found. Recent Labs    02/25/23 1150  WBC 4.8  HGB 11.9*  HCT 36.0*  PLT 256    Recent Labs    02/25/23 1150  NA 135  K 4.0  CL 92*  CO2 26  GLUCOSE 85  BUN 35*  CREATININE 13.27*  CALCIUM 10.0     Intake/Output Summary (Last 24 hours) at 02/27/2023 0705 Last data filed at 02/26/2023 1900 Gross per 24 hour  Intake 90 ml  Output --  Net 90 ml        Physical Exam: Vital Signs Blood pressure (!) 183/94, pulse 78, temperature 98.5 F (36.9 C), temperature source Oral, resp. rate 18, height 6' (1.829 m), weight 73.4 kg, SpO2 99%.  Gen: no distress, normal appearing, BMI 21.71 HEENT: oral mucosa pink and moist, NCAT Cardio: Reg rate Chest: normal effort, normal rate of breathing Abd: soft, non-distended  Ext: no clubbing, cyanosis, or edema, +bruit from fistula Psych: pleasant and cooperative Skin: dry, warm. Healed lesion on L forearm.  MsK: moves extremities antigravity but full msk testing not done. L hip with mild TTP to greater trochanteric bursa area, no crepitus or deformity, able to do ROM without much discomfort.   Neuro: Alert and oriented x 3. Improving recall. Mild left facial weakness with mild to moderate dysarthria (although pt feels that he's close to baseline from speech standpoint). Demonstrates fair insight and awareness. Follows all basic commands. Motor: RUE 4+/5 prox to distal. LUE 4- to 4/5 prox to distal. RLE 4+/5. RLE 4/5 prox to distal. Sensory exam normal for light touch and pain RUE and RLE. May be slightly diminished LUE and  LLE. No limb ataxia or cerebellar signs. No abnormal tone appreciated. Impaired balance  Assessment/Plan: 1. Functional deficits which require 3+ hours per day of interdisciplinary therapy in a comprehensive inpatient rehab setting. Physiatrist is providing close team supervision and 24 hour management of active medical problems listed below. Physiatrist and rehab team continue to assess barriers to discharge/monitor patient progress toward functional and medical goals  Care Tool:  Bathing    Body parts bathed by patient: Right arm, Right lower leg, Left lower leg, Left arm, Chest, Face, Abdomen, Front perineal area, Buttocks, Right upper leg, Left upper leg         Bathing assist Assist Level: Contact Guard/Touching assist     Upper Body Dressing/Undressing Upper body dressing   What is the patient wearing?: Pull over shirt    Upper body assist Assist Level: Set up assist    Lower Body Dressing/Undressing Lower body dressing      What is the patient wearing?: Pants     Lower body assist Assist for lower body dressing: Contact Guard/Touching assist     Toileting Toileting Toileting Activity did not occur (Clothing management and hygiene only): N/A (no void or  bm)  Toileting assist Assist for toileting: Moderate Assistance - Patient 50 - 74%     Transfers Chair/bed transfer  Transfers assist     Chair/bed transfer assist level: Contact Guard/Touching assist     Locomotion Ambulation   Ambulation assist      Assist level: Minimal Assistance - Patient > 75% Assistive device: No Device Max distance: 250 ft   Walk 10 feet activity   Assist     Assist level: Contact Guard/Touching assist Assistive device: No Device   Walk 50 feet activity   Assist    Assist level: Minimal Assistance - Patient > 75% Assistive device: No Device    Walk 150 feet activity   Assist    Assist level: Minimal Assistance - Patient > 75% Assistive device: No  Device    Walk 10 feet on uneven surface  activity   Assist Walk 10 feet on uneven surfaces activity did not occur: Safety/medical concerns         Wheelchair     Assist Is the patient using a wheelchair?: No   Wheelchair activity did not occur: N/A         Wheelchair 50 feet with 2 turns activity    Assist    Wheelchair 50 feet with 2 turns activity did not occur: N/A       Wheelchair 150 feet activity     Assist  Wheelchair 150 feet activity did not occur: N/A       Blood pressure (!) 183/94, pulse 78, temperature 98.5 F (36.9 C), temperature source Oral, resp. rate 18, height 6' (1.829 m), weight 73.4 kg, SpO2 99%.  Medical Problem List and Plan: 1. 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  Atrial fibrillation: continue Eliquis 5mg  BID    3. Vitamin D insufficiency: start ergocalciferol 50,000U once per week for 7 weeks  4. Insomnia: continue melatonin tp 5mg  HS             --trazodone prn added for insomnia. Used tylenol PM occasionally at home.    -02/22/23 reminded pt about PRN trazodone, will try this tonight -02/23/23 trazodone didn't help much; will schedule melatonin 5mg  QHS, leave PRN trazodone as well             -antipsychotic agents: N/A 5. Neuropsych/cognition: This patient is capable of making decisions on his  own behalf. 6. Skin/Wound Care: routine pressure relief measures.  7. Fluids/Electrolytes/Nutrition:  Strict I/O. 1200 cc FR/day  8. HTN: Monitor BP TID--permissive HTN window completed. Restart Cardizem at 30mg  daily --continue to HOLD diltiazem 180mg  QD, coreg (?), clonidine 0.1mg  BID.      9. A fib: Monitor HR TID--on amiodarone 200mg  QD and Eliquis. Increase Cardizem to 60mg  daily.       02/27/2023    6:38 AM 02/26/2023    7:33 PM 02/26/2023     1:51 PM  Vitals with BMI  Weight  161 lbs 13 oz   BMI  21.94   Systolic 183 165 409  Diastolic 94 93 101  Pulse 78 95 75    10 ESRD: Schedule HD at the end of the day on TTS. Renal to follow for management             --  renevela TID ac for metabolic bone disease.   11. Seizures hx: Continue Keppra 500mg  daily and additional 250 mg on TTS after HD  12. Anemia of chronic disease: Stable. Serial CBC with HD. Hgb reviewed and was stable at 11.4 on 7/6  13.  Dyslipidemia: HDL-29  Trig-168. Continue Lipitor 40mg  QD.  14. L hip pain:continue kpad.  7/8 XR reviewed and shows mild OA    LOS: 6 days A FACE TO FACE EVALUATION WAS PERFORMED  Loriana Samad P Travonne Schowalter 02/27/2023, 7:05 AM

## 2023-02-27 NOTE — Progress Notes (Signed)
Occupational Therapy Session Note  Patient Details  Name: Tanner Rollins MRN: [id number] Date of Birth: 02-Nov-1950  Today's Date: 02/27/2023 OT Individual Time: 0820-0900 OT Individual Time Calculation (min): 40 min    Short Term Goals: Week 1:  OT Short Term Goal 1 (Week 1): STGs=LTGs due to patient's length of stay.  Skilled Therapeutic Interventions/Progress Updates:    Patient received seated at edge of bed.  Agreeable to shower.  Patient able to collect own clothing and set up shower without assistance. Patient needing cueing for next steps of grooming - deodorant, oral care.  Patient walks with stiff gait initially when up, but as he moves - less stiffness noted, and reported by patient.  Patient discussed support he has lined up for discharge - for shopping, for trasnportation to appointments, etc.  Patient's girlfriend may spend more time with him initially to ensure he has adequate daily support.  Patient left up in recliner with safety belt in place and call bell / personal items in reach.    Therapy Documentation Precautions:  Precautions Precautions: Fall Precaution Comments: mild L hemipareisis with balance deficits, HD TTSa, hx of seizures Restrictions Weight Bearing Restrictions: No  Pain:  4/10 hip - alerted nursing.  Tylenol issued by nursing    Therapy/Group: Individual Therapy  [name] 02/27/2023, 9:18 AM

## 2023-02-27 NOTE — Progress Notes (Signed)
Received patient in bed .Awake,alert and oriented x 4.Consent verified.  Medicine given : Heparin 3,000 units pre-run dose.  Access used: Left upper arm AVF that worked well.  Duration of treatment : 4 hours .  Fluid removed : Met 1.5 liters.  Hemo comment:Tolerated treatment.  Hand off to the patient's nurse.

## 2023-02-27 NOTE — Progress Notes (Signed)
[facility] Progress Note   Subjective: Seen on HD. No C/Os. UF as tolerated.     Objective Vitals:   02/27/23 1340 02/27/23 1350 02/27/23 1422 02/27/23 1437  BP: (!) 171/108 (!) 193/100 (!) 198/87 (!) 204/99  Pulse: 68 73 68 70  Resp: 12 15 13 14   Temp: 97.8 F (36.6 C)     TempSrc:      SpO2: 99% 99% 100% 97%  Weight:      Height:       Physical Exam General: older male in NAD Heart: S1,S2 no M/R/G Lungs: CTAB A/P Abdomen:NABS/NT Extremities:No LE edema Dialysis Access: L AVF + T/B  Additional Objective Labs: Basic Metabolic Panel: Recent Labs  Lab 02/21/23 0533 02/22/23 1028 02/25/23 1150  NA 134* 135 135  K 4.7 5.1 4.0  CL 93* 94* 92*  CO2 25 26 26   GLUCOSE 68* 94 85  BUN 34* 47* 35*  CREATININE 10.93* 13.87* 13.27*  CALCIUM 9.4 9.5 10.0  PHOS 5.3* 5.5* 4.6   Liver Function Tests: Recent Labs  Lab 02/21/23 0533 02/22/23 1028 02/25/23 1150  ALBUMIN 3.1* 3.2* 3.4*   No results for input(s): "LIPASE", "AMYLASE" in the last 168 hours. CBC: Recent Labs  Lab 02/21/23 0533 02/22/23 1028 02/25/23 1150  WBC 6.4 5.6 4.8  HGB 11.3* 11.4* 11.9*  HCT 33.9* 34.4* 36.0*  MCV 96.9 96.4 96.3  PLT 218 228 256   Blood Culture    Component Value Date/Time   SDES PLEURAL 02/12/2022 1212   SDES PLEURAL 02/12/2022 1212   SPECREQUEST LEFT LUNG 02/12/2022 1212   SPECREQUEST LEFT LUNG 02/12/2022 1212   CULT  02/12/2022 1212    NO GROWTH 5 DAYS Performed at [facility], [address] [place], [place] [address]    REPTSTATUS 02/12/2022 FINAL 02/12/2022 1212   REPTSTATUS 02/17/2022 FINAL 02/12/2022 1212    Cardiac Enzymes: No results for input(s): "CKTOTAL", "CKMB", "CKMBINDEX", "TROPONINI" in the last 168 hours. CBG: No results for input(s): "GLUCAP" in the last 168 hours. Iron Studies: No results for input(s): "IRON", "TIBC", "TRANSFERRIN", "FERRITIN" in the last 72 hours. @lablastinr3 @ Studies/Results: No results  found. Medications:   (feeding supplement) PROSource Plus  30 mL Oral BID BM   amiodarone  200 mg Oral Daily   apixaban  5 mg Oral BID   atorvastatin  40 mg Oral Daily   diltiazem  60 mg Oral Daily   heparin  3,000 Units Dialysis Once in dialysis   heparin sodium (porcine)       levETIRAcetam  250 mg Oral Once per day on Tue Thu Sat   levETIRAcetam  500 mg Oral Daily   melatonin  5 mg Oral QHS   sevelamer carbonate  1,600 mg Oral TID WC   Vitamin D (Ergocalciferol)  50,000 Units Oral Q7 days     Dialysis Orders: TTS at [facility] 4hr, 400/800, EDW 70kg, 2K/2Ca, AVF, 15g needles, - heparin 3000 units IV bolus - no recent ESA, Hgb typically > 11 - no VDRA at the moment, prev on Hectoral - held as of 6/29 for Ca 10.2   Assessment/Plan: Acute R CVA (2 areas): Neuro following, started on Keppra.On Eliquis + HL. Appears to be back to baseline now. CIR. ESRD:  Usual TTS schedule -  No heparin, low UFG. Next HD on 02/25/2023. Hypertension/volume: BP high - permissive HTN. No edema. Goal to keep SBP>105 Anemia: Hgb  11.9  no ESA for now Metabolic bone disease: PO4 at goal.  Continue binders. Corrected Ca+ high. Hold VDRA.  Nutrition:  Alb low, continue supplement.  pA-Fib: On Eliquis + amiodarone   Ziad Maye H. Destynee Stringfellow NP-C 02/27/2023, 2:43 PM  [employer] [phone]

## 2023-02-28 DIAGNOSIS — M25552 Pain in left hip: Secondary | ICD-10-CM | POA: Insufficient documentation

## 2023-02-28 MED ORDER — APIXABAN 5 MG PO TABS: 5.0000 mg | ORAL_TABLET | Freq: Two times a day (BID) | ORAL | 0 refills | Status: DC

## 2023-02-28 MED ORDER — ACETAMINOPHEN 500 MG PO TABS: 500.0000 mg | ORAL_TABLET | Freq: Four times a day (QID) | ORAL | Status: DC | PRN

## 2023-02-28 MED ORDER — ATORVASTATIN CALCIUM 40 MG PO TABS: 40.0000 mg | ORAL_TABLET | Freq: Every day | ORAL | 0 refills | Status: DC

## 2023-02-28 MED ORDER — DILTIAZEM HCL ER COATED BEADS 120 MG PO CP24: 120.0000 mg | ORAL_CAPSULE | Freq: Every day | ORAL | 0 refills | Status: DC

## 2023-02-28 MED ORDER — DILTIAZEM HCL ER COATED BEADS 120 MG PO CP24
120.0000 mg | ORAL_CAPSULE | Freq: Every day | ORAL | Status: DC
  Administered 2023-03-01 – 2023-03-02 (×2): 120 mg via ORAL
  Filled 2023-02-28 (×2): qty 1

## 2023-02-28 MED ORDER — MELATONIN 5 MG PO TABS: 5.0000 mg | ORAL_TABLET | Freq: Every day | ORAL | 0 refills | Status: DC

## 2023-02-28 MED ORDER — VITAMIN D (ERGOCALCIFEROL) 1.25 MG (50000 UNIT) PO CAPS: 50000.0000 [IU] | ORAL_CAPSULE | ORAL | 0 refills | Status: DC

## 2023-02-28 MED ORDER — AMIODARONE HCL 200 MG PO TABS: 200.0000 mg | ORAL_TABLET | Freq: Every day | ORAL | 0 refills | Status: DC

## 2023-02-28 MED ORDER — SEVELAMER CARBONATE 800 MG PO TABS: 1600.0000 mg | ORAL_TABLET | Freq: Three times a day (TID) | ORAL | 0 refills | Status: DC

## 2023-02-28 MED ORDER — DILTIAZEM HCL 30 MG PO TABS: 120.0000 mg | ORAL_TABLET | Freq: Every day | ORAL | Status: DC

## 2023-02-28 MED ORDER — LEVETIRACETAM 500 MG PO TABS: 500.0000 mg | ORAL_TABLET | Freq: Every day | ORAL | 0 refills | Status: DC

## 2023-02-28 NOTE — Progress Notes (Signed)
Recreational Therapy Session Note  Patient Details  Name: Chavez Cheever MRN: [id number] Date of Birth: Sep 21, 1950 Today's Date: 02/28/2023 Time:  04-1014 Pain: no c/o Skilled Therapeutic Interventions/Progress Updates: Pt participated in Community reintegration/outing to [employer] at [employer]	[place] supervision level with AD.  Pt ambulate >1000' x4.  Pt required min questioning cues to utilize energy conservation strategies during the outing recognizing need for seated rest break and locating a place to sit.  Goals focused on safe community mobility, identification & negotiation of obstacles, accessing public restroom, energy conservation techniques/education.  See outing goal sheet in shadow chart for full details.   Therapy/Group: [employer] Cadie Sorci 02/28/2023, 10:47 AM

## 2023-02-28 NOTE — Progress Notes (Signed)
Physical Therapy Discharge Summary  Patient Details  Name: Tanner Rollins MRN: [id number] Date of Birth: 1951-08-10  Date of Discharge from PT service:February 28, 2023  Today's Date: 02/28/2023 PT Individual Time: 3474-2595 PT Individual Time Calculation (min): 27 min    Patient has met 9 of 9 long term goals due to improved activity tolerance, improved balance, improved postural control, increased strength, ability to compensate for deficits, functional use of  left lower extremity, improved attention, and improved awareness.  Patient to discharge at an ambulatory level Modified Independent.   Patient's care partner is independent to provide the necessary physical assistance at discharge.  Reasons goals not met: n/a  Recommendation:  Patient will benefit from ongoing skilled PT services in outpatient setting to continue to advance safe functional mobility, address ongoing impairments in dynamic standing balance, general strengthening, and minimize fall risk.  Equipment: Pt owns RW  Reasons for discharge: treatment goals met and discharge from hospital  Patient/family agrees with progress made and goals achieved: Yes  PT Discharge Precautions/Restrictions Precautions Precautions: Fall Precaution Comments: mild L hemi with balance impairments; HD TTS Restrictions Weight Bearing Restrictions: No Pain Interference Pain Interference Pain Effect on Sleep: 1. Rarely or not at all Pain Interference with Therapy Activities: 1. Rarely or not at all Pain Interference with Day-to-Day Activities: 1. Rarely or not at all Vision/Perception  Vision - History Ability to See in Adequate Light: 0 Adequate Perception Perception: Within Functional Limits Praxis Praxis: Intact  Cognition Overall Cognitive Status: Within Functional Limits for tasks assessed Arousal/Alertness: Awake/alert Orientation Level: Oriented X4 Attention: Selective Selective Attention: Appears intact Memory: Appears  intact Awareness: Appears intact Problem Solving: Appears intact Safety/Judgment: Appears intact Sensation Sensation Light Touch: Appears Intact Hot/Cold: Appears Intact Proprioception: Appears Intact Coordination Gross Motor Movements are Fluid and Coordinated: No Coordination and Movement Description: mild LLE ataxia and weakness Heel Shin Test: slightly slower with L >R Motor  Motor Motor: Hemiplegia Motor - Skilled Clinical Observations: mild L hemipareisis  Mobility Bed Mobility Bed Mobility: Sit to Supine;Supine to Sit Supine to Sit: Independent Sit to Supine: Independent Transfers Transfers: Sit to Stand;Stand to [employer] Transfers Sit to Stand: Independent Stand to Sit: Independent Stand Pivot Transfers: Independent Transfer (Assistive device): None Locomotion  Gait Ambulation: Yes Gait Assistance: Independent Gait Distance (Feet): 1000 Feet Assistive device: None Gait Gait: Yes Gait Pattern: Impaired Gait Pattern: Narrow base of support;Trunk flexed;Ataxic Stairs / Additional Locomotion Stairs: Yes Stairs Assistance: Independent with assistive device Stair Management Technique: Two rails;Alternating pattern Number of Stairs: 12 Height of Stairs: 6 Pick up small object from the floor assist level: Supervision/Verbal cueing Wheelchair Mobility Wheelchair Mobility: No  Trunk/Postural Assessment  Cervical Assessment Cervical Assessment: Within Functional Limits Thoracic Assessment Thoracic Assessment: Within Functional Limits Lumbar Assessment Lumbar Assessment: Within Functional Limits Postural Control Postural Control: Within Functional Limits  Balance Balance Balance Assessed: Yes Static Sitting Balance Static Sitting - Balance Support: Feet supported;No upper extremity supported Static Sitting - Level of Assistance: 7: Independent Dynamic Sitting Balance Dynamic Sitting - Balance Support: During functional activity;No upper extremity  supported;Feet supported Dynamic Sitting - Level of Assistance: 7: Independent Static Standing Balance Static Standing - Balance Support: During functional activity;No upper extremity supported Static Standing - Level of Assistance: 7: Independent Dynamic Standing Balance Dynamic Standing - Balance Support: No upper extremity supported;During functional activity Dynamic Standing - Level of Assistance: 7: Independent  Extremity Assessment      RLE Assessment RLE Assessment: Within Functional Limits General Strength Comments:  grossly 4+/5 prox to distal LLE Assessment LLE Assessment: Within Functional Limits General Strength Comments: grossly 4/5 prox to distal  Skilled Intervention:  Pt resting in recliner to start - agreeable to PT treatment although reluctant due to fatigue from earlier community outing. Sit<>stand independently with no AD and ambulates at independent level with no AD during session. Patient amenable to go outside for fresh air and discuss DC planning topics. While outside, we reflected on this AM community outing to the grocery store and discussed energy conservation strategies, home safety modifications, fall prevention, etc. Pt receptive to all education and reports strong family support who will be available at DC. Returned upstairs to his room and ended session seated in recliner with all needs met.     Tylea Hise P Zalman Hull  PT, DPT, CSRS  02/28/2023, 12:35 PM

## 2023-02-28 NOTE — Evaluation (Signed)
Recreational Therapy Assessment and Plan  Patient Details  Name: Tanner Rollins MRN: [id number] Date of Birth: Sep 12, 1950 Today's Date: 02/28/2023 LATE ENTRY FOR 02/27/2023  Rehab Potential:  Good ELOS:   d/c 7/14  Assessment  Hospital Problem: Principal Problem:   CVA (cerebral vascular accident) [facility])     Past Medical History:      Past Medical History:  Diagnosis Date   ESRD on hemodialysis (HCC)      [facility]   Hypertension     PAF (paroxysmal atrial fibrillation) (HCC)     Seizure disorder [facility])          Past Surgical History:       Past Surgical History:  Procedure Laterality Date   ANGIOPLASTY Left 06/10/2022    Procedure: Balloon VENOPLASTY;  Surgeon: Tanner Hint, MD;  Location: [facility] OR;  Service: Vascular;  Laterality: Left;   AV FISTULA PLACEMENT Left 01/28/2022    Procedure: ARTERIOVENOUS (AV) FISTULA CREATION VS.GRAFT ARM;  Surgeon: Tanner Hint, MD;  Location: [facility] OR;  Service: Vascular;  Laterality: Left;   FISTULOGRAM Left 06/10/2022    Procedure: FISTULOGRAM;  Surgeon: Tanner Hint, MD;  Location[facility] OR;  Service: Vascular;  Laterality: Left;   IR FLUORO GUIDE CV LINE RIGHT   01/24/2022   IR REMOVAL TUN CV CATH W/O FL   10/04/2022   IR THORACENTESIS ASP PLEURAL SPACE W/IMG GUIDE   02/12/2022   IR US GUIDE VASC ACCESS RIGHT   01/24/2022          Assessment & Plan Clinical Impression: Patient is a 72 y.o. male with history of HTN, ESRD- HD MWF, BPH with elevated PSA (being worked up by [place]), A Fib, medication non-compliance  who was admitted on 02/18/23 with left sided weakness with decreased sensation, was disoriented and noted to have intermittent LUE flaccidity with intermittent right gaze deviation. He had seizure enroute to [facility] ED treated with IV ativan. Not a candidate for thrombolytic and family reported that patient had stopped taking his Keppra 6 months ago.  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  He was loaded with Keppra and EEG done revealing cortical dysfunction from right hemisphere and no seizures. Dr. Pearlean Rollins felt to be due to non-adherence to Eliquis.  2D echo showed EF 65-70% with moderate LVH, moderately dilated LA and negative bubble study. BP continues to be labile and permissive HTN recommended with HD goal to keep SBP>105. PT/OT has been working with patient who requires min assist with ADLs,has delay in processing with decreased STM as well as cues for safe walker use. Patient was independent and driving PTA. CIR recommended due to functional decline.  Patient transferred to CIR on 02/21/2023 .   Pt presents with decreased activity tolerance, decreased functional mobility, decreased balance, feelings of stress Limiting pt's independence with leisure/community pursuits.  Met with pt today to discuss TR services including leisure education, activity analysis/modifications and stress management.  Also discussed the importance of social, emotional, spiritual health in addition to physical health and their effects on overall health and wellness.  Pt stated understanding.   Plan  Min 1 session >20 minutes for community reintegration.  Recommendations for other services: None  and Neuropsych  Discharge Criteria:  Patient will be discharged from TR if patient refuses treatment 3 consecutive times without medical reason.  If treatment goals not met, if there is a change in medical status, if patient makes no progress towards goals or if patient is discharged from hospital.  The above assessment, treatment plan, treatment alternatives and goals were discussed and mutually agreed upon: by  patient  Tanner Rollins 02/28/2023, 10:43 AM

## 2023-02-28 NOTE — Progress Notes (Signed)
Physical Therapy Session Note  Patient Details  Name: Tanner Rollins MRN: [id number] Date of Birth: 12-Sep-1950  Today's Date: 02/28/2023 PT Concurrent Time: 0900-1015 PT Concurrent Time Calculation (min): 75 min  Short Term Goals: Week 1:  PT Short Term Goal 1 (Week 1): STG = LTG d/t ELOS  Skilled Therapeutic Interventions/Progress Updates:      Patient on toilet unattended as he is now mod I in the room. Patient denies any pain and session focused on completing community outing to [employer] to address community mobility goals and to prepare for upcoming DC.   Pt ambulated from his room, downstairs (via elevator), independent without an AD. Assisted into the CIR rehabilitation van with CGA for navigating x3, varying height steps using UE on seat's and grab bars in the [place]. Assist for buckling seat belt.   During transportation to/from [employer], discussed purpose of outing, goals set for outing, and general DC planning. Encouraged discussion with other patient joining for socialization and therapeutic communication.   CGA for stepping out of the van. Patient ambulated throughout grocery store at supervision level while holding onto the [employer]. Pt reports he enjoys eating Spaghetti so problem solved ingredients, locating these, and retrieving them from shelves and placing them into the cart. Patient ambulated >2011ft throughout grocery store at this supervision level, reaching high/low on shelves for items, and able to recall ingredients without cueing. Educated him on awareness for ingredients in chosen items, especially sodium.   Patient becoming fatigued towards the end of the shopping, adequate awareness by requesting to sit to rest. After being fatigued from shopping, patient requiring CGA for safety while ambulating.  Returned to [employer] via rehab Zenaida Niece and assisted back to his room with CGA due to fatigue. Ended session seated in recliner with all needs met. Pt appreciative  of outing with positive feedback.    Therapy Documentation Precautions:  Precautions Precautions: Fall Precaution Comments: mild L hemipareisis with balance deficits, HD TTSa, hx of seizures Restrictions Weight Bearing Restrictions: No General:     Therapy/Group: Individual Therapy  Lilymarie Scroggins P Emersynn Deatley  PT, DPT, CSRS  02/28/2023, 12:23 PM

## 2023-02-28 NOTE — Plan of Care (Signed)
  Problem: RH Balance Goal: LTG Patient will maintain dynamic standing balance (PT) Description: LTG:  Patient will maintain dynamic standing balance with assistance during mobility activities (PT) Outcome: Completed/Met   Problem: Sit to Stand Goal: LTG:  Patient will perform sit to stand with assistance level (PT) Description: LTG:  Patient will perform sit to stand with assistance level (PT) Outcome: Completed/Met   Problem: RH Bed Mobility Goal: LTG Patient will perform bed mobility with assist (PT) Description: LTG: Patient will perform bed mobility with assistance, with/without cues (PT). Outcome: Completed/Met   Problem: RH Bed to Chair Transfers Goal: LTG Patient will perform bed/chair transfers w/assist (PT) Description: LTG: Patient will perform bed to chair transfers with assistance (PT). Outcome: Completed/Met   Problem: RH Car Transfers Goal: LTG Patient will perform car transfers with assist (PT) Description: LTG: Patient will perform car transfers with assistance (PT). Outcome: Completed/Met   Problem: RH Furniture Transfers Goal: LTG Patient will perform furniture transfers w/assist (OT/PT) Description: LTG: Patient will perform furniture transfers  with assistance (OT/PT). Outcome: Completed/Met   Problem: RH Ambulation Goal: LTG Patient will ambulate in home environment (PT) Description: LTG: Patient will ambulate in home environment, # of feet with assistance (PT). Outcome: Completed/Met Goal: LTG Patient will ambulate in community environment (PT) Description: LTG: Patient will ambulate in community environment, # of feet with assistance (PT). Outcome: Completed/Met   Problem: RH Stairs Goal: LTG Patient will ambulate up and down stairs w/assist (PT) Description: LTG: Patient will ambulate up and down # of stairs with assistance (PT) Outcome: Completed/Met   

## 2023-02-28 NOTE — Progress Notes (Signed)
Occupational Therapy Discharge Summary  Patient Details  Name: Tanner Rollins MRN: [id number] Date of Birth: 25-Mar-1951  Date of Discharge from OT service:February 28, 2023  Today's Date: 02/28/2023 OT Individual Time: 0815-0900 OT Individual Time Calculation (min): 45 min  Skilled therapeutic Intervention:  Patient received standing at sink in room brushing his teeth.  Discussed with patient his progress and that he exceeded several of the original goals set for him.  Patient made independent in his room.  Patient pleases to be able to move about room without alarms.  Reviewed plan for therapeutic outing.  Patient very nervous about going out into community stating - "all that walking."  Reassured patient that this was a step toward returning to some of the activities he used to enjoy, but would be carried out safely with professionals he knew and trusted.  Patient able to recognize that he is stronger now than he was upon admission, yet feels sadness over loss of physical abilities he had prior to "taking ill."  Patient left up in recliner with personal items in his reach.    Patient has met 8 of 8 long term goals due to improved activity tolerance, improved balance, ability to compensate for deficits, functional use of  LEFT upper and LEFT lower extremity, improved attention, and improved awareness.  Patient to discharge at overall Independent level.  Patient's care partner is independent to provide the necessary physical assistance at discharge.    Reasons goals not met: NA  Recommendation:  Patient will benefit from ongoing skilled OT services in outpatient setting to continue to advance functional skills in the area of iADL and Reduce care partner burden.  Equipment: No equipment provided  Reasons for discharge: treatment goals met and discharge from hospital  Patient/family agrees with progress made and goals achieved: Yes  OT Discharge Precautions/Restrictions   Precautions Precautions: Fall Precaution Comments: mild L hemi with balance impairments; HD TTS Restrictions Weight Bearing Restrictions: No   Pain Pain Assessment Pain Score: 0-No pain ADL ADL Eating: Independent Where Assessed-Eating: Chair Grooming: Independent Where Assessed-Grooming: Standing at sink Upper Body Bathing: Independent Where Assessed-Upper Body Bathing: Shower, Standing at sink Lower Body Bathing: Independent Where Assessed-Lower Body Bathing: Shower, Sitting at sink, Standing at sink Upper Body Dressing: Independent Where Assessed-Upper Body Dressing: Chair Lower Body Dressing: Independent Where Assessed-Lower Body Dressing: Chair, Standing at sink, Sitting at sink Toileting: Independent Where Assessed-Toileting: Teacher, adult education: Community education officer Method: Proofreader: Bedside commode, Grab bars Tub/Shower Transfer: Unable to assess Tub/Shower Transfer Method: Unable to assess Film/video editor: Modified independent Film/video editor Method: Designer, industrial/product: Sales promotion account executive Baseline Vision/History: 1 Wears glasses Patient Visual Report: No change from baseline Vision Assessment?: No apparent visual deficits Visual Fields: No apparent deficits Perception  Perception: Within Functional Limits Praxis Praxis: Intact Cognition Cognition Overall Cognitive Status: Within Functional Limits for tasks assessed Arousal/Alertness: Awake/alert Orientation Level: Place;Person;Situation Memory: Appears intact Attention: Selective Selective Attention: Appears intact Awareness: Appears intact Problem Solving: Appears intact Safety/Judgment: Appears intact Brief Interview for Mental Status (BIMS) Repetition of Three Words (First Attempt): 3 Temporal Orientation: Year: Correct Temporal Orientation: Month: Accurate within 5 days Temporal Orientation: Day: Correct Recall:  "Sock": Yes, no cue required Recall: "Blue": Yes, no cue required Recall: "Bed": Yes, no cue required BIMS Summary Score: 15 Sensation Sensation Light Touch: Appears Intact Hot/Cold: Appears Intact Proprioception: Appears Intact Stereognosis: Appears Intact Coordination Gross Motor Movements are Fluid and Coordinated: No Fine Motor Movements  are Fluid and Coordinated: No Coordination and Movement Description: mild LLE ataxia and weakness Heel Shin Test: slightly slower with L >R Motor  Motor Motor: Hemiplegia Motor - Skilled Clinical Observations: mild L hemipareisis Motor - Discharge Observations: very mild left hemiplegia Mobility  Bed Mobility Bed Mobility: Sit to Supine;Supine to Sit Rolling Left: Independent Left Sidelying to Sit: Independent Supine to Sit: Independent Sit to Supine: Independent Transfers Sit to Stand: Independent Stand to Sit: Independent  Trunk/Postural Assessment  Cervical Assessment Cervical Assessment: Within Functional Limits Thoracic Assessment Thoracic Assessment: Within Functional Limits Lumbar Assessment Lumbar Assessment: Within Functional Limits Postural Control Postural Control: Within Functional Limits  Balance Balance Balance Assessed: Yes Static Sitting Balance Static Sitting - Balance Support: Feet supported;No upper extremity supported Static Sitting - Level of Assistance: 7: Independent Dynamic Sitting Balance Dynamic Sitting - Balance Support: During functional activity;No upper extremity supported;Feet supported Dynamic Sitting - Level of Assistance: 7: Independent Static Standing Balance Static Standing - Balance Support: During functional activity;No upper extremity supported Static Standing - Level of Assistance: 7: Independent Dynamic Standing Balance Dynamic Standing - Balance Support: No upper extremity supported;During functional activity Dynamic Standing - Level of Assistance: 7: Independent Extremity/Trunk  Assessment RUE Assessment RUE Assessment: Within Functional Limits LUE Assessment LUE Assessment: Within Functional Limits   [name] 02/28/2023, 1:06 PM

## 2023-02-28 NOTE — Progress Notes (Signed)
Speech Language Pathology Daily Session Note  Patient Details  Name: Tanner Rollins MRN: [id number] Date of Birth: [birth year]  Today's Date: 02/28/2023 SLP Individual Time: 4098-1191 SLP Individual Time Calculation (min): 40 min  Short Term Goals: Week 1: SLP Short Term Goal 1 (Week 1): Pt will use compensatory memory strategies to recall functional information in his current environment with 80% acc when provided min verbal cues. SLP Short Term Goal 2 (Week 1): Pt will complete verbal problem solving related to finance management/everyday math with 80% acc when provided min verbal cues. SLP Short Term Goal 3 (Week 1): Using strategies, pt will follow complex 3-step directions with 80% acc when provided min verbal cues.  Skilled Therapeutic Interventions: Skilled treatment session focused on cognitive goals. Patient appeared extremely tired after his community outing and reported fatigue. SLP facilitated session by providing education regarding  memory compensatory strategies and how to incorporate the strategies while at home to maximize recall and carryover of functional information. SLP also provided education regarding strategies for maintaining a healthy brain lifestyle. Patient verbalized understanding and handouts were given to reinforce information. Patient left upright in recliner with all needs within reach. Continue with current plan of care.      Pain No/Denies Pain   Therapy/Group: Individual Therapy  Tanner Rollins 02/28/2023, 12:05 PM

## 2023-02-28 NOTE — Progress Notes (Signed)
Medications for discharge reviewed with patient. He was non-compliant with meds and reports that he has a list at home but unable to recall what they are. Offered to send them to local [facility] as they are contracted with [facility]. He requested them to be sent to [facility] despite my recommendations. Advised him that we are not contracted with [facility] and that he will need meds on Sun pm as well as Mon am prior to his appt at the [place].

## 2023-02-28 NOTE — Progress Notes (Signed)
Recreational Therapy Discharge Summary Patient Details  Name: Tanner Rollins MRN: [id number] Date of Birth: 03-Sep-1950 Today's Date: 02/28/2023  Comments on progress toward goals: Pt has made excellent progress during LOS and is set for discharge home at mod I level ambulatory level without AD.  TR sessions focused on pt education, activity analysis identifying potential modifications and community reintegration.  Pt participated in a community outing to [employer] at [employer] ambulatory level.  Pt requires min questioning cues for energy conservation.  Pt is anxious to return home and to previously enjoyed activities.  Reasons for discharge: discharge from hospital  Follow-up: Home Health  Patient/family agrees with progress made and goals achieved: Yes  [name] 02/28/2023, 12:05 PM

## 2023-02-28 NOTE — Progress Notes (Signed)
PROGRESS NOTE   Subjective/Complaints: BP better controlled, discussed increase in cardizem to 90 today, 120 tomorrow Outing today Patient has no new complaints  ROS: as per HPI. Denies CP, SOB, abd pain, N/V/D/C, or any other complaints at this time.  +at home patient has had episodes of dizziness after dialysis  Objective:   No results found. Recent Labs    02/25/23 1150  WBC 4.8  HGB 11.9*  HCT 36.0*  PLT 256    Recent Labs    02/25/23 1150  NA 135  K 4.0  CL 92*  CO2 26  GLUCOSE 85  BUN 35*  CREATININE 13.27*  CALCIUM 10.0     Intake/Output Summary (Last 24 hours) at 02/28/2023 1143 Last data filed at 02/28/2023 0600 Gross per 24 hour  Intake 380 ml  Output 1500 ml  Net -1120 ml        Physical Exam: Vital Signs Blood pressure (!) 160/70, pulse 73, temperature 98.2 F (36.8 C), temperature source Oral, resp. rate 20, height 6' (1.829 m), weight 73.1 kg, SpO2 100%.  Gen: no distress, normal appearing, BMI 21.86 HEENT: oral mucosa pink and moist, NCAT Cardio: Reg rate Chest: normal effort, normal rate of breathing Abd: soft, non-distended  Ext: no clubbing, cyanosis, or edema, +bruit from fistula Psych: pleasant and cooperative Skin: dry, warm. Healed lesion on L forearm.  MsK: moves extremities antigravity but full msk testing not done. L hip with mild TTP to greater trochanteric bursa area, no crepitus or deformity, able to do ROM without much discomfort.   Neuro: Alert and oriented x 3. Improving recall. Mild left facial weakness with mild to moderate dysarthria (although pt feels that he's close to baseline from speech standpoint). Demonstrates fair insight and awareness. Follows all basic commands. Motor: RUE 4+/5 prox to distal. LUE 4- to 4/5 prox to distal. RLE 4+/5. RLE 4/5 prox to distal. Sensory exam normal for light touch and pain RUE and RLE. May be slightly diminished LUE and LLE. No  limb ataxia or cerebellar signs. No abnormal tone appreciated. Impaired balance  Assessment/Plan: 1. Functional deficits which require 3+ hours per day of interdisciplinary therapy in a comprehensive inpatient rehab setting. Physiatrist is providing close team supervision and 24 hour management of active medical problems listed below. Physiatrist and rehab team continue to assess barriers to discharge/monitor patient progress toward functional and medical goals  Care Tool:  Bathing    Body parts bathed by patient: Right arm, Right lower leg, Left lower leg, Left arm, Chest, Face, Abdomen, Front perineal area, Buttocks, Right upper leg, Left upper leg         Bathing assist Assist Level: Contact Guard/Touching assist     Upper Body Dressing/Undressing Upper body dressing   What is the patient wearing?: Pull over shirt    Upper body assist Assist Level: Set up assist    Lower Body Dressing/Undressing Lower body dressing      What is the patient wearing?: Pants     Lower body assist Assist for lower body dressing: Contact Guard/Touching assist     Toileting Toileting Toileting Activity did not occur (Clothing management and hygiene only): N/A (no void  or bm)  Toileting assist Assist for toileting: Moderate Assistance - Patient 50 - 74%     Transfers Chair/bed transfer  Transfers assist     Chair/bed transfer assist level: Contact Guard/Touching assist     Locomotion Ambulation   Ambulation assist      Assist level: Minimal Assistance - Patient > 75% Assistive device: No Device Max distance: 250 ft   Walk 10 feet activity   Assist     Assist level: Contact Guard/Touching assist Assistive device: No Device   Walk 50 feet activity   Assist    Assist level: Minimal Assistance - Patient > 75% Assistive device: No Device    Walk 150 feet activity   Assist    Assist level: Minimal Assistance - Patient > 75% Assistive device: No Device     Walk 10 feet on uneven surface  activity   Assist Walk 10 feet on uneven surfaces activity did not occur: Safety/medical concerns         Wheelchair     Assist Is the patient using a wheelchair?: No   Wheelchair activity did not occur: N/A         Wheelchair 50 feet with 2 turns activity    Assist    Wheelchair 50 feet with 2 turns activity did not occur: N/A       Wheelchair 150 feet activity     Assist  Wheelchair 150 feet activity did not occur: N/A       Blood pressure (!) 160/70, pulse 73, temperature 98.2 F (36.8 C), temperature source Oral, resp. rate 20, height 6' (1.829 m), weight 73.1 kg, SpO2 100%.  Medical Problem List and Plan: 1. 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  Atrial fibrillation: continue Eliquis 5mg  BID. Increase Cardizem to 120mg  daily for tomorrow    3. Vitamin D insufficiency: start ergocalciferol 50,000U once per week for 7 weeks  4. Insomnia: continue melatonin tp 5mg  HS             --trazodone prn added for insomnia. Used tylenol PM occasionally at home.    -02/22/23 reminded pt about PRN trazodone, will try this tonight -02/23/23 trazodone didn't help much; will schedule melatonin 5mg  QHS, leave PRN trazodone as well             -antipsychotic agents: N/A 5. Neuropsych/cognition: This patient is capable of making decisions on his  own behalf. 6. Skin/Wound Care: routine pressure relief measures.  7. Fluids/Electrolytes/Nutrition:  Strict I/O. 1200 cc FR/day  8. HTN: Monitor BP TID--permissive HTN window completed. Increase Cardizem to 120mg  daily for tomorrow --continue to HOLD diltiazem 180mg  QD, coreg (?), clonidine 0.1mg  BID.      9. Impaired balance: continue PT      02/28/2023    6:41 AM 02/28/2023    5:43 AM  02/28/2023    5:00 AM  Vitals with BMI  Weight   161 lbs 3 oz  BMI   21.85  Systolic 160 193   Diastolic 70 91   Pulse 73 73     10 ESRD: Schedule HD at the end of the day on TTS. Renal to follow for management             --  renevela TID ac for metabolic bone disease.   11. Seizures hx: Continue Keppra 500mg  daily and additional 250 mg on TTS after HD  12. Anemia of chronic disease: Stable. Serial CBC with HD. Hgb reviewed and was stable at 11.4 on 7/6  13.  Dyslipidemia: HDL-29  [id number]. Continue Lipitor 40mg  QD.  14. L hip pain:continue kpad.  7/8 XR reviewed and shows mild OA    LOS: 7 days A FACE TO FACE EVALUATION WAS PERFORMED  Tanner Rollins 02/28/2023, 11:43 AM

## 2023-03-01 LAB — RENAL FUNCTION PANEL
Albumin: 3.2 g/dL — ABNORMAL LOW (ref 3.5–5.0)
Anion gap: 18 — ABNORMAL HIGH (ref 5–15)
BUN: 28 mg/dL — ABNORMAL HIGH (ref 8–23)
CO2: 24 mmol/L (ref 22–32)
Calcium: 9.8 mg/dL (ref 8.9–10.3)
Chloride: 95 mmol/L — ABNORMAL LOW (ref 98–111)
Creatinine, Ser: 10.71 mg/dL — ABNORMAL HIGH (ref 0.61–1.24)
GFR, Estimated: 5 mL/min — ABNORMAL LOW (ref >60)
Glucose, Bld: 150 mg/dL — ABNORMAL HIGH (ref 70–99)
Phosphorus: 4 mg/dL (ref 2.5–4.6)
Potassium: 4.4 mmol/L (ref 3.5–5.1)
Sodium: 137 mmol/L (ref 135–145)

## 2023-03-01 LAB — CBC
HCT: 32.4 % — ABNORMAL LOW (ref 39.0–52.0)
Hemoglobin: 10.5 g/dL — ABNORMAL LOW (ref 13.0–17.0)
MCH: 31.1 pg (ref 26.0–34.0)
MCHC: 32.4 g/dL (ref 30.0–36.0)
MCV: 95.9 fL (ref 80.0–100.0)
Platelets: 210 10*3/uL (ref 150–400)
RBC: 3.38 MIL/uL — ABNORMAL LOW (ref 4.22–5.81)
RDW: 14.5 % (ref 11.5–15.5)
WBC: 5.7 10*3/uL (ref 4.0–10.5)
nRBC: 0 % (ref 0.0–0.2)

## 2023-03-01 LAB — GLUCOSE, CAPILLARY: Glucose-Capillary: 114 mg/dL — ABNORMAL HIGH (ref 70–99)

## 2023-03-01 MED ORDER — PENTAFLUOROPROP-TETRAFLUOROETH EX AERO
INHALATION_SPRAY | CUTANEOUS | Status: AC
  Filled 2023-03-01: qty 30

## 2023-03-01 NOTE — Plan of Care (Signed)
  Problem: RH Problem Solving Goal: LTG Patient will demonstrate problem solving for (SLP) Description: LTG:  Patient will demonstrate problem solving for basic/complex daily situations with cues  (SLP) Outcome: Completed/Met   Problem: RH Memory Goal: LTG Patient will use memory compensatory aids to (SLP) Description: LTG:  Patient will use memory compensatory aids to recall biographical/new, daily complex information with cues (SLP) Outcome: Completed/Met   Problem: RH Comprehension Communication Goal: LTG Patient will comprehend basic/complex auditory (SLP) Description: LTG: Patient will comprehend basic/complex auditory information with cues (SLP). Outcome: Completed/Met

## 2023-03-01 NOTE — Progress Notes (Signed)
PROGRESS NOTE   Subjective/Complaints:  Pt slept poorly but again forgot he can ask for trazodone; will try that out tonight. Can schedule it if it helps.  Denies pain. LBM yesterday per patient but not well documented, unclear when it was.  Urinating about the same as he usually does. Seen in dialysis today. No other complaints or concerns.   ROS: as per HPI. Denies CP, SOB, abd pain, N/V/D/C, or any other complaints at this time.  +at home patient has had episodes of dizziness after dialysis  Objective:   No results found. Recent Labs    03/01/23 0900  WBC 5.7  HGB 10.5*  HCT 32.4*  PLT 210     Recent Labs    03/01/23 0900  NA 137  K 4.4  CL 95*  CO2 24  GLUCOSE 150*  BUN 28*  CREATININE 10.71*  CALCIUM 9.8      Intake/Output Summary (Last 24 hours) at 03/01/2023 1309 Last data filed at 03/01/2023 0800 Gross per 24 hour  Intake 1020 ml  Output --  Net 1020 ml        Physical Exam: Vital Signs Blood pressure (!) 175/92, pulse 66, temperature 98.1 F (36.7 C), temperature source Oral, resp. rate 14, height 6' (1.829 m), weight 72.8 kg, SpO2 98%.  Gen: no distress, normal appearing, BMI 21.86, laying in bed in dialysis HEENT: oral mucosa pink and moist, NCAT Cardio: Reg rate and rhythm, nl s1/s2, no m/r/g appreciated Chest: normal effort, normal rate of breathing, CTAB Abd: soft, non-distended, nonTTP Ext: no clubbing, cyanosis, or edema, LUE fistula in use Psych: pleasant and cooperative Skin: dry, warm. Healed lesion on L forearm.   PRIOR EXAMS: MsK: moves extremities antigravity but full msk testing not done. L hip with mild TTP to greater trochanteric bursa area, no crepitus or deformity, able to do ROM without much discomfort.   Neuro: Alert and oriented x 3. Improving recall. Mild left facial weakness with mild to moderate dysarthria (although pt feels that he's close to baseline from speech  standpoint). Demonstrates fair insight and awareness. Follows all basic commands. Motor: RUE 4+/5 prox to distal. LUE 4- to 4/5 prox to distal. RLE 4+/5. RLE 4/5 prox to distal. Sensory exam normal for light touch and pain RUE and RLE. May be slightly diminished LUE and LLE. No limb ataxia or cerebellar signs. No abnormal tone appreciated. Impaired balance  Assessment/Plan: 1. Functional deficits which require 3+ hours per day of interdisciplinary therapy in a comprehensive inpatient rehab setting. Physiatrist is providing close team supervision and 24 hour management of active medical problems listed below. Physiatrist and rehab team continue to assess barriers to discharge/monitor patient progress toward functional and medical goals  Care Tool:  Bathing    Body parts bathed by patient: Right arm, Right lower leg, Left lower leg, Left arm, Chest, Face, Abdomen, Front perineal area, Buttocks, Right upper leg, Left upper leg         Bathing assist Assist Level: Independent     Upper Body Dressing/Undressing Upper body dressing   What is the patient wearing?: Pull over shirt    Upper body assist Assist Level: Independent  Lower Body Dressing/Undressing Lower body dressing      What is the patient wearing?: Pants, Underwear/pull up     Lower body assist Assist for lower body dressing: Independent     Toileting Toileting Toileting Activity did not occur (Clothing management and hygiene only): N/A (no void or bm)  Toileting assist Assist for toileting: Independent     Transfers Chair/bed transfer  Transfers assist     Chair/bed transfer assist level: Independent     Locomotion Ambulation   Ambulation assist      Assist level: Independent Assistive device: No Device Max distance: 127ft   Walk 10 feet activity   Assist     Assist level: Independent Assistive device: No Device   Walk 50 feet activity   Assist    Assist level:  Independent Assistive device: No Device    Walk 150 feet activity   Assist    Assist level: Independent Assistive device: No Device    Walk 10 feet on uneven surface  activity   Assist Walk 10 feet on uneven surfaces activity did not occur: Safety/medical concerns   Assist level: Supervision/Verbal cueing     Wheelchair     Assist Is the patient using a wheelchair?: No   Wheelchair activity did not occur: N/A         Wheelchair 50 feet with 2 turns activity    Assist    Wheelchair 50 feet with 2 turns activity did not occur: N/A       Wheelchair 150 feet activity     Assist  Wheelchair 150 feet activity did not occur: N/A       Blood pressure (!) 175/92, pulse 66, temperature 98.1 F (36.7 C), temperature source Oral, resp. rate 14, height 6' (1.829 m), weight 72.8 kg, SpO2 98%.  Medical Problem List and Plan: 1. 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  Therapy notes reviewed  Discussed outing today  2.  Atrial fibrillation: continue Eliquis 5mg  BID. Increase Cardizem to 120mg  daily for tomorrow    3. Vitamin D insufficiency: start ergocalciferol 50,000U once per week for 7 weeks  4. Insomnia: continue melatonin tp 5mg  HS             --trazodone prn added for insomnia. Used tylenol PM occasionally at home.    -02/22/23 reminded pt about PRN trazodone, will try this tonight -02/23/23 trazodone didn't help much; will schedule melatonin 5mg  QHS, leave PRN trazodone as well -03/01/23 still sleeping poorly, wants to try trazodone again, could schedule it if it helps             -antipsychotic agents: N/A 5. Neuropsych/cognition: This patient is capable of making decisions on his  own behalf. 6. Skin/Wound Care: routine pressure relief measures.  7. Fluids/Electrolytes/Nutrition:   Strict I/O. 1200 cc FR/day  8. HTN: Monitor BP TID--permissive HTN window completed. Increase Cardizem to 120mg  daily for tomorrow --continue to HOLD diltiazem 180mg  QD, coreg (?), clonidine 0.1mg  BID.   -03/01/23 BPs high, restarted cardizem CD 120mg  daily today so will need to monitor BPs to see what changes need to be made. Suspect will need to go back on prior BP meds but looks like several changes were made recently; defer to primary  team.  Vitals:   02/28/23 1917 03/01/23 0531 03/01/23 0555 03/01/23 0900  BP: (!) 171/90 (!) 194/101 (!) 178/84 (!) 189/87   03/01/23 0914 03/01/23 0930 03/01/23 1000 03/01/23 1030  BP: (!) 169/90 (!) 184/81 (!) 190/104 (!) 170/98   03/01/23 1100 03/01/23 1130 03/01/23 1200 03/01/23 1230  BP: (!) 189/105 (!) 184/88 (!) 163/91 (!) 175/92    9. Impaired balance: continue PT   10 ESRD: Schedule HD at the end of the day on TTS. 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   11. Seizures hx: Continue Keppra 500mg  daily and additional 250 mg on TTS after HD  12. Anemia of chronic disease: Stable. Serial CBC with HD. Hgb reviewed and was stable at 11.4 on 7/6. Stable at 10.6 today 03/01/23  13.  Dyslipidemia: HDL-29  Trig-168. Continue Lipitor 40mg  QD.  14. L hip pain: continue kpad.  -7/8 XR reviewed and shows mild OA    LOS: 8 days A FACE TO FACE EVALUATION WAS PERFORMED  [address] 03/01/2023, 1:09 PM

## 2023-03-01 NOTE — Progress Notes (Signed)
   03/01/23 1345  Vitals  Temp 97.9 F (36.6 C)  Temp Source Oral  BP (!) 167/95  MAP (mmHg) 116  BP Location Left Arm  BP Method Automatic  Patient Position (if appropriate) Lying  Pulse Rate 70  ECG Heart Rate 70  Resp 12  Oxygen Therapy  SpO2 97 %  O2 Device Room Air  During Treatment Monitoring  Intra-Hemodialysis Comments Tx completed  Post Treatment  Dialyzer Clearance Clear  Duration of HD Treatment -hour(s) 4 hour(s)  Liters Processed 96  Fluid Removed (mL) 3000 mL  Tolerated HD Treatment Yes  Post-Hemodialysis Comments Tx complete without complications.  UF goal met without difficulty. VSS post treatment and patient without complaint.  Fistula / Graft Left Upper arm Arteriovenous fistula  No placement date or time found.   Placed prior to admission: Yes  Orientation: Left  Access Location: Upper arm  Access Type: Arteriovenous fistula  Site Condition No complications  Fistula / Graft Assessment Present;Thrill;Bruit  Status Deaccessed  Drainage Description None (Dressing clean, dry and intact.)

## 2023-03-01 NOTE — Progress Notes (Signed)
Speech Language Pathology Discharge Summary  Patient Details  Name: Tanner Rollins MRN: [id number] Date of Birth: 07-04-1951  Date of Discharge from SLP service:February 28, 2023  Patient has met 3 of 3 long term goals.  Patient to discharge at overall Supervision level.   Reasons goals not met: N/A   Clinical Impression/Discharge Summary: Patient has made excellent gains and has met 3 of 3 LTGs this admission. Currently, patient requires intermittent supervision level verbal cues to complete functional and familiar tasks safely in regards to problem solving and recall with use of compensatory strategies. Patient education is complete and patient will discharge home with intermittent supervision from family. Patient would benefit from f/u SLP services to maximize his cognitive functioning and overall functional independence.   Care Partner:  Caregiver Able to Provide Assistance: Yes  Type of Caregiver Assistance: Cognitive  Recommendation:  Outpatient SLP (Intermittent supervision)  Rationale for SLP Follow Up: Maximize cognitive function and independence   Equipment: N/A   Reasons for discharge: Discharged from hospital;Treatment goals met   Patient/Family Agrees with Progress Made and Goals Achieved: Yes    [name] 03/01/2023, 6:49 AM

## 2023-03-01 NOTE — Progress Notes (Signed)
Subjective: Seen on hemodialysis /no complaints, thinks dc soon from cir   Objective Vital signs in last 24 hours: Vitals:   03/01/23 0914 03/01/23 0928 03/01/23 0930 03/01/23 1000  BP: (!) 169/90  (!) 184/81 (!) 190/104  Pulse: 75  76 71  Resp:   14 11  Temp:      TempSrc:      SpO2:   99% 96%  Weight:  72.8 kg    Height:       Weight change: 1.3 kg  Physical Exam: General:  alert elderly male NAD on hd  Heart: RRR ,no mrg  Lungs: CTA   Bilat , non labored Breathing  Abdomen:  nabs, SOFT , NT, ND  Extremities:  no pedal edema  Dialysis Access: patent on hd LA AVF     Dialysis Orders: TTS at [facility] 4hr, 400/800, EDW 70kg, 2K/2Ca, AVF, 15g needles, - heparin 3000 units IV bolus - no recent ESA, Hgb typically > 11 - no VDRA at the moment, prev on Hectoral - held as of 6/29 for Ca 10.2   Problem/Plan: Acute R CVA (2 areas): Neuro consulted  now in CIR possible dc next wk, started on Keppra.On Eliquis + HL. Appears to be back to baseline now.  ESRD:  Usual TTS schedule -  No heparin, low UFG. Next HD on 02/25/2023. Hypertension/volume: BP high - permissive HTN. No edema. Goal to keep SBP>105 Anemia: Hgb  11.9  no ESA for now Metabolic bone disease: PO4 at goal. Continue binders. Corrected Ca+ high. Hold VDRA.  Nutrition:  Alb low, continue supplement.  pA-Fib: On Eliquis + amiodarone   Lenny Pastel, PA-C [facility] [phone] 03/01/2023,10:21 AM  LOS: 8 days   Labs: Basic Metabolic Panel: Recent Labs  Lab 02/22/23 1028 02/25/23 1150 03/01/23 0900  NA 135 135 137  K 5.1 4.0 4.4  CL 94* 92* 95*  CO2 26 26 24   GLUCOSE 94 85 150*  BUN 47* 35* 28*  CREATININE 13.87* 13.27* 10.71*  CALCIUM 9.5 10.0 9.8  PHOS 5.5* 4.6 4.0   Liver Function Tests: Recent Labs  Lab 02/22/23 1028 02/25/23 1150 03/01/23 0900  ALBUMIN 3.2* 3.4* 3.2*   No results for input(s): "LIPASE", "AMYLASE" in the last 168 hours. No results for input(s): "AMMONIA" in  the last 168 hours. CBC: Recent Labs  Lab 02/22/23 1028 02/25/23 1150 03/01/23 0900  WBC 5.6 4.8 5.7  HGB 11.4* 11.9* 10.5*  HCT 34.4* 36.0* 32.4*  MCV 96.4 96.3 95.9  PLT 228 256 210   Cardiac Enzymes: No results for input(s): "CKTOTAL", "CKMB", "CKMBINDEX", "TROPONINI" in the last 168 hours. CBG: No results for input(s): "GLUCAP" in the last 168 hours.  Studies/Results: No results found. Medications:   (feeding supplement) PROSource Plus  30 mL Oral BID BM   amiodarone  200 mg Oral Daily   apixaban  5 mg Oral BID   atorvastatin  40 mg Oral Daily   diltiazem  120 mg Oral Daily   levETIRAcetam  250 mg Oral Once per day on Tue Thu Sat   levETIRAcetam  500 mg Oral Daily   melatonin  5 mg Oral QHS   pentafluoroprop-tetrafluoroeth       sevelamer carbonate  1,600 mg Oral TID WC   Vitamin D (Ergocalciferol)  50,000 Units Oral Q7 days

## 2023-03-02 ENCOUNTER — Inpatient Hospital Stay (HOSPITAL_COMMUNITY): Payer: No Typology Code available for payment source

## 2023-03-02 ENCOUNTER — Inpatient Hospital Stay (HOSPITAL_COMMUNITY)
Admission: AD | Admit: 2023-03-02 | Discharge: 2023-03-20 | DRG: 064 | Disposition: E | Payer: No Typology Code available for payment source | Attending: Internal Medicine | Admitting: Internal Medicine

## 2023-03-02 ENCOUNTER — Encounter (HOSPITAL_COMMUNITY): Payer: No Typology Code available for payment source

## 2023-03-02 DIAGNOSIS — I619 Nontraumatic intracerebral hemorrhage, unspecified: Secondary | ICD-10-CM | POA: Diagnosis present

## 2023-03-02 DIAGNOSIS — Y95 Nosocomial condition: Secondary | ICD-10-CM | POA: Diagnosis not present

## 2023-03-02 DIAGNOSIS — I612 Nontraumatic intracerebral hemorrhage in hemisphere, unspecified: Secondary | ICD-10-CM

## 2023-03-02 DIAGNOSIS — R001 Bradycardia, unspecified: Secondary | ICD-10-CM | POA: Diagnosis present

## 2023-03-02 DIAGNOSIS — E785 Hyperlipidemia, unspecified: Secondary | ICD-10-CM | POA: Diagnosis present

## 2023-03-02 DIAGNOSIS — R4182 Altered mental status, unspecified: Secondary | ICD-10-CM

## 2023-03-02 DIAGNOSIS — R680 Hypothermia, not associated with low environmental temperature: Secondary | ICD-10-CM | POA: Diagnosis not present

## 2023-03-02 DIAGNOSIS — G40801 Other epilepsy, not intractable, with status epilepticus: Secondary | ICD-10-CM | POA: Diagnosis present

## 2023-03-02 DIAGNOSIS — G928 Other toxic encephalopathy: Secondary | ICD-10-CM | POA: Diagnosis not present

## 2023-03-02 DIAGNOSIS — E875 Hyperkalemia: Secondary | ICD-10-CM | POA: Diagnosis present

## 2023-03-02 DIAGNOSIS — I611 Nontraumatic intracerebral hemorrhage in hemisphere, cortical: Secondary | ICD-10-CM | POA: Diagnosis present

## 2023-03-02 DIAGNOSIS — E854 Organ-limited amyloidosis: Secondary | ICD-10-CM | POA: Diagnosis present

## 2023-03-02 DIAGNOSIS — T82818A Embolism of vascular prosthetic devices, implants and grafts, initial encounter: Secondary | ICD-10-CM | POA: Diagnosis present

## 2023-03-02 DIAGNOSIS — R579 Shock, unspecified: Secondary | ICD-10-CM | POA: Diagnosis not present

## 2023-03-02 DIAGNOSIS — G936 Cerebral edema: Secondary | ICD-10-CM | POA: Diagnosis present

## 2023-03-02 DIAGNOSIS — R569 Unspecified convulsions: Secondary | ICD-10-CM

## 2023-03-02 DIAGNOSIS — G935 Compression of brain: Secondary | ICD-10-CM | POA: Diagnosis present

## 2023-03-02 DIAGNOSIS — D6832 Hemorrhagic disorder due to extrinsic circulating anticoagulants: Secondary | ICD-10-CM | POA: Diagnosis present

## 2023-03-02 DIAGNOSIS — I480 Paroxysmal atrial fibrillation: Secondary | ICD-10-CM | POA: Diagnosis present

## 2023-03-02 DIAGNOSIS — I680 Cerebral amyloid angiopathy: Secondary | ICD-10-CM | POA: Diagnosis present

## 2023-03-02 DIAGNOSIS — E878 Other disorders of electrolyte and fluid balance, not elsewhere classified: Secondary | ICD-10-CM | POA: Diagnosis present

## 2023-03-02 DIAGNOSIS — G9389 Other specified disorders of brain: Secondary | ICD-10-CM | POA: Diagnosis present

## 2023-03-02 DIAGNOSIS — Z6821 Body mass index (BMI) 21.0-21.9, adult: Secondary | ICD-10-CM

## 2023-03-02 DIAGNOSIS — Z66 Do not resuscitate: Secondary | ICD-10-CM | POA: Diagnosis not present

## 2023-03-02 DIAGNOSIS — D631 Anemia in chronic kidney disease: Secondary | ICD-10-CM | POA: Diagnosis present

## 2023-03-02 DIAGNOSIS — G9349 Other encephalopathy: Secondary | ICD-10-CM | POA: Diagnosis present

## 2023-03-02 DIAGNOSIS — N400 Enlarged prostate without lower urinary tract symptoms: Secondary | ICD-10-CM | POA: Diagnosis present

## 2023-03-02 DIAGNOSIS — J690 Pneumonitis due to inhalation of food and vomit: Secondary | ICD-10-CM | POA: Diagnosis not present

## 2023-03-02 DIAGNOSIS — H5340 Unspecified visual field defects: Secondary | ICD-10-CM | POA: Diagnosis present

## 2023-03-02 DIAGNOSIS — Z515 Encounter for palliative care: Secondary | ICD-10-CM

## 2023-03-02 DIAGNOSIS — Z992 Dependence on renal dialysis: Secondary | ICD-10-CM | POA: Diagnosis not present

## 2023-03-02 DIAGNOSIS — Y832 Surgical operation with anastomosis, bypass or graft as the cause of abnormal reaction of the patient, or of later complication, without mention of misadventure at the time of the procedure: Secondary | ICD-10-CM | POA: Diagnosis present

## 2023-03-02 DIAGNOSIS — R471 Dysarthria and anarthria: Secondary | ICD-10-CM | POA: Diagnosis present

## 2023-03-02 DIAGNOSIS — R4701 Aphasia: Secondary | ICD-10-CM | POA: Diagnosis present

## 2023-03-02 DIAGNOSIS — N250 Renal osteodystrophy: Secondary | ICD-10-CM | POA: Diagnosis present

## 2023-03-02 DIAGNOSIS — Z7901 Long term (current) use of anticoagulants: Secondary | ICD-10-CM

## 2023-03-02 DIAGNOSIS — R64 Cachexia: Secondary | ICD-10-CM | POA: Diagnosis present

## 2023-03-02 DIAGNOSIS — I120 Hypertensive chronic kidney disease with stage 5 chronic kidney disease or end stage renal disease: Secondary | ICD-10-CM | POA: Diagnosis present

## 2023-03-02 DIAGNOSIS — T4275XA Adverse effect of unspecified antiepileptic and sedative-hypnotic drugs, initial encounter: Secondary | ICD-10-CM | POA: Diagnosis not present

## 2023-03-02 DIAGNOSIS — N186 End stage renal disease: Secondary | ICD-10-CM | POA: Diagnosis present

## 2023-03-02 DIAGNOSIS — E43 Unspecified severe protein-calorie malnutrition: Secondary | ICD-10-CM | POA: Insufficient documentation

## 2023-03-02 DIAGNOSIS — Z87891 Personal history of nicotine dependence: Secondary | ICD-10-CM

## 2023-03-02 DIAGNOSIS — I639 Cerebral infarction, unspecified: Secondary | ICD-10-CM | POA: Diagnosis not present

## 2023-03-02 DIAGNOSIS — T45515A Adverse effect of anticoagulants, initial encounter: Secondary | ICD-10-CM | POA: Diagnosis present

## 2023-03-02 DIAGNOSIS — I10 Essential (primary) hypertension: Secondary | ICD-10-CM | POA: Diagnosis not present

## 2023-03-02 DIAGNOSIS — R972 Elevated prostate specific antigen [PSA]: Secondary | ICD-10-CM | POA: Diagnosis present

## 2023-03-02 DIAGNOSIS — R1310 Dysphagia, unspecified: Secondary | ICD-10-CM | POA: Diagnosis present

## 2023-03-02 DIAGNOSIS — Z79899 Other long term (current) drug therapy: Secondary | ICD-10-CM

## 2023-03-02 DIAGNOSIS — I6381 Other cerebral infarction due to occlusion or stenosis of small artery: Secondary | ICD-10-CM | POA: Diagnosis present

## 2023-03-02 DIAGNOSIS — R4702 Dysphasia: Secondary | ICD-10-CM | POA: Diagnosis present

## 2023-03-02 DIAGNOSIS — Z7189 Other specified counseling: Secondary | ICD-10-CM | POA: Diagnosis not present

## 2023-03-02 DIAGNOSIS — R29810 Facial weakness: Secondary | ICD-10-CM | POA: Diagnosis present

## 2023-03-02 DIAGNOSIS — D72828 Other elevated white blood cell count: Secondary | ICD-10-CM | POA: Diagnosis not present

## 2023-03-02 LAB — COMPREHENSIVE METABOLIC PANEL
ALT: 11 U/L (ref 0–44)
AST: 14 U/L — ABNORMAL LOW (ref 15–41)
Albumin: 3.9 g/dL (ref 3.5–5.0)
Alkaline Phosphatase: 101 U/L (ref 38–126)
Anion gap: 19 — ABNORMAL HIGH (ref 5–15)
BUN: 28 mg/dL — ABNORMAL HIGH (ref 8–23)
CO2: 23 mmol/L (ref 22–32)
Calcium: 10.1 mg/dL (ref 8.9–10.3)
Chloride: 92 mmol/L — ABNORMAL LOW (ref 98–111)
Creatinine, Ser: 8.6 mg/dL — ABNORMAL HIGH (ref 0.61–1.24)
GFR, Estimated: 6 mL/min — ABNORMAL LOW (ref >60)
Glucose, Bld: 93 mg/dL (ref 70–99)
Potassium: 4.4 mmol/L (ref 3.5–5.1)
Sodium: 134 mmol/L — ABNORMAL LOW (ref 135–145)
Total Bilirubin: 0.5 mg/dL (ref 0.3–1.2)
Total Protein: 8 g/dL (ref 6.5–8.1)

## 2023-03-02 LAB — CBC WITH DIFFERENTIAL/PLATELET
Abs Immature Granulocytes: 0.01 10*3/uL (ref 0.00–0.07)
Basophils Absolute: 0.1 10*3/uL (ref 0.0–0.1)
Basophils Relative: 1 %
Eosinophils Absolute: 0.1 10*3/uL (ref 0.0–0.5)
Eosinophils Relative: 1 %
HCT: 41.2 % (ref 39.0–52.0)
Hemoglobin: 13.5 g/dL (ref 13.0–17.0)
Immature Granulocytes: 0 %
Lymphocytes Relative: 22 %
Lymphs Abs: 1.5 10*3/uL (ref 0.7–4.0)
MCH: 31.8 pg (ref 26.0–34.0)
MCHC: 32.8 g/dL (ref 30.0–36.0)
MCV: 97.2 fL (ref 80.0–100.0)
Monocytes Absolute: 0.8 10*3/uL (ref 0.1–1.0)
Monocytes Relative: 12 %
Neutro Abs: 4.4 10*3/uL (ref 1.7–7.7)
Neutrophils Relative %: 64 %
Platelets: 236 10*3/uL (ref 150–400)
RBC: 4.24 MIL/uL (ref 4.22–5.81)
RDW: 14.4 % (ref 11.5–15.5)
WBC: 6.8 10*3/uL (ref 4.0–10.5)
nRBC: 0 % (ref 0.0–0.2)

## 2023-03-02 LAB — PROTIME-INR
INR: 1.4 — ABNORMAL HIGH (ref 0.8–1.2)
Prothrombin Time: 17.6 seconds — ABNORMAL HIGH (ref 11.4–15.2)

## 2023-03-02 LAB — GLUCOSE, CAPILLARY: Glucose-Capillary: 89 mg/dL (ref 70–99)

## 2023-03-02 MED ORDER — LORAZEPAM 2 MG/ML IJ SOLN
2.0000 mg | INTRAMUSCULAR | Status: DC | PRN
  Administered 2023-03-02 (×2): 2 mg via INTRAVENOUS
  Filled 2023-03-02 (×3): qty 1

## 2023-03-02 MED ORDER — DOCUSATE SODIUM 50 MG/5ML PO LIQD
100.0000 mg | Freq: Two times a day (BID) | ORAL | Status: DC
  Administered 2023-03-03 – 2023-03-05 (×4): 100 mg
  Filled 2023-03-02 (×4): qty 10

## 2023-03-02 MED ORDER — LORAZEPAM 2 MG/ML IJ SOLN: 1.0000 mg | Freq: Once | INTRAMUSCULAR | Status: DC | PRN

## 2023-03-02 MED ORDER — ACETAMINOPHEN 325 MG PO TABS: 650.0000 mg | ORAL_TABLET | ORAL | Status: DC | PRN

## 2023-03-02 MED ORDER — STROKE: EARLY STAGES OF RECOVERY BOOK: Freq: Once | Status: DC

## 2023-03-02 MED ORDER — PANTOPRAZOLE SODIUM 40 MG IV SOLR
40.0000 mg | Freq: Every day | INTRAVENOUS | Status: DC
  Administered 2023-03-02: 40 mg via INTRAVENOUS

## 2023-03-02 MED ORDER — SODIUM CHLORIDE 0.9 % IV SOLN
20.0000 mg/kg | Freq: Once | INTRAVENOUS | Status: DC
  Filled 2023-03-02: qty 28.16

## 2023-03-02 MED ORDER — LEVETIRACETAM IN NACL 500 MG/100ML IV SOLN: 500.0000 mg | Freq: Every day | INTRAVENOUS | Status: DC

## 2023-03-02 MED ORDER — PANTOPRAZOLE SODIUM 40 MG IV SOLR: 40.0000 mg | Freq: Every day | INTRAVENOUS | Status: DC

## 2023-03-02 MED ORDER — FAMOTIDINE 20 MG PO TABS
20.0000 mg | ORAL_TABLET | Freq: Two times a day (BID) | ORAL | Status: DC
  Administered 2023-03-03: 20 mg
  Filled 2023-03-02: qty 1

## 2023-03-02 MED ORDER — LORAZEPAM 2 MG/ML IJ SOLN
2.0000 mg | Freq: Once | INTRAMUSCULAR | Status: DC
Start: 2023-03-02 — End: 2023-03-02

## 2023-03-02 MED ORDER — LABETALOL HCL 5 MG/ML IV SOLN
10.0000 mg | Freq: Once | INTRAVENOUS | Status: AC
  Administered 2023-03-02: 10 mg via INTRAVENOUS

## 2023-03-02 MED ORDER — MIDAZOLAM HCL 2 MG/2ML IJ SOLN: 1.0000 mg | INTRAMUSCULAR | Status: DC | PRN

## 2023-03-02 MED ORDER — SUCCINYLCHOLINE CHLORIDE 200 MG/10ML IV SOSY
PREFILLED_SYRINGE | INTRAVENOUS | Status: AC
  Filled 2023-03-02: qty 10

## 2023-03-02 MED ORDER — ETOMIDATE 2 MG/ML IV SOLN
INTRAVENOUS | Status: AC
  Filled 2023-03-02: qty 20

## 2023-03-02 MED ORDER — PROPOFOL 1000 MG/100ML IV EMUL
0.0000 ug/kg/min | INTRAVENOUS | Status: DC
  Administered 2023-03-03: 20 ug/kg/min via INTRAVENOUS
  Administered 2023-03-03: 40 ug/kg/min via INTRAVENOUS
  Filled 2023-03-02 (×2): qty 100

## 2023-03-02 MED ORDER — CLEVIDIPINE BUTYRATE 0.5 MG/ML IV EMUL
0.0000 mg/h | INTRAVENOUS | Status: DC
Start: 2023-03-02 — End: 2023-03-02

## 2023-03-02 MED ORDER — CLEVIDIPINE BUTYRATE 0.5 MG/ML IV EMUL
0.0000 mg/h | INTRAVENOUS | Status: DC
  Administered 2023-03-02: 2 mg/h via INTRAVENOUS

## 2023-03-02 MED ORDER — LEVETIRACETAM 250 MG PO TABS: 500.0000 mg | ORAL_TABLET | Freq: Every day | ORAL | Status: DC

## 2023-03-02 MED ORDER — CLEVIDIPINE BUTYRATE 0.5 MG/ML IV EMUL
0.0000 mg/h | INTRAVENOUS | Status: DC
  Administered 2023-03-02: 4 mg/h via INTRAVENOUS
  Administered 2023-03-02 (×2): 12 mg/h via INTRAVENOUS
  Administered 2023-03-02: 10 mg/h via INTRAVENOUS
  Filled 2023-03-02 (×3): qty 50

## 2023-03-02 MED ORDER — ROCURONIUM BROMIDE 10 MG/ML (PF) SYRINGE
PREFILLED_SYRINGE | INTRAVENOUS | Status: AC
  Filled 2023-03-02: qty 10

## 2023-03-02 MED ORDER — STROKE: EARLY STAGES OF RECOVERY BOOK
Freq: Once | Status: AC
  Filled 2023-03-02: qty 1

## 2023-03-02 MED ORDER — LEVETIRACETAM IN NACL 500 MG/100ML IV SOLN: 500.0000 mg | INTRAVENOUS | Status: DC

## 2023-03-02 MED ORDER — FENTANYL CITRATE PF 50 MCG/ML IJ SOSY: 25.0000 ug | PREFILLED_SYRINGE | INTRAMUSCULAR | Status: DC | PRN

## 2023-03-02 MED ORDER — FENTANYL CITRATE PF 50 MCG/ML IJ SOSY
PREFILLED_SYRINGE | INTRAMUSCULAR | Status: AC
  Filled 2023-03-02: qty 2

## 2023-03-02 MED ORDER — SENNOSIDES-DOCUSATE SODIUM 8.6-50 MG PO TABS
1.0000 | ORAL_TABLET | Freq: Two times a day (BID) | ORAL | Status: DC
  Administered 2023-03-03 – 2023-03-07 (×6): 1
  Filled 2023-03-02 (×8): qty 1

## 2023-03-02 MED ORDER — VALPROATE SODIUM 100 MG/ML IV SOLN
350.0000 mg | Freq: Three times a day (TID) | INTRAVENOUS | Status: DC
  Administered 2023-03-03 – 2023-03-06 (×11): 350 mg via INTRAVENOUS
  Filled 2023-03-02 (×14): qty 3.5

## 2023-03-02 MED ORDER — SENNOSIDES-DOCUSATE SODIUM 8.6-50 MG PO TABS: 1.0000 | ORAL_TABLET | Freq: Two times a day (BID) | ORAL | Status: DC

## 2023-03-02 MED ORDER — LEVETIRACETAM IN NACL 500 MG/100ML IV SOLN
500.0000 mg | Freq: Two times a day (BID) | INTRAVENOUS | Status: DC
  Administered 2023-03-02: 500 mg via INTRAVENOUS
  Filled 2023-03-02: qty 100

## 2023-03-02 MED ORDER — ACETAMINOPHEN 160 MG/5ML PO SOLN: 650.0000 mg | ORAL | Status: DC | PRN

## 2023-03-02 MED ORDER — ACETAMINOPHEN 650 MG RE SUPP: 650.0000 mg | RECTAL | Status: DC | PRN

## 2023-03-02 MED ORDER — SODIUM CHLORIDE 0.9 % IV SOLN
250.0000 mg | INTRAVENOUS | Status: DC
  Filled 2023-03-02: qty 2.5

## 2023-03-02 MED ORDER — SODIUM CHLORIDE 0.9 % IV SOLN: 250.0000 mg | INTRAVENOUS | Status: DC | PRN

## 2023-03-02 MED ORDER — SODIUM CHLORIDE 0.9 % IV SOLN
200.0000 mg | Freq: Two times a day (BID) | INTRAVENOUS | Status: DC
  Administered 2023-03-02 – 2023-03-05 (×7): 200 mg via INTRAVENOUS
  Filled 2023-03-02 (×9): qty 20

## 2023-03-02 MED ORDER — VALPROATE SODIUM 100 MG/ML IV SOLN
2800.0000 mg | Freq: Once | INTRAVENOUS | Status: AC
  Administered 2023-03-02: 2800 mg via INTRAVENOUS
  Filled 2023-03-02: qty 28

## 2023-03-02 MED ORDER — EMPTY CONTAINERS FLEXIBLE MISC
900.0000 mg | Freq: Once | Status: AC
  Administered 2023-03-02: 900 mg via INTRAVENOUS
  Filled 2023-03-02: qty 90

## 2023-03-02 MED ORDER — ACETAMINOPHEN 160 MG/5ML PO SOLN
650.0000 mg | ORAL | Status: DC | PRN
  Administered 2023-03-06 – 2023-03-07 (×5): 650 mg
  Filled 2023-03-02 (×5): qty 20.3

## 2023-03-02 MED ORDER — MIDAZOLAM HCL 2 MG/2ML IJ SOLN
INTRAMUSCULAR | Status: AC
  Filled 2023-03-02: qty 2

## 2023-03-02 MED ORDER — SODIUM CHLORIDE 0.9 % IV SOLN
1430.0000 mg | Freq: Once | INTRAVENOUS | Status: AC
  Administered 2023-03-03: 1430 mg via INTRAVENOUS
  Filled 2023-03-02: qty 11

## 2023-03-02 MED ORDER — LORAZEPAM 2 MG/ML IJ SOLN: 2.0000 mg | Freq: Once | INTRAMUSCULAR | Status: DC

## 2023-03-02 MED ORDER — PHENYLEPHRINE 80 MCG/ML (10ML) SYRINGE FOR IV PUSH (FOR BLOOD PRESSURE SUPPORT)
PREFILLED_SYRINGE | INTRAVENOUS | Status: AC
  Filled 2023-03-02: qty 10

## 2023-03-02 MED ORDER — LABETALOL HCL 5 MG/ML IV SOLN: 10.0000 mg | INTRAVENOUS | Status: DC | PRN

## 2023-03-02 MED ORDER — POLYETHYLENE GLYCOL 3350 17 G PO PACK
17.0000 g | PACK | Freq: Every day | ORAL | Status: DC
  Administered 2023-03-07: 17 g
  Filled 2023-03-02: qty 1

## 2023-03-02 MED ORDER — LABETALOL HCL 5 MG/ML IV SOLN
20.0000 mg | INTRAVENOUS | Status: DC | PRN
  Administered 2023-03-02: 20 mg via INTRAVENOUS
  Filled 2023-03-02: qty 4

## 2023-03-02 MED ORDER — LEVETIRACETAM IN NACL 500 MG/100ML IV SOLN
500.0000 mg | Freq: Two times a day (BID) | INTRAVENOUS | Status: DC
  Administered 2023-03-02 – 2023-03-04 (×5): 500 mg via INTRAVENOUS
  Filled 2023-03-02 (×5): qty 100

## 2023-03-02 MED ORDER — KETAMINE HCL 50 MG/5ML IJ SOSY
PREFILLED_SYRINGE | INTRAMUSCULAR | Status: AC
  Filled 2023-03-02: qty 10

## 2023-03-02 NOTE — H&P (Signed)
Neurology H&P  CC: Altered mental status   History is obtained from: Family   HPI: Tanner Rollins is a 72 y.o. male with a past medical history HTN, ESRD- HD MWF, BPH with elevated PSA (being worked up by [place]), A Fib on Eliquis, medication non-compliance who was admitted on 02/18/23 with left sided weakness with decreased sensation, was disoriented and noted to have intermittent LUE flaccidity with intermittent right gaze deviation. He had seizure activity noted during CT and keppra was resumed. 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   He was continued on keppra and eliquis and was being prepared for discharge, leaving the hospital at 0950 today. He then was driving home with his sister. They stopped at [facility] to load his phone card and she noticed he was acting disoriented. They decided to return to the hospital and he was brought up to 52M by his family and a code stroke was activated. CT Head showed an acute parenchymal hemorrhage in the left parietal lobe measuring 2.2 x 1.4 x 2.0 cm. There is some surrounding vasogenic edema and involvement of the sulci. He was then admitted to [facility] under the Neurology service for management of the acute hemorrhage.   Regarding the events prior to the Code Stroke being called, per PM&R progress note: "pt apparently left with family without informing nursing, I received a call around 10:45am from nursing staff informing me of this and nursing stated they had called the family to tell them to come back for his medications; was told by Sharia Reeve RN that pt would be discharged from the system. I then received a call at 11:40am stating that the pt's family brought the patient back and he was altered, code stroke was called."  1434- received a message from RN and EEG that there may be seizure  activity, LTM ordered. Will load with depakote 40mg /kg and start depakote 5mg /kg TID.   LKW: 0950 TNK given?: No, ICH IR Thrombectomy? No, ICH  ICH Score: 0  NIHSS:  1a Level of Conscious.: 0  1b LOC Questions: 2 1c LOC Commands: 2 2 Best Gaze: 2 3 Visual: 2 4 Facial Palsy: 1 5a Motor Arm - left: 2 5b Motor Arm - Right: 2 6a Motor Leg - Left: 3 6b Motor Leg - Right: 2 7 Limb Ataxia: 0 8 Sensory: 1 9 Best Language: 2 10 Dysarthria: 1 11 Extinct and Inattention.: 1 TOTAL: 22     ROS:  Unable to obtain due to altered mental status.   Past Medical History:  Diagnosis Date   ESRD on hemodialysis (HCC)    [facility]   Hypertension    PAF (paroxysmal atrial fibrillation) (HCC)    Seizure disorder (HCC)      No family history on file.   Social History:  reports that he quit smoking about 33 years ago. His smoking use included cigarettes. He has never used smokeless tobacco. He reports that he does not drink alcohol and does not use drugs.   Prior to Admission medications   Medication Sig Start Date End Date Taking? Authorizing Provider  acetaminophen (TYLENOL) 500 MG tablet Take 1 tablet (500 mg total) by mouth every 6 (six) hours as needed for moderate pain or headache. 02/28/23   Love, Evlyn Kanner, PA-C  amiodarone (PACERONE) 200 MG tablet Take 1 tablet (200 mg total) by mouth  daily. 02/28/23 03/30/23  Love, Evlyn Kanner, PA-C  apixaban (ELIQUIS) 5 MG TABS tablet Take 1 tablet (5 mg total) by mouth 2 (two) times daily. 02/28/23   Love, Evlyn Kanner, PA-C  atorvastatin (LIPITOR) 40 MG tablet Take 1 tablet (40 mg total) by mouth daily. 02/28/23 03/30/23  Love, Evlyn Kanner, PA-C  diltiazem (CARDIZEM CD) 120 MG 24 hr capsule Take 1 capsule (120 mg total) by mouth daily. 03/01/23   Love, Evlyn Kanner, PA-C  levETIRAcetam (KEPPRA) 500 MG tablet Take 1 tablet (500 mg total) by mouth daily. Take addition 250 mg (1/2 tablet) after HD on TTS 03/01/23   Love, Evlyn Kanner, PA-C  melatonin 5 MG TABS Take  1 tablet (5 mg total) by mouth at bedtime. 02/28/23   Love, Evlyn Kanner, PA-C  Multiple Vitamin (MULTIVITAMIN WITH MINERALS) TABS tablet Take 1 tablet by mouth daily.    [provider]  sevelamer carbonate (RENVELA) 800 MG tablet Take 2 tablets (1,600 mg total) by mouth 3 (three) times daily with meals. 02/28/23   Love, Evlyn Kanner, PA-C  Vitamin D, Ergocalciferol, (DRISDOL) 1.25 MG (50000 UNIT) CAPS capsule Take 1 capsule (50,000 Units total) by mouth every 7 (seven) days. 03/04/23   Jacquelynn Cree, PA-C     Exam: Current vital signs: BP (!) 172/85   Pulse 68   Resp 16   SpO2 100%    Physical Exam  Constitutional: Appears well-developed and well-nourished.  Psych: Affect appropriate to situation Eyes: No scleral injection HENT: No OP obstrucion Head: Normocephalic.  Cardiovascular: Normal rate and regular rhythm.  Respiratory: Effort normal and breath sounds normal to anterior ascultation GI: Soft.  No distension. There is no tenderness.  Skin: WDI  Neuro: Mental Status: Patient is awake, tracking examiner but not following commands. Dense receptive and expressive aphasia. No agitation.  Cranial Nerves: II: Blink to threat is inconsistent, exam is concerning for hemianopia. Pupils are equal, round, and reactive to light.   III,IV, VI: Seems to have a right gaze preference  V: left facial droop  VII: Face is symmetric resting and smiling.  VIII: Hearing is intact to voice X: Palate is midline and palate elevates symmetrically, phonation intact XI: Shoulder shrug is symmetric. XII: tongue is midline without atrophy or fasciculations.  Motor: Tone is normal. Bulk is normal. Elevates all extremities antigravity with left arm appearing to be weaker than right Left leg weaker than right Does not maintain elevation of any extremity due to not following commands Sensory: Withdraws to pain more briskly on the right than the left  Cerebellar: Unable to complete    I have  reviewed labs in epic and the pertinent results are: CBC pending  BMP pending INR/PT pending  I have reviewed the images obtained: CT Head 1. Acute parenchymal hemorrhage in the left parietal lobe measures 2.2 x 1.4 x 2.0 cm. There is some surrounding vasogenic edema and involvement of the sulci. No midline shift is present.  2. Remote encephalomalacia of the right parietal lobe is again noted. 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 3. Multiple remote lacunar infarcts within the basal ganglia and thalami. 4. Atherosclerosis.  Assessment: 72 year old male with a history new onset seizures recently started on renal-dose Keppra, atrial fibrillation on Eliquis and ESRD on HD, now with acute left parietal lobe hemorrhage on CT scan obtained after he was noted to become acutely dysphasic after leaving the hospital without notifying staff, then  returning back to the Rehab floor with sister after the above new deficits were noted.  - Exam with dense receptive and expressive aphasia, LEFT sided weakness and equivocal visual field cut.  - CT with acute left parietal lobe hemorrhage as outlined in the Radiology report above  Plan: # Cortical ICH, nontraumatic Acuity: Acute Laterality: Left Current suspected etiology: hypertensive, anticoagulant  Treatment: BP control, andexxa -Admit to Neuro ICU -ICH Score:0 -ICH Volume: 3ml -Etiology for the acute lobar hemorrhage is felt to be secondary to amyloid angiopathy due to the distribution and sizes of the multiple chronic microhemorrhages seen on MRI dated 7/2 -CT head without contrast 6 hours after admission  -BP control goal SYS< 130-150  for first 24 hours, then <160 -PT/OT/ST  -neuromonitoring  CNS - Monitor for development of cerebral edema -Close neuro monitoring -Repeat CT at 1700  Epilepsy with recurrent seizure activity on EEG this afternoon - Keppra 500mg  daily plus 250  post HD - Vimpat 200 mg BID - Loaded with VPA and started on scheduled VPA at 350 mg q8h - LTM EEG hooked up at 3:47 PM  Dysarthria and aphasia -New onset aphasia following ICH, consistent with the location of the hemorrhage. May also be a postictal component due to seizure demonstrated on initial LTM EEG evaluation  -NPO until cleared by speech -ST -Continue PT/OT/ST  RESP - Close monitoring in ICU  CV Essential (primary) hypertension Hypertensive Emergency -Aggressive BP control, goal SBP < 130-150 -IV Cleviprex ordered  Chronic atrial -fibrillation -Rate control -Eliquis stopped today due to acute ICH -Repeat CT in 2 weeks for consideration of restarting anticoagulation if CT is stable.   LDL 68  GI/GU ESRD -Continue dialysis -avoid nephrotoxic agents -renal consult  HEME Anemia in CKD -Monitor -transfuse for hgb < 7  Coagulopathy secondary to anticoagulation - Anticoagulation reversed with andexxa after acute ICH seen on CT today  ENDO Hgb A1C 5.3  Fluid/Electrolyte Disorders -Repeat labs -Trend -Per nephrology  ID -NPO -Monitor  Nutrition NPO  Prophylaxis DVT: SCDs GI: Protonix Bowel: Senna  Dispo: IP Rehab   Diet: NPO until cleared by speech  Code Status: Full Code    THE FOLLOWING WERE PRESENT ON ADMISSION: CNS - Recent Ischemic Stroke, Seizures, ICH Cardiovascular - Atrial fibrillation, Hypertensive Emergency Renal -   ESRD Heme-  Coagulopathy secondary to eliquis Cancer - Elevated PSA   I have seen and examined the patient. I have formulated the assessment and plan. 72 year old male with a history new onset seizures recently started on renal-dose Keppra, atrial fibrillation on Eliquis and ESRD on HD, now with acute left parietal lobe hemorrhage on CT scan obtained after he was noted to become acutely dysphasic after leaving the hospital without notifying staff, then returning back to the Rehab floor with sister after the above new  deficits were noted. Exam reveals dense receptive and expressive aphasia. Plan as above.  Electronically signed: Dr. Caryl Pina

## 2023-03-02 NOTE — Progress Notes (Signed)
Inpatient Rehabilitation Discharge Medication Review by a Pharmacist  A complete drug regimen review was completed for this patient to identify any potential clinically significant medication issues.  High Risk Drug Classes Is patient taking? Indication by Medication  Antipsychotic No   Anticoagulant No   Antibiotic No   Opioid No   Antiplatelet No   Hypoglycemics/insulin No   Vasoactive Medication Yes Clevidipine and prn Labetalol - blood pressure control  Chemotherapy No   Other Yes Levetiracetam (now IV) - seizures Pantoprazole IV - GERD Senna-docusate - laxative PRN Lorazepam IV - seizures     Type of Medication Issue Identified Description of Issue Recommendation(s)  Drug Interaction(s) (clinically significant)     Duplicate Therapy     Allergy     No Medication Administration End Date     Incorrect Dose     Additional Drug Therapy Needed     Significant med changes from prior encounter (inform family/care partners about these prior to discharge). Change in status this morning and has now been admitted to inpatient unit.  Prior meds intended to continue now on hold:  Apixaban (reversed with Andexxa), amiodarone, atorvastatin, diltiazem CD, melatonin, pantoprazole, senna-docusate, sevelamer, vitamin D for 5 weeks, renal vitamin Prior clonidine and calcium acetate were discontinued. Modify medication regimens as indicated with current status.   Communicate changes with patient/family at discharge.  Other       Clinically significant medication issues were identified that warrant physician communication and completion of prescribed/recommended actions by midnight of the next day:  No  Pharmacist comments:    - code stroke today and now admitted to ICU.  Apixaban reversed with Andexxa.   Time spent performing this drug regimen review (minutes):  20   Tanner Rollins, [place] 03/02/2023 5:37 PM

## 2023-03-02 NOTE — Progress Notes (Signed)
Patient is alert and oriented x4, no pain at this time. 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

## 2023-03-02 NOTE — Progress Notes (Addendum)
OVERNIGHT ON CALL NOTE  S:// Received calls on multiple occasions regarding the patient.  His examination has been stable but mentation has been worsening. Issues with bradycardia, tachypnea as well as right leg twitching.  Now on LEV, VPA and loaded with LAC - still seizing. Initially trying not to burst suppress but may need to intubate and treat aggressively.  O:// Vitals:   03/02/23 2330 03/03/23 0000  BP: 121/63   Pulse: (!) 48   Resp: 15   Temp:  98 F (36.7 C)  SpO2: 100%     He appears to be obtunded Does not open eyes to voice Pupils are equal round react light No gaze preference or deviation To sternal rub, localizes briskly from the left arm, slowly from the right arm. Has intermittent right leg twitching. Grimace to noxious stimulation with minimal withdrawal  EEG review: Left posterior quadrant status epilepticus.    A// 72 year old with new onset seizures recently started on dual dose Keppra, atrial fibrillation Eliquis, ESRD on HD presenting with left parietal lobe hemorrhage on CT, which was reversed with Andexxa.  Continues to have focal seizures on the right clinically and on EEG.  Impression: ICH Status epilepticus  Recs:// CCM consult - appreciate Dr. Delton Coombes evaluating the patient Intubation if deemed appropriate Load with PHB Can do propofol if intubated - start at 20 - will titrate up as needed May need pressor support if intubated and on propofol    ADDEDNUM Intubated by CCM around 0005 hrs. Called sister's phone in chart x2 - no response. Will update when able to contact.  Plan as above.  -- Milon Dikes, MD Neurologist [facility] Pager: [phone]  Additional 50 min of CC time.

## 2023-03-02 NOTE — Consult Note (Incomplete)
NAME:  Tanner Rollins, MRN:  [id number], DOB:  01/05/1951, LOS: 0 ADMISSION DATE:  03/02/2023, CONSULTATION DATE:  03/02/2023 REFERRING MD: Dr. Wilford Corner, CHIEF COMPLAINT: Seizures, encephalopathy  History of Present Illness:  72 year old man with hypertension and end-stage renal disease on HD, seizure disorder, atrial fibrillation on Eliquis with medical noncompliance.  He was admitted 7/2-7/5 with right corona radiata CVAs.  He completed rehab and then was discharged home on 9/14 on Keppra, Eliquis.  After leaving the hospital he was noted to slump over in the car and was brought back for emergent evaluation.  Head CT showed an acute parenchymal hemorrhage and left parietal lobe 2.2 x 1.4 x 2.0 cm with some surrounding vasogenic edema.  He was admitted to 4 N., course complicated by left-sided seizure activity.  He was loaded and continued on Depakote, continued Keppra.  Subsequently continuous EEG continues to show seizures and he received Vimpat.  There was some slowing of epileptogenic discharges but seizures continued.  Some associated encephalopathy, suppressed mental status, hypotension and bradycardia.  PCCM consulted for airway protection so he can undergo bur suppression.  Pertinent  Medical History   Past Medical History:  Diagnosis Date  . ESRD on hemodialysis (HCC)    [facility]  . Hypertension   . PAF (paroxysmal atrial fibrillation) (HCC)   . Seizure disorder (HCC)     Significant Hospital Events: Including procedures, antibiotic start and stop dates in addition to other pertinent events     Interim History / Subjective:  ***  Objective   Blood pressure 121/63, pulse (!) 48, temperature 98.8 F (37.1 C), temperature source Axillary, resp. rate 15, SpO2 100%.        Intake/Output Summary (Last 24 hours) at 03/02/2023 2350 Last data filed at 03/02/2023 2000 Gross per 24 hour  Intake 257.25 ml  Output --  Net 257.25 ml   There were no vitals filed for this  visit.  Examination: General: *** HENT: *** Lungs: *** Cardiovascular: *** Abdomen: *** Extremities: *** Neuro: *** GU: ***  Resolved Hospital Problem list   ***  Assessment & Plan:  ***  Best Practice (right click and "Reselect all SmartList Selections" daily)   Diet/type: {diet type:25684} DVT prophylaxis: {anticoagulation (Optional):25687} GI prophylaxis: {JY:78295} Lines: {Central Venous Access:25771} Foley:  {Central Venous Access:25691} Code Status:  {Code Status:26939} Last date of multidisciplinary goals of care discussion [***]  Labs   CBC: Recent Labs  Lab 02/25/23 1150 03/01/23 0900 03/02/23 1839  WBC 4.8 5.7 6.8  NEUTROABS  --   --  4.4  HGB 11.9* 10.5* 13.5  HCT 36.0* 32.4* 41.2  MCV 96.3 95.9 97.2  PLT 256 210 236    Basic Metabolic Panel: Recent Labs  Lab 02/25/23 1150 03/01/23 0900 03/02/23 1839  NA 135 137 134*  K 4.0 4.4 4.4  CL 92* 95* 92*  CO2 26 24 23   GLUCOSE 85 150* 93  BUN 35* 28* 28*  CREATININE 13.27* 10.71* 8.60*  CALCIUM 10.0 9.8 10.1  PHOS 4.6 4.0  --    GFR: Estimated Creatinine Clearance: 8 mL/min (A) (by C-G formula based on SCr of 8.6 mg/dL (H)). Recent Labs  Lab 02/25/23 1150 03/01/23 0900 03/02/23 1839  WBC 4.8 5.7 6.8    Liver Function Tests: Recent Labs  Lab 02/25/23 1150 03/01/23 0900 03/02/23 1839  AST  --   --  14*  ALT  --   --  11  ALKPHOS  --   --  101  BILITOT  --   --  0.5  PROT  --   --  8.0  ALBUMIN 3.4* 3.2* 3.9   No results for input(s): "LIPASE", "AMYLASE" in the last 168 hours. No results for input(s): "AMMONIA" in the last 168 hours.  ABG    Component Value Date/Time   PHART 7.393 01/24/2022 0611   PCO2ART 19.2 (LL) 01/24/2022 0611   PO2ART 60 (L) 01/24/2022 0611   HCO3 29.2 (H) 02/02/2022 1802   TCO2 29 02/18/2023 0903   ACIDBASEDEF 12.0 (H) 01/24/2022 0611   O2SAT 99 02/02/2022 1802     Coagulation Profile: Recent Labs  Lab 03/02/23 1839  INR 1.4*    Cardiac  Enzymes: No results for input(s): "CKTOTAL", "CKMB", "CKMBINDEX", "TROPONINI" in the last 168 hours.  HbA1C: Hgb A1c MFr Bld  Date/Time Value Ref Range Status  02/19/2023 04:34 AM 5.3 4.8 - 5.6 % Final    Comment:    (NOTE) Pre diabetes:          5.7%-6.4%  Diabetes:              >6.4%  Glycemic control for   <7.0% adults with diabetes   02/02/2022 09:46 PM 5.4 4.8 - 5.6 % Final    Comment:    (NOTE) Pre diabetes:          5.7%-6.4%  Diabetes:              >6.4%  Glycemic control for   <7.0% adults with diabetes     CBG: Recent Labs  Lab 03/01/23 1427 03/02/23 1124  GLUCAP 114* 89    Review of Systems:   ***  Past Medical History:  He,  has a past medical history of ESRD on hemodialysis (HCC), Hypertension, PAF (paroxysmal atrial fibrillation) (HCC), and Seizure disorder (HCC).   Surgical History:   Past Surgical History:  Procedure Laterality Date  . ANGIOPLASTY Left 06/10/2022   Procedure: Balloon VENOPLASTY;  Surgeon: Chuck Hint, MD;  Location: [facility] OR;  Service: Vascular;  Laterality: Left;  . AV FISTULA PLACEMENT Left 01/28/2022   Procedure: ARTERIOVENOUS (AV) FISTULA CREATION VS.GRAFT ARM;  Surgeon: Chuck Hint, MD;  Location[facility] OR;  Service: Vascular;  Laterality: Left;  . FISTULOGRAM Left 06/10/2022   Procedure: FISTULOGRAM;  Surgeon: Chuck Hint, MD;  Location: [facility] OR;  Service: Vascular;  Laterality: Left;  . IR FLUORO GUIDE CV LINE RIGHT  01/24/2022  . IR REMOVAL TUN CV CATH W/O FL  10/04/2022  . IR THORACENTESIS ASP PLEURAL SPACE W/IMG GUIDE  02/12/2022  . IR US GUIDE VASC ACCESS RIGHT  01/24/2022     Social History:   reports that he quit smoking about 33 years ago. His smoking use included cigarettes. He has never used smokeless tobacco. He reports that he does not drink alcohol and does not use drugs.   Family History:  His family history is not on file.   Allergies No Known Allergies   Home Medications  Prior  to Admission medications   Medication Sig Start Date End Date Taking? Authorizing Provider  acetaminophen (TYLENOL) 500 MG tablet Take 1 tablet (500 mg total) by mouth every 6 (six) hours as needed for moderate pain or headache. 02/28/23   Love, Evlyn Kanner, PA-C  amiodarone (PACERONE) 200 MG tablet Take 1 tablet (200 mg total) by mouth daily. 02/28/23 03/30/23  Love, Evlyn Kanner, PA-C  apixaban (ELIQUIS) 5 MG TABS tablet Take 1 tablet (5 mg total) by mouth 2 (two) times daily. 02/28/23   Delle Reining  S, PA-C  atorvastatin (LIPITOR) 40 MG tablet Take 1 tablet (40 mg total) by mouth daily. 02/28/23 03/30/23  Love, Evlyn Kanner, PA-C  diltiazem (CARDIZEM CD) 120 MG 24 hr capsule Take 1 capsule (120 mg total) by mouth daily. 03/01/23   Love, Evlyn Kanner, PA-C  levETIRAcetam (KEPPRA) 500 MG tablet Take 1 tablet (500 mg total) by mouth daily. Take addition 250 mg (1/2 tablet) after HD on TTS 03/01/23   Love, Evlyn Kanner, PA-C  melatonin 5 MG TABS Take 1 tablet (5 mg total) by mouth at bedtime. 02/28/23   Love, Evlyn Kanner, PA-C  Multiple Vitamin (MULTIVITAMIN WITH MINERALS) TABS tablet Take 1 tablet by mouth daily.    [provider]  sevelamer carbonate (RENVELA) 800 MG tablet Take 2 tablets (1,600 mg total) by mouth 3 (three) times daily with meals. 02/28/23   Love, Evlyn Kanner, PA-C  Vitamin D, Ergocalciferol, (DRISDOL) 1.25 MG (50000 UNIT) CAPS capsule Take 1 capsule (50,000 Units total) by mouth every 7 (seven) days. 03/04/23   Jacquelynn Cree, PA-C     Critical care time: ***

## 2023-03-02 NOTE — Progress Notes (Signed)
LTM EEG hooked up and running - no initial skin breakdown - push button tested - [facility] monitoring.  

## 2023-03-02 NOTE — Progress Notes (Addendum)
PROGRESS NOTE   Subjective/Complaints:  Pt doing well, ready to go home! Has meds at home and will be picking up new ones at [facility]-- knows that his BP needs to come down more, has meds to take and the providers during the week went over his plan with him. He will check at home and watch the trend going down.  Slept well. Denies pain. LBM yesterday.  Denies any other complaints or concerns.   ADDENDUM: pt apparently left with family without informing nursing, I received a call around 10:45am from nursing staff informing me of this and nursing stated they had called the family to tell them to come back for his medications; was told by Sharia Reeve RN that pt would be discharged from the system. I then received a call at 11:40am stating that the pt's family brought the patient back and he was altered, code stroke was called.   ROS: as per HPI. Denies CP, SOB, abd pain, N/V/D/C, or any other complaints at this time.  +at home patient has had episodes of dizziness after dialysis  Objective:   No results found. Recent Labs    03/01/23 0900  WBC 5.7  HGB 10.5*  HCT 32.4*  PLT 210     Recent Labs    03/01/23 0900  NA 137  K 4.4  CL 95*  CO2 24  GLUCOSE 150*  BUN 28*  CREATININE 10.71*  CALCIUM 9.8      Intake/Output Summary (Last 24 hours) at 03/02/2023 1035 Last data filed at 03/01/2023 1345 Gross per 24 hour  Intake --  Output 3000 ml  Net -3000 ml        Physical Exam: Vital Signs Blood pressure (!) 163/97, pulse 83, temperature 98.7 F (37.1 C), temperature source Oral, resp. rate 14, height 6' (1.829 m), weight 70.4 kg, SpO2 93%.  Gen: no distress, normal appearing, BMI 21.86, sitting in chair ready to go HEENT: oral mucosa pink and moist, NCAT Cardio: Reg rate and rhythm, nl s1/s2, no m/r/g appreciated Chest: normal effort, normal rate of breathing, CTAB Abd: soft, non-distended, nonTTP Ext: no clubbing,  cyanosis, or edema, LUE fistula Psych: pleasant and cooperative Skin: dry, warm. Healed lesion on L forearm.   PRIOR EXAMS: MsK: moves extremities antigravity but full msk testing not done. L hip with mild TTP to greater trochanteric bursa area, no crepitus or deformity, able to do ROM without much discomfort.   Neuro: Alert and oriented x 3. Improving recall. Mild left facial weakness with mild to moderate dysarthria (although pt feels that he's close to baseline from speech standpoint). Demonstrates fair insight and awareness. Follows all basic commands. Motor: RUE 4+/5 prox to distal. LUE 4- to 4/5 prox to distal. RLE 4+/5. RLE 4/5 prox to distal. Sensory exam normal for light touch and pain RUE and RLE. May be slightly diminished LUE and LLE. No limb ataxia or cerebellar signs. No abnormal tone appreciated. Impaired balance  Assessment/Plan: 1. Functional deficits which require 3+ hours per day of interdisciplinary therapy in a comprehensive inpatient rehab setting. Physiatrist is providing close team supervision and 24 hour management of active medical problems listed below. Physiatrist and rehab team continue  to assess barriers to discharge/monitor patient progress toward functional and medical goals  Care Tool:  Bathing    Body parts bathed by patient: Right arm, Right lower leg, Left lower leg, Left arm, Chest, Face, Abdomen, Front perineal area, Buttocks, Right upper leg, Left upper leg         Bathing assist Assist Level: Independent     Upper Body Dressing/Undressing Upper body dressing   What is the patient wearing?: Pull over shirt    Upper body assist Assist Level: Independent    Lower Body Dressing/Undressing Lower body dressing      What is the patient wearing?: Pants, Underwear/pull up     Lower body assist Assist for lower body dressing: Independent     Toileting Toileting Toileting Activity did not occur (Clothing management and hygiene only): N/A (no  void or bm)  Toileting assist Assist for toileting: Independent     Transfers Chair/bed transfer  Transfers assist     Chair/bed transfer assist level: Independent     Locomotion Ambulation   Ambulation assist      Assist level: Independent Assistive device: No Device Max distance: 147ft   Walk 10 feet activity   Assist     Assist level: Independent Assistive device: No Device   Walk 50 feet activity   Assist    Assist level: Independent Assistive device: No Device    Walk 150 feet activity   Assist    Assist level: Independent Assistive device: No Device    Walk 10 feet on uneven surface  activity   Assist Walk 10 feet on uneven surfaces activity did not occur: Safety/medical concerns   Assist level: Supervision/Verbal cueing     Wheelchair     Assist Is the patient using a wheelchair?: No   Wheelchair activity did not occur: N/A         Wheelchair 50 feet with 2 turns activity    Assist    Wheelchair 50 feet with 2 turns activity did not occur: N/A       Wheelchair 150 feet activity     Assist  Wheelchair 150 feet activity did not occur: N/A       Blood pressure (!) 163/97, pulse 83, temperature 98.7 F (37.1 C), temperature source Oral, resp. rate 14, height 6' (1.829 m), weight 70.4 kg, SpO2 93%.  Medical Problem List and Plan: 1. 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 Neuro in with him currently.   2.  Atrial fibrillation: continue Eliquis 5mg   BID. Increase Cardizem to 120mg  daily for tomorrow    3. Vitamin D insufficiency: start ergocalciferol 50,000U once per week for 7 weeks  4. Insomnia: continue melatonin tp 5mg  HS             --trazodone prn added for insomnia. Used tylenol PM occasionally at home.    -02/22/23 reminded pt about PRN trazodone, will try this tonight -02/23/23 trazodone didn't help much; will  schedule melatonin 5mg  QHS, leave PRN trazodone as well -03/01/23 still sleeping poorly, wants to try trazodone again, could schedule it if it helps             -antipsychotic agents: N/A 5. Neuropsych/cognition: This patient is capable of making decisions on his  own behalf. 6. Skin/Wound Care: routine pressure relief measures.  7. Fluids/Electrolytes/Nutrition:  Strict I/O. 1200 cc FR/day  8. HTN: Monitor BP TID--permissive HTN window completed. Increase Cardizem to 120mg  daily for tomorrow --continue to HOLD diltiazem 180mg  QD, coreg (?), clonidine 0.1mg  BID.   -03/01/23 BPs high, restarted cardizem CD 120mg  daily today so will need to monitor BPs to see what changes need to be made. Suspect will need to go back on prior BP meds but looks like several changes were made recently -03/02/23 BP slowly downtrending, pt has a plan for home; monitor at home and f/up outpatient Vitals:   03/01/23 1030 03/01/23 1100 03/01/23 1130 03/01/23 1200  BP: (!) 170/98 (!) 189/105 (!) 184/88 (!) 163/91   03/01/23 1230 03/01/23 1300 03/01/23 1330 03/01/23 1345  BP: (!) 175/92 (!) 183/88 (!) 165/99 (!) 167/95   03/01/23 1428 03/01/23 1816 03/01/23 2046 03/02/23 0532  BP: (!) 171/94 (!) 160/76 (!) 169/92 (!) 163/97    9. Impaired balance: continue PT   10 ESRD: Schedule HD at the end of the day on TTS. 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   11. Seizures hx: Continue Keppra 500mg  daily and additional 250 mg on TTS after HD  12. Anemia of chronic disease: Stable. Serial CBC with HD. Hgb  reviewed and was stable at 11.4 on 7/6. Stable at 10.6 today 03/01/23  13.  Dyslipidemia: HDL-29  [id number]. Continue Lipitor 40mg  QD.  14. L hip pain: continue kpad.  -7/8 XR reviewed and shows mild OA   I spent >55mins performing patient care related activities, including face to face time, documentation time, code stroke reassessment discussion, and overall coordination of care.   LOS: 9 days A FACE TO FACE EVALUATION WAS PERFORMED  [address] 03/02/2023, 10:35 AM

## 2023-03-02 NOTE — Progress Notes (Addendum)
Called by Dr. Amada Jupiter regarding EEG finding of possible focal status at the location of the acute left parietal hemorrhage. Uncertain if this is focal status or just frequent spikes. Spot EEG converted to LTM.   Ativan challenge 2 mg IV STAT has been ordered. We will notify Dr. Amada Jupiter after the injection so that he can view EEG to determine if the Ativan has had an effect.   His PO Keppra at 500 mg QD, which he was receiving in rehab, has been changed to IV.   Electronically signed: Dr. Caryl Pina

## 2023-03-02 NOTE — Consult Note (Signed)
NAME:  Tanner Rollins, MRN:  [id number], DOB:  [birth year], LOS: 0 ADMISSION DATE:  03/02/2023, CONSULTATION DATE:  03/02/2023 REFERRING MD: Dr. Wilford Corner, CHIEF COMPLAINT: Seizures, encephalopathy  History of Present Illness:  71 year old man with hypertension and end-stage renal disease on HD, seizure disorder, atrial fibrillation on Eliquis with medical noncompliance.  He was admitted 7/2-7/5 with right corona radiata CVAs.  He completed rehab and then was discharged home on 9/14 on Keppra, Eliquis.  After leaving the hospital he was noted to slump over in the car and was brought back for emergent evaluation.  Head CT showed an acute parenchymal hemorrhage and left parietal lobe 2.2 x 1.4 x 2.0 cm with some surrounding vasogenic edema.  He was admitted to 4 N., course complicated by left-sided seizure activity.  He was loaded and continued on valproate, continued Keppra.  Subsequently continuous EEG continues to show seizures and he received Vimpat.  There was some slowing of epileptogenic discharges but seizures continued.  Some associated encephalopathy, suppressed mental status, hypotension and bradycardia.  PCCM consulted for airway protection so he can undergo burst suppression.  Pertinent  Medical History   Past Medical History:  Diagnosis Date   ESRD on hemodialysis (HCC)    [facility]   Hypertension    PAF (paroxysmal atrial fibrillation) (HCC)    Seizure disorder (HCC)     Significant Hospital Events: Including procedures, antibiotic start and stop dates in addition to other pertinent events   Head CT 7/14 > acute parenchymal hemorrhage left parietal lobe 2.2 x 1.4 x 2.0 cm with some surrounding vasogenic edema and effacement of the sulci.  No midline shift present.  Multiple remote lacunar infarcts noted in the basal ganglia and thalami   Interim History / Subjective:  Hemodynamically stable, relative bradycardia Persistent seizure activity noted on continuous EEG  Objective    Blood pressure 121/63, pulse (!) 48, temperature 98.8 F (37.1 C), temperature source Axillary, resp. rate 15, SpO2 100%.        Intake/Output Summary (Last 24 hours) at 03/02/2023 2350 Last data filed at 03/02/2023 2000 Gross per 24 hour  Intake 257.25 ml  Output --  Net 257.25 ml   There were no vitals filed for this visit.  Examination: General: Thin ill-appearing man, sedated HENT: Oropharynx moist, malodorous secretions Lungs: Clear bilaterally Cardiovascular: Irregularly irregular, borderline bradycardic, no murmur Abdomen: Nondistended positive bowel sounds Extremities: No edema Neuro: Sedate, will rales slightly to sternal rub.  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 Amyloid vasculopathy Right corona radiata CVAs Multifactorial encephalopathy, due to the above as well as sedating meds / AED -Anticoagulation held -Blood pressure goal systolic 130-150.  Cleviprex available -AED: Valproate, Keppra, Vimpat.  Discussed with Dr. Wilford Corner.  Will either add phenobarbital or undertake formal burst suppression with propofol/benzos.  Will require intubation and mechanical ventilation for airway protection to facilitate.  Sedation will be with propofol -Repeat head CT as per neurology plans -Continuous EEG monitoring  Acute respiratory failure due to impaired airway protection -Plan for elective intubation and mechanical ventilation for airway protection now -Sedation per PAD protocol, propofol  ESRD, received HD on 7/14 -Appreciate nephrology management -Planning for HD Tuesday, Thursday, Saturday without heparin -Follow BMP, electrolytes closely  Atrial fibrillation, currently bradycardic on monitor -Eliquis discontinued -Plan to hold amiodarone given  his relative  bradycardia  Hx HTN -hold coreg, dilt, clonidine -Cleviprex available if needed but currently not running, SBP goal 130-150  At risk malnutrition -Initiate TF 7/15 if he is not going to be quickly extubated  Best Practice (right click and "Reselect all SmartList Selections" daily)   Diet/type: NPO DVT prophylaxis: SCD GI prophylaxis: PPI Lines: N/A Foley:  Yes, and it is still needed Code Status:  full code Last date of multidisciplinary goals of care discussion [pending]  Labs   CBC: Recent Labs  Lab 02/25/23 1150 03/01/23 0900 03/02/23 1839  WBC 4.8 5.7 6.8  NEUTROABS  --   --  4.4  HGB 11.9* 10.5* 13.5  HCT 36.0* 32.4* 41.2  MCV 96.3 95.9 97.2  PLT 256 210 236    Basic Metabolic Panel: Recent Labs  Lab 02/25/23 1150 03/01/23 0900 03/02/23 1839  NA 135 137 134*  K 4.0 4.4 4.4  CL 92* 95* 92*  CO2 26 24 23   GLUCOSE 85 150* 93  BUN 35* 28* 28*  CREATININE 13.27* 10.71* 8.60*  CALCIUM 10.0 9.8 10.1  PHOS 4.6 4.0  --    GFR: Estimated Creatinine Clearance: 8 mL/min (A) (by C-G formula based on SCr of 8.6 mg/dL (H)). Recent Labs  Lab 02/25/23 1150 03/01/23 0900 03/02/23 1839  WBC 4.8 5.7 6.8    Liver Function Tests: Recent Labs  Lab 02/25/23 1150 03/01/23 0900 03/02/23 1839  AST  --   --  14*  ALT  --   --  11  ALKPHOS  --   --  101  BILITOT  --   --  0.5  PROT  --   --  8.0  ALBUMIN 3.4* 3.2* 3.9   No results for input(s): "LIPASE", "AMYLASE" in the last 168 hours. No results for input(s): "AMMONIA" in the last 168 hours.  ABG    Component Value Date/Time   PHART 7.393 01/24/2022 0611   PCO2ART 19.2 (LL) 01/24/2022 0611   PO2ART 60 (L) 01/24/2022 0611   HCO3 29.2 (H) 02/02/2022 1802   TCO2 29 02/18/2023 0903   ACIDBASEDEF 12.0 (H) 01/24/2022 0611   O2SAT 99 02/02/2022 1802     Coagulation Profile: Recent Labs  Lab 03/02/23 1839  INR 1.4*    Cardiac Enzymes: No results for input(s): "CKTOTAL", "CKMB", "CKMBINDEX",  "TROPONINI" in the last 168 hours.  HbA1C: Hgb A1c MFr Bld  Date/Time Value Ref Range Status  02/19/2023 04:34 AM 5.3 4.8 - 5.6 % Final    Comment:    (NOTE) Pre diabetes:          5.7%-6.4%  Diabetes:              >6.4%  Glycemic control for   <7.0% adults with diabetes   02/02/2022 09:46 PM 5.4 4.8 - 5.6 % Final    Comment:    (NOTE) Pre diabetes:          5.7%-6.4%  Diabetes:              >6.4%  Glycemic control for   <7.0% adults with diabetes     CBG: Recent Labs  Lab 03/01/23 1427 03/02/23 1124  GLUCAP 114* 89    Review of Systems:   Unable to obtain  Past Medical History:  He,  has a past medical history of ESRD on hemodialysis (HCC), Hypertension, PAF (paroxysmal atrial fibrillation) (HCC), and Seizure disorder (HCC).   Surgical History:   Past Surgical History:  Procedure Laterality Date  ANGIOPLASTY Left 06/10/2022   Procedure: Balloon VENOPLASTY;  Surgeon: Chuck Hint, MD;  Location: [facility] OR;  Service: Vascular;  Laterality: Left;   AV FISTULA PLACEMENT Left 01/28/2022   Procedure: ARTERIOVENOUS (AV) FISTULA CREATION VS.GRAFT ARM;  Surgeon: Chuck Hint, MD;  Location: [facility] OR;  Service: Vascular;  Laterality: Left;   FISTULOGRAM Left 06/10/2022   Procedure: FISTULOGRAM;  Surgeon: Chuck Hint, MD;  Location: [facility] OR;  Service: Vascular;  Laterality: Left;   IR FLUORO GUIDE CV LINE RIGHT  01/24/2022   IR REMOVAL TUN CV CATH W/O FL  10/04/2022   IR THORACENTESIS ASP PLEURAL SPACE W/IMG GUIDE  02/12/2022   IR US GUIDE VASC ACCESS RIGHT  01/24/2022     Social History:   reports that he quit smoking about 33 years ago. His smoking use included cigarettes. He has never used smokeless tobacco. He reports that he does not drink alcohol and does not use drugs.   Family History:  His family history is not on file.   Allergies No Known Allergies   Home Medications  Prior to Admission medications   Medication Sig Start Date End  Date Taking? Authorizing Provider  acetaminophen (TYLENOL) 500 MG tablet Take 1 tablet (500 mg total) by mouth every 6 (six) hours as needed for moderate pain or headache. 02/28/23   Love, Evlyn Kanner, PA-C  amiodarone (PACERONE) 200 MG tablet Take 1 tablet (200 mg total) by mouth daily. 02/28/23 03/30/23  Love, Evlyn Kanner, PA-C  apixaban (ELIQUIS) 5 MG TABS tablet Take 1 tablet (5 mg total) by mouth 2 (two) times daily. 02/28/23   Love, Evlyn Kanner, PA-C  atorvastatin (LIPITOR) 40 MG tablet Take 1 tablet (40 mg total) by mouth daily. 02/28/23 03/30/23  Love, Evlyn Kanner, PA-C  diltiazem (CARDIZEM CD) 120 MG 24 hr capsule Take 1 capsule (120 mg total) by mouth daily. 03/01/23   Love, Evlyn Kanner, PA-C  levETIRAcetam (KEPPRA) 500 MG tablet Take 1 tablet (500 mg total) by mouth daily. Take addition 250 mg (1/2 tablet) after HD on TTS 03/01/23   Love, Evlyn Kanner, PA-C  melatonin 5 MG TABS Take 1 tablet (5 mg total) by mouth at bedtime. 02/28/23   Love, Evlyn Kanner, PA-C  Multiple Vitamin (MULTIVITAMIN WITH MINERALS) TABS tablet Take 1 tablet by mouth daily.    [provider]  sevelamer carbonate (RENVELA) 800 MG tablet Take 2 tablets (1,600 mg total) by mouth 3 (three) times daily with meals. 02/28/23   Love, Evlyn Kanner, PA-C  Vitamin D, Ergocalciferol, (DRISDOL) 1.25 MG (50000 UNIT) CAPS capsule Take 1 capsule (50,000 Units total) by mouth every 7 (seven) days. 03/04/23   Jacquelynn Cree, PA-C     Critical care time: 45 minutes     Levy Pupa, MD, PhD 03/03/2023, 12:22 AM [facility] [phone] or if no answer before 7:00PM call [phone] For any issues after 7:00PM please call eLink [phone]

## 2023-03-02 NOTE — Progress Notes (Addendum)
Subjective:  noted past 24hr events, "daughter without waiting for CIR d/c instructions and without notifying staff that they were leaving ", daughter reported patient confusion after leaving hosp. Brings pt back , pt. Having Aphasia sent for Ct HD showing ICH , and pt very Hypertensive ,sent to ICU  4N,   Currently mumbling speech , smiles , doesn't follow directions  in 4N19    Objective Vital signs in last 24 hours: Vitals:   03/02/23 1200 03/02/23 1205 03/02/23 1210 03/02/23 1211  BP: (!) 168/108 (!) 210/98 (!) 185/101 (!) 172/85  Pulse:    68  Resp:      SpO2:    100%   Weight change:   Physical Exam: General: alert but mumbling speech NAD  Heart: RRR, No MRG Lungs: CTA  Bialt nonlabored  Abdomen: NABS, soft , NT,ND Extremities: No pedal edema   Dialysis Access: LUA  Avf + bruit    Dialysis Orders: TTS at [facility] 4hr, 400/800, EDW 70kg, 2K/2Ca, AVF, 15g needles, - heparin 3000 units IV bolus as op ( FOR NO HEP HD NOW WITH  ICH 03/02/23 admit) - no recent ESA, Hgb typically > 11 - no VDRA at the moment, prev on Hectoral - held as of 6/29 for Ca 10.2   Problem/Plan: NEW ICH = IN ICU , RX per CCM team  //with recent admit (CIR dc today ) for Acute R CVA (2 areas):Noted was on On Eliquis  for ho AFib  ESRD:  Usual TTS schedule  No hd needs today -  USE   NO HEPARIN HD , Next HD 03/04/23 Hypertension/volume: BP high - IV Meds per CCM , No edema on exam  . O2 sats ok  Anemia: Hgb yest 10.5   no ESA for now Metabolic bone disease: PO4 at goal. Continue binders. Corrected Ca+ high. Hold VDRA.  Nutrition:  Alb low, Supplement po when ok  for pos  pA-Fib:  was On Eliquis + amiodarone(prior to ICH )  Lenny Pastel, PA-C [facility] [phone] 03/02/2023,1:06 PM  LOS: 0 days   Labs: Basic Metabolic Panel: Recent Labs  Lab 02/25/23 1150 03/01/23 0900  NA 135 137  K 4.0 4.4  CL 92* 95*  CO2 26 24  GLUCOSE 85 150*  BUN 35* 28*  CREATININE 13.27* 10.71*   CALCIUM 10.0 9.8  PHOS 4.6 4.0   Liver Function Tests: Recent Labs  Lab 02/25/23 1150 03/01/23 0900  ALBUMIN 3.4* 3.2*   No results for input(s): "LIPASE", "AMYLASE" in the last 168 hours. No results for input(s): "AMMONIA" in the last 168 hours. CBC: Recent Labs  Lab 02/25/23 1150 03/01/23 0900  WBC 4.8 5.7  HGB 11.9* 10.5*  HCT 36.0* 32.4*  MCV 96.3 95.9  PLT 256 210   Cardiac Enzymes: No results for input(s): "CKTOTAL", "CKMB", "CKMBINDEX", "TROPONINI" in the last 168 hours. CBG: Recent Labs  Lab 03/01/23 1427 03/02/23 1124  GLUCAP 114* 89    Studies/Results: CT HEAD CODE STROKE WO CONTRAST  Result Date: 03/02/2023 CLINICAL DATA:  Code stroke. Neuro deficit, acute, stroke suspected. EXAM: CT HEAD WITHOUT CONTRAST TECHNIQUE: Contiguous axial images were obtained from the base of the skull through the vertex without intravenous contrast. RADIATION DOSE REDUCTION: This exam was performed according to the departmental dose-optimization program which includes automated exposure control, adjustment of the mA and/or kV according to patient size and/or use of iterative reconstruction technique. COMPARISON:  CT head without contrast 02/18/2023. MR head without contrast 02/18/2023. FINDINGS:  Brain: Acute parenchymal hemorrhage in the left parietal lobe measures 2.2 x 1.4 x 2.0 cm. There is some surrounding vasogenic edema and involvement effacement of the sulci. No midline shift is present. Remote encephalomalacia of the right parietal lobe is again noted. 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 Multiple remote lacunar infarcts are again noted within the basal ganglia and thalami. The brainstem and cerebellum are within normal limits. Vascular: Atherosclerotic calcifications are present within the cavernous internal carotid arteries bilaterally. Calcifications are present the dural margin of both vertebral arteries. No  hyperdense vessel is present. Skull: Calvarium is intact. No focal lytic or blastic lesions are present. No significant extracranial soft tissue lesion is present. Sinuses/Orbits: The paranasal sinuses and mastoid air cells are clear. The globes and orbits are within normal limits. IMPRESSION: 1. Acute parenchymal hemorrhage in the left parietal lobe measures 2.2 x 1.4 x 2.0 cm. There is some surrounding vasogenic edema and involvement of the sulci. No midline shift is present. 2. Remote encephalomalacia of the right parietal lobe is again noted. 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 3. Multiple remote lacunar infarcts within the basal ganglia and thalami. 4. Atherosclerosis. The above was relayed via text pager to Dr. Agnes Lawrence on 03/02/2023 at 12:06 . Electronically Signed   By: Marin Roberts M.D.   On: 03/02/2023 12:07   Medications:  clevidipine 4 mg/hr (03/02/23 1221)   levETIRAcetam 500 mg (03/02/23 1302)    [START ON 03/03/2023]  stroke: early stages of recovery book   Does not apply Once   pantoprazole (PROTONIX) IV  40 mg Intravenous QHS   senna-docusate  1 tablet Per Tube BID

## 2023-03-02 NOTE — Code Documentation (Addendum)
Stroke Response Nurse Documentation Code Documentation  Barclay Look is a 72 y.o. male admitted to [facility] on 02/21/2023 for functional deficits secondary to CVA with past medical hx of HTN, ESRD- HD MWF, A Fib. On Eliquis (apixaban) daily. Code stroke was activated by 87M RN .   Patient on 87M where he was LKW at [phone] and now complaining of confusion. Pt was planned for discharge today. He had left the unit with family prior to formally being discharged. Family brought pt back up to 87M when they noticed him to become confused. Upon further assessment, pt noted to be aphasic and not following commands. At that time a code stroke was activated.   Stroke team at the bedside after patient activation. Patient to CT with team.   NIHSS 21, see documentation for details and code stroke times. Patient with disoriented, not following commands, right gaze preference, bilateral hemianopia, left arm weakness, bilateral leg weakness, global aphasia, dysarthria, and left neglect on exam.   The following imaging was completed:  CT Head. Patient is not a candidate for IV Thrombolytic due to CT findings. Patient is not a candidate for IR due to CT findings.   Care/Plan: q1h NIHSS, VS, and pupils - 10mg  IV Labetolol given and Cleviprex gtt started following multiple attempts to establish IV access. Pt difficult IV start. Pt admitted to ICU for blood pressure management.   Bedside handoff with RN Charlynne Pander.    Kharee Lesesne L Christionna Poland  Rapid Response RN

## 2023-03-02 NOTE — Plan of Care (Addendum)
EEG somewhat better with VPA but still continues to be epileptogenic, possible status on the left. Will not burst suppress. Will add Vimpat.200 BID - first dose now. Will follow.   -- Milon Dikes, MD Neurologist [facility] Pager: [phone]

## 2023-03-02 NOTE — Procedures (Addendum)
History: 72 yo M with acute IPH and AMS  Sedation: none  Technique: This EEG was acquired with electrodes placed according to the International 10-20 electrode system (including Fp1, Fp2, F3, F4, C3, C4, P3, P4, O1, O2, T3, T4, T5, T6, A1, A2, Fz, Cz, Pz). The following electrodes were missing or displaced: none.   Background: There is a posterior dominant rhythm of 8 Hz which is seen bilaterally.  On the left, there is also focal irregular high-voltage slow activity  P7 > O1 > P3 > P7,T7.  There is also diffuse irregular slow activity intruding in the background as well.  There is some sharp activity occasionally with the same distribution.   There is waxing waning of this activity, but no definite evolution.  Photic stimulation: Physiologic driving is now performed  EEG Abnormalities: 1) left parieto-occipital sharp waves 2) left parieto-occipital focal cerebral dysfunction 3) generalized irregular slow activity  Clinical Interpretation: This EEG is consistent with an area of focal cerebral dysfunction with the potential to serve as a seizure focus in the left parieto-occipital region.  There is overriding fast activity, and though this could simply represent an area of focal cerebral dysfunction in the setting of hemorrhage, if there is clinical suspicion, would consider Ativan challenge.    Ritta Slot, MD [facility] [phone]  If 7pm- 7am, please page neurology on call as listed in AMION.

## 2023-03-02 NOTE — Progress Notes (Signed)
Patient family left with patient without notifying staff of departure at 9:50 am. At 10:30 am the patient family called the desk on 463M to report new onset confusion. This nurse reccommended that the patient be evaluated by the Emergency Department. the patient family reappeared to 463M nursing station with patient displaying overt signs of aphasia and not following commands. Rapid response nurse notified and code stroke protocol activated. Stat Head Ct obtained with new ICH apparent on Head CT patient placement notified of need for ICU bed. Patient given IV Labetolol and started on Cleviprex by RRT. Patient transferred to ICU for increased monitoring and treatment of ICH. DON, and Md notified of change in status and discharged off to ICU.

## 2023-03-02 NOTE — Progress Notes (Signed)
Assessed patient and noted patient was less responsive than previously and that patient's hand and neck were twitching slightly. Patient gaze still deviated right. Elmer Picker, NP at bedside and ordered 2 mg ativan administered and stat head CT.

## 2023-03-02 NOTE — Progress Notes (Cosign Needed)
Patient is alert and oriented x4, no pain at this time. 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

## 2023-03-02 NOTE — Progress Notes (Signed)
EEG complete - results pending 

## 2023-03-02 NOTE — Progress Notes (Signed)
This nurse received call from family member that the patient had been picked up by daughter without waiting for d/c instructions and without notifying staff that they were leaving. This nurse was in rooms passing medications and the family member told EVS staff that they were leaving but did not talk to a nurse or nurse tech and just left.  Received a phone call an hour later stating that the patient was confused and the family had not seen the patient like this since he'd been there advised that since the patient had been discharged they needed to seek care from the emergency department about any severe concerns.  This nurse notified PA and Charge nurse of patient leaving without instructions. Plan of care completed d/c instructions offered to family by Reatha Harps per notes but no medication sent with family, and [facility].

## 2023-03-02 NOTE — Progress Notes (Addendum)
HR is in the 40's. RN notified Wilford Corner MD. Also notified MD of patient's neurological status. Will continue to monitor.

## 2023-03-03 ENCOUNTER — Inpatient Hospital Stay (HOSPITAL_COMMUNITY): Payer: No Typology Code available for payment source

## 2023-03-03 DIAGNOSIS — G40901 Epilepsy, unspecified, not intractable, with status epilepticus: Secondary | ICD-10-CM

## 2023-03-03 DIAGNOSIS — E43 Unspecified severe protein-calorie malnutrition: Secondary | ICD-10-CM | POA: Insufficient documentation

## 2023-03-03 DIAGNOSIS — R569 Unspecified convulsions: Secondary | ICD-10-CM | POA: Diagnosis not present

## 2023-03-03 DIAGNOSIS — I611 Nontraumatic intracerebral hemorrhage in hemisphere, cortical: Secondary | ICD-10-CM

## 2023-03-03 DIAGNOSIS — G40001 Localization-related (focal) (partial) idiopathic epilepsy and epileptic syndromes with seizures of localized onset, not intractable, with status epilepticus: Secondary | ICD-10-CM

## 2023-03-03 LAB — POCT I-STAT 7, (LYTES, BLD GAS, ICA,H+H)
Acid-Base Excess: 4 mmol/L — ABNORMAL HIGH (ref 0.0–2.0)
Acid-Base Excess: 5 mmol/L — ABNORMAL HIGH (ref 0.0–2.0)
Bicarbonate: 26.6 mmol/L (ref 20.0–28.0)
Bicarbonate: 27 mmol/L (ref 20.0–28.0)
Calcium, Ion: 1.08 mmol/L — ABNORMAL LOW (ref 1.15–1.40)
Calcium, Ion: 1.08 mmol/L — ABNORMAL LOW (ref 1.15–1.40)
HCT: 38 % — ABNORMAL LOW (ref 39.0–52.0)
HCT: 38 % — ABNORMAL LOW (ref 39.0–52.0)
Hemoglobin: 12.9 g/dL — ABNORMAL LOW (ref 13.0–17.0)
Hemoglobin: 12.9 g/dL — ABNORMAL LOW (ref 13.0–17.0)
O2 Saturation: 100 %
O2 Saturation: 98 %
Patient temperature: 36.7
Patient temperature: 37.6
Potassium: 5.4 mmol/L — ABNORMAL HIGH (ref 3.5–5.1)
Potassium: 5.6 mmol/L — ABNORMAL HIGH (ref 3.5–5.1)
Sodium: 130 mmol/L — ABNORMAL LOW (ref 135–145)
Sodium: 130 mmol/L — ABNORMAL LOW (ref 135–145)
TCO2: 28 mmol/L (ref 22–32)
TCO2: 28 mmol/L (ref 22–32)
pCO2 arterial: 31.8 mmHg — ABNORMAL LOW (ref 32–48)
pCO2 arterial: 34.4 mmHg (ref 32–48)
pH, Arterial: 7.501 — ABNORMAL HIGH (ref 7.35–7.45)
pH, Arterial: 7.532 — ABNORMAL HIGH (ref 7.35–7.45)
pO2, Arterial: 243 mmHg — ABNORMAL HIGH (ref 83–108)
pO2, Arterial: 93 mmHg (ref 83–108)

## 2023-03-03 LAB — BASIC METABOLIC PANEL
Anion gap: 20 — ABNORMAL HIGH (ref 5–15)
BUN: 35 mg/dL — ABNORMAL HIGH (ref 8–23)
CO2: 22 mmol/L (ref 22–32)
Calcium: 9 mg/dL (ref 8.9–10.3)
Chloride: 87 mmol/L — ABNORMAL LOW (ref 98–111)
Creatinine, Ser: 9.7 mg/dL — ABNORMAL HIGH (ref 0.61–1.24)
GFR, Estimated: 5 mL/min — ABNORMAL LOW (ref >60)
Glucose, Bld: 164 mg/dL — ABNORMAL HIGH (ref 70–99)
Potassium: 6.5 mmol/L (ref 3.5–5.1)
Sodium: 129 mmol/L — ABNORMAL LOW (ref 135–145)

## 2023-03-03 LAB — CBC
HCT: 36.4 % — ABNORMAL LOW (ref 39.0–52.0)
Hemoglobin: 12.4 g/dL — ABNORMAL LOW (ref 13.0–17.0)
MCH: 32.2 pg (ref 26.0–34.0)
MCHC: 34.1 g/dL (ref 30.0–36.0)
MCV: 94.5 fL (ref 80.0–100.0)
Platelets: 239 10*3/uL (ref 150–400)
RBC: 3.85 MIL/uL — ABNORMAL LOW (ref 4.22–5.81)
RDW: 14.7 % (ref 11.5–15.5)
WBC: 8.5 10*3/uL (ref 4.0–10.5)
nRBC: 0 % (ref 0.0–0.2)

## 2023-03-03 LAB — MAGNESIUM
Magnesium: 1.8 mg/dL (ref 1.7–2.4)
Magnesium: 1.6 mg/dL — ABNORMAL LOW (ref 1.7–2.4)

## 2023-03-03 LAB — GLUCOSE, CAPILLARY
Glucose-Capillary: 94 mg/dL (ref 70–99)
Glucose-Capillary: 100 mg/dL — ABNORMAL HIGH (ref 70–99)
Glucose-Capillary: 93 mg/dL (ref 70–99)
Glucose-Capillary: 80 mg/dL (ref 70–99)
Glucose-Capillary: 80 mg/dL (ref 70–99)

## 2023-03-03 LAB — TRIGLYCERIDES: Triglycerides: 183 mg/dL — ABNORMAL HIGH (ref <150)

## 2023-03-03 LAB — PHOSPHORUS
Phosphorus: 4.6 mg/dL (ref 2.5–4.6)
Phosphorus: 2.5 mg/dL (ref 2.5–4.6)

## 2023-03-03 LAB — HEPATITIS B SURFACE ANTIGEN: Hepatitis B Surface Ag: NONREACTIVE

## 2023-03-03 MED ORDER — KETAMINE BOLUS VIA INFUSION
1.5000 mg/kg | Freq: Once | INTRAVENOUS | Status: AC
  Administered 2023-03-03: 105.6 mg via INTRAVENOUS
  Filled 2023-03-03: qty 110

## 2023-03-03 MED ORDER — SODIUM ZIRCONIUM CYCLOSILICATE 10 G PO PACK
10.0000 g | PACK | Freq: Once | ORAL | Status: AC
  Administered 2023-03-03: 10 g
  Filled 2023-03-03: qty 1

## 2023-03-03 MED ORDER — MIDAZOLAM-SODIUM CHLORIDE 100-0.9 MG/100ML-% IV SOLN
0.5000 mg/h | INTRAVENOUS | Status: DC
  Administered 2023-03-03: 16 mg/h via INTRAVENOUS
  Administered 2023-03-03: 10 mg/h via INTRAVENOUS
  Administered 2023-03-03 – 2023-03-04 (×2): 30 mg/h via INTRAVENOUS
  Administered 2023-03-04: 40 mg/h via INTRAVENOUS
  Filled 2023-03-03 (×6): qty 100

## 2023-03-03 MED ORDER — CHLORHEXIDINE GLUCONATE CLOTH 2 % EX PADS
6.0000 | MEDICATED_PAD | Freq: Every day | CUTANEOUS | Status: DC
  Administered 2023-03-03: 6 via TOPICAL

## 2023-03-03 MED ORDER — RENA-VITE PO TABS
1.0000 | ORAL_TABLET | Freq: Every day | ORAL | Status: DC
  Administered 2023-03-03 – 2023-03-06 (×4): 1
  Filled 2023-03-03 (×5): qty 1

## 2023-03-03 MED ORDER — VITAL 1.5 CAL PO LIQD
1000.0000 mL | ORAL | Status: DC
  Administered 2023-03-04 – 2023-03-07 (×3): 1000 mL

## 2023-03-03 MED ORDER — MIDAZOLAM-SODIUM CHLORIDE 100-0.9 MG/100ML-% IV SOLN
INTRAVENOUS | Status: AC
  Filled 2023-03-03: qty 100

## 2023-03-03 MED ORDER — ROCURONIUM BROMIDE 10 MG/ML (PF) SYRINGE
80.0000 mg | PREFILLED_SYRINGE | Freq: Once | INTRAVENOUS | Status: AC
  Administered 2023-03-03: 80 mg via INTRAVENOUS

## 2023-03-03 MED ORDER — VITAL HIGH PROTEIN PO LIQD
1000.0000 mL | ORAL | Status: DC
  Filled 2023-03-03: qty 1000

## 2023-03-03 MED ORDER — FAMOTIDINE 20 MG PO TABS
10.0000 mg | ORAL_TABLET | Freq: Every day | ORAL | Status: DC
  Administered 2023-03-04 – 2023-03-06 (×2): 10 mg
  Filled 2023-03-03 (×2): qty 1

## 2023-03-03 MED ORDER — NOREPINEPHRINE 4 MG/250ML-% IV SOLN
2.0000 ug/min | INTRAVENOUS | Status: DC
  Administered 2023-03-03 (×2): 2 ug/min via INTRAVENOUS
  Filled 2023-03-03 (×2): qty 250

## 2023-03-03 MED ORDER — KETAMINE HCL-SODIUM CHLORIDE 1000-0.9 MG/100ML-% IV SOLN
2.5000 mg/kg/h | INTRAVENOUS | Status: DC
  Administered 2023-03-03: 1 mg/kg/h via INTRAVENOUS
  Administered 2023-03-04 – 2023-03-05 (×7): 2.5 mg/kg/h via INTRAVENOUS
  Filled 2023-03-03 (×9): qty 100

## 2023-03-03 MED ORDER — SODIUM CHLORIDE 0.9 % IV SOLN: 250.0000 mL | INTRAVENOUS | Status: DC

## 2023-03-03 MED ORDER — PROSOURCE TF20 ENFIT COMPATIBL EN LIQD
60.0000 mL | Freq: Every day | ENTERAL | Status: DC
  Administered 2023-03-03 – 2023-03-07 (×5): 60 mL
  Filled 2023-03-03 (×5): qty 60

## 2023-03-03 MED ORDER — ATORVASTATIN CALCIUM 40 MG PO TABS
40.0000 mg | ORAL_TABLET | Freq: Every day | ORAL | Status: DC
  Administered 2023-03-03 – 2023-03-07 (×5): 40 mg
  Filled 2023-03-03 (×5): qty 1

## 2023-03-03 MED ORDER — MIDAZOLAM HCL 2 MG/2ML IJ SOLN
4.0000 mg | INTRAMUSCULAR | Status: DC | PRN
  Administered 2023-03-03 (×4): 4 mg via INTRAVENOUS
  Filled 2023-03-03: qty 4

## 2023-03-03 MED ORDER — ETOMIDATE 2 MG/ML IV SOLN
20.0000 mg | Freq: Once | INTRAVENOUS | Status: AC
  Administered 2023-03-03: 20 mg via INTRAVENOUS

## 2023-03-03 MED ORDER — KETAMINE BOLUS VIA INFUSION: 1.5000 mg/kg | Freq: Once | INTRAVENOUS | Status: DC

## 2023-03-03 NOTE — Progress Notes (Signed)
Initial Nutrition Assessment  DOCUMENTATION CODES:   Severe malnutrition in context of chronic illness  INTERVENTION:  Initiate tube feeding via cortrak once confirmed: Vital 1.5 at 55 ml/h (1320 ml per day) Start at 84mL/h and advance by 10mL q8h to goal of 55 Prosource TF20 60 ml 1x/d  Provides 2060 kcal, 109 gm protein, 1008 ml free water daily  Monitor magnesium and phosphorus every 12 hours x 4 occurrences, MD to replete as needed, as pt is at risk for refeeding syndrome given severe malnutrition.  Renal MVI daily  NUTRITION DIAGNOSIS:   Severe Malnutrition related to chronic illness (ESRD on HD) as evidenced by severe muscle depletion, severe fat depletion.  GOAL:   Patient will meet greater than or equal to 90% of their needs  MONITOR:   TF tolerance, I & O's, Vent status, Labs  REASON FOR ASSESSMENT:   Consult, Ventilator Enteral/tube feeding initiation and management  ASSESSMENT:   Pt with hx of ESRD on HD, HTN, PAF, and seizure disorder admitted to ICU with new ICH after leaving from CIR a few hours earlier.  7/5-7/14: CIR 7/14: left CIR, family brought back to 42M, admitted to 4N with ICH 7/15: intubated   Patient is currently intubated on ventilator support, no family at bedside this AM. Undergoing EEG at the time of assessment and RN administering medication via OGT (terminates in the gastric body). On exam, pt very thin with significant loss of fat and muscle.   Per CCM, ok to start nutrition today. Will also benefit from cortrak placement to continue feeds after extubation and pt's PO intake can support nutrition needs. Unsure of pt's PO hx but appears decent from flowsheet records from [facility] admission. Weight appears stable, but will start infusion low and advance to goal to monitor for refeeding.   Pt requiring some pressor support, but infusion rate decreasing.  Noted elevated K, RN reports hopeful to have HD today   MV: 8.6 L/min Temp (24hrs),  Avg:97.8 F (36.6 C), Min:96.9 F (36.1 C), Max:98.8 F (37.1 C)  Propofol: 2.11 ml/hr (58kcal/d)   Intake/Output Summary (Last 24 hours) at 03/03/2023 1303 Last data filed at 03/03/2023 1200 Gross per 24 hour  Intake 1082.92 ml  Output --  Net 1082.92 ml  Net IO Since Admission: 1,082.92 mL [03/03/23 1303]  Nutritionally Relevant Medications: Scheduled Meds:  atorvastatin  40 mg Daily   docusate  100 mg BID   feeding supplement (PROSource TF20)  60 mL Daily   feeding supplement (VITAL HIGH PROTEIN)  1,000 mL Q24H   polyethylene glycol  17 g Daily   senna-docusate  1 tablet BID   Continuous Infusions:  clevidipine Stopped (03/02/23 2340)   norepinephrine (LEVOPHED) Adult infusion 3 mcg/min (03/03/23 1200)   propofol (DIPRIVAN) infusion Stopped (03/03/23 1125)   Labs Reviewed: Na 129, chloride 87 K 6.5 BUN 35, creatinine 9.7 CBG ranges from 89-100 mg/dL over the last 24 hours HgbA1c 5.3% (7/3)  NUTRITION - FOCUSED PHYSICAL EXAM:  Flowsheet Row Most Recent Value  Orbital Region Severe depletion  Upper Arm Region Severe depletion  Thoracic and Lumbar Region Moderate depletion  Buccal Region Severe depletion  Temple Region Severe depletion  Clavicle Bone Region Moderate depletion  Clavicle and Acromion Bone Region Severe depletion  Scapular Bone Region Mild depletion  Dorsal Hand Moderate depletion  Patellar Region Severe depletion  Anterior Thigh Region Severe depletion  Posterior Calf Region Severe depletion  Edema (RD Assessment) None  Hair Reviewed  Eyes Reviewed  Mouth  Reviewed  Skin Reviewed  Nails Reviewed  [vertical ridges left hand]   Diet Order:   Diet Order             Diet NPO time specified  Diet effective now                   EDUCATION NEEDS:   Not appropriate for education at this time  Skin:  Skin Assessment: Reviewed RN Assessment  Last BM:  7/12  Height:   Ht Readings from Last 1 Encounters:  03/03/23 6' (1.829 m)     Weight:   Wt Readings from Last 1 Encounters:  03/02/23 70.4 kg    Ideal Body Weight:  80.9 kg  BMI:  Body mass index is 21.05 kg/m.  Estimated Nutritional Needs:  Kcal:  2000-2200 kcal/d Protein:  105-120g/d Fluid:  1L+UOP    Greig Castilla, RD, LDN Clinical Dietitian RD pager # available in AMION  After hours/weekend pager # available in [facility]

## 2023-03-03 NOTE — Progress Notes (Addendum)
eLink Physician-Brief Progress Note Patient Name: Dionel Archey DOB: Dec 01, 1950 MRN: [id number]   Date of Service  03/03/2023  HPI/Events of Note  Brief New admit note: Shifted to ICU from floor  1.Acute respiratory failure due to impaired airway protection, Vented now.  - wean VT as tolerated, 100% on 100% fio2. Follow ABG, CxR  2. 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 Amyloid vasculopathy Right corona radiata CVAs Multifactorial encephalopathy, due to the above as well as sedating meds / AED - on keppra, sz precautions  3. ESRD  4. Afib-eliquis DC ed already. S/p andexa reversal at noon.   Data: reviewed Head CT 7/14 > acute parenchymal hemorrhage left parietal lobe 2.2 x 1.4 x 2.0 cm with some surrounding vasogenic edema and effacement of the sulci.  No midline shift present.  Multiple remote lacunar infarcts noted in the basal ganglia and thalami  Camera evaluation done: In synchrony with Vent, VT 620, 6 feet. VS stable, not on pressor.    VTE: SCD, on SUP CBG goals < 180. on SSI.   Vent bundle: SAT-SBT daily when stable hemodynamics. Lung protective   Discussed with RN. Asking for levophed for softer MAP while on propofol sedation. Low dose via PIV ordered, avoid extravasation. Wean VT 580, fio2 to 40%. Follow prn ABG.   Francy Mcilvaine. Reatha Harps, MD, FCCP. [phone]    eICU Interventions       Intervention Category Major Interventions: Respiratory failure - evaluation and management Evaluation Type: New Patient Evaluation  Ranee Gosselin 03/03/2023, 1:08 AM

## 2023-03-03 NOTE — Progress Notes (Signed)
SLP Cancellation Note  Patient Details Name: Tanner Rollins MRN: [id number] DOB: 1950/12/05   Cancelled treatment:      Orders received. Pt is intubated. SLP will follow.   Alvah Lagrow L. Samson Frederic, MA CCC/SLP Clinical Specialist - Acute Care SLP Acute Rehabilitation Services Office number [phone]      Blenda Mounts Laurice 03/03/2023, 7:53 AM

## 2023-03-03 NOTE — Progress Notes (Signed)
NAME:  Tanner Rollins, MRN:  [id number], DOB:  [birth year], LOS: 1 ADMISSION DATE:  03/02/2023, CONSULTATION DATE:  03/02/2023 REFERRING MD: Dr. Wilford Corner, CHIEF COMPLAINT: Seizures, encephalopathy  History of Present Illness:  72 year old man with hypertension and end-stage renal disease on HD, seizure disorder, atrial fibrillation on Eliquis with medical noncompliance.  He was admitted 7/2-7/5 with right corona radiata CVAs.  He completed rehab and then was discharged home on 9/14 on Keppra, Eliquis.  After leaving the hospital he was noted to slump over in the car and was brought back for emergent evaluation.  Head CT showed an acute parenchymal hemorrhage and left parietal lobe 2.2 x 1.4 x 2.0 cm with some surrounding vasogenic edema.  He was admitted to 4N., course complicated by left-sided seizure activity.  He was loaded and continued on valproate, continued Keppra.  Subsequently continuous EEG continues to show seizures and he received Vimpat.  There was some slowing of epileptogenic discharges but seizures continued.  Some associated encephalopathy, suppressed mental status, hypotension and bradycardia.  PCCM consulted for airway protection so he can undergo burst suppression.  Pertinent  Medical History   Past Medical History:  Diagnosis Date   ESRD on hemodialysis (HCC)    [facility]   Hypertension    PAF (paroxysmal atrial fibrillation) (HCC)    Seizure disorder (HCC)     Significant Hospital Events: Including procedures, antibiotic start and stop dates in addition to other pertinent events   7/14 Head CT > acute parenchymal hemorrhage left parietal lobe 2.2 x 1.4 x 2.0 cm with some surrounding vasogenic edema and effacement of the sulci.  No midline shift present.  Multiple remote lacunar infarcts noted in the basal ganglia and thalami 7/15 Intubated overnight for airway protection, remains in status this am despite propofol, keppra, vimpat, and valproate. Adding versed drip    Interim History / Subjective:  Sedated on vent, remains in status with LTM in place   Objective   Blood pressure 105/72, pulse (!) 48, temperature (!) 97.5 F (36.4 C), temperature source Axillary, resp. rate 15, SpO2 99%.    Vent Mode: PRVC FiO2 (%):  [40 %-100 %] 40 % Set Rate:  [15 bmp] 15 bmp Vt Set:  [580 mL-620 mL] 580 mL PEEP:  [5 cmH20] 5 cmH20   Intake/Output Summary (Last 24 hours) at 03/03/2023 0850 Last data filed at 03/03/2023 0700 Gross per 24 hour  Intake 661.39 ml  Output --  Net 661.39 ml   There were no vitals filed for this visit.  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 Amyloid vasculopathy Right corona radiata CVAs Multifactorial encephalopathy, due to the above as well as sedating meds / AED P: Remains in status this am despite propofol, keppra, vimpat, and valproate. Adding versed drip  Anticoagulation remains on hold  Repeat images per neuro  Maintain neuro protective measures; goal for eurothermia, euglycemia, eunatermia, normoxia, and  PCO2 goal of 35-40 Nutrition and bowel regiment  Seizure precautions  Aspirations precautions  LTM  Acute respiratory failure due to impaired airway protection P: Continue ventilator support with lung protective strategies  Wean PEEP and FiO2 for sats greater than 90%. Head of bed elevated 30  degrees. Plateau pressures less than 30 cm H20.  Follow intermittent chest x-ray and ABG.   SAT/SBT as tolerated, mentation preclude extubation  Ensure adequate pulmonary hygiene  Follow cultures  VAP bundle in place  PAD protocol  ESRD, received HD on 7/14 P: Consult nephrology  Follow renal function  Trend Bmet Avoid nephrotoxins Ensure adequate renal perfusion   Atrial fibrillation, currently bradycardic on monitor Hx HTN P: Anticoagulation remains on hold  Continuous telemetry  Home antihypertension on hold  At risk malnutrition P: Start tube feeds  Protein supplementation   Best Practice (right click and "Reselect all SmartList Selections" daily)   Diet/type: NPO DVT prophylaxis: SCD GI prophylaxis: PPI Lines: N/A Foley:  Yes, and it is still needed Code Status:  full code Last date of multidisciplinary goals of care discussion [pending]  Critical care time:  CRITICAL CARE Performed by: Brunella Wileman D. Harris  Total critical care time: 40 minutes  Critical care time was exclusive of separately billable procedures and treating other patients.  Critical care was necessary to treat or prevent imminent or life-threatening deterioration.  Critical care was time spent personally by me on the following activities: development of treatment plan with patient and/or surrogate as well as nursing, discussions with consultants, evaluation of patient's response to treatment, examination of patient, obtaining history from patient or surrogate, ordering and performing treatments and interventions, ordering and review of laboratory studies, ordering and review of radiographic studies, pulse oximetry and re-evaluation of patient's condition.  Rajat Staver D. Harris, NP-C [facility] Personal contact information can be found on Amion  If no contact or response made please call [phone] 03/03/2023, 8:53 AM

## 2023-03-03 NOTE — Procedures (Signed)
Intubation Procedure Note  Tanner Rollins  [id number]  1951-05-14  Date:03/03/23  Time:12:07 AM   Provider Performing:Jadian Karman D Suzie Portela    Procedure: Intubation (31500)  Indication(s) Respiratory Failure  Consent Unable to obtain consent due to inability to find a medical decision maker for patient.  All reasonable efforts were made.  Another independent medical provider, sister Netta Corrigan , confirmed the benefits of this procedure outweigh the risks.   Anesthesia Etomidate and Rocuronium   Time Out Verified patient identification, verified procedure, site/side was marked, verified correct patient position, special equipment/implants available, medications/allergies/relevant history reviewed, required imaging and test results available.   Sterile Technique Usual hand hygeine, masks, and gloves were used   Procedure Description Patient positioned in bed supine.  Sedation given as noted above.  Patient was intubated with endotracheal tube using Glidescope.  View was Grade 1 full glottis .  Number of attempts was 1.  Colorimetric CO2 detector was consistent with tracheal placement.   Complications/Tolerance None; patient tolerated the procedure well. Chest X-ray is ordered to verify placement.   EBL Minimal   Specimen(s) None  JD Anselm Lis [facility] 03/03/2023, 12:08 AM  Please see [email or web address] for pager details.  From 7A-7P if no response, please call [phone]. After hours, please call ELink [phone].

## 2023-03-03 NOTE — Procedures (Signed)
I was present at this dialysis session. I have reviewed the session itself and made appropriate changes.   Issues with access currently. High access pressures. Possibly clotted. Unable to receive heparin given ICH. Frequent alarming of the machine. Overall, he will need a temp HD line placed. Discussed w/ CCM, appreciate assistance. Discussed with neurology as well. Discussed with HD RN, will keep running on the machine unless machine shuts off. Will try again for HD tonight, same orders once catheter is placed. Fortunately, he is off pressors and BP is actually slightly on the higher side. Will also consult IR for fistulogram. Discussed with sister at the bedside. Primary service to discuss prognosis with sister.  Will reassess tomorrow for further HD needs  There were no vitals filed for this visit.  Recent Labs  Lab 03/03/23 0842  NA 129*  K 6.5*  CL 87*  CO2 22  GLUCOSE 164*  BUN 35*  CREATININE 9.70*  CALCIUM 9.0  PHOS 4.6    Recent Labs  Lab 03/01/23 0900 03/02/23 1839 03/03/23 0054 03/03/23 0416 03/03/23 0842  WBC 5.7 6.8  --   --  8.5  NEUTROABS  --  4.4  --   --   --   HGB 10.5* 13.5 12.9* 12.9* 12.4*  HCT 32.4* 41.2 38.0* 38.0* 36.4*  MCV 95.9 97.2  --   --  94.5  PLT 210 236  --   --  239    Scheduled Meds:  atorvastatin  40 mg Per Tube Daily   Chlorhexidine Gluconate Cloth  6 each Topical Daily   docusate  100 mg Per Tube BID   [START ON 03/04/2023] famotidine  10 mg Per Tube Daily   feeding supplement (PROSource TF20)  60 mL Per Tube Daily   LORazepam  2 mg Intravenous Once   multivitamin  1 tablet Per Tube QHS   polyethylene glycol  17 g Per Tube Daily   senna-docusate  1 tablet Per Tube BID   Continuous Infusions:  sodium chloride     clevidipine Stopped (03/02/23 2340)   feeding supplement (VITAL 1.5 CAL)     lacosamide (VIMPAT) IV Stopped (03/03/23 1104)   [START ON 03/04/2023] levETIRAcetam     levETIRAcetam Stopped (03/03/23 1033)    midazolam 16 mg/hr (03/03/23 1500)   midazolam-sodium chloride     norepinephrine (LEVOPHED) Adult infusion Stopped (03/03/23 1347)   propofol (DIPRIVAN) infusion Stopped (03/03/23 1125)   valproate sodium Stopped (03/03/23 1225)   PRN Meds:.acetaminophen **OR** acetaminophen (TYLENOL) oral liquid 160 mg/5 mL **OR** acetaminophen, fentaNYL (SUBLIMAZE) injection, fentaNYL (SUBLIMAZE) injection, labetalol, LORazepam, LORazepam, midazolam, midazolam-sodium chloride   Anthony Sar, MD [facility] 03/03/2023, 4:18 PM

## 2023-03-03 NOTE — Progress Notes (Signed)
   03/03/23 1900  Vitals  Temp 98 F (36.7 C)  Pulse Rate 81  Resp 15  BP 105/71  SpO2 98 %  Oxygen Therapy  Patient Activity (if Appropriate) In bed  Pulse Oximetry Type Continuous  Oximetry Probe Site Changed No  Post Treatment  Dialyzer Clearance Clear  Duration of HD Treatment -hour(s) 2.27 hour(s) (due to bp < ended 3 mins early)  Hemodialysis Intake (mL) 200 mL  Liters Processed 43.1  Fluid Removed (mL) 0 mL  Tolerated HD Treatment Yes  AVG/AVF Arterial Site Held (minutes) 6 minutes  AVG/AVF Venous Site Held (minutes) 6 minutes   Received patient in bed to unit.  Alert and oriented.  Informed consent signed and in chart.   TX duration: 2hrs  Patient tolerated well.  Transported back to the room  Alert, without acute distress.  Hand-off given to patient's nurse.   Access used: LAVF Access issues: difficult cannulation, high venous pressures MD aware  Total UF removed: +200 Medication(s) given: none    Na'Shaminy T [facility]

## 2023-03-03 NOTE — Procedures (Signed)
Patient Name: Tanner Rollins  MRN: [id number]  Epilepsy Attending: Charlsie Quest  Referring Physician/Provider: Elmer Picker, NP  Duration: 03/02/2023 1501 to 03/03/2023 1501  Patient history: 72 yo M with acute IPH and AMS. EEG to evaluate for seizure  Level of alertness:  comatose  AEDs during EEG study: LEV, LCM , Phenobarb, Propofol  Technical aspects: This EEG study was done with scalp electrodes positioned according to the 10-20 International system of electrode placement. Electrical activity was reviewed with band pass filter of 1-70Hz , sensitivity of 7 uV/mm, display speed of 33mm/sec with a 60Hz  notched filter applied as appropriate. EEG data were recorded continuously and digitally stored.  Video monitoring was available and reviewed as appropriate.  Description: At the beginning of the study, EEG showed near continuous sharply contoured 6 to 9 Hz theta-alpha activity admixed with sharp waves in left hemisphere, maximal left parietal region with waxing and waning morphology and frequency as well as 1 to 3 seconds of generalized EEG suppression.  No clinical signs were noted.  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  Antiseizure medications were adjusted.  After around midnight on 03/03/2023, EEG showed burst suppression pattern with highly epileptiform bursts in left hemisphere, maximal left parietal region lasting 2 to 12 seconds as well as asynchronous low amplitude 3 to 5 Hz theta-delta slowing admixed with 12 to 13 Hz beta activity in right hemisphere. The interburst interval was about 3 to 7 seconds.  Hyperventilation and photic stimulation were not performed.     ABNORMALITY -Electrographic status epilepticus, left hemisphere, maximal left parietal region -Burst suppression with epileptiform bursts, left hemisphere, maximal left parietal region  IMPRESSION: This study is consistent with  electrographic status epilepticus arising from left hemisphere, maximal left frontal region.  Additionally there is profound diffuse encephalopathy, likely related to sedation.  Dr. Pearlean Brownie was notified  Raphael Espe Annabelle Harman

## 2023-03-03 NOTE — Progress Notes (Signed)
Ketamine 1.5 mg/kg IV loaded at 18:30 PM Now on 1 mg/kg/hr   EEG reviewed, continues to be consistent with focal status with periods of suppression  Not currently requiring pressors, tolerated earlier bolus well per nursing  Rebolus ketamine ordered Increase drip to 2.5 mg/kg/hr Reinitiate levo if needed to maintain goal normotension  Brooke Dare MD-PhD [facility] [phone]  CRITICAL CARE Performed by: Gordy Councilman   Total critical care time: 10 minutes  Critical care time was exclusive of separately billable procedures and treating other patients.  Critical care was necessary to treat or prevent imminent or life-threatening deterioration.  Critical care was time spent personally by me on the following activities: development of treatment plan with patient and/or surrogate as well as nursing, discussions with consultants, evaluation of patient's response to treatment, examination of patient, obtaining history from patient or surrogate, ordering and performing treatments and interventions, ordering and review of laboratory studies, ordering and review of radiographic studies, pulse oximetry and re-evaluation of patient's condition.

## 2023-03-03 NOTE — Progress Notes (Addendum)
STROKE TEAM PROGRESS NOTE   BRIEF HPI [name] is a 72 y.o. male with history of new onset seizures recently started on dual dose Keppra, atrial fibrillation Eliquis, ESRD on HD presenting with left parietal lobe hemorrhage on CT, which was reversed with Andexxa.  Admitted 02/18/2023 for Right corona radiata infarcts, discharged 7/5 to CIR.    SIGNIFICANT HOSPITAL EVENTS 7/14: Possible focal status seen on EEG, VPA added, Vimpat 200mg  BID added 7/15 overnight: intubated to protect ariway. Loaded with phenobarb, stated on propofol.  Per chart notes, at least 2 attempts were made to contact patient's sister overnight with no answer.  (Will attempt throughout day today to speak with family) 7/15 AM: Status Epilepticus seen on EEG, Versed gtt added.   INTERIM HISTORY/SUBJECTIVE On exam, patient is intubated and heavily sedated. CCM and RN at bedside. Plan of care discussed.  EEG read confirms status epilepticus arising from left hemisphere, location of patient's ICH.  Patient has had escalated care with Keppra, Vimpat, valproic acid and propofol and Versed with seizures continuing. Neurological exam is limited due to sedation but no witnessed obvious clinical seizure activity. OBJECTIVE  CBC    Component Value Date/Time   WBC 8.5 03/03/2023 0842   RBC 3.85 (L) 03/03/2023 0842   HGB 12.4 (L) 03/03/2023 0842   HCT 36.4 (L) 03/03/2023 0842   PLT 239 03/03/2023 0842   MCV 94.5 03/03/2023 0842   MCH 32.2 03/03/2023 0842   MCHC 34.1 03/03/2023 0842   RDW 14.7 03/03/2023 0842   LYMPHSABS 1.5 03/02/2023 1839   MONOABS 0.8 03/02/2023 1839   EOSABS 0.1 03/02/2023 1839   BASOSABS 0.1 03/02/2023 1839    BMET    Component Value Date/Time   NA 129 (L) 03/03/2023 0842   K 6.5 (HH) 03/03/2023 0842   CL 87 (L) 03/03/2023 0842   CO2 22 03/03/2023 0842   GLUCOSE 164 (H) 03/03/2023 0842   BUN 35 (H) 03/03/2023 0842   CREATININE 9.70 (H) 03/03/2023 0842   CALCIUM 9.0 03/03/2023 0842    GFRNONAA 5 (L) 03/03/2023 0842    IMAGING past 24 hours DG Abd Portable 1V  Result Date: 03/03/2023 CLINICAL DATA:  Feeding tube placement. EXAM: PORTABLE ABDOMEN - 1 VIEW COMPARISON:  03/03/2023. FINDINGS: Feeding tube terminates in the gastric antrum. Injection of contrast is confirmatory. Volume loss in the left lung base. IMPRESSION: 1. Feeding tube terminates in the gastric antrum. 2. Left basilar volume loss. Electronically Signed   By: Leanna Battles M.D.   On: 03/03/2023 16:06   DG CHEST PORT 1 VIEW  Result Date: 03/03/2023 CLINICAL DATA:  Central line placement EXAM: PORTABLE CHEST 1 VIEW COMPARISON:  03/03/2023, 12:10 a.m. FINDINGS: Interval placement of left neck vascular catheter, tip over the mid SVC. Interval placement of esophagogastric tube with tip and side port below the diaphragm. Endotracheal tube remains in unchanged position. Cardiomegaly. No acute airspace opacity. IMPRESSION: 1. Interval placement of left neck vascular catheter, tip over the mid SVC. No pneumothorax. 2. Interval placement of esophagogastric tube with tip and side port below the diaphragm. 3. Endotracheal tube remains in unchanged position. 4. Cardiomegaly. No acute airspace opacity. Electronically Signed   By: Jearld Lesch M.D.   On: 03/03/2023 12:36   Overnight EEG with video  Result Date: 03/03/2023 Charlsie Quest, MD     03/03/2023  9:26 AM Patient Name: Tanner Rollins MRN: [id number] Epilepsy Attending: Charlsie Quest Referring Physician/Provider: Elmer Picker, NP Duration: 03/02/2023 1501 to 03/03/2023 4742  Patient history: 72 yo M with acute IPH and AMS. EEG to evaluate for seizure Level of alertness:  comatose AEDs during EEG study: LEV, LCM , Phenobarb, Propofol Technical aspects: This EEG study was done with scalp electrodes positioned according to the 10-20 International system of electrode placement. Electrical activity was reviewed with band pass filter of 1-70Hz , sensitivity of 7 uV/mm,  display speed of 23mm/sec with a 60Hz  notched filter applied as appropriate. EEG data were recorded continuously and digitally stored.  Video monitoring was available and reviewed as appropriate. Description: At the beginning of the study, EEG showed near continuous sharply contoured 6 to 9 Hz theta-alpha activity admixed with sharp waves in left hemisphere, maximal left parietal region with waxing and waning morphology and frequency as well as 1 to 3 seconds of generalized EEG suppression.  No clinical signs were noted.  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  Antiseizure medications were adjusted.  After around midnight on 03/03/2023, EEG showed burst suppression pattern with highly epileptiform bursts in left hemisphere, maximal left parietal region lasting 2 to 12 seconds as well as asynchronous low amplitude 3 to 5 Hz theta-delta slowing admixed with 12 to 13 Hz beta activity in right hemisphere. The interburst interval was about 3 to 7 seconds.  Hyperventilation and photic stimulation were not performed.   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  Additionally there is profound diffuse encephalopathy, likely related to sedation. Dr. Pearlean Brownie was notified Charlsie Quest    DG Abd 1 View  Result Date: 03/03/2023 CLINICAL DATA:  Orogastric tube EXAM: ABDOMEN - 1 VIEW COMPARISON:  None Available. FINDINGS: Orogastric tube tip is in the gastric body. There are no dilated bowel loops. Visualized lung bases are clear. IMPRESSION: Orogastric tube tip is in the gastric body. Electronically Signed   By: Darliss Cheney M.D.   On: 03/03/2023 00:56   DG  Chest Port 1 View  Result Date: 03/03/2023 CLINICAL DATA:  Endotracheal tube EXAM: PORTABLE CHEST 1 VIEW COMPARISON:  Chest x-ray 07/30/2022 FINDINGS: Endotracheal tube tip is 3.9 cm above the carina. Right-sided central venous catheter has been removed. There is mild elevation of the left hemidiaphragm. There is no focal lung infiltrate, pleural effusion or pneumothorax. Cardiomediastinal silhouette is within normal limits. There surgical clips in the left upper extremity. IMPRESSION: 1. Endotracheal tube tip is 3.9 cm above the carina. 2. No focal lung infiltrate. Electronically Signed   By: Darliss Cheney M.D.   On: 03/03/2023 00:56   CT HEAD WO CONTRAST ( )  Result Date: 03/02/2023 CLINICAL DATA:  Hemorrhagic stroke EXAM: CT HEAD WITHOUT CONTRAST TECHNIQUE: Contiguous axial images were obtained from the base of the skull through the vertex without intravenous contrast. RADIATION DOSE REDUCTION: This exam was performed according to the departmental dose-optimization program which includes automated exposure control, adjustment of the mA and/or kV according to patient size and/or use of iterative reconstruction technique. COMPARISON:  11:54 a.m. FINDINGS: Brain: Acute intraparenchymal hematoma within the left parietal lobe is unchanged measuring 2.0 x 2.0 x 1.4 cm (volume = 3 cc). Stable mild surrounding vasogenic edema and effacement of the overlying sulci. No interval hemorrhage. Parenchymal volume loss is commensurate with the patient's age and stable since prior examination. Moderate periventricular and  subcortical white matter changes are present likely reflecting the sequela of small vessel ischemia. Remote lacunar infarcts are noted within the right thalamus, right lentiform nucleus, and right corona radiata, unchanged. Stable cortical encephalomalacia within the right parietal cortex. Ventricular size is normal and stable.  Cerebellum is unremarkable. Vascular: No asymmetric hyperdense vasculature  at the skull base. Moderate atherosclerotic calcification within the carotid siphons. Skull: Normal. Negative for fracture or focal lesion. Sinuses/Orbits: No acute finding. Other: Mastoid air cells and middle ear cavities are clear. IMPRESSION: 1. Stable acute intraparenchymal hematoma within the left parietal lobe measuring 2.0 x 2.0 x 1.4 cm (volume = 3 cc). No interval hemorrhage. 2. Stable senescent change 3. Stable right parietal cortical encephalomalacia. 4. Stable remote lacunar infarcts within the right thalamus, right lentiform nucleus, and right corona radiata. Electronically Signed   By: Helyn Numbers M.D.   On: 03/02/2023 21:03   EEG adult  Result Date: 03/02/2023 Rejeana Brock, MD     03/02/2023  6:55 PM History: 72 yo M with acute IPH and AMS Sedation: none Technique: This EEG was acquired with electrodes placed according to the International 10-20 electrode system (including Fp1, Fp2, F3, F4, C3, C4, P3, P4, O1, O2, T3, T4, T5, T6, A1, A2, Fz, Cz, Pz). The following electrodes were missing or displaced: none. Background: There is a posterior dominant rhythm of 8 Hz which is seen bilaterally.  On the left, there is also focal irregular high-voltage slow activity  P7 > O1 > P3 > P7,T7.  There is also diffuse irregular slow activity intruding in the background as well.  There is some sharp activity occasionally with the same distribution. There is waxing waning of this activity, but no definite evolution. Photic stimulation: Physiologic driving is now performed EEG Abnormalities: 1) left parieto-occipital sharp waves 2) left parieto-occipital focal cerebral dysfunction 3) generalized irregular slow activity Clinical Interpretation: This EEG is consistent with an area of focal cerebral dysfunction with the potential to serve as a seizure focus in the left parieto-occipital region.  There is overriding fast activity, and though this could simply represent an area of focal cerebral dysfunction  in the setting of hemorrhage, if there is clinical suspicion, would consider Ativan challenge. Ritta Slot, MD [facility] [phone] If 7pm- 7am, please page neurology on call as listed in AMION.   Vitals:   03/03/23 1515 03/03/23 1530 03/03/23 1545 03/03/23 1554  BP: (!) 151/82 (!) 152/82 (!) 156/81 (!) 166/83  Pulse: (!) 59 (!) 59 (!) 59 (!) 57  Resp: 15 15 15 15   Temp:      TempSrc:      SpO2: 99% 99% 98% 99%  Height:         PHYSICAL EXAM General:  Alert, well-nourished, well-developed elderly African-American male who is intubated sedated in no acute distress Psych:  Mood and affect appropriate for situation CV: Regular rate and rhythm on monitor Respiratory:  Regular, unlabored respirations on room air GI: Abdomen soft and nontender   NEURO:  Mental Status: Patient is sedated and intubated.  Eyes are closed.  Comatose.  Does not respond to follow commands.  Cranial Nerves:  II: Pupils bilateral sluggishly reactive.  Fundi not visualized.  Corneal reflexes are minimal bilaterally III, IV, VI: Doll's eye movement and absent bilaterally. V: Sensation cannot be tested due to sedation VII: Face is symmetrical resting  VIII: hearing i cannot be tested. IX, X: Palate not tested XII: tongue is midline without fasciculations. Motor: No significant motor  response to noxious stimuli Tone: is normal and bulk is normal Sensation-no response to noxious stimuli Coordination: Not tested  gait- deferred   ASSESSMENT/PLAN  Intraparenchymal Hemorrhage:  left parietal with electrographic focal seizure status Etiology:  possible hemorrhagic transformation due to eliquis for Afib  Code Stroke CT head  Acute IPH left parietal lobe, 2.2 x 1.4 x 2.0 cm.   Surrounding vasogenic edema, no midline shift. Remote encephalomalacia of right parietal lobe Multiple remote lacunar infarcts within the basal ganglia/thalami Repeat CT Head: Stable IPH within left parietal  lobe.  No interval hemorrhage.  MRI: pending, will attempt when stable.   2D Echo 7/3: EF 65-70%, left atrium is moderately dilated   TM EEG Read 7/15 am:  Electrographic status epilepticus, left hemisphere, maximal left parietal region Burst suppression with epileptiform bursts, left hemisphere, maximal left parietal region  LDL 68 HgbA1c 5.3  VTE prophylaxis - SCDs Eliquis (apixaban) daily prior to admission, now on No antithrombotic  due to IPH.   Therapy recommendations:  pending Disposition:  ICU  Hx of Stroke/TIA 02/18/2023 Remote infarcts in the right parietal lobe, basal ganglia and right frontal white matter.  Placed on Eliquis, was on this at home.  Discharged to CIR 7/5.  Seizure Disorder Status Epilepticus Keppra 500mg  BID at home, unknown compliance Started 08/03/2022 Add'l renal dose of 250mg  post-HD This admission 7/14: Possible focal status seen on EEG, VPA added, Vimpat 200mg  BID added 7/15 overnight: intubated to protect ariway. Loaded with phenobarb, stated on propofol. 7/15 AM: Status Epilepticus seen on EEG, Versed gtt added.  Seizure Precautions Continue all AEDs, burst suppression Will review LTM read in a.m. and adjust/wean AEDs from there  Acute Respiratory Failure Intubated overnight for airway protection Vent management per CCM, appreciate their assistance  Atrial fibrillation Hypertension Home Meds: eliquis , Cardizem, Amiodarone Continue telemetry monitoring Continue holding anticoagulation due to IPH.  Blood Pressure Goal: SBP less than 160   Hyperlipidemia Home meds:  Lipitor 40mg , on hold LDL 68, goal < 70 High intensity statin not indicated due to ICH Consider starting statin at discharge  Dysphagia Patient has post-stroke dysphagia, SLP consulted    Diet   Diet NPO time specified   Advance diet as tolerated  Other Active Problems ESRD with [facility]THS HD Dialysis orders, per nephrology  Hospital day # 1   Pt seen by Neuro  NP/APP and later by MD. Note/plan to be edited by MD as needed.    Lynnae January, DNP, AGACNP-BC [facility] Please use AMION for contact information & EPIC for messaging.  STROKE MD NOTE :  I have personally obtained history,examined this patient, reviewed notes, independently viewed imaging studies, participated in medical decision making and plan of care.ROS completed by me personally and pertinent positives fully documented  I have made any additions or clarifications directly to the above note. Agree with note above.  Patient with known history of partial seizures on Keppra with history of stroke on atrial fibrillation on Eliquis presented with altered mental status and was found to have a left parietal hemorrhage which was reversed with Andexxa with developed focal seizures and overnight has been found to be in partial status.  He has developed escalating care for his status with Keppra followed by Vimpat,, valproic acid and propofol and most recently Versed drip but continues to have breakthrough seizures on long-term EEG monitoring.  Prognosis is poor.  Case discussed with Dr. Melynda Ripple epileptologist and Dr. Denese Killings critical care medicine.  Long discussion with  patient's sister at the bedside and answered questions.  If seizures do not stop soon patient prognosis and making significant improvement is very bleak and he will likely need prolonged ventilatory support and feeding tube and 24-hour nursing care.  Patient's sister understands and she will get his children to come in for a family meeting to discuss goals of care.  Will consult palliative care team as well.  Continue aggressive seizure management for now but will need to reassess goals of care family meeting tomorrow.  Patient's dialysis fistula is also nonfunctional and may need temporary dialysis catheter.  Continue vasopressors as per critical care team.This patient is critically ill and at significant risk of neurological  worsening, death and care requires constant monitoring of vital signs, hemodynamics,respiratory and cardiac monitoring, extensive review of multiple databases, frequent neurological assessment, discussion with family, other specialists and medical decision making of high complexity.I have made any additions or clarifications directly to the above note.This critical care time does not reflect procedure time, or teaching time or supervisory time of PA/NP/Med Resident etc but could involve care discussion time.  I spent 60 minutes of neurocritical care time  in the care of  this patient.      Delia Heady, MD Medical Director [facility] Pager: [phone] 03/03/2023 5:05 PM   To contact Stroke Continuity provider, please refer to [email or web address]. After hours, contact General Neurology

## 2023-03-03 NOTE — Progress Notes (Signed)
Pt receives out-pt HD at [facility] on TTS. Will assist as needed.   Tracy Mounce Renal Navigator [phone] 

## 2023-03-03 NOTE — Progress Notes (Signed)
PT Cancellation Note  Patient Details Name: Tanner Rollins MRN: [id number] DOB: 09-02-1950   Cancelled Treatment:    Reason Eval/Treat Not Completed: Patient not medically ready (active bedrest order; still intubated)  Lillia Pauls, PT, DPT Acute Rehabilitation Services Office [phone]    Norval Morton 03/03/2023, 7:46 AM

## 2023-03-03 NOTE — Progress Notes (Signed)
Date and time results received: 03/03/23 1002 (use smartphrase ".now" to insert current time)  Test: Potassium Critical Value: 6.5  Name of Provider Notified: Agarwala  Orders Received? Or Actions Taken?: Orders Received - See Orders for details

## 2023-03-03 NOTE — Progress Notes (Signed)
OT Cancellation Note  Patient Details Name: Tanner Rollins MRN: [id number] DOB: Sep 27, 1950   Cancelled Treatment:    Reason Eval/Treat Not Completed: Patient not medically ready- intubated, bed rest order. Will follow and see when able/appropriate.    Barry Brunner, OT Acute Rehabilitation Services Office [phone]   [name] 03/03/2023, 11:30 AM

## 2023-03-03 NOTE — Procedures (Signed)
Central Venous Catheter Insertion Procedure Note  Carliss Quast  [id number]  09/18/1950  Date:03/03/23  Time:7:31 PM   Provider Performing:Meryl Ponder   Procedure: Insertion of Non-tunneled Central Venous Catheter(36556) with US guidance (70350)   Indication(s) Medication administration  Consent Unable to obtain consent due to emergent nature of procedure.  Anesthesia Topical only with 1% lidocaine   Timeout Verified patient identification, verified procedure, site/side was marked, verified correct patient position, special equipment/implants available, medications/allergies/relevant history reviewed, required imaging and test results available.  Sterile Technique Maximal sterile technique including full sterile barrier drape, hand hygiene, sterile gown, sterile gloves, mask, hair covering, sterile ultrasound probe cover (if used).  Procedure Description Area of catheter insertion was cleaned with chlorhexidine and draped in sterile fashion.  With real-time ultrasound guidance a central venous catheter was placed into the left internal jugular vein. Nonpulsatile blood flow and easy flushing noted in all ports.  The catheter was sutured in place and sterile dressing applied.    Complications/Tolerance None; patient tolerated the procedure well. Chest X-ray is ordered to verify placement for internal jugular or subclavian cannulation.   Chest x-ray is not ordered for femoral cannulation.  EBL Minimal  Specimen(s) None

## 2023-03-03 NOTE — Plan of Care (Signed)
EEG reviewed. Will increase propofol to 40. Will follow  -- Milon Dikes, MD Neurologist [facility] Pager: [phone]

## 2023-03-03 NOTE — Progress Notes (Signed)
Discussed with HD RN. HD seems to be running better but still with poor BFR (around 200). Can complete treatment today and hold off on temp line placement. Updated CCM. I do still believe that he needs to move forward with a fgram and he may very well end up with a temp line tomorrow. GOC/family meeting pending.

## 2023-03-03 NOTE — Progress Notes (Signed)
[facility] Progress Note    Assessment/ Plan:   NEW ICH = IN ICU , RX per CCM team  //with recent admit (CIR dc today ) for Acute R CVA (2 areas):Noted was on On Eliquis  for ho AFib  ESRD:  Usual TTS schedule  No hd needs today -  NO HEPARIN HD. Given hyperkalemia today, will plan for a short treatment with no UF today given that he is on low dose pressors. Will get him back on schedule tomorrow Hypertension/volume: euvolemic on exam Anemia: Hgb yest 10.5   no ESA for now Metabolic bone disease: PO4 at goal. Continue binders. Corrected Ca+ high. Hold VDRA.  Nutrition:  Alb low, Supplement po when ok  for pos  pA-Fib:  was On Eliquis + amiodarone(prior to ICH ). A/C on hold Status epilepticus: per neuro VDRF: intubated 7/15 for airway protection, per PCCM Shock: possible sedation effect, pressor support per PCCM, levo requirements decreasing  Outpatient Dialysis Orders: TTS at [facility] 4hr, 400/800, EDW 70kg, 2K/2Ca, AVF, 15g needles, - heparin 3000 units IV bolus as op ( FOR NO HEP HD NOW WITH  ICH 03/02/23 admit) - no recent ESA, Hgb typically > 11 - no VDRA at the moment, prev on Hectoral - held as of 6/29 for Ca 10.2  Subjective:   Patient seen and examined in ICU. Intubated overnight for airway protection. In status epilepticus. Currently on levo. Hypotensive, bradycardic overnight   Objective:   BP 110/74   Pulse (!) 44   Temp (!) 97.5 F (36.4 C) (Axillary)   Resp 15   SpO2 98%   Intake/Output Summary (Last 24 hours) at 03/03/2023 0954 Last data filed at 03/03/2023 0900 Gross per 24 hour  Intake 764.74 ml  Output --  Net 764.74 ml   Weight change:   Physical Exam: Gen: ill appearing, intubated/sedated CVS: RRR Resp: CTA B/L, intubated Abd: soft Ext: no edema Neuro: sedated Dialysis access: LUE AVF +b/t  Imaging: Overnight EEG with video  Result Date: 03/03/2023 Charlsie Quest, MD     03/03/2023  9:26 AM Patient Name: Tanner Rollins MRN:  [id number] Epilepsy Attending: Charlsie Quest Referring Physician/Provider: Elmer Picker, NP Duration: 03/02/2023 1501 to 03/03/2023 0915 Patient history: 72 yo M with acute IPH and AMS. EEG to evaluate for seizure Level of alertness:  comatose AEDs during EEG study: LEV, LCM , Phenobarb, Propofol Technical aspects: This EEG study was done with scalp electrodes positioned according to the 10-20 International system of electrode placement. Electrical activity was reviewed with band pass filter of 1-70Hz , sensitivity of 7 uV/mm, display speed of 10mm/sec with a 60Hz  notched filter applied as appropriate. EEG data were recorded continuously and digitally stored.  Video monitoring was available and reviewed as appropriate. Description: At the beginning of the study, EEG showed near continuous sharply contoured 6 to 9 Hz theta-alpha activity admixed with sharp waves in left hemisphere, maximal left parietal region with waxing and waning morphology and frequency as well as 1 to 3 seconds of generalized EEG suppression.  No clinical signs were noted.  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  Antiseizure medications were adjusted.  After around midnight on 03/03/2023, EEG showed burst suppression pattern with highly epileptiform bursts in left hemisphere, maximal left parietal region lasting 2 to 12 seconds as well as asynchronous low amplitude 3 to 5 Hz theta-delta slowing admixed with 12 to 13 Hz beta activity in right  hemisphere. The interburst interval was about 3 to 7 seconds.  Hyperventilation and photic stimulation were not performed.   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  Additionally there is profound diffuse encephalopathy, likely related to sedation. Dr. Pearlean Brownie was notified Charlsie Quest    DG Abd 1 View  Result Date: 03/03/2023 CLINICAL DATA:  Orogastric tube EXAM: ABDOMEN - 1 VIEW COMPARISON:  None Available. FINDINGS: Orogastric tube tip is in the gastric body. There are no dilated bowel loops. Visualized lung bases are clear. IMPRESSION: Orogastric tube tip is in the gastric body. Electronically Signed   By: Darliss Cheney M.D.   On: 03/03/2023 00:56   DG Chest Port 1 View  Result Date: 03/03/2023 CLINICAL DATA:  Endotracheal tube EXAM: PORTABLE CHEST 1 VIEW COMPARISON:  Chest x-ray 07/30/2022 FINDINGS: Endotracheal tube tip is 3.9 cm above the carina. Right-sided central venous catheter has been removed. There is mild elevation of the left hemidiaphragm. There is no focal lung infiltrate, pleural effusion or pneumothorax. Cardiomediastinal silhouette is within normal limits. There surgical clips in the left upper extremity. IMPRESSION: 1. Endotracheal tube tip is 3.9 cm above the carina. 2. No focal lung infiltrate. Electronically Signed   By: Darliss Cheney M.D.   On: 03/03/2023 00:56   CT HEAD WO CONTRAST ( )  Result Date: 03/02/2023 CLINICAL DATA:  Hemorrhagic stroke EXAM: CT HEAD WITHOUT CONTRAST TECHNIQUE: Contiguous axial images were obtained from the base of the skull through the vertex without intravenous contrast. RADIATION DOSE REDUCTION: This exam was performed according to the departmental dose-optimization program which includes automated exposure control, adjustment of the mA and/or kV according to patient size and/or use of iterative reconstruction technique. COMPARISON:  11:54 a.m. FINDINGS: Brain: Acute intraparenchymal hematoma within the left parietal lobe is unchanged measuring 2.0 x 2.0 x 1.4 cm (volume = 3 cc). Stable mild surrounding vasogenic edema and effacement of the overlying sulci. No interval hemorrhage.  Parenchymal volume loss is commensurate with the patient's age and stable since prior examination. Moderate periventricular and subcortical white matter changes are present likely reflecting the sequela of small vessel ischemia. Remote lacunar infarcts are noted within the right thalamus, right lentiform nucleus, and right corona radiata, unchanged. Stable cortical encephalomalacia within the right parietal cortex. Ventricular size is normal and stable.  Cerebellum is unremarkable. Vascular: No asymmetric hyperdense vasculature at the skull base. Moderate atherosclerotic calcification within the carotid siphons. Skull: Normal. Negative for fracture or focal lesion. Sinuses/Orbits: No acute finding. Other: Mastoid air cells and middle ear cavities are clear. IMPRESSION: 1. Stable acute intraparenchymal hematoma within the left parietal lobe measuring 2.0 x 2.0 x 1.4 cm (volume = 3 cc). No interval hemorrhage. 2. Stable senescent change 3. Stable right parietal cortical encephalomalacia. 4. Stable remote lacunar infarcts within the right thalamus, right lentiform nucleus, and right corona radiata. Electronically Signed   By: Helyn Numbers M.D.   On: 03/02/2023 21:03   EEG adult  Result Date: 03/02/2023 Rejeana Brock, MD     03/02/2023  6:55 PM History: 72 yo M with acute IPH and AMS Sedation: none Technique: This EEG was acquired with electrodes placed according to the International 10-20 electrode system (including Fp1, Fp2, F3, F4, C3, C4, P3, P4, O1, O2, T3, T4, T5, T6, A1, A2, Fz, Cz, Pz).  The following electrodes were missing or displaced: none. Background: There is a posterior dominant rhythm of 8 Hz which is seen bilaterally.  On the left, there is also focal irregular high-voltage slow activity  P7 > O1 > P3 > P7,T7.  There is also diffuse irregular slow activity intruding in the background as well.  There is some sharp activity occasionally with the same distribution. There is waxing waning of  this activity, but no definite evolution. Photic stimulation: Physiologic driving is now performed EEG Abnormalities: 1) left parieto-occipital sharp waves 2) left parieto-occipital focal cerebral dysfunction 3) generalized irregular slow activity Clinical Interpretation: This EEG is consistent with an area of focal cerebral dysfunction with the potential to serve as a seizure focus in the left parieto-occipital region.  There is overriding fast activity, and though this could simply represent an area of focal cerebral dysfunction in the setting of hemorrhage, if there is clinical suspicion, would consider Ativan challenge. Ritta Slot, MD [facility] [phone] If 7pm- 7am, please page neurology on call as listed in AMION.  CT HEAD CODE STROKE WO CONTRAST  Result Date: 03/02/2023 CLINICAL DATA:  Code stroke. Neuro deficit, acute, stroke suspected. EXAM: CT HEAD WITHOUT CONTRAST TECHNIQUE: Contiguous axial images were obtained from the base of the skull through the vertex without intravenous contrast. RADIATION DOSE REDUCTION: This exam was performed according to the departmental dose-optimization program which includes automated exposure control, adjustment of the mA and/or kV according to patient size and/or use of iterative reconstruction technique. COMPARISON:  CT head without contrast 02/18/2023. MR head without contrast 02/18/2023. FINDINGS: Brain: Acute parenchymal hemorrhage in the left parietal lobe measures 2.2 x 1.4 x 2.0 cm. There is some surrounding vasogenic edema and involvement effacement of the sulci. No midline shift is present. Remote encephalomalacia of the right parietal lobe is again noted. 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 Multiple remote lacunar infarcts are again noted within the basal ganglia and thalami. The brainstem and cerebellum are within normal limits. Vascular: Atherosclerotic  calcifications are present within the cavernous internal carotid arteries bilaterally. Calcifications are present the dural margin of both vertebral arteries. No hyperdense vessel is present. Skull: Calvarium is intact. No focal lytic or blastic lesions are present. No significant extracranial soft tissue lesion is present. Sinuses/Orbits: The paranasal sinuses and mastoid air cells are clear. The globes and orbits are within normal limits. IMPRESSION: 1. Acute parenchymal hemorrhage in the left parietal lobe measures 2.2 x 1.4 x 2.0 cm. There is some surrounding vasogenic edema and involvement of the sulci. No midline shift is present. 2. Remote encephalomalacia of the right parietal lobe is again noted. 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 3. Multiple remote lacunar infarcts within the basal ganglia and thalami. 4. Atherosclerosis. The above was relayed via text pager to Dr. Agnes Lawrence on 03/02/2023 at 12:06 . Electronically Signed   By: Marin Roberts M.D.   On: 03/02/2023 12:07    Labs: BMET Recent Labs  Lab 02/25/23 1150 03/01/23 0900 03/02/23 1839 03/03/23 0054 03/03/23 0416  NA 135 137 134* 130* 130*  K 4.0 4.4 4.4 5.4* 5.6*  CL 92* 95* 92*  --   --   CO2 26 24 23   --   --   GLUCOSE 85 150* 93  --   --   BUN 35* 28* 28*  --   --   CREATININE 13.27* 10.71*  8.60*  --   --   CALCIUM 10.0 9.8 10.1  --   --   PHOS 4.6 4.0  --   --   --    CBC Recent Labs  Lab 02/25/23 1150 03/01/23 0900 03/02/23 1839 03/03/23 0054 03/03/23 0416 03/03/23 0842  WBC 4.8 5.7 6.8  --   --  8.5  NEUTROABS  --   --  4.4  --   --   --   HGB 11.9* 10.5* 13.5 12.9* 12.9* 12.4*  HCT 36.0* 32.4* 41.2 38.0* 38.0* 36.4*  MCV 96.3 95.9 97.2  --   --  94.5  PLT 256 210 236  --   --  239    Medications:      stroke: early stages of recovery book   Does not apply Once   atorvastatin  40 mg Per Tube Daily   Chlorhexidine Gluconate Cloth   6 each Topical Daily   docusate  100 mg Per Tube BID   famotidine  20 mg Per Tube BID   feeding supplement (PROSource TF20)  60 mL Per Tube Daily   feeding supplement (VITAL HIGH PROTEIN)  1,000 mL Per Tube Q24H   LORazepam  2 mg Intravenous Once   pantoprazole (PROTONIX) IV  40 mg Intravenous QHS   polyethylene glycol  17 g Per Tube Daily   senna-docusate  1 tablet Per Tube BID      Anthony Sar, MD [facility] 03/03/2023, 9:54 AM

## 2023-03-03 NOTE — Procedures (Signed)
Cortrak  Tube Type:  Cortrak - 43 inches Tube Location:  Left nare Initial Placement:  Stomach Secured by: Bridle Technique Used to Measure Tube Placement:  Marking at nare/corner of mouth Cortrak Secured At:  75 cm   Cortrak Tube Team Note:  Consult received to place a Cortrak feeding tube.   X-ray is required, abdominal x-ray has been ordered by the Cortrak team. Please confirm tube placement before using the Cortrak tube.   If the tube becomes dislodged please keep the tube and contact the Cortrak team at [email or web address] for replacement.  If after hours and replacement cannot be delayed, place a NG tube and confirm placement with an abdominal x-ray.    Betsey Holiday MS, RD, LDN Please refer to [facility] for RD and/or RD on-call/weekend/after hours pager

## 2023-03-04 DIAGNOSIS — G40201 Localization-related (focal) (partial) symptomatic epilepsy and epileptic syndromes with complex partial seizures, not intractable, with status epilepticus: Secondary | ICD-10-CM

## 2023-03-04 DIAGNOSIS — Z7189 Other specified counseling: Secondary | ICD-10-CM

## 2023-03-04 DIAGNOSIS — R569 Unspecified convulsions: Secondary | ICD-10-CM | POA: Diagnosis not present

## 2023-03-04 DIAGNOSIS — Z515 Encounter for palliative care: Secondary | ICD-10-CM

## 2023-03-04 LAB — CBC
HCT: 33 % — ABNORMAL LOW (ref 39.0–52.0)
Hemoglobin: 10.9 g/dL — ABNORMAL LOW (ref 13.0–17.0)
MCH: 31.2 pg (ref 26.0–34.0)
MCHC: 33 g/dL (ref 30.0–36.0)
MCV: 94.6 fL (ref 80.0–100.0)
Platelets: 210 10*3/uL (ref 150–400)
RBC: 3.49 MIL/uL — ABNORMAL LOW (ref 4.22–5.81)
RDW: 14.7 % (ref 11.5–15.5)
WBC: 8.6 10*3/uL (ref 4.0–10.5)
nRBC: 0 % (ref 0.0–0.2)

## 2023-03-04 LAB — PHOSPHORUS
Phosphorus: 5.7 mg/dL — ABNORMAL HIGH (ref 2.5–4.6)
Phosphorus: 5.1 mg/dL — ABNORMAL HIGH (ref 2.5–4.6)

## 2023-03-04 LAB — GLUCOSE, CAPILLARY
Glucose-Capillary: 107 mg/dL — ABNORMAL HIGH (ref 70–99)
Glucose-Capillary: 112 mg/dL — ABNORMAL HIGH (ref 70–99)
Glucose-Capillary: 88 mg/dL (ref 70–99)
Glucose-Capillary: 94 mg/dL (ref 70–99)
Glucose-Capillary: 96 mg/dL (ref 70–99)
Glucose-Capillary: 98 mg/dL (ref 70–99)

## 2023-03-04 LAB — COMPREHENSIVE METABOLIC PANEL
ALT: 14 U/L (ref 0–44)
AST: 31 U/L (ref 15–41)
Albumin: 2.9 g/dL — ABNORMAL LOW (ref 3.5–5.0)
Alkaline Phosphatase: 86 U/L (ref 38–126)
Anion gap: 18 — ABNORMAL HIGH (ref 5–15)
BUN: 30 mg/dL — ABNORMAL HIGH (ref 8–23)
CO2: 23 mmol/L (ref 22–32)
Calcium: 8.3 mg/dL — ABNORMAL LOW (ref 8.9–10.3)
Chloride: 93 mmol/L — ABNORMAL LOW (ref 98–111)
Creatinine, Ser: 7.54 mg/dL — ABNORMAL HIGH (ref 0.61–1.24)
GFR, Estimated: 7 mL/min — ABNORMAL LOW (ref >60)
Glucose, Bld: 101 mg/dL — ABNORMAL HIGH (ref 70–99)
Potassium: 4.4 mmol/L (ref 3.5–5.1)
Sodium: 134 mmol/L — ABNORMAL LOW (ref 135–145)
Total Bilirubin: 0.4 mg/dL (ref 0.3–1.2)
Total Protein: 6.5 g/dL (ref 6.5–8.1)

## 2023-03-04 LAB — HEPATITIS B SURFACE ANTIBODY, QUANTITATIVE: Hep B S AB Quant (Post): 5560 m[IU]/mL

## 2023-03-04 LAB — MAGNESIUM
Magnesium: 1.6 mg/dL — ABNORMAL LOW (ref 1.7–2.4)
Magnesium: 2.3 mg/dL (ref 1.7–2.4)

## 2023-03-04 MED ORDER — MAGNESIUM SULFATE 2 GM/50ML IV SOLN
2.0000 g | Freq: Once | INTRAVENOUS | Status: AC
  Administered 2023-03-04: 2 g via INTRAVENOUS
  Filled 2023-03-04: qty 50

## 2023-03-04 MED ORDER — CHLORHEXIDINE GLUCONATE CLOTH 2 % EX PADS: 6.0000 | MEDICATED_PAD | Freq: Every day | CUTANEOUS | Status: DC

## 2023-03-04 MED ORDER — SODIUM CHLORIDE 0.9 % IV SOLN
0.5000 mg/h | INTRAVENOUS | Status: DC
  Administered 2023-03-04: 50 mg/h via INTRAVENOUS
  Administered 2023-03-04: 40 mg/h via INTRAVENOUS
  Administered 2023-03-04 – 2023-03-05 (×3): 50 mg/h via INTRAVENOUS
  Filled 2023-03-04 (×6): qty 50

## 2023-03-04 MED ORDER — ORAL CARE MOUTH RINSE: 15.0000 mL | OROMUCOSAL | Status: DC | PRN

## 2023-03-04 MED ORDER — CHLORHEXIDINE GLUCONATE CLOTH 2 % EX PADS
6.0000 | MEDICATED_PAD | Freq: Every day | CUTANEOUS | Status: DC
  Administered 2023-03-05 – 2023-03-06 (×3): 6 via TOPICAL

## 2023-03-04 MED ORDER — ORAL CARE MOUTH RINSE
15.0000 mL | OROMUCOSAL | Status: DC
  Administered 2023-03-04 – 2023-03-07 (×37): 15 mL via OROMUCOSAL

## 2023-03-04 NOTE — Consult Note (Signed)
Consultation Note Date: 03/04/2023   Patient Name: Tanner Rollins  DOB: 01/24/1951  MRN: [id number]  Age / Sex: 72 y.o., male  PCP: Pcp, No Referring Physician: Lynnell Catalan, MD  Reason for Consultation: Establishing goals of care  HPI/Patient Profile: 72 y.o. 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 Ongoing escalation of antiepileptics requiring ventilator support. Also complications with clotted fistula.   Clinical Assessment and Goals of Care: Consult received and chart review completed. Discussed with RN. Ilijah is sedated on ventilator support with warming blanket. Tolerating vent support.   I met with sister, Lyvonne. Sadie Haber and I review Daeshaun' declining health over the past ~3 years. He has been on dialysis ~1 year and this has been difficult on him. Sadie Haber shares that her mother had dialysis but did not have as difficult a time as Carvel has had. We spent time discussing seizures as a side effect that can occur secondary to brain injury such as stroke or bleeding. We spent some time reviewing that the significant amount of medications required to manage Giancarlos' seizures is concerning as he is still having some concerns for ongoing seizures despite significant medication and sedation. We reviewed concern for poor prognosis given the severity of his seizure activity.   We were joined by Dr. Pearlean Brownie as well as Dr. Denese Killings. They both explained Trevious' current status and plans to begin titration of midazolam and ketamine infusions tomorrow. They spent time explaining why weaning these medications are important to help [place] further evaluate Gregg' status but also the limited benefit of  continuing the heavily sedating medications for Kwame' overall prognosis and quality of life. Sadie Haber was given the opportunity to ask questions and express her concerns.   I spoke further with Lyvonne. We reviewed that we will have more information tomorrow once medication can begin to be weaned. We discussed that even best case scenario Mrk has been through significant health changes with stroke, seizures, and struggles with dialysis over the past year. I would still worry about how difficult his path forward would be. Sadie Haber shares that she has attempted to discuss with Coralyn Mark and his wishes but he would never discuss these topics. There is no paperwork. He does have 2 adult children who have been visiting. Sadie Haber shares that Rustyn has been very private and independent and they have recently become very close as she has been trying to help support him as he has had increasing health concerns and decline. We decided to plan for meeting with his 2 children Thursday 7/18 ~4pm as this will give [place] some time to see how everything goes tomorrow. There is anticipation that there may be difficult decisions to be made in the near future.   All questions/concerns addressed. Emotional support provided.   Primary Decision Maker NEXT OF KIN 2  adult children; sister Sadie Haber listed as emergency contact and has been primary caregiver and support. Recommend conference with all 3 parties.     SUMMARY OF RECOMMENDATIONS   - No decisions made today - Tentative family meeting planned for 7/18 ~4pm  Code Status/Advance Care Planning: Full code   Symptom Management:  Per stroke team, PCCM.   Prognosis:  Overall prognosis poor.   Discharge Planning: To Be Determined      Primary Diagnoses: Present on Admission:  ICH (intracerebral hemorrhage) (HCC)   I have reviewed the medical record, interviewed the patient and family, and examined the patient. The following aspects are  pertinent.  Past Medical History:  Diagnosis Date   ESRD on hemodialysis (HCC)    [facility]   Hypertension    PAF (paroxysmal atrial fibrillation) (HCC)    Seizure disorder (HCC)    Social History   Socioeconomic History   Marital status: Single    Spouse name: Not on file   Number of children: Not on file   Years of education: Not on file   Highest education level: Not on file  Occupational History   Not on file  Tobacco Use   Smoking status: Former    Current packs/day: 0.00    Types: Cigarettes    Quit date: 02/19/1990    Years since quitting: 33.0   Smokeless tobacco: Never  Vaping Use   Vaping status: Never Used  Substance and Sexual Activity   Alcohol use: Never   Drug use: Never   Sexual activity: Not on file  Other Topics Concern   Not on file  Social History Narrative   Not on file   Social Determinants of Health   Financial Resource Strain: Not on file  Food Insecurity: Patient Unable To Answer (02/18/2023)   Hunger Vital Sign    Worried About Running Out of Food in the Last Year: Patient unable to answer    Ran Out of Food in the Last Year: Patient unable to answer  Transportation Needs: Patient Unable To Answer (02/18/2023)   PRAPARE - Transportation    Lack of Transportation (Medical): Patient unable to answer    Lack of Transportation (Non-Medical): Patient unable to answer  Physical Activity: Not on file  Stress: Not on file  Social Connections: Not on file   No family history on file. Scheduled Meds:  atorvastatin  40 mg Per Tube Daily   Chlorhexidine Gluconate Cloth  6 each Topical Daily   docusate  100 mg Per Tube BID   famotidine  10 mg Per Tube Daily   feeding supplement (PROSource TF20)  60 mL Per Tube Daily   LORazepam  2 mg Intravenous Once   multivitamin  1 tablet Per Tube QHS   polyethylene glycol  17 g Per Tube Daily   senna-docusate  1 tablet Per Tube BID   Continuous Infusions:  sodium chloride     clevidipine Stopped  (03/02/23 2340)   feeding supplement (VITAL 1.5 CAL) 1,000 mL (03/04/23 0907)   ketamine (KETALAR) adult infusion 2.5 mg/kg/hr (03/04/23 0913)   lacosamide (VIMPAT) IV Stopped (03/03/23 2324)   levETIRAcetam     levETIRAcetam 500 mg (03/04/23 0906)   midazolam (VERSED) 250 mg in sodium chloride 0.9 % 250 mL (1 mg/mL) infusion 40 mg/hr (03/04/23 0921)   norepinephrine (LEVOPHED) Adult infusion Stopped (03/04/23 0725)   valproate sodium 350 mg (03/04/23 0925)   PRN Meds:.acetaminophen **OR** acetaminophen (TYLENOL) oral liquid 160 mg/5 mL **OR** acetaminophen, fentaNYL (SUBLIMAZE)  injection, fentaNYL (SUBLIMAZE) injection, labetalol, LORazepam, LORazepam, midazolam No Known Allergies Review of Systems  Unable to perform ROS: Acuity of condition    Physical Exam Vitals and nursing note reviewed.  Constitutional:      General: He is not in acute distress.    Appearance: He is ill-appearing.     Interventions: He is sedated and intubated.  Cardiovascular:     Rate and Rhythm: Normal rate.  Pulmonary:     Effort: No tachypnea, accessory muscle usage or respiratory distress. He is intubated.  Abdominal:     General: Abdomen is flat.     Palpations: Abdomen is soft.  Neurological:     Comments: Sedated on vent Continuous EEG     Vital Signs: BP 123/80   Pulse (!) 50   Temp (!) 91.8 F (33.2 C)   Resp 15   Ht 6' (1.829 m)   SpO2 98%   BMI 21.05 kg/m  Pain Scale: CPOT       SpO2: SpO2: 98 % O2 Device:SpO2: 98 % O2 Flow Rate: .   IO: Intake/output summary:  Intake/Output Summary (Last 24 hours) at 03/04/2023 1002 Last data filed at 03/04/2023 0800 Gross per 24 hour  Intake 1385.33 ml  Output 0 ml  Net 1385.33 ml    LBM: Last BM Date :  (PTA) Baseline Weight:   Most recent weight:       Palliative Assessment/Data:     Time Total: 75 min  Greater than 50%  of this time was spent counseling and coordinating care related to the above assessment and  plan.  Signed by: [name], NP Palliative Medicine Team Pager # [phone] (M-F 8a-5p) Team Phone # [phone] (Nights/Weekends)

## 2023-03-04 NOTE — Plan of Care (Signed)
IR was requested for image guided fistulogram due to possible clotted fistula.   Patient is 72 yo male with ESRD, seizure disorder, A fib, recent CVA on 02/18/23, currently admitted ICU due to ICH, intubated and on pressor.   Case was reviewed by Dr. Miles Costain, IR will not be able to offer much if his AVF is truly clotted due to recent ICH, recommends temporary HD cath placement.    Ordering provider notified, patient has pending GOC meeting with family today.  RN notified that patient can have TF from IR stand point.   No plan for fistulogram.   Please call IR for questions and concerns.   Lynann Bologna Keneth Borg PA-C 03/04/2023 8:35 AM

## 2023-03-04 NOTE — Procedures (Signed)
Central Venous Catheter Insertion Procedure Note  Tanner Rollins  [id number]  07/05/1951  Date:03/04/23  Time:9:05 AM   Provider Performing:Tawnie Ehresman   Procedure: Insertion of Non-tunneled Central Venous Catheter(36556)with US guidance (64332)    Indication(s) Hemodialysis  Consent Unable to obtain consent due to emergent nature of procedure.  Anesthesia none , patient already sedated  Timeout Verified patient identification, verified procedure, site/side was marked, verified correct patient position, special equipment/implants available, medications/allergies/relevant history reviewed, required imaging and test results available.  Sterile Technique Maximal sterile technique including full sterile barrier drape, hand hygiene, sterile gown, sterile gloves, mask, hair covering, sterile ultrasound probe cover (if used).  Procedure Description Area of catheter insertion was cleaned with chlorhexidine and draped in sterile fashion.   With real-time ultrasound guidance a HD catheter was placed into the right femoral vein.  Nonpulsatile blood flow and easy flushing noted in all ports.  The catheter was sutured in place and sterile dressing applied.    Complications/Tolerance None; patient tolerated the procedure well. Chest X-ray is ordered to verify placement for internal jugular or subclavian cannulation.  Chest x-ray is not ordered for femoral cannulation.  EBL Minimal  Specimen(s) None  Lynnell Catalan, MD [facility] ICU Physician [facility]  Critical Care  Pager: [phone] Or Epic Secure Chat After hours: [phone].  03/04/2023, 9:06 AM

## 2023-03-04 NOTE — TOC Initial Note (Addendum)
Transition of Care [facility]) - Initial/Assessment Note    Patient Details  Name: Tanner Rollins MRN: [id number] Date of Birth: 06/16/1951  Transition of Care [facility]) CM/SW Contact:    Mearl Latin, LCSW Phone Number: 03/04/2023, 4:18 PM  Clinical Narrative:                 Patient admitted from home with CIR stay prior to that. Patient is currently intubated. TOC will continue to follow. Please place consult if needs arise.     Barriers to Discharge: Continued Medical Work up   Patient Goals and CMS Choice            Expected Discharge Plan and Services                                              Prior Living Arrangements/Services   Lives with:: Self Patient language and need for interpreter reviewed:: Yes        Need for Family Participation in Patient Care: Yes (Comment) Care giver support system in place?: Yes (comment) Current home services: DME (walker/ wheelchair/ Cpap/ oxygen) Criminal Activity/Legal Involvement Pertinent to Current Situation/Hospitalization: No - Comment as needed  Activities of Daily Living      Permission Sought/Granted                  Emotional Assessment       Orientation: :  (Intubated) Alcohol / Substance Use: Not Applicable Psych Involvement: No (comment)  Admission diagnosis:  ICH (intracerebral hemorrhage) (HCC) [I61.9] Patient Active Problem List   Diagnosis Date Noted   Seizure (HCC) 03/03/2023   Protein-calorie malnutrition, severe 03/03/2023   ICH (intracerebral hemorrhage) (HCC) 03/02/2023   Left hip pain 02/28/2023   CVA (cerebral vascular accident) (HCC) 02/21/2023   Cerebral infarction (HCC) 02/18/2023   Seizure disorder (HCC) 07/31/2022   ESRD (end stage renal disease) on dialysis (HCC) 02/02/2022   A-fib (HCC) 02/02/2022   Metabolic bone disease 02/02/2022   Enlarged prostate without urinary obstruction 03/12/2021   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   Severe hypertension 03/11/2021    PCP:  Pcp, No Pharmacy:   [facility] - [place], [place] - [address] [place] [address] Phone: [phone] Fax: [phone]  [id number] - [place], [place] - [address] [place] [address] Phone: [phone] Fax: [phone]     Social Determinants of Health (SDOH) Social History: SDOH Screenings   Food Insecurity: Patient Unable To Answer (02/18/2023)  Housing: Patient Unable To Answer (02/18/2023)  Transportation Needs: Patient Unable To Answer (02/18/2023)  Utilities: Patient Unable To Answer (02/18/2023)  Tobacco Use: Medium Risk (02/21/2023)   SDOH Interventions:     Readmission Risk Interventions     No data to display

## 2023-03-04 NOTE — Progress Notes (Signed)
HD moved to 03/05/23 per Dr Thedore Mins

## 2023-03-04 NOTE — Progress Notes (Signed)
Still inadequately suppressed. Ketamine bolus dropped BP, briefly was on levo but this has been stopped again.   Increase versed from 30 to 40   Brooke Dare MD-PhD [facility] [phone]

## 2023-03-04 NOTE — Progress Notes (Signed)
[name] Progress Note    Assessment/ Plan:   NEW ICH = IN ICU , RX per CCM team  //with recent admit (CIR dc today ) for Acute R CVA (2 areas):Noted was on On Eliquis  for ho AFib  ESRD:  Usual TTS schedule  No hd needs today -  NO HEPARIN HD. S/p emergent HD 7/15 for hyperkalemia. Will attempt HD tomorrow and if there is any further access issues will need a temp HD catheter. HD is currently off schedule Hyperkalemia: improved with HD. HD attempt tomorrow Hypertension/volume: euvolemic on exam Anemia: Hgb 10.9   no ESA for now Metabolic bone disease: PO4 acceptable. Continue binders. Corrected Ca+ high. Hold VDRA.  Nutrition:  Alb low, Supplement po when ok  for pos  pA-Fib:  was On Eliquis + amiodarone(prior to ICH ). A/C on hold Status epilepticus: per neuro VDRF: intubated 7/15 for airway protection, per PCCM Shock: possible sedation effect, pressor support per PCCM, levo requirements decreasing GOC meeting planned for today with primary service.  Outpatient Dialysis Orders: TTS at [facility] 4hr, 400/800, EDW 70kg, 2K/2Ca, AVF, 15g needles, - heparin 3000 units IV bolus as op ( FOR NO HEP HD NOW WITH  ICH 03/02/23 admit) - no recent ESA, Hgb typically > 11 - no VDRA at the moment, prev on Hectoral - held as of 6/29 for Ca 10.2  Subjective:   Patient seen and examined in ICU. On very low dose pressors, remains in status epilepticus. Able to complete HD yesterday, discussed with HD RN today and was able to get BFR up slightly. IR intervention for fgram now feasible for patient given recent ICH and TDC not feasible given that he is on pressors.   Objective:   BP 104/73   Pulse (!) 58   Temp (!) 92.5 F (33.6 C)   Resp 15   Ht 6' (1.829 m)   SpO2 100%   BMI 21.05 kg/m   Intake/Output Summary (Last 24 hours) at 03/04/2023 1113 Last data filed at 03/04/2023 1000 Gross per 24 hour  Intake 1467.31 ml  Output 0 ml  Net 1467.31 ml   Weight change:   Physical  Exam: Gen: ill appearing, intubated/sedated CVS: RRR Resp: CTA B/L, intubated Abd: soft Ext: no edema Neuro: sedated Dialysis access: LUE AVF +b/t  Imaging: DG Abd Portable 1V  Result Date: 03/03/2023 CLINICAL DATA:  Feeding tube placement. EXAM: PORTABLE ABDOMEN - 1 VIEW COMPARISON:  03/03/2023. FINDINGS: Feeding tube terminates in the gastric antrum. Injection of contrast is confirmatory. Volume loss in the left lung base. IMPRESSION: 1. Feeding tube terminates in the gastric antrum. 2. Left basilar volume loss. Electronically Signed   By: Leanna Battles M.D.   On: 03/03/2023 16:06   DG CHEST PORT 1 VIEW  Result Date: 03/03/2023 CLINICAL DATA:  Central line placement EXAM: PORTABLE CHEST 1 VIEW COMPARISON:  03/03/2023, 12:10 a.m. FINDINGS: Interval placement of left neck vascular catheter, tip over the mid SVC. Interval placement of esophagogastric tube with tip and side port below the diaphragm. Endotracheal tube remains in unchanged position. Cardiomegaly. No acute airspace opacity. IMPRESSION: 1. Interval placement of left neck vascular catheter, tip over the mid SVC. No pneumothorax. 2. Interval placement of esophagogastric tube with tip and side port below the diaphragm. 3. Endotracheal tube remains in unchanged position. 4. Cardiomegaly. No acute airspace opacity. Electronically Signed   By: Jearld Lesch M.D.   On: 03/03/2023 12:36   Overnight EEG with video  Result Date:  03/03/2023 Charlsie Quest, MD     03/04/2023  8:56 AM Patient Name: Tanner Rollins MRN: [id number] Epilepsy Attending: Charlsie Quest Referring Physician/Provider: Elmer Picker, NP Duration: 03/02/2023 1501 to 03/03/2023 1501 Patient history: 72 yo M with acute IPH and AMS. EEG to evaluate for seizure Level of alertness:  comatose AEDs during EEG study: LEV, LCM , Phenobarb, Propofol Technical aspects: This EEG study was done with scalp electrodes positioned according to the 10-20 International system of electrode  placement. Electrical activity was reviewed with band pass filter of 1-70Hz , sensitivity of 7 uV/mm, display speed of 18mm/sec with a 60Hz  notched filter applied as appropriate. EEG data were recorded continuously and digitally stored.  Video monitoring was available and reviewed as appropriate. Description: At the beginning of the study, EEG showed near continuous sharply contoured 6 to 9 Hz theta-alpha activity admixed with sharp waves in left hemisphere, maximal left parietal region with waxing and waning morphology and frequency as well as 1 to 3 seconds of generalized EEG suppression.  No clinical signs were noted.  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  Antiseizure medications were adjusted.  After around midnight on 03/03/2023, EEG showed burst suppression pattern with highly epileptiform bursts in left hemisphere, maximal left parietal region lasting 2 to 12 seconds as well as asynchronous low amplitude 3 to 5 Hz theta-delta slowing admixed with 12 to 13 Hz beta activity in right hemisphere. The interburst interval was about 3 to 7 seconds.  Hyperventilation and photic stimulation were not performed.   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  Additionally there is profound diffuse encephalopathy, likely related to sedation. Dr. Pearlean Brownie was notified Charlsie Quest    DG Abd 1 View  Result Date: 03/03/2023 CLINICAL DATA:  Orogastric tube EXAM: ABDOMEN - 1 VIEW COMPARISON:  None Available. FINDINGS: Orogastric tube tip is in the gastric body. There are no dilated bowel loops. Visualized lung bases are clear. IMPRESSION: Orogastric tube tip is  in the gastric body. Electronically Signed   By: Darliss Cheney M.D.   On: 03/03/2023 00:56   DG Chest Port 1 View  Result Date: 03/03/2023 CLINICAL DATA:  Endotracheal tube EXAM: PORTABLE CHEST 1 VIEW COMPARISON:  Chest x-ray 07/30/2022 FINDINGS: Endotracheal tube tip is 3.9 cm above the carina. Right-sided central venous catheter has been removed. There is mild elevation of the left hemidiaphragm. There is no focal lung infiltrate, pleural effusion or pneumothorax. Cardiomediastinal silhouette is within normal limits. There surgical clips in the left upper extremity. IMPRESSION: 1. Endotracheal tube tip is 3.9 cm above the carina. 2. No focal lung infiltrate. Electronically Signed   By: Darliss Cheney M.D.   On: 03/03/2023 00:56   CT HEAD WO CONTRAST ( )  Result Date: 03/02/2023 CLINICAL DATA:  Hemorrhagic stroke EXAM: CT HEAD WITHOUT CONTRAST TECHNIQUE: Contiguous axial images were obtained from the base of the skull through the vertex without intravenous contrast. RADIATION DOSE REDUCTION: This exam was performed according to the departmental dose-optimization program which includes automated exposure control, adjustment of the mA and/or kV according to patient size and/or use of iterative reconstruction technique. COMPARISON:  11:54 a.m. FINDINGS: Brain: Acute intraparenchymal hematoma within the left parietal lobe is unchanged measuring 2.0 x 2.0 x 1.4 cm (  volume = 3 cc). Stable mild surrounding vasogenic edema and effacement of the overlying sulci. No interval hemorrhage. Parenchymal volume loss is commensurate with the patient's age and stable since prior examination. Moderate periventricular and subcortical white matter changes are present likely reflecting the sequela of small vessel ischemia. Remote lacunar infarcts are noted within the right thalamus, right lentiform nucleus, and right corona radiata, unchanged. Stable cortical encephalomalacia within the right parietal cortex. Ventricular size  is normal and stable.  Cerebellum is unremarkable. Vascular: No asymmetric hyperdense vasculature at the skull base. Moderate atherosclerotic calcification within the carotid siphons. Skull: Normal. Negative for fracture or focal lesion. Sinuses/Orbits: No acute finding. Other: Mastoid air cells and middle ear cavities are clear. IMPRESSION: 1. Stable acute intraparenchymal hematoma within the left parietal lobe measuring 2.0 x 2.0 x 1.4 cm (volume = 3 cc). No interval hemorrhage. 2. Stable senescent change 3. Stable right parietal cortical encephalomalacia. 4. Stable remote lacunar infarcts within the right thalamus, right lentiform nucleus, and right corona radiata. Electronically Signed   By: Helyn Numbers M.D.   On: 03/02/2023 21:03   EEG adult  Result Date: 03/02/2023 Rejeana Brock, MD     03/02/2023  6:55 PM History: 72 yo M with acute IPH and AMS Sedation: none Technique: This EEG was acquired with electrodes placed according to the International 10-20 electrode system (including Fp1, Fp2, F3, F4, C3, C4, P3, P4, O1, O2, T3, T4, T5, T6, A1, A2, Fz, Cz, Pz). The following electrodes were missing or displaced: none. Background: There is a posterior dominant rhythm of 8 Hz which is seen bilaterally.  On the left, there is also focal irregular high-voltage slow activity  P7 > O1 > P3 > P7,T7.  There is also diffuse irregular slow activity intruding in the background as well.  There is some sharp activity occasionally with the same distribution. There is waxing waning of this activity, but no definite evolution. Photic stimulation: Physiologic driving is now performed EEG Abnormalities: 1) left parieto-occipital sharp waves 2) left parieto-occipital focal cerebral dysfunction 3) generalized irregular slow activity Clinical Interpretation: This EEG is consistent with an area of focal cerebral dysfunction with the potential to serve as a seizure focus in the left parieto-occipital region.  There is  overriding fast activity, and though this could simply represent an area of focal cerebral dysfunction in the setting of hemorrhage, if there is clinical suspicion, would consider Ativan challenge. Ritta Slot, MD [facility] [phone] If 7pm- 7am, please page neurology on call as listed in AMION.  CT HEAD CODE STROKE WO CONTRAST  Result Date: 03/02/2023 CLINICAL DATA:  Code stroke. Neuro deficit, acute, stroke suspected. EXAM: CT HEAD WITHOUT CONTRAST TECHNIQUE: Contiguous axial images were obtained from the base of the skull through the vertex without intravenous contrast. RADIATION DOSE REDUCTION: This exam was performed according to the departmental dose-optimization program which includes automated exposure control, adjustment of the mA and/or kV according to patient size and/or use of iterative reconstruction technique. COMPARISON:  CT head without contrast 02/18/2023. MR head without contrast 02/18/2023. FINDINGS: Brain: Acute parenchymal hemorrhage in the left parietal lobe measures 2.2 x 1.4 x 2.0 cm. There is some surrounding vasogenic edema and involvement effacement of the sulci. No midline shift is present. Remote encephalomalacia of the right parietal lobe is again noted. 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 Multiple remote lacunar infarcts are again noted within the basal ganglia and thalami. The brainstem and  cerebellum are within normal limits. Vascular: Atherosclerotic calcifications are present within the cavernous internal carotid arteries bilaterally. Calcifications are present the dural margin of both vertebral arteries. No hyperdense vessel is present. Skull: Calvarium is intact. No focal lytic or blastic lesions are present. No significant extracranial soft tissue lesion is present. Sinuses/Orbits: The paranasal sinuses and mastoid air cells are clear. The globes and orbits are within normal limits.  IMPRESSION: 1. Acute parenchymal hemorrhage in the left parietal lobe measures 2.2 x 1.4 x 2.0 cm. There is some surrounding vasogenic edema and involvement of the sulci. No midline shift is present. 2. Remote encephalomalacia of the right parietal lobe is again noted. 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 3. Multiple remote lacunar infarcts within the basal ganglia and thalami. 4. Atherosclerosis. The above was relayed via text pager to Dr. Agnes Lawrence on 03/02/2023 at 12:06 . Electronically Signed   By: Marin Roberts M.D.   On: 03/02/2023 12:07    Labs: BMET Recent Labs  Lab 02/25/23 1150 03/01/23 0900 03/02/23 1839 03/03/23 0054 03/03/23 0416 03/03/23 0842 03/03/23 1700 03/04/23 0506  NA 135 137 134* 130* 130* 129*  --  134*  K 4.0 4.4 4.4 5.4* 5.6* 6.5*  --  4.4  CL 92* 95* 92*  --   --  87*  --  93*  CO2 26 24 23   --   --  22  --  23  GLUCOSE 85 150* 93  --   --  164*  --  101*  BUN 35* 28* 28*  --   --  35*  --  30*  CREATININE 13.27* 10.71* 8.60*  --   --  9.70*  --  7.54*  CALCIUM 10.0 9.8 10.1  --   --  9.0  --  8.3*  PHOS 4.6 4.0  --   --   --  4.6 2.5 5.7*   CBC Recent Labs  Lab 03/01/23 0900 03/02/23 1839 03/03/23 0054 03/03/23 0416 03/03/23 0842 03/04/23 0506  WBC 5.7 6.8  --   --  8.5 8.6  NEUTROABS  --  4.4  --   --   --   --   HGB 10.5* 13.5 12.9* 12.9* 12.4* 10.9*  HCT 32.4* 41.2 38.0* 38.0* 36.4* 33.0*  MCV 95.9 97.2  --   --  94.5 94.6  PLT 210 236  --   --  239 210    Medications:     atorvastatin  40 mg Per Tube Daily   Chlorhexidine Gluconate Cloth  6 each Topical Daily   docusate  100 mg Per Tube BID   famotidine  10 mg Per Tube Daily   feeding supplement (PROSource TF20)  60 mL Per Tube Daily   LORazepam  2 mg Intravenous Once   multivitamin  1 tablet Per Tube QHS   mouth rinse  15 mL Mouth Rinse Q2H   polyethylene glycol  17 g Per Tube Daily   senna-docusate  1 tablet  Per Tube BID      Anthony Sar, MD [facility] 03/04/2023, 11:13 AM

## 2023-03-04 NOTE — Procedures (Signed)
Patient Name: Murl Golladay  MRN: [id number]  Epilepsy Attending: Charlsie Quest  Referring Physician/Provider: Elmer Picker, NP  Duration: 03/03/2023 1501 to 03/04/2023 1501   Patient history: 72 yo M with acute IPH and AMS. EEG to evaluate for seizure   Level of alertness:  comatose   AEDs during EEG study: LEV, LCM , Phenobarb, Versed, Ketamine   Technical aspects: This EEG study was done with scalp electrodes positioned according to the 10-20 International system of electrode placement. Electrical activity was reviewed with band pass filter of 1-70Hz , sensitivity of 7 uV/mm, display speed of 38mm/sec with a 60Hz  notched filter applied as appropriate. EEG data were recorded continuously and digitally stored.  Video monitoring was available and reviewed as appropriate.   Description: At the beginning of the study, EEG showed burst suppression pattern with highly epileptiform bursts in left hemisphere, maximal left parietal region lasting 2 to 12 seconds as well as asynchronous low amplitude 3 to 5 Hz theta-delta slowing admixed with 12 to 13 Hz beta activity in right hemisphere. The interburst interval was about 3 to 7 seconds. Gradually as versed and ketamine was adjusted, the morphology of bursts improved and showed predominantly 3-7Hz  theta-delta slowing admixed with spikes in left hemisphere, maximal left parietal region. There was also low amplitude 3 to 5 Hz theta-delta slowing admixed with 12 to 13 Hz beta activity in right hemisphere. The interburst interval was about 2-4 seconds.  Hyperventilation and photic stimulation were not performed.      ABNORMALITY -Burst suppression with epileptiform bursts, left hemisphere, maximal left parietal region   IMPRESSION: This study showed evidence of epileptogenicity arising from left hemisphere, maximal left parietal region. This EEG pattern is on the ictal-interictal continuum with increased risk of seizure recurrence. Additionally there is  profound diffuse encephalopathy, likely related to sedation.   Katleen Carraway Annabelle Harman

## 2023-03-04 NOTE — Progress Notes (Signed)
LTM maint complete - no skin breakdown seen; serviced A2 [facility] monitored, Event button test confirmed by [facility].

## 2023-03-04 NOTE — Progress Notes (Signed)
LTM maint complete - no skin breakdown under: Fp1 F3F7 .[facility] monitored, Event button test confirmed by [facility].  

## 2023-03-04 NOTE — Progress Notes (Signed)
PT Cancellation Note  Patient Details Name: Kayler Rise MRN: [id number] DOB: August 13, 1951   Cancelled Treatment:    Reason Eval/Treat Not Completed: Patient not medically ready (OT discussed with RN; pt intubated and sedated. Will sign off at this time. Please re-consult if status changes).  Lillia Pauls, PT, DPT Acute Rehabilitation Services Office [phone]    Norval Morton 03/04/2023, 10:17 AM

## 2023-03-04 NOTE — Progress Notes (Addendum)
STROKE TEAM PROGRESS NOTE   BRIEF HPI [name] is a 72 y.o. male with history of new onset seizures recently started on dual dose Keppra, atrial fibrillation Eliquis, ESRD on HD presenting with left parietal lobe hemorrhage on CT, which was reversed with Andexxa.  Admitted 02/18/2023 for Right corona radiata infarcts, discharged 7/5 to CIR.    SIGNIFICANT HOSPITAL EVENTS 7/14: Possible focal status seen on EEG, VPA added, Vimpat 200mg  BID added 7/15 overnight: intubated to protect ariway. Loaded with phenobarb, stated on propofol.  Per chart notes, at least 2 attempts were made to contact patient's sister overnight with no answer.  (Will attempt throughout day today to speak with family) 7/15 AM: Status Epilepticus seen on EEG, Versed gtt added.   INTERIM HISTORY/SUBJECTIVE On exam, patient is intubated and heavily sedated on Versed @ 40mg  and increased by CCM to 50mg , and on ketamine @ 2.5mg . He is also on small amount of Levophed. CCM and RN at bedside. Bedside EEG montotr yet showing PLEDs every 2-3 secs.Plan of care discussed.  EEG read burst suppression with epileptiform bursts, left hemisphere, maximal left parietal region  Neurological exam is limited due to sedation but no witnessed obvious clinical seizure activity Plan to meet with family later today and discuss GOC. Palliative care team consult pending  CBC    Component Value Date/Time   WBC 8.6 03/04/2023 0506   RBC 3.49 (L) 03/04/2023 0506   HGB 10.9 (L) 03/04/2023 0506   HCT 33.0 (L) 03/04/2023 0506   PLT 210 03/04/2023 0506   MCV 94.6 03/04/2023 0506   MCH 31.2 03/04/2023 0506   MCHC 33.0 03/04/2023 0506   RDW 14.7 03/04/2023 0506   LYMPHSABS 1.5 03/02/2023 1839   MONOABS 0.8 03/02/2023 1839   EOSABS 0.1 03/02/2023 1839   BASOSABS 0.1 03/02/2023 1839    BMET    Component Value Date/Time   NA 134 (L) 03/04/2023 0506   K 4.4 03/04/2023 0506   CL 93 (L) 03/04/2023 0506   CO2 23 03/04/2023 0506    GLUCOSE 101 (H) 03/04/2023 0506   BUN 30 (H) 03/04/2023 0506   CREATININE 7.54 (H) 03/04/2023 0506   CALCIUM 8.3 (L) 03/04/2023 0506   GFRNONAA 7 (L) 03/04/2023 0506    IMAGING past 24 hours DG Abd Portable 1V  Result Date: 03/03/2023 CLINICAL DATA:  Feeding tube placement. EXAM: PORTABLE ABDOMEN - 1 VIEW COMPARISON:  03/03/2023. FINDINGS: Feeding tube terminates in the gastric antrum. Injection of contrast is confirmatory. Volume loss in the left lung base. IMPRESSION: 1. Feeding tube terminates in the gastric antrum. 2. Left basilar volume loss. Electronically Signed   By: Leanna Battles M.D.   On: 03/03/2023 16:06    Vitals:   03/04/23 1200 03/04/23 1215 03/04/23 1230 03/04/23 1245  BP: 118/77 100/69 97/69 101/69  Pulse: 60 65 67 65  Resp: 15 15 15 15   Temp: (!) 94.5 F (34.7 C) (!) 94.8 F (34.9 C) (!) 95.4 F (35.2 C) (!) 95.7 F (35.4 C)  TempSrc:      SpO2: 100% 100% 100% 100%  Height:         PHYSICAL EXAM General:  Alert, well-nourished, well-developed elderly African-American male who is intubated sedated in no acute distress Psych:  Mood and affect appropriate for situation CV: Regular rate and rhythm on monitor Respiratory:  Regular, unlabored respirations on room air GI: Abdomen soft and nontender   NEURO:  Mental Status: Patient is sedated and intubated.  Eyes are closed.  Comatose.  Does not respond to follow commands.  Cranial Nerves:  II: Pupils bilateral sluggishly reactive.  Fundi not visualized.  Corneal reflexes are absent III, IV, VI: Doll's eye movement and absent bilaterally. V: Sensation cannot be tested due to sedation VII: Face is symmetrical resting  VIII: hearing cannot be tested. IX, X: Palate not tested XII: tongue is midline without fasciculations. Motor: No significant motor response to noxious stimuli Tone: is normal and bulk is normal Sensation-no response to noxious stimuli Coordination: Not tested  gait-  deferred   ASSESSMENT/PLAN  Intraparenchymal Hemorrhage:  left parietal with electrographic focal seizure status multidrug resistant refractory Etiology:  possible hemorrhagic transformation due to eliquis for Afib  Code Stroke CT head  Acute IPH left parietal lobe, 2.2 x 1.4 x 2.0 cm.   Surrounding vasogenic edema, no midline shift. Remote encephalomalacia of right parietal lobe Multiple remote lacunar infarcts within the basal ganglia/thalami Repeat CT Head: Stable IPH within left parietal lobe.  No interval hemorrhage.  MRI: pending, will attempt when stable.   2D Echo 7/3: EF 65-70%, left atrium is moderately dilated   LTM EEG Read 7/15 am:  Electrographic status epilepticus, left hemisphere, maximal left parietal region Burst suppression with epileptiform bursts, left hemisphere, maximal left parietal region LTM 7/16:Burst suppression with epileptiform bursts, left hemisphere, maximal left parietal region   This study showed evidence of epileptogenicity arising from left hemisphere, maximal left parietal region. This EEG pattern is on the ictal-interictal continuum with increased risk of seizure recurrence. Additionally there is profound diffuse encephalopathy, likely related to sedation.   LDL 68 HgbA1c 5.3  VTE prophylaxis - SCDs Eliquis (apixaban) daily prior to admission, now on No antithrombotic  due to IPH.   Therapy recommendations:  pending Disposition:  ICU  Hx of Stroke/TIA 02/18/2023 Remote infarcts in the right parietal lobe, basal ganglia and right frontal white matter.  Placed on Eliquis, was on this at home.  Discharged to CIR 7/5.  Seizure Disorder Status Epilepticus Keppra 500mg  BID at home, unknown compliance Started 08/03/2022 Add'l renal dose of 250mg  post-HD This admission 7/14: Possible focal status seen on EEG, VPA added, Vimpat 200mg  BID added 7/15 overnight: intubated to protect ariway. Loaded with phenobarb, stated on propofol. 7/15 AM:  Status Epilepticus seen on EEG, Versed gtt added.  Seizure Precautions Continue all AEDs, burst suppression Will review LTM read in a.m. and adjust/wean AEDs from there  Acute Respiratory Failure Intubated overnight for airway protection Vent management per CCM, appreciate their assistance  Atrial fibrillation Hypertension Home Meds: eliquis , Cardizem, Amiodarone Continue telemetry monitoring Continue holding anticoagulation due to IPH.  Blood Pressure Goal: SBP less than 160   Hyperlipidemia Home meds:  Lipitor 40mg , on hold LDL 68, goal < 70 High intensity statin not indicated due to ICH Consider starting statin at discharge  Dysphagia Patient has post-stroke dysphagia, SLP consulted    Diet   Diet NPO time specified Except for: Sips with Meds   Advance diet as tolerated  Other Active Problems ESRD with TTHS HD Dialysis orders, per nephrology  Hospital day # 2   Pt seen by Neuro NP/APP and later by MD. Note/plan to be edited by MD as needed.    Gevena Mart DNP, ACNPC-AG  [facility]  STROKE MD NOTE :  I have personally obtained history,examined this patient, reviewed notes, independently viewed imaging studies, participated in medical decision making and plan of care.ROS completed by me personally and pertinent positives fully documented  I have  made any additions or clarifications directly to the above note. Agree with note above. Patient remains in refractory partial seizure status epilepticus despite being on keppra, Vimpat, propofol, versed and ketamine. Prognosis is poor stopping seizures completely. Will discuss with family and likely optimize versed and ketamine drip today for 24 hours more and will begin lifting sedation tomorrow as there is nothing more left to do . Pentobarb coma would entail prolonged hemodynamic and ventilatory support with unlikely great recovery.  Case discussed with Dr. Melynda Ripple epileptologist as well as Dr. Denese Killings critical care  medicine. This patient is critically ill and at significant risk of neurological worsening, death and care requires constant monitoring of vital signs, hemodynamics,respiratory and cardiac monitoring, extensive review of multiple databases, frequent neurological assessment, discussion with family, other specialists and medical decision making of high complexity.I have made any additions or clarifications directly to the above note.This critical care time does not reflect procedure time, or teaching time or supervisory time of PA/NP/Med Resident etc but could involve care discussion time.  I spent 35 minutes of neurocritical care time  in the care of  this patient.     Delia Heady, MD Medical Director [facility] Pager: [phone] 03/04/2023 4:26 PM   To contact Stroke Continuity provider, please refer to [email or web address]. After hours, contact General Neurology

## 2023-03-04 NOTE — Progress Notes (Signed)
NAME:  Tanner Rollins, MRN:  [id number], DOB:  May 04, 1951, LOS: 2 ADMISSION DATE:  03/02/2023, CONSULTATION DATE:  03/02/2023 REFERRING MD:  Dr Wilford Corner, MD, CHIEF COMPLAINT:  Seizures, Encephalopathy   History of Present Illness:  72 year old man with hypertension and end-stage renal disease on HD, seizure disorder, atrial fibrillation on Eliquis with medical noncompliance.  He was admitted 7/2-7/5 with right corona radiata CVAs.  He completed rehab and then was discharged home on 9/14 on Keppra, Eliquis.  After leaving the hospital he was noted to slump over in the car and was brought back for emergent evaluation.  Head CT showed an acute parenchymal hemorrhage and left parietal lobe 2.2 x 1.4 x 2.0 cm with some surrounding vasogenic edema.  He was admitted to 4N., course complicated by left-sided seizure activity.  He was loaded and continued on valproate, continued Keppra.  Subsequently continuous EEG continues to show seizures and he received Vimpat.  There was some slowing of epileptogenic discharges but seizures continued.  Some associated encephalopathy, suppressed mental status, hypotension and bradycardia.  PCCM consulted for airway protection so he can undergo burst suppression.   Pertinent  Medical History  ESRD on HD/ HTN/ pAFIB/ Seizure Disorder  Significant Hospital Events: Including procedures, antibiotic start and stop dates in addition to other pertinent events   7/14 Head CT > acute parenchymal hemorrhage left parietal lobe 2.2 x 1.4 x 2.0 cm with some surrounding vasogenic edema and effacement of the sulci.  No midline shift present.  Multiple remote lacunar infarcts noted in the basal ganglia and thalami 7/15 Intubated overnight for airway protection, remains in status this am despite propofol, keppra, vimpat, and valproate. Adding versed drip   Interim History / Subjective:  Pt RASS -4 to -5 sedated on ketamine and versed gtt, intermittent levo gtt as needed for goal MAP 65 or  greater. LTM in place for neurology goal to achieve burst suppression   Objective   Blood pressure 123/80, pulse (!) 50, temperature (!) 91.8 F (33.2 C), resp. rate 15, height 6' (1.829 m), SpO2 98%.    Vent Mode: PRVC FiO2 (%):  [40 %] 40 % Set Rate:  [15 bmp] 15 bmp Vt Set:  [580 mL] 580 mL PEEP:  [5 cmH20] 5 cmH20 Plateau Pressure:  [14 cmH20-15 cmH20] 14 cmH20   Intake/Output Summary (Last 24 hours) at 03/04/2023 0911 Last data filed at 03/04/2023 0800 Gross per 24 hour  Intake 1447.09 ml  Output 0 ml  Net 1447.09 ml   There were no vitals filed for this visit.  Examination: Physical Exam HENT:     Head: Normocephalic.     Mouth/Throat:     Mouth: Mucous membranes are moist.     Pharynx: Oropharynx is clear.  Eyes:     Comments: Pupils 3 and sluggish bilaterally  Cardiovascular:     Rate and Rhythm: Normal rate and regular rhythm.     Pulses: Normal pulses.     Heart sounds: Normal heart sounds.  Pulmonary:     Effort: Pulmonary effort is normal.     Breath sounds: Normal breath sounds.  Abdominal:     General: Bowel sounds are normal.     Palpations: Abdomen is soft.  Skin:    General: Skin is warm.     Capillary Refill: Capillary refill takes less than 2 seconds.  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 Amyloid vasculopathy Right corona radiata CVAs Multifactorial encephalopathy, due to the above as well as sedating meds / AED -Currently on ketamine and versed gtt for sedation RASS goal -4 to -5 with achievement of burst suppression on LTM -Anticoagulation remains on hold  -Repeat images per neuro  -Maintain neuro protective measures; goal for eurothermia, euglycemia, eunatermia,  normoxia, and PCO2 goal of 35-40 -Nutrition and bowel regiment  -Seizure precautions  -Aspirations precautions  -MRI pending- complete when able -LTM -Goal to trial wakeup of Patient and titration down of sedation beginning over the next 24-48 hours   Acute respiratory failure due to impaired airway protection -Continue ventilator support with lung protective strategies  -Wean PEEP and FiO2 for sats greater than 90%. -Head of bed elevated 30 degrees. -Plateau pressures less than 30 cm H20.  -Follow intermittent chest x-ray and ABG.   -SAT/SBT as tolerated, mentation preclude extubation  -Ensure adequate pulmonary hygiene  -Follow cultures  -VAP bundle in place  -PAD protocol   ESRD, received HD on 7/14 Notably pt had difficulties accessing fistula (thought to have clotted due to recent ICH) and ordered for a fistulogram with IR, however IR unable to perform at this time and recommended temporary HD cath insertion. Inserted by PCCM team 7/16 -Consult nephrology, appreciate reqs -Follow renal function  -Avoid nephrotoxins -Ensure adequate renal perfusion    Atrial fibrillation, currently bradycardic on monitor Hx HTN -Anticoagulation remains on hold  -Continuous telemetry  -Home antihypertension on hold   At risk malnutrition -Start tube feeds  -Protein supplementation   Best Practice (right click and "Reselect all SmartList Selections" daily)   Diet/type: NPO DVT prophylaxis: SCD GI prophylaxis: PPI Lines: yes and it is still needed temp HD line Foley:  Yes, and it is still needed Code Status:  full code Last date of multidisciplinary goals of care discussion [pending]  Labs   CBC: Recent Labs  Lab 02/25/23 1150 03/01/23 0900 03/02/23 1839 03/03/23 0054 03/03/23 0416 03/03/23 0842 03/04/23 0506  WBC 4.8 5.7 6.8  --   --  8.5 8.6  NEUTROABS  --   --  4.4  --   --   --   --   HGB 11.9* 10.5* 13.5 12.9* 12.9* 12.4* 10.9*  HCT 36.0* 32.4* 41.2 38.0* 38.0* 36.4*  33.0*  MCV 96.3 95.9 97.2  --   --  94.5 94.6  PLT 256 210 236  --   --  239 210    Basic Metabolic Panel: Recent Labs  Lab 02/25/23 1150 03/01/23 0900 03/02/23 1839 03/03/23 0054 03/03/23 0416 03/03/23 0842 03/03/23 1700 03/04/23 0506  NA 135 137 134* 130* 130* 129*  --  134*  K 4.0 4.4 4.4 5.4* 5.6* 6.5*  --  4.4  CL 92* 95* 92*  --   --  87*  --  93*  CO2 26 24 23   --   --  22  --  23  GLUCOSE 85 150* 93  --   --  164*  --  101*  BUN 35* 28* 28*  --   --  35*  --  30*  CREATININE 13.27* 10.71* 8.60*  --   --  9.70*  --  7.54*  CALCIUM 10.0 9.8 10.1  --   --  9.0  --  8.3*  MG  --   --   --   --   --  1.8 1.6* 1.6*  PHOS 4.6 4.0  --   --   --  4.6 2.5 5.7*   GFR: Estimated Creatinine Clearance: 9.1 mL/min (A) (by C-G formula based on SCr of 7.54 mg/dL (H)). Recent Labs  Lab 03/01/23 0900 03/02/23 1839 03/03/23 0842 03/04/23 0506  WBC 5.7 6.8 8.5 8.6    Liver Function Tests: Recent Labs  Lab 02/25/23 1150 03/01/23 0900 03/02/23 1839 03/04/23 0506  AST  --   --  14* 31  ALT  --   --  11 14  ALKPHOS  --   --  101 86  BILITOT  --   --  0.5 0.4  PROT  --   --  8.0 6.5  ALBUMIN 3.4* 3.2* 3.9 2.9*   No results for input(s): "LIPASE", "AMYLASE" in the last 168 hours. No results for input(s): "AMMONIA" in the last 168 hours.  ABG    Component Value Date/Time   PHART 7.501 (H) 03/03/2023 0416   PCO2ART 34.4 03/03/2023 0416   PO2ART 93 03/03/2023 0416   HCO3 27.0 03/03/2023 0416   TCO2 28 03/03/2023 0416   ACIDBASEDEF 12.0 (H) 01/24/2022 0611   O2SAT 98 03/03/2023 0416     Coagulation Profile: Recent Labs  Lab 03/02/23 1839  INR 1.4*    Cardiac Enzymes: No results for input(s): "CKTOTAL", "CKMB", "CKMBINDEX", "TROPONINI" in the last 168 hours.  HbA1C: Hgb A1c MFr Bld  Date/Time Value Ref Range Status  02/19/2023 04:34 AM 5.3 4.8 - 5.6 % Final    Comment:    (NOTE) Pre diabetes:          5.7%-6.4%  Diabetes:              >6.4%  Glycemic  control for   <7.0% adults with diabetes   02/02/2022 09:46 PM 5.4 4.8 - 5.6 % Final    Comment:    (NOTE) Pre diabetes:          5.7%-6.4%  Diabetes:              >6.4%  Glycemic control for   <7.0% adults with diabetes     CBG: Recent Labs  Lab 03/03/23 1535 03/03/23 2001 03/04/23 0044 03/04/23 0516 03/04/23 0750  GLUCAP 80 80 88 96 98    Review of Systems:   Negative except as listed in HPI and POC  Past Medical History:  He,  has a past medical history of ESRD on hemodialysis (HCC), Hypertension, PAF (paroxysmal atrial fibrillation) (HCC), and Seizure disorder (HCC).   Surgical History:   Past Surgical History:  Procedure Laterality Date   ANGIOPLASTY Left 06/10/2022   Procedure: Balloon VENOPLASTY;  Surgeon: Chuck Hint, MD;  Location: [facility] OR;  Service: Vascular;  Laterality: Left;   AV FISTULA PLACEMENT Left 01/28/2022   Procedure: ARTERIOVENOUS (AV) FISTULA CREATION VS.GRAFT ARM;  Surgeon: Chuck Hint, MD;  Location: [facility] OR;  Service: Vascular;  Laterality: Left;   FISTULOGRAM Left 06/10/2022   Procedure: FISTULOGRAM;  Surgeon: Chuck Hint, MD;  Location: [facility] OR;  Service: Vascular;  Laterality: Left;   IR FLUORO GUIDE CV LINE RIGHT  01/24/2022   IR REMOVAL TUN CV CATH W/O FL  10/04/2022   IR THORACENTESIS ASP PLEURAL SPACE W/IMG GUIDE  02/12/2022   IR US GUIDE VASC ACCESS RIGHT  01/24/2022     Social History:   reports that he quit smoking about 33 years ago. His smoking use included cigarettes. He has never used smokeless tobacco. He  reports that he does not drink alcohol and does not use drugs.   Family History:  His family history is not on file.   Allergies No Known Allergies   Home Medications  Prior to Admission medications   Medication Sig Start Date End Date Taking? Authorizing Provider  acetaminophen (TYLENOL) 500 MG tablet Take 1 tablet (500 mg total) by mouth every 6 (six) hours as needed for moderate pain or  headache. 02/28/23   Love, Evlyn Kanner, PA-C  amiodarone (PACERONE) 200 MG tablet Take 1 tablet (200 mg total) by mouth daily. 02/28/23 03/30/23  Love, Evlyn Kanner, PA-C  apixaban (ELIQUIS) 5 MG TABS tablet Take 1 tablet (5 mg total) by mouth 2 (two) times daily. 02/28/23   Love, Evlyn Kanner, PA-C  atorvastatin (LIPITOR) 40 MG tablet Take 1 tablet (40 mg total) by mouth daily. 02/28/23 03/30/23  Love, Evlyn Kanner, PA-C  diltiazem (CARDIZEM CD) 120 MG 24 hr capsule Take 1 capsule (120 mg total) by mouth daily. 03/01/23   Love, Evlyn Kanner, PA-C  levETIRAcetam (KEPPRA) 500 MG tablet Take 1 tablet (500 mg total) by mouth daily. Take addition 250 mg (1/2 tablet) after HD on TTS 03/01/23   Love, Evlyn Kanner, PA-C  melatonin 5 MG TABS Take 1 tablet (5 mg total) by mouth at bedtime. 02/28/23   Love, Evlyn Kanner, PA-C  Multiple Vitamin (MULTIVITAMIN WITH MINERALS) TABS tablet Take 1 tablet by mouth daily.    [provider]  sevelamer carbonate (RENVELA) 800 MG tablet Take 2 tablets (1,600 mg total) by mouth 3 (three) times daily with meals. 02/28/23   Love, Evlyn Kanner, PA-C  Vitamin D, Ergocalciferol, (DRISDOL) 1.25 MG (50000 UNIT) CAPS capsule Take 1 capsule (50,000 Units total) by mouth every 7 (seven) days. 03/04/23   Jacquelynn Cree, PA-C     Critical care time: 45 minutes    Janeece Riggers, AGACNP-BC [facility] For pager details, please see AMION or use EPIC chat After 1900, please call [facility] for cross coverage needs 03/04/2023 9:11 AM

## 2023-03-04 NOTE — Progress Notes (Signed)
OT Cancellation Note  Patient Details Name: Larson Limones MRN: [id number] DOB: Aug 16, 1951   Cancelled Treatment:    Reason Eval/Treat Not Completed: Patient not medically ready- pt intubated and sedated.  Spoke to RN who recommends we sign off at this time.  When stable, and appropriate, please re-consult.    Barry Brunner, OT Acute Rehabilitation Services Office [phone]   [name] 03/04/2023, 9:01 AM

## 2023-03-05 ENCOUNTER — Inpatient Hospital Stay (HOSPITAL_COMMUNITY): Payer: No Typology Code available for payment source

## 2023-03-05 DIAGNOSIS — I612 Nontraumatic intracerebral hemorrhage in hemisphere, unspecified: Secondary | ICD-10-CM | POA: Diagnosis not present

## 2023-03-05 LAB — BASIC METABOLIC PANEL
Anion gap: 14 (ref 5–15)
BUN: 49 mg/dL — ABNORMAL HIGH (ref 8–23)
CO2: 21 mmol/L — ABNORMAL LOW (ref 22–32)
Calcium: 7.9 mg/dL — ABNORMAL LOW (ref 8.9–10.3)
Chloride: 99 mmol/L (ref 98–111)
Creatinine, Ser: 8.96 mg/dL — ABNORMAL HIGH (ref 0.61–1.24)
GFR, Estimated: 6 mL/min — ABNORMAL LOW (ref >60)
Glucose, Bld: 130 mg/dL — ABNORMAL HIGH (ref 70–99)
Potassium: 4.7 mmol/L (ref 3.5–5.1)
Sodium: 134 mmol/L — ABNORMAL LOW (ref 135–145)

## 2023-03-05 LAB — CBC
HCT: 30.3 % — ABNORMAL LOW (ref 39.0–52.0)
Hemoglobin: 10 g/dL — ABNORMAL LOW (ref 13.0–17.0)
MCH: 31.9 pg (ref 26.0–34.0)
MCHC: 33 g/dL (ref 30.0–36.0)
MCV: 96.8 fL (ref 80.0–100.0)
Platelets: 176 10*3/uL (ref 150–400)
RBC: 3.13 MIL/uL — ABNORMAL LOW (ref 4.22–5.81)
RDW: 14.9 % (ref 11.5–15.5)
WBC: 15.6 10*3/uL — ABNORMAL HIGH (ref 4.0–10.5)
nRBC: 0 % (ref 0.0–0.2)

## 2023-03-05 LAB — GLUCOSE, CAPILLARY
Glucose-Capillary: 102 mg/dL — ABNORMAL HIGH (ref 70–99)
Glucose-Capillary: 104 mg/dL — ABNORMAL HIGH (ref 70–99)
Glucose-Capillary: 112 mg/dL — ABNORMAL HIGH (ref 70–99)
Glucose-Capillary: 113 mg/dL — ABNORMAL HIGH (ref 70–99)
Glucose-Capillary: 140 mg/dL — ABNORMAL HIGH (ref 70–99)
Glucose-Capillary: 75 mg/dL (ref 70–99)
Glucose-Capillary: 85 mg/dL (ref 70–99)

## 2023-03-05 LAB — PROCALCITONIN: Procalcitonin: 3 ng/mL

## 2023-03-05 LAB — MAGNESIUM: Magnesium: 2.3 mg/dL (ref 1.7–2.4)

## 2023-03-05 MED ORDER — AMIODARONE HCL 200 MG PO TABS
200.0000 mg | ORAL_TABLET | Freq: Every day | ORAL | Status: DC
  Administered 2023-03-05 – 2023-03-07 (×3): 200 mg
  Filled 2023-03-05 (×3): qty 1

## 2023-03-05 MED ORDER — VITAMIN D 25 MCG (1000 UNIT) PO TABS
2000.0000 [IU] | ORAL_TABLET | Freq: Every day | ORAL | Status: DC
  Administered 2023-03-05 – 2023-03-07 (×3): 2000 [IU]
  Filled 2023-03-05 (×3): qty 2

## 2023-03-05 MED ORDER — LEVETIRACETAM IN NACL 500 MG/100ML IV SOLN: 500.0000 mg | INTRAVENOUS | Status: DC

## 2023-03-05 MED ORDER — LEVETIRACETAM IN NACL 1000 MG/100ML IV SOLN
1000.0000 mg | INTRAVENOUS | Status: DC
  Administered 2023-03-05 – 2023-03-06 (×2): 1000 mg via INTRAVENOUS
  Filled 2023-03-05 (×2): qty 100

## 2023-03-05 NOTE — Procedures (Addendum)
Patient Name: Tanner Rollins  MRN: [id number]  Epilepsy Attending: Charlsie Quest  Referring Physician/Provider: Elmer Picker, NP  Duration: 03/04/2023 1501 to 03/05/2023 2100   Patient history: 72 yo M with acute IPH and AMS. EEG to evaluate for seizure   Level of alertness:  comatose   AEDs during EEG study: LEV, LCM ,VPA, Versed, Ketamine   Technical aspects: This EEG study was done with scalp electrodes positioned according to the 10-20 International system of electrode placement. Electrical activity was reviewed with band pass filter of 1-70Hz , sensitivity of 7 uV/mm, display speed of 80mm/sec with a 60Hz  notched filter applied as appropriate. EEG data were recorded continuously and digitally stored.  Video monitoring was available and reviewed as appropriate.   Description: EEG showed burst suppression pattern with asynchronous bursts of predominantly 3-7Hz  theta-delta slowing admixed with spikes in left hemisphere, maximal left parietal region. There was also low amplitude 3 to 5 Hz theta-delta slowing admixed with 12 to 13 Hz beta activity in right hemisphere. The interburst interval was about 2-4 seconds.  Gradually, the bursts became more synchronous and showed generalized 3-6hz  theta-delta slowing admixed with 12-13hz  beta activity lasting 3-7 seconds alternating with 2-3 seconds of generalized suppression. Hyperventilation and photic stimulation were not performed.     EEG was disconnected between 03/05/2023 1435 to 2030 for MRI Brain.    ABNORMALITY - Burst suppression, generalized - Spikes, left hemisphere, maximal left parietal region   IMPRESSION: This study showed evidence of epileptogenicity arising from left hemisphere, maximal left parietal region and increased risk of seizure recurrence. Additionally there is profound diffuse encephalopathy, likely related to sedation. No seizures were noted.  EEG has improved compared to previous day.   Zamyiah Tino Annabelle Harman

## 2023-03-05 NOTE — Progress Notes (Signed)
NAME:  Tanner Rollins, MRN:  [id number], DOB:  [birth year], LOS: 3 ADMISSION DATE:  03/02/2023, CONSULTATION DATE:  03/02/2023 REFERRING MD:  Dr Wilford Corner, MD, CHIEF COMPLAINT:  Seizures, Encephalopathy   History of Present Illness:  72 year old man with hypertension and end-stage renal disease on HD, seizure disorder, atrial fibrillation on Eliquis with medical noncompliance. He was admitted 7/2-7/5 with right corona radiata CVAs. He completed rehab and then was discharged home on 9/14 on Keppra, Eliquis. After leaving the hospital he was noted to slump over in the car and was brought back for emergent evaluation. Head CT showed an acute parenchymal hemorrhage and left parietal lobe 2.2 x 1.4 x 2.0 cm with some surrounding vasogenic edema. He was admitted to 4N., course complicated by left-sided seizure activity. He was loaded and continued on valproate, continued Keppra. Subsequently continuous EEG continues to show seizures and he received Vimpat. There was some slowing of epileptogenic discharges but seizures continued. Some associated encephalopathy, suppressed mental status, hypotension and bradycardia. PCCM consulted for airway protection so he can undergo burst suppression.   Pertinent  Medical History  Hypertension/pAFIB/seizure disorder/ESRD on HD  Significant Hospital Events: Including procedures, antibiotic start and stop dates in addition to other pertinent events   7/14 Head CT > acute parenchymal hemorrhage left parietal lobe 2.2 x 1.4 x 2.0 cm with some surrounding vasogenic edema and effacement of the sulci.  No midline shift present.  Multiple remote lacunar infarcts noted in the basal ganglia and thalami 7/15 Intubated overnight for airway protection, remains in status this am despite propofol, keppra, vimpat, and valproate. Adding versed drip  7/16 on Ketamine and Versed infusion for goal Burst suppression over next 24 hours 7/17 wean sedation over next 24-48 hours, continues on Long  term EEG  Interim History / Subjective:  Patient unable to participate in subjective history as RASS -4 to -5 sedated on ketamine and Versed drip  Objective   Blood pressure 120/67, pulse 87, temperature 99.5 F (37.5 C), resp. rate 17, height 6' (1.829 m), weight 71.1 kg, SpO2 96%.    Vent Mode: PRVC FiO2 (%):  [40 %] 40 % Set Rate:  [15 bmp] 15 bmp Vt Set:  [580 mL] 580 mL PEEP:  [5 cmH20] 5 cmH20 Plateau Pressure:  [16 cmH20-18 cmH20] 16 cmH20   Intake/Output Summary (Last 24 hours) at 03/05/2023 1106 Last data filed at 03/05/2023 1100 Gross per 24 hour  Intake 2858.93 ml  Output --  Net 2858.93 ml   Filed Weights   03/05/23 0500  Weight: 71.1 kg    Examination: Physical Exam Eyes:     Pupils: Pupils are equal, round, and reactive to light.  Cardiovascular:     Rate and Rhythm: Normal rate and regular rhythm.     Pulses: Normal pulses.     Heart sounds: Normal heart sounds.  Pulmonary:     Comments: PRVC ventilation, lungs CTA BL  Abdominal:     General: Bowel sounds are normal.     Palpations: Abdomen is soft.  Skin:    General: Skin is warm.     Capillary Refill: Capillary refill takes less than 2 seconds.  Neurological:     Comments: RASS -4 to -5 not following any commands or withdrawing to noxious stimuli.       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  Amyloid vasculopathy Right corona  radiata CVA Multifactorial encephalopathy Due to status epilepticus and ICH as well as sedating meds/AEDs -Patient maintains on ketamine and Versed drip, wean as tolerated over the next 24 to 48 hours -Maintain neuroprotective measures: goal for eurothermia, euglycemia, eunatermia, normoxia, and PCO2 goal of 35-40  -Seizure precautions -Aspiration precautions -MRI pending-complete unable (currently attempting to wean  sedation and remove many if not prior to MRI) -Continuous EEG -Wean sedation over next 24 to 48 hours   Acute respiratory failure due to impaired airway protection Pt was electively intubated for mechanical ventilation and airway protection 7/14 -Continue ventilator support with lung protective strategies -Wean PEEP and FiO2 for sats greater than 90% -Maintain head of bed elevation to 30 degrees -Plateau pressures less than 30 cm H2O -SAT/SBT as tolerated, mentation precludes extubation-currently weaning sedation -Ensure adequate pulmonary hygiene -Follow cultures -VAP bundle in place -PAD protocol   Leukocytosis Possibly related to hypothermia and rewarming vs acute infection; although higher suspicion of leukocytosis secondary to rewarming of pt post hypothermia. -Procal pending -CXR pending -Continue to trend WBC with AM labs   Hypothermia Pt arrived to hospital normothermic however developed hypothermia as low as 91.71F overnight 7/15 into 7/16. Unclear etiology at this time however likely secondary to sedation medications for burst suppression and mechanical intubation vs therapeutic hypothermia to assist in recovery of patient with known ICH. Today 7/17 Patient remains on Bair hugger warming therapy.  Temperature has been uptrending over the last 24 hours with latest temperature 99.1 F.  Seen as low as 96.1 Fahrenheit over the last 24 hours.  -Continue Bair hugger warming therapy   Hypotension Hypotension likely in setting of sedation use. Pt on minimal dose of levophed. -Levophed gtt for MAP goal >65 or SBP >90   Atrial fibrillation History of hypertension with active Hypotension Notably the patient takes Eliquis in the outpatient setting-hold in setting of ICH.  Heart rate has been controlled 75 to 90 bpm over the last 24 hours. -Continue telemetry monitoring -Home antihypertensive medications currently on hold in setting of acute hypotension requiring Levophed  drip -Resume Eliquis anticoagulation   ESRD on HD Patient last received HD briefly on 7/15.  Notably had difficult accessing and temporary HD cath was inserted 7/16 -Nephrology following, appreciate recommendations and dialysis schedule -Avoid nephrotoxic agents -Renally dose medications -Ensure adequate renal perfusion -Continue to monitor renal function with daily BMP   Nutrition -Continue tube feeds with vital 1.5 Cal for goal of 55 mL/HR -Prosource feeding supplementation -Bowel regimen  GOC Plan for family meeting 7/18 for ongoing goals of care discussion.   Best Practice (right click and "Reselect all SmartList Selections" daily)   Diet/type: NPO DVT prophylaxis: SCD GI prophylaxis: PPI Lines: yes and it is still needed Foley:  Yes, and it is still needed Code Status:  full code Last date of multidisciplinary goals of care discussion [Pending]  Labs   CBC: Recent Labs  Lab 03/01/23 0900 03/02/23 1839 03/03/23 0054 03/03/23 0416 03/03/23 0842 03/04/23 0506 03/05/23 0554  WBC 5.7 6.8  --   --  8.5 8.6 15.6*  NEUTROABS  --  4.4  --   --   --   --   --   HGB 10.5* 13.5 12.9* 12.9* 12.4* 10.9* 10.0*  HCT 32.4* 41.2 38.0* 38.0* 36.4* 33.0* 30.3*  MCV 95.9 97.2  --   --  94.5 94.6 96.8  PLT 210 236  --   --  239 210 176    Basic Metabolic Panel: Recent  Labs  Lab 03/01/23 0900 03/02/23 1839 03/03/23 0054 03/03/23 0416 03/03/23 0842 03/03/23 1700 03/04/23 0506 03/04/23 1656 03/05/23 0554  NA 137 134* 130* 130* 129*  --  134*  --  134*  K 4.4 4.4 5.4* 5.6* 6.5*  --  4.4  --  4.7  CL 95* 92*  --   --  87*  --  93*  --  99  CO2 24 23  --   --  22  --  23  --  21*  GLUCOSE 150* 93  --   --  164*  --  101*  --  130*  BUN 28* 28*  --   --  35*  --  30*  --  49*  CREATININE 10.71* 8.60*  --   --  9.70*  --  7.54*  --  8.96*  CALCIUM 9.8 10.1  --   --  9.0  --  8.3*  --  7.9*  MG  --   --   --   --  1.8 1.6* 1.6* 2.3 2.3  PHOS 4.0  --   --   --  4.6 2.5  5.7* 5.1*  --    GFR: Estimated Creatinine Clearance: 7.7 mL/min (A) (by C-G formula based on SCr of 8.96 mg/dL (H)). Recent Labs  Lab 03/02/23 1839 03/03/23 0842 03/04/23 0506 03/05/23 0554  WBC 6.8 8.5 8.6 15.6*    Liver Function Tests: Recent Labs  Lab 03/01/23 0900 03/02/23 1839 03/04/23 0506  AST  --  14* 31  ALT  --  11 14  ALKPHOS  --  101 86  BILITOT  --  0.5 0.4  PROT  --  8.0 6.5  ALBUMIN 3.2* 3.9 2.9*   No results for input(s): "LIPASE", "AMYLASE" in the last 168 hours. No results for input(s): "AMMONIA" in the last 168 hours.  ABG    Component Value Date/Time   PHART 7.501 (H) 03/03/2023 0416   PCO2ART 34.4 03/03/2023 0416   PO2ART 93 03/03/2023 0416   HCO3 27.0 03/03/2023 0416   TCO2 28 03/03/2023 0416   ACIDBASEDEF 12.0 (H) 01/24/2022 0611   O2SAT 98 03/03/2023 0416     Coagulation Profile: Recent Labs  Lab 03/02/23 1839  INR 1.4*    Cardiac Enzymes: No results for input(s): "CKTOTAL", "CKMB", "CKMBINDEX", "TROPONINI" in the last 168 hours.  HbA1C: Hgb A1c MFr Bld  Date/Time Value Ref Range Status  02/19/2023 04:34 AM 5.3 4.8 - 5.6 % Final    Comment:    (NOTE) Pre diabetes:          5.7%-6.4%  Diabetes:              >6.4%  Glycemic control for   <7.0% adults with diabetes   02/02/2022 09:46 PM 5.4 4.8 - 5.6 % Final    Comment:    (NOTE) Pre diabetes:          5.7%-6.4%  Diabetes:              >6.4%  Glycemic control for   <7.0% adults with diabetes     CBG: Recent Labs  Lab 03/04/23 1556 03/04/23 1921 03/05/23 0009 03/05/23 0529 03/05/23 0805  GLUCAP 107* 94 104* 140* 102*    Review of Systems:   Negative except as listed in HPI and POC.  Past Medical History:  He,  has a past medical history of ESRD on hemodialysis (HCC), Hypertension, PAF (paroxysmal atrial fibrillation) (HCC), and Seizure disorder (HCC).  Surgical History:   Past Surgical History:  Procedure Laterality Date   ANGIOPLASTY Left  06/10/2022   Procedure: Balloon VENOPLASTY;  Surgeon: Chuck Hint, MD;  Location: [facility] OR;  Service: Vascular;  Laterality: Left;   AV FISTULA PLACEMENT Left 01/28/2022   Procedure: ARTERIOVENOUS (AV) FISTULA CREATION VS.GRAFT ARM;  Surgeon: Chuck Hint, MD;  Location: [facility] OR;  Service: Vascular;  Laterality: Left;   FISTULOGRAM Left 06/10/2022   Procedure: FISTULOGRAM;  Surgeon: Chuck Hint, MD;  Location: [facility] OR;  Service: Vascular;  Laterality: Left;   IR FLUORO GUIDE CV LINE RIGHT  01/24/2022   IR REMOVAL TUN CV CATH W/O FL  10/04/2022   IR THORACENTESIS ASP PLEURAL SPACE W/IMG GUIDE  02/12/2022   IR US GUIDE VASC ACCESS RIGHT  01/24/2022     Social History:   reports that he quit smoking about 33 years ago. His smoking use included cigarettes. He has never used smokeless tobacco. He reports that he does not drink alcohol and does not use drugs.   Family History:  His family history is not on file.   Allergies No Known Allergies   Home Medications  Prior to Admission medications   Medication Sig Start Date End Date Taking? Authorizing Provider  acetaminophen (TYLENOL) 500 MG tablet Take 1 tablet (500 mg total) by mouth every 6 (six) hours as needed for moderate pain or headache. 02/28/23   Love, Evlyn Kanner, PA-C  amiodarone (PACERONE) 200 MG tablet Take 1 tablet (200 mg total) by mouth daily. 02/28/23 03/30/23  Love, Evlyn Kanner, PA-C  apixaban (ELIQUIS) 5 MG TABS tablet Take 1 tablet (5 mg total) by mouth 2 (two) times daily. 02/28/23   Love, Evlyn Kanner, PA-C  atorvastatin (LIPITOR) 40 MG tablet Take 1 tablet (40 mg total) by mouth daily. 02/28/23 03/30/23  Love, Evlyn Kanner, PA-C  diltiazem (CARDIZEM CD) 120 MG 24 hr capsule Take 1 capsule (120 mg total) by mouth daily. 03/01/23   Love, Evlyn Kanner, PA-C  levETIRAcetam (KEPPRA) 500 MG tablet Take 1 tablet (500 mg total) by mouth daily. Take addition 250 mg (1/2 tablet) after HD on TTS 03/01/23   Love, Evlyn Kanner, PA-C  melatonin 5  MG TABS Take 1 tablet (5 mg total) by mouth at bedtime. 02/28/23   Love, Evlyn Kanner, PA-C  Multiple Vitamin (MULTIVITAMIN WITH MINERALS) TABS tablet Take 1 tablet by mouth daily.    [provider]  sevelamer carbonate (RENVELA) 800 MG tablet Take 2 tablets (1,600 mg total) by mouth 3 (three) times daily with meals. 02/28/23   Love, Evlyn Kanner, PA-C  Vitamin D, Ergocalciferol, (DRISDOL) 1.25 MG (50000 UNIT) CAPS capsule Take 1 capsule (50,000 Units total) by mouth every 7 (seven) days. 03/04/23   Jacquelynn Cree, PA-C     Critical care time: 35 minutes    Janeece Riggers, AGACNP-BC [facility] For pager details, please see AMION or use EPIC chat After 1900, please call [facility] for cross coverage needs 03/05/2023 11:06 AM

## 2023-03-05 NOTE — Progress Notes (Signed)
Went to administer medication to pt and noticed pt was unhooked from EEG. EEG tech called and stated they were told to remove EEG wires for an MRI. No MRI scheduled at this time. Coordinated MRI with respiratory but not available to go until 5pm. MD made aware.

## 2023-03-05 NOTE — Progress Notes (Signed)
[facility] Progress Note    Assessment/ Plan:   NEW ICH = IN ICU , RX per CCM team  //with recent admit (CIR dc today ) for Acute R CVA (2 areas):Noted was on On Eliquis  for ho AFib  ESRD:  Usual TTS schedule  No hd needs today -  NO HEPARIN HD. S/p emergent HD 7/15 for hyperkalemia. Will attempt HD today and if there is any further access issues will need a temp HD catheter, fortunately there is a stronger bruit and thrill in his AVF. HD is currently off schedule Hyperkalemia: improved with HD. HD attempt today Hypertension/volume: euvolemic on exam Anemia: Hgb 10   no ESA for now Metabolic bone disease: PO4 acceptable. Continue binders. Corrected Ca+ high. Hold VDRA.  Nutrition:  Alb low, Supplement po when ok  for pos  pA-Fib:  was On Eliquis + amiodarone(prior to ICH ). A/C on hold Status epilepticus: per neuro VDRF: intubated 7/15 for airway protection, per PCCM Shock: possible sedation effect, pressor support per PCCM, no low dose levo Family meeting with palliative care on 7/18.  Outpatient Dialysis Orders: TTS at [facility] 4hr, 400/800, EDW 70kg, 2K/2Ca, AVF, 15g needles, - heparin 3000 units IV bolus as op ( FOR NO HEP HD NOW WITH  ICH 03/02/23 admit) - no recent ESA, Hgb typically > 11 - no VDRA at the moment, prev on Hectoral - held as of 6/29 for Ca 10.2  Subjective:   Patient seen and examined in ICU. On levo. No acute events. GOC pending   Objective:   BP 133/75   Pulse 85   Temp 99.3 F (37.4 C)   Resp 17   Ht 6' (1.829 m)   Wt 71.1 kg   SpO2 99%   BMI 21.26 kg/m   Intake/Output Summary (Last 24 hours) at 03/05/2023 1249 Last data filed at 03/05/2023 1200 Gross per 24 hour  Intake 2852.97 ml  Output --  Net 2852.97 ml   Weight change:   Physical Exam: Gen: ill appearing, intubated/sedated CVS: RRR Resp: CTA B/L, intubated Abd: soft Ext: no edema Neuro: sedated Dialysis access: LUE AVF +b/t  Imaging: DG Abd Portable 1V  Result  Date: 03/03/2023 CLINICAL DATA:  Feeding tube placement. EXAM: PORTABLE ABDOMEN - 1 VIEW COMPARISON:  03/03/2023. FINDINGS: Feeding tube terminates in the gastric antrum. Injection of contrast is confirmatory. Volume loss in the left lung base. IMPRESSION: 1. Feeding tube terminates in the gastric antrum. 2. Left basilar volume loss. Electronically Signed   By: Leanna Battles M.D.   On: 03/03/2023 16:06    Labs: BMET Recent Labs  Lab 03/01/23 0900 03/02/23 1839 03/03/23 0054 03/03/23 0416 03/03/23 0842 03/03/23 1700 03/04/23 0506 03/04/23 1656 03/05/23 0554  NA 137 134* 130* 130* 129*  --  134*  --  134*  K 4.4 4.4 5.4* 5.6* 6.5*  --  4.4  --  4.7  CL 95* 92*  --   --  87*  --  93*  --  99  CO2 24 23  --   --  22  --  23  --  21*  GLUCOSE 150* 93  --   --  164*  --  101*  --  130*  BUN 28* 28*  --   --  35*  --  30*  --  49*  CREATININE 10.71* 8.60*  --   --  9.70*  --  7.54*  --  8.96*  CALCIUM 9.8 10.1  --   --  9.0  --  8.3*  --  7.9*  PHOS 4.0  --   --   --  4.6 2.5 5.7* 5.1*  --    CBC Recent Labs  Lab 03/02/23 1839 03/03/23 0054 03/03/23 0416 03/03/23 0842 03/04/23 0506 03/05/23 0554  WBC 6.8  --   --  8.5 8.6 15.6*  NEUTROABS 4.4  --   --   --   --   --   HGB 13.5   < > 12.9* 12.4* 10.9* 10.0*  HCT 41.2   < > 38.0* 36.4* 33.0* 30.3*  MCV 97.2  --   --  94.5 94.6 96.8  PLT 236  --   --  239 210 176   < > = values in this interval not displayed.    Medications:     atorvastatin  40 mg Per Tube Daily   Chlorhexidine Gluconate Cloth  6 each Topical Q0600   cholecalciferol  2,000 Units Per Tube Daily   famotidine  10 mg Per Tube Daily   feeding supplement (PROSource TF20)  60 mL Per Tube Daily   LORazepam  2 mg Intravenous Once   multivitamin  1 tablet Per Tube QHS   mouth rinse  15 mL Mouth Rinse Q2H   polyethylene glycol  17 g Per Tube Daily   senna-docusate  1 tablet Per Tube BID      Anthony Sar, MD [facility] 03/05/2023, 12:49 PM

## 2023-03-05 NOTE — Progress Notes (Signed)
Re-Hook (after MRI) LTM EEG hooked up and running - no initial skin breakdown - push button tested - [facility] monitoring.

## 2023-03-05 NOTE — Progress Notes (Addendum)
Inpatient Rehabilitation Care Coordinator Discharge Note   Patient Details  Name: Nouh Jelen MRN: [id number] Date of Birth: 05-06-1951   Discharge location: Transferred to acute due to medical decline on day of discharge  Length of Stay: 9 days  Discharge activity level: Independent with device  Home/community participation: active  Patient response EP:PIRJJO Literacy - How often do you need to have someone help you when you read instructions, pamphlets, or other written material from your doctor or pharmacy?: Never  Patient response AC:ZYSAYT Isolation - How often do you feel lonely or isolated from those around you?: Never  Services provided included: MD, RD, PT, OT, SLP, RN, CM, Pharmacy, SW  Financial Services:  Field seismologist Utilized: Dealer  Choices offered to/list presented to: pt  Follow-up services arranged:  Home Health, Patient/Family has no preference for HH/DME agencies Home Health Agency: [facility]  PT OT RN SP   Has all DME from previous admissions      Patient response to transportation need: Is the patient able to respond to transportation needs?: Yes In the past 12 months, has lack of transportation kept you from medical appointments or from getting medications?: No In the past 12 months, has lack of transportation kept you from meetings, work, or from getting things needed for daily living?: No   Patient/Family verbalized understanding of follow-up arrangements:  Yes  Individual responsible for coordination of the follow-up plan: Self and Lavone-sister [phone]  Confirmed correct DME delivered: Lucy Chris 03/05/2023    Comments (or additional information):Pt did well and reached his goals sooner and was ready to go home. Will have family or girlfriend drive to OP-HD since pt can not drive now.   Summary of Stay    Date/Time Discharge Planning CSW  02/26/23 [phone] Home with multiple family members checking  on him-sister checks three times daily and girlfreind and children. Awaiting team's recommendations RGD       Oshay Stranahan, Lemar Livings

## 2023-03-05 NOTE — Progress Notes (Signed)
RN brought back bag of versed to pharmacy.  Was handed to pharmacist tech, Gloris Manchester, at the elevator.

## 2023-03-05 NOTE — Progress Notes (Addendum)
Dr. Pearlean Brownie request unhook for MRI and rehook after MRI.    LTM EEG discontinued - no skin breakdown at [facility].

## 2023-03-05 NOTE — Progress Notes (Signed)
Patient transported on ventilator from 4N19 to MRI and back with no complications noted.

## 2023-03-05 NOTE — Progress Notes (Signed)
Pt back from MRI. Both numbers for EEG tried to let EEG tech know. No answer at this time. MD aware.

## 2023-03-05 NOTE — Progress Notes (Signed)
STROKE TEAM PROGRESS NOTE   BRIEF HPI [name] is a 72 y.o. male with history of new onset seizures recently started on dual dose Keppra, atrial fibrillation Eliquis, ESRD on HD presenting with left parietal lobe hemorrhage on CT, which was reversed with Andexxa.  Admitted 02/18/2023 for Right corona radiata infarcts, discharged 7/5 to CIR.    SIGNIFICANT HOSPITAL EVENTS 7/14: Possible focal status seen on EEG, VPA added, Vimpat 200mg  BID added 7/15 overnight: intubated to protect ariway. Loaded with phenobarb, stated on propofol.  Per chart notes, at least 2 attempts were made to contact patient's sister overnight with no answer.  (Will attempt throughout day today to speak with family) 7/15 AM: Status Epilepticus seen on EEG, Versed gtt added.   INTERIM HISTORY/SUBJECTIVE Patient remains on long-term EEG monitoring which still shows a lot of epileptogenicity and irritability in the left brain.  Long discussion yesterday with patient's sister with palliative care team and critical care team about patient's super refractory status epilepticus despite multiple medications and drips.  Plan is to wean Versed and ketamine drips over the next 24 to 48 hours.  Continue maintenance antiepileptics.  Family is agreed to family meeting tomorrow 4 PM with patient's son and daughter and sister.  On exam, patient remains intubated and heavily sedated on Versed  and  ketamine   He is also on small amount of Levophed. CCM and RN at bedside. Bedside EEG montotr yet showing   evidence of epileptogenicity arising from left hemisphere, maximal left parietal region and increased risk of seizure recurrence  Neurological exam is limited due to sedation but no witnessed obvious clinical seizure activity    CBC    Component Value Date/Time   WBC 15.6 (H) 03/05/2023 0554   RBC 3.13 (L) 03/05/2023 0554   HGB 10.0 (L) 03/05/2023 0554   HCT 30.3 (L) 03/05/2023 0554   PLT 176 03/05/2023 0554   MCV 96.8  03/05/2023 0554   MCH 31.9 03/05/2023 0554   MCHC 33.0 03/05/2023 0554   RDW 14.9 03/05/2023 0554   LYMPHSABS 1.5 03/02/2023 1839   MONOABS 0.8 03/02/2023 1839   EOSABS 0.1 03/02/2023 1839   BASOSABS 0.1 03/02/2023 1839    BMET    Component Value Date/Time   NA 134 (L) 03/05/2023 0554   K 4.7 03/05/2023 0554   CL 99 03/05/2023 0554   CO2 21 (L) 03/05/2023 0554   GLUCOSE 130 (H) 03/05/2023 0554   BUN 49 (H) 03/05/2023 0554   CREATININE 8.96 (H) 03/05/2023 0554   CALCIUM 7.9 (L) 03/05/2023 0554   GFRNONAA 6 (L) 03/05/2023 0554    IMAGING past 24 hours No results found.  Vitals:   03/05/23 1200 03/05/23 1215 03/05/23 1230 03/05/23 1245  BP: 110/65 133/75 128/75 (!) 122/54  Pulse: 84 85 87 85  Resp: 18 17 20 16   Temp: 99.3 F (37.4 C) 99.3 F (37.4 C) 99.3 F (37.4 C) 99.5 F (37.5 C)  TempSrc:      SpO2: 96% 99% 99% 99%  Weight:      Height:         PHYSICAL EXAM General:  Alert, well-nourished, well-developed elderly African-American male who is intubated sedated in no acute distress Psych:  Mood and affect appropriate for situation CV: Regular rate and rhythm on monitor Respiratory:  Regular, unlabored respirations on room air GI: Abdomen soft and nontender   NEURO:  Mental Status: Patient is sedated and intubated.  Eyes are closed.  Comatose.  Does not  respond to follow commands.  Cranial Nerves:  II: Pupils bilateral sluggishly reactive.  Fundi not visualized.  Corneal reflexes are absent III, IV, VI: Doll's eye movement and absent bilaterally. V: Sensation cannot be tested due to sedation VII: Face is symmetrical resting  VIII: hearing cannot be tested. IX, X: Palate not tested XII: tongue is midline without fasciculations. Motor: No significant motor response to noxious stimuli Tone: is normal and bulk is normal Sensation-no response to noxious stimuli Coordination: Not tested  gait- deferred   ASSESSMENT/PLAN  Intraparenchymal Hemorrhage:   left parietal with electrographic focal seizure status multidrug resistant refractory Etiology:  possible hemorrhagic transformation due to eliquis for Afib  Code Stroke CT head  Acute IPH left parietal lobe, 2.2 x 1.4 x 2.0 cm.   Surrounding vasogenic edema, no midline shift. Remote encephalomalacia of right parietal lobe Multiple remote lacunar infarcts within the basal ganglia/thalami Repeat CT Head: Stable IPH within left parietal lobe.  No interval hemorrhage.  MRI: pending, will attempt when stable.   2D Echo 7/3: EF 65-70%, left atrium is moderately dilated   LTM EEG Read 7/15 am:  Electrographic status epilepticus, left hemisphere, maximal left parietal region Burst suppression with epileptiform bursts, left hemisphere, maximal left parietal region LTM 7/16:Burst suppression with epileptiform bursts, left hemisphere, maximal left parietal region   This study showed evidence of epileptogenicity arising from left hemisphere, maximal left parietal region. This EEG pattern is on the ictal-interictal continuum with increased risk of seizure recurrence. Additionally there is profound diffuse encephalopathy, likely related to sedation.   LDL 68 HgbA1c 5.3  VTE prophylaxis - SCDs Eliquis (apixaban) daily prior to admission, now on No antithrombotic  due to IPH.   Therapy recommendations:  pending Disposition:  ICU  Hx of Stroke/TIA 02/18/2023 Remote infarcts in the right parietal lobe, basal ganglia and right frontal white matter.  Placed on Eliquis, was on this at home.  Discharged to CIR 7/5.  Seizure Disorder Status Epilepticus Keppra 500mg  BID at home, unknown compliance Started 08/03/2022 Add'l renal dose of 250mg  post-HD This admission 7/14: Possible focal status seen on EEG, VPA added, Vimpat 200mg  BID added 7/15 overnight: intubated to protect ariway. Loaded with phenobarb, stated on propofol. 7/15 AM: Status Epilepticus seen on EEG, Versed gtt added.  Seizure  Precautions Continue all AEDs, burst suppression Will review LTM read in a.m. and adjust/wean AEDs from there  Acute Respiratory Failure Intubated overnight for airway protection Vent management per CCM, appreciate their assistance  Atrial fibrillation Hypertension Home Meds: eliquis , Cardizem, Amiodarone Continue telemetry monitoring Continue holding anticoagulation due to IPH.  Blood Pressure Goal: SBP less than 160   Hyperlipidemia Home meds:  Lipitor 40mg , on hold LDL 68, goal < 70 High intensity statin not indicated due to ICH Consider starting statin at discharge  Dysphagia Patient has post-stroke dysphagia, SLP consulted    Diet   Diet NPO time specified Except for: Sips with Meds   Advance diet as tolerated  Other Active Problems ESRD with TTHS HD Dialysis orders, per nephrology  Hospital day # 3    Patient remains in refractory partial seizure status epilepticus despite being on keppra, Vimpat, propofol, versed and ketamine. Prognosis is poor for completely stopping electrographic's seizures I discussed yesterday with patient's sister about lack of response to verbal therapy since stopping his status and we may need to start weaning Versed and ketamine drip over the next 1 to 2 days and see how the patient does.. Pentobarb coma would entail  prolonged hemodynamic and ventilatory support with unlikely great recovery.  Case discussed with Dr. Melynda Ripple epileptologist as well as Dr. Denese Killings critical care medicine. Plan to remove his current long-term EEG MRI incompatible leads and checking MRI and replacing with MRI compatible leads later.  Discussed with Dr. Denese Killings and Dr. Melynda Ripple  This patient is critically ill and at significant risk of neurological worsening, death and care requires constant monitoring of vital signs, hemodynamics,respiratory and cardiac monitoring, extensive review of multiple databases, frequent neurological assessment, discussion with family, other  specialists and medical decision making of high complexity.I have made any additions or clarifications directly to the above note.This critical care time does not reflect procedure time, or teaching time or supervisory time of PA/NP/Med Resident etc but could involve care discussion time.  I spent 30 minutes of neurocritical care time  in the care of  this patient.      Delia Heady, MD Medical Director [facility] Pager: [phone] 03/05/2023 1:48 PM   To contact Stroke Continuity provider, please refer to [email or web address]. After hours, contact General Neurology

## 2023-03-06 LAB — BASIC METABOLIC PANEL
Anion gap: 17 — ABNORMAL HIGH (ref 5–15)
BUN: 75 mg/dL — ABNORMAL HIGH (ref 8–23)
CO2: 20 mmol/L — ABNORMAL LOW (ref 22–32)
Calcium: 8.5 mg/dL — ABNORMAL LOW (ref 8.9–10.3)
Chloride: 95 mmol/L — ABNORMAL LOW (ref 98–111)
Creatinine, Ser: 10.82 mg/dL — ABNORMAL HIGH (ref 0.61–1.24)
GFR, Estimated: 5 mL/min — ABNORMAL LOW (ref >60)
Glucose, Bld: 119 mg/dL — ABNORMAL HIGH (ref 70–99)
Potassium: 5.3 mmol/L — ABNORMAL HIGH (ref 3.5–5.1)
Sodium: 132 mmol/L — ABNORMAL LOW (ref 135–145)

## 2023-03-06 LAB — POCT I-STAT EG7
Acid-Base Excess: 2 mmol/L (ref 0.0–2.0)
Bicarbonate: 25.2 mmol/L (ref 20.0–28.0)
Calcium, Ion: 1.1 mmol/L — ABNORMAL LOW (ref 1.15–1.40)
HCT: 36 % — ABNORMAL LOW (ref 39.0–52.0)
Hemoglobin: 12.2 g/dL — ABNORMAL LOW (ref 13.0–17.0)
O2 Saturation: 91 %
Patient temperature: 37.5
Potassium: 3.1 mmol/L — ABNORMAL LOW (ref 3.5–5.1)
Sodium: 134 mmol/L — ABNORMAL LOW (ref 135–145)
TCO2: 26 mmol/L (ref 22–32)
pCO2, Ven: 33.9 mmHg — ABNORMAL LOW (ref 44–60)
pH, Ven: 7.481 — ABNORMAL HIGH (ref 7.25–7.43)
pO2, Ven: 58 mmHg — ABNORMAL HIGH (ref 32–45)

## 2023-03-06 LAB — CBC
HCT: 31.2 % — ABNORMAL LOW (ref 39.0–52.0)
Hemoglobin: 10.2 g/dL — ABNORMAL LOW (ref 13.0–17.0)
MCH: 30.6 pg (ref 26.0–34.0)
MCHC: 32.7 g/dL (ref 30.0–36.0)
MCV: 93.7 fL (ref 80.0–100.0)
Platelets: 180 10*3/uL (ref 150–400)
RBC: 3.33 MIL/uL — ABNORMAL LOW (ref 4.22–5.81)
RDW: 15 % (ref 11.5–15.5)
WBC: 9.6 10*3/uL (ref 4.0–10.5)
nRBC: 0 % (ref 0.0–0.2)

## 2023-03-06 LAB — MAGNESIUM: Magnesium: 2.3 mg/dL (ref 1.7–2.4)

## 2023-03-06 LAB — GLUCOSE, CAPILLARY
Glucose-Capillary: 87 mg/dL (ref 70–99)
Glucose-Capillary: 122 mg/dL — ABNORMAL HIGH (ref 70–99)
Glucose-Capillary: 88 mg/dL (ref 70–99)
Glucose-Capillary: 149 mg/dL — ABNORMAL HIGH (ref 70–99)
Glucose-Capillary: 87 mg/dL (ref 70–99)
Glucose-Capillary: 94 mg/dL (ref 70–99)

## 2023-03-06 MED ORDER — LACOSAMIDE 50 MG PO TABS
200.0000 mg | ORAL_TABLET | Freq: Two times a day (BID) | ORAL | Status: DC
  Administered 2023-03-06 – 2023-03-07 (×3): 200 mg
  Filled 2023-03-06 (×3): qty 4

## 2023-03-06 MED ORDER — VALPROIC ACID 250 MG/5ML PO SOLN
350.0000 mg | Freq: Three times a day (TID) | ORAL | Status: DC
  Administered 2023-03-06 – 2023-03-07 (×3): 350 mg
  Filled 2023-03-06 (×3): qty 10

## 2023-03-06 MED ORDER — LEVETIRACETAM 500 MG PO TABS
1000.0000 mg | ORAL_TABLET | Freq: Every day | ORAL | Status: DC
  Administered 2023-03-07: 1000 mg
  Filled 2023-03-06: qty 2

## 2023-03-06 MED ORDER — HEPARIN SODIUM (PORCINE) 5000 UNIT/ML IJ SOLN
5000.0000 [IU] | Freq: Three times a day (TID) | INTRAMUSCULAR | Status: DC
  Administered 2023-03-06 – 2023-03-07 (×3): 5000 [IU] via SUBCUTANEOUS
  Filled 2023-03-06 (×3): qty 1

## 2023-03-06 MED ORDER — LEVETIRACETAM 500 MG PO TABS
500.0000 mg | ORAL_TABLET | ORAL | Status: DC
  Administered 2023-03-06: 500 mg via ORAL
  Filled 2023-03-06: qty 1

## 2023-03-06 NOTE — Procedures (Addendum)
Patient Name: Tanner Rollins  MRN: [id number]  Epilepsy Attending: Charlsie Quest  Referring Physician/Provider: Elmer Picker, NP  Duration: 03/05/2023 2100 to 03/06/2023 2100   Patient history: 72 yo M with acute IPH and AMS. EEG to evaluate for seizure   Level of alertness:  comatose   AEDs during EEG study: LEV, LCM ,VPA, Versed, Ketamine   Technical aspects: This EEG study was done with scalp electrodes positioned according to the 10-20 International system of electrode placement. Electrical activity was reviewed with band pass filter of 1-70Hz , sensitivity of 7 uV/mm, display speed of 79mm/sec with a 60Hz  notched filter applied as appropriate. EEG data were recorded continuously and digitally stored.  Video monitoring was available and reviewed as appropriate.   Description: EEG showed burst suppression pattern with bursts of generalized 3-6hz  theta-delta slowing admixed with 12-13hz  beta activity lasting 3-7 seconds alternating with 2-3 seconds of generalized suppression.  Spikes were also noted during the bursts in left hemisphere, maximal left parietal region. hyperventilation and photic stimulation were not performed.     Event button was pressed on 03/06/2023 at 0747 for twitching on forehead (RN was examining the patient but unfortunately obstructing the camera so I was unable to see it).  Concomitant EEG before, during and after the event did not show any EEG change.    ABNORMALITY - Burst suppression, generalized - Spikes, left hemisphere, maximal left parietal region   IMPRESSION: This study showed evidence of epileptogenicity arising from left hemisphere, maximal left parietal region and increased risk of seizure recurrence. Additionally there is profound diffuse encephalopathy, likely related to sedation. No seizures were noted.  Event button was pressed on 03/06/2023 at 1747 for twitching on forehead without concomitant EEG change.  This was most likely not an epileptic  event.     [name]

## 2023-03-06 NOTE — Progress Notes (Addendum)
STROKE TEAM PROGRESS NOTE   BRIEF HPI [name] is a 72 y.o. male with history of new onset seizures recently started on dual dose Keppra, atrial fibrillation Eliquis, ESRD on HD presenting with left parietal lobe hemorrhage on CT, which was reversed with Andexxa.  Admitted 02/18/2023 for Right corona radiata infarcts, discharged 7/5 to CIR.    SIGNIFICANT HOSPITAL EVENTS 7/14: Possible focal status seen on EEG, VPA added, Vimpat 200mg  BID added 7/15 overnight: intubated to protect ariway. Loaded with phenobarb, stated on propofol.  Per chart notes, at least 2 attempts were made to contact patient's sister overnight with no answer.  (Will attempt throughout day today to speak with family) 7/15 AM: Status Epilepticus seen on EEG, Versed gtt added.  7/18: LTM EEG read negative this am. All AED/Sedation drips off.   INTERIM HISTORY/SUBJECTIVE RN at bedside.  No family at bedside. Patient is intubated.  No sedation or AED drips, as these were weaned off yesterday.  Maintenance AED continues.  LTM EEG continues, will continue for 24 hours.  No electrographic status noted anymore Neurological exam is limited as patient is still showing effects of sedation medication, sluggish corneal reflexes. No witnessed obvious clinical seizure activity. MRI scan of the brain shows stable appearance of the left parietal parenchymal hematoma with mild surrounding edema.  There are no changes to suggest chronic refractory seizures in the form of sulcal hyperintensities on DWI or FLAIR  CBC    Component Value Date/Time   WBC 9.6 03/06/2023 0429   RBC 3.33 (L) 03/06/2023 0429   HGB 12.2 (L) 03/06/2023 0806   HCT 36.0 (L) 03/06/2023 0806   PLT 180 03/06/2023 0429   MCV 93.7 03/06/2023 0429   MCH 30.6 03/06/2023 0429   MCHC 32.7 03/06/2023 0429   RDW 15.0 03/06/2023 0429   LYMPHSABS 1.5 03/08/2023 1839   MONOABS 0.8 02/26/2023 1839   EOSABS 0.1 03/08/2023 1839   BASOSABS 0.1 02/18/2023 1839     BMET    Component Value Date/Time   NA 134 (L) 03/06/2023 0806   K 3.1 (L) 03/06/2023 0806   CL 95 (L) 03/06/2023 0429   CO2 20 (L) 03/06/2023 0429   GLUCOSE 119 (H) 03/06/2023 0429   BUN 75 (H) 03/06/2023 0429   CREATININE 10.82 (H) 03/06/2023 0429   CALCIUM 8.5 (L) 03/06/2023 0429   GFRNONAA 5 (L) 03/06/2023 0429    IMAGING past 24 hours MR BRAIN WO CONTRAST  Result Date: 03/05/2023 CLINICAL DATA:  Left parietal lobe hemorrhage EXAM: MRI HEAD WITHOUT CONTRAST TECHNIQUE: Multiplanar, multiecho pulse sequences of the brain and surrounding structures were obtained without intravenous contrast. COMPARISON:  CT head 03/05/2023, brain MRI 02/18/2023 FINDINGS: Brain: Again seen is an intraparenchymal hematoma in the left parietal lobe measuring up to 2.0 cm x 2.0 cm x 1.4 cm, not significantly changed in size allowing for differences in modality. There is mild surrounding edema and mild regional mass effect with partial effacement of the left occipital horn but no midline shift, unchanged. There is a small focus of linear diffusion restriction extending inferomedially from the hematoma towards the left lateral ventricle consistent with a small amount of adjacent ischemia. There is no other evidence of acute intracranial hemorrhage, extra-axial fluid collection, or acute infarct. There is faint diffusion restriction in the right corona radiata consistent with evolving subacute infarct which was acute on the MRI from 02/18/2023. There is no hemorrhage or mass effect associated with this infarct. Background parenchymal volume loss with prominence of  the ventricular system and extra-axial CSF spaces is unchanged. The ventricles are stable in size. Encephalomalacia with associated chronic blood products in the right parietal lobe is unchanged. Chronic blood products in the right lentiform nucleus are unchanged. Small remote infarcts in the basal ganglia, thalami, hemispheric white matter, brainstem, and  cerebellar hemispheres are unchanged. Extensive additional punctate chronic microhemorrhages are seen throughout the supratentorial brain, infratentorial brain, and brainstem. Many of the microhemorrhages are centrally located while others are peripheral. The distribution is unchanged. The pituitary and suprasellar region are normal. There is no evidence of solid mass lesion. There is no midline shift. Vascular: Normal flow voids. Skull and upper cervical spine: Normal marrow signal. Sinuses/Orbits: There is layering fluid in the nasal cavity and maxillary sinuses which may be related to instrumentation. The nasogastric tube is partially imaged. The globes and orbits are unremarkable. Other: There is trace fluid in the mastoid air cells. IMPRESSION: 1. Similar size of the left parietal intraparenchymal hematoma with mild surrounding edema and mass effect but no midline shift. 2. Small amount of ischemia in the white matter adjacent to the hematoma. Given the extensive chronic microhemorrhages throughout the brain, the hematoma is favored to reflect sequela of hypertension or cerebral amyloid angiopathy, as opposed to hemorrhagic conversion of an ischemic infarct. 3. Evolving subacute infarct in the right corona radiata which was acute on the MRI from 02/18/2023. 4. Unchanged chronic blood products and encephalomalacia in the right parietal lobe and lentiform nucleus, and multiple small remote infarcts as above. Electronically Signed   By: Lesia Hausen M.D.   On: 03/05/2023 18:10    Vitals:   03/06/23 1116 03/06/23 1200 03/06/23 1300 03/06/23 1400  BP:  95/70 113/68 115/71  Pulse: 99     Resp: (!) 23 (!) 26 (!) 25 (!) 23  Temp:  (!) 100.6 F (38.1 C) (!) 100.8 F (38.2 C) (!) 100.4 F (38 C)  TempSrc:      SpO2:      Weight:      Height:         PHYSICAL EXAM General:  Alert, well-nourished, well-developed elderly African-American male who is intubated in no acute distress Psych:  Mood and affect  appropriate for situation CV: Regular rate and rhythm on monitor Respiratory:  Regular, unlabored respirations on room air GI: Abdomen soft and nontender   NEURO:  Mental Status: Patient is intubated.  Eyes are closed. Does not respond to follow commands.  Cranial Nerves:  II: Pupils bilateral sluggishly reactive.  Fundi not visualized.  Corneal reflexes are sluggish III, IV, VI: Doll's eye movement and absent bilaterally. V: Sensation cannot be tested  VII: Face is symmetrical resting  VIII: hearing cannot be tested. IX, X: Palate not tested XII: tongue is midline  Motor: No significant motor response to noxious stimuli Tone: is normal and bulk is normal Sensation-no response to noxious stimuli Coordination: Not tested  gait- deferred   ASSESSMENT/PLAN  Intraparenchymal Hemorrhage:  left parietal with electrographic focal seizure status multidrug resistant refractory Etiology:  possible hemorrhagic transformation due to eliquis for Afib  Code Stroke CT head  Acute IPH left parietal lobe, 2.2 x 1.4 x 2.0 cm.   Surrounding vasogenic edema, no midline shift. Remote encephalomalacia of right parietal lobe Multiple remote lacunar infarcts within the basal ganglia/thalami Repeat CT Head: Stable IPH within left parietal lobe.  No interval hemorrhage.  MRI: Similar size of left parietal IPH with mild surrounding edema and mass effect but no midline shift.  Extensive chronic microhemorrhages throughout the brain.  Evolving subacute infarct in the right corona radiata which was acute on MRI on 7/2.  Unchanged chronic blood products and encephalomalacia in the right parietal lobe and lentiform nucleus  2D Echo 7/3: EF 65-70%, left atrium is moderately dilated   LTM EEG Read 7/15 am:  Electrographic status epilepticus, left hemisphere, maximal left parietal region Burst suppression with epileptiform bursts, left hemisphere, maximal left parietal region LTM 7/16:Burst suppression with  epileptiform bursts, left hemisphere, maximal left parietal region   This study showed evidence of epileptogenicity arising from left hemisphere, maximal left parietal region. This EEG pattern is on the ictal-interictal continuum with increased risk of seizure recurrence. Additionally there is profound diffuse encephalopathy, likely related to sedation.   LDL 68 HgbA1c 5.3  VTE prophylaxis - SCDs Eliquis (apixaban) daily prior to admission, now on No antithrombotic  due to IPH.   Therapy recommendations:  pending Disposition:  ICU  Hx of Stroke/TIA 02/18/2023 Remote infarcts in the right parietal lobe, basal ganglia and right frontal white matter.  Placed on Eliquis, was on this at home.  Discharged to CIR 7/5.  Seizure Disorder Status Epilepticus Keppra 500mg  BID at home, unknown compliance Started 08/03/2022 Add'l renal dose of 250mg  post-HD This admission 7/14: Possible focal status seen on EEG, VPA added, Vimpat 200mg  BID added 7/15 overnight: intubated to protect ariway. Loaded with phenobarb, stated on propofol. 7/15 AM: Status Epilepticus seen on EEG, Versed gtt added.  Seizure Precautions Continue maintenance AEDs Continue LTM for at least 24hours post-sedation/AED drips off   Acute Respiratory Failure Intubated overnight for airway protection Vent management per CCM, appreciate their assistance  Atrial fibrillation Hypertension Home Meds: eliquis , Cardizem, Amiodarone Continue telemetry monitoring Continue holding anticoagulation due to IPH.  Blood Pressure Goal: SBP less than 160   Hyperlipidemia Home meds:  Lipitor 40mg , on hold LDL 68, goal < 70 High intensity statin not indicated due to ICH Consider starting statin at discharge  Dysphagia Patient has post-stroke dysphagia, SLP consulted    Diet   Diet NPO time specified Except for: Sips with Meds    Other Active Problems ESRD with TTHS HD Dialysis orders, per nephrology  Hospital day # 4   Pt  seen by Neuro NP/APP and later by MD. Note/plan to be edited by MD as needed.    Lynnae January, DNP, AGACNP-BC [facility] Please use AMION for contact information & EPIC for messaging.  I have personally obtained history,examined this patient, reviewed notes, independently viewed imaging studies, participated in medical decision making and plan of care.ROS completed by me personally and pertinent positives fully documented  I have made any additions or clarifications directly to the above note. Agree with note above.  Patient has been weaned off Versed and ketamine drips and long-term EEG monitoring no longer shows status but does show left hemispheric irritability.  MRI yesterday showed no unexpected findings or any changes from chronic refractory seizures.  No family available at the bedside.  Continue Keppra, Vimpat and Depakote for seizure.  Prognosis remains poor.  Discussed with Dr. Denese Killings critical care medicine This patient is critically ill and at significant risk of neurological worsening, death and care requires constant monitoring of vital signs, hemodynamics,respiratory and cardiac monitoring, extensive review of multiple databases, frequent neurological assessment, discussion with family, other specialists and medical decision making of high complexity.I have made any additions or clarifications directly to the above note.This critical care time does not reflect procedure time,  or teaching time or supervisory time of PA/NP/Med Resident etc but could involve care discussion time.  I spent 30 minutes of neurocritical care time  in the care of  this patient.      Delia Heady, MD Medical Director [facility] Pager: [phone] 03/06/2023 3:21 PM   To contact Stroke Continuity provider, please refer to [email or web address]. After hours, contact General Neurology

## 2023-03-06 NOTE — Progress Notes (Signed)
NAME:  Tanner Rollins, MRN:  [id number], DOB:  [birth year], LOS: 4 ADMISSION DATE:  02/19/2023, CONSULTATION DATE:  03/12/2023 REFERRING MD:  Dr Wilford Corner MD, CHIEF COMPLAINT:  Seizures, Encephalopathy    History of Present Illness:  72 year old man with hypertension and end-stage renal disease on HD, seizure disorder, atrial fibrillation on Eliquis with medical noncompliance. He was admitted 7/2-7/5 with right corona radiata CVAs. He completed rehab and then was discharged home on 9/14 on Keppra, Eliquis. After leaving the hospital he was noted to slump over in the car and was brought back for emergent evaluation. Head CT showed an acute parenchymal hemorrhage and left parietal lobe 2.2 x 1.4 x 2.0 cm with some surrounding vasogenic edema. He was admitted to 4N., course complicated by left-sided seizure activity. He was loaded and continued on valproate, continued Keppra. Subsequently continuous EEG continues to show seizures and he received Vimpat. There was some slowing of epileptogenic discharges but seizures continued. Some associated encephalopathy, suppressed mental status, hypotension and bradycardia. PCCM consulted for airway protection so he can undergo burst suppression.   Pertinent  Medical History  Hypertension/pAFIB/seizure disorder/ESRD on HD   Significant Hospital Events: Including procedures, antibiotic start and stop dates in addition to other pertinent events   7/14 Head CT > acute parenchymal hemorrhage left parietal lobe 2.2 x 1.4 x 2.0 cm with some surrounding vasogenic edema and effacement of the sulci.  No midline shift present.  Multiple remote lacunar infarcts noted in the basal ganglia and thalami 7/15 Intubated overnight for airway protection, remains in status this am despite propofol, keppra, vimpat, and valproate. Adding versed drip  7/16 on Ketamine and Versed infusion for goal Burst suppression over next 24 hours 7/17 wean sedation over next 24-48 hours, continues on Long  term EEG 7/18- sedation off completely- follow neuro exam  Interim History / Subjective:  Rass -5 on PRVC ventilation. Sedative IV gtts remain off. Received dialysis today  Objective   Blood pressure 98/77, pulse 99, temperature 100 F (37.8 C), resp. rate (!) 23, height 6' (1.829 m), weight 74.7 kg, SpO2 95%.    Vent Mode: PRVC FiO2 (%):  [40 %-60 %] 60 % Set Rate:  [15 bmp] 15 bmp Vt Set:  [580 mL] 580 mL PEEP:  [5 cmH20] 5 cmH20 Plateau Pressure:  [15 cmH20-17 cmH20] 17 cmH20   Intake/Output Summary (Last 24 hours) at 03/06/2023 1234 Last data filed at 03/06/2023 1000 Gross per 24 hour  Intake 1572.38 ml  Output 1000 ml  Net 572.38 ml   Filed Weights   03/05/23 0500 03/06/23 0500  Weight: 71.1 kg 74.7 kg    Examination: Physical Exam HENT:     Mouth/Throat:     Mouth: Mucous membranes are moist.  Eyes:     Pupils: Pupils are equal, round, and reactive to light.  Cardiovascular:     Rate and Rhythm: Normal rate and regular rhythm.     Pulses: Normal pulses.     Heart sounds: Normal heart sounds.  Pulmonary:     Comments: PRVC ventilation, lungs CTA BL Abdominal:     General: Bowel sounds are normal.     Palpations: Abdomen is soft.  Skin:    General: Skin is warm.     Capillary Refill: Capillary refill takes less than 2 seconds.  Neurological:     Comments: Rass -5, not following commands  Not withdrawing to noxious stimuli      Resolved Hospital Problem list    Leukocytosis- resolved Pt  notably had episode of leukocytosis 7/17 up to 15.6 which self resolved without intervention to WBC 9.6 on 7/18. Tmax 100.42F- CXR appears unchanged from prior, Likely secondary to rewarming hypothermic pt.  Hypothermia- resolved Developed hypothermia as low as 91.2F overnight 7/15 into 7/16. Unclear etiology at this time however likely secondary to sedation medications for burst suppression and mechanical intubation vs therapeutic hypothermia to assist in recovery of  patient with known ICH. Was placed on bair hugger therapy with good effect. Today 7/18 pt remains normothermic to low grade fevers.   Hypotension- resolved Pt hypotensive requiring levophed infusion. Hihgly suspected to be secondary to sedative medications. When sedation weaned, levophed gtt no longer needed.  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 Amyloid vasculopathy Right corona radiata CVA Multifactorial encephalopathy due to status epilepticus and ICH as well as sedating meds/AEDs Patient sedative medications have been successfully turned off with pt remaining RASS -5 on exam. Suspect slow to awaken due to ESRD. MRI overnight showing similar size of L parietal Intraparenchymal hematoma with mild surrounding edema associated with mass effect however no midline shift. Evolving subacute infarct in the right corona radiata which was acute on MRI 7/2. -Continue Keppra, Vimpat and Depakote AED -Continuous EEG without ongoing seizures  -Serial neuro exams -Aspiration and seizure precautions (HOB maintained at 30 degrees or greater, pulm hygiene, suctioning PRN)   Acute respiratory failure due to impaired airway protection  Pt was electively intubated for mechanical ventilation and airway protection 7/14. Remains intubated on PRVC. Goal to wean ventilator settings as able pending pt neuro and clinical improvement -Maintain full vent support with SAT/SBT as tolerated -titrate Vent setting to maintain SpO2 greater than or equal to 90%. -HOB elevated 30 degrees. -Plateau pressures less than 30 cm H20.  -Follow chest x-ray, ABG prn.   -Bronchial hygiene and RT/bronchodilator protocol.  Atrial fibrillation History of hypertension Pt takes Eliquis in the OP setting. Hold in setting of ICH and restart when clinically appropriate.  Pulse has ranged 80-110pm over last 24 hours -Tele  monitoring -Resume Eliquis anticoagulation when appropriate    ESRD on HD Patient received dialysis today 7/18. Briefly noted to have hyperkalemia however this was on BMP prior to dialysis. Notably had HD cath inserted 7/16 however dialysis nurses able to utilize fistula today, and noted HD cath malfunctioning. Ordered to remove HD cath as malfunctioning and no longer needed. -Nephrology following, appreciate reqs and dialysis schedule -Avoid nephrotoxic agents -Renally dose medications   Inadequate Oral Intake -Continue TF with Vital 1.5 cal @55ml /hr -Prosource feeding supplementation -Bowel regimen   GOC Family updated at bedside 7/17 and understand POC is for serial neurologic exams and SBT as able. Discussed that this could be prolonged especially in setting of ESRD on HD  Best Practice (right click and "Reselect all SmartList Selections" daily)   Diet/type: tubefeeds DVT prophylaxis: prophylactic heparin  GI prophylaxis: PPI Lines: No longer needed.  Order written to d/c  Foley:  Yes, and it is still needed Code Status:  full code Last date of multidisciplinary goals of care discussion [03/06/23 ]  Labs   CBC: Recent Labs  Lab 03/15/2023 1839 03/03/23 0054 03/03/23 0842 03/04/23 0506 03/05/23 0554 03/06/23 0429 03/06/23 0806  WBC 6.8  --  8.5 8.6 15.6* 9.6  --   NEUTROABS 4.4  --   --   --   --   --   --  HGB 13.5   < > 12.4* 10.9* 10.0* 10.2* 12.2*  HCT 41.2   < > 36.4* 33.0* 30.3* 31.2* 36.0*  MCV 97.2  --  94.5 94.6 96.8 93.7  --   PLT 236  --  239 210 176 180  --    < > = values in this interval not displayed.    Basic Metabolic Panel: Recent Labs  Lab 03/01/23 0900 02/25/2023 1839 03/03/23 0054 03/03/23 0842 03/03/23 1700 03/04/23 0506 03/04/23 1656 03/05/23 0554 03/06/23 0429 03/06/23 0806  NA 137 134*   < > 129*  --  134*  --  134* 132* 134*  K 4.4 4.4   < > 6.5*  --  4.4  --  4.7 5.3* 3.1*  CL 95* 92*  --  87*  --  93*  --  99 95*  --   CO2  24 23  --  22  --  23  --  21* 20*  --   GLUCOSE 150* 93  --  164*  --  101*  --  130* 119*  --   BUN 28* 28*  --  35*  --  30*  --  49* 75*  --   CREATININE 10.71* 8.60*  --  9.70*  --  7.54*  --  8.96* 10.82*  --   CALCIUM 9.8 10.1  --  9.0  --  8.3*  --  7.9* 8.5*  --   MG  --   --    < > 1.8 1.6* 1.6* 2.3 2.3 2.3  --   PHOS 4.0  --   --  4.6 2.5 5.7* 5.1*  --   --   --    < > = values in this interval not displayed.   GFR: Estimated Creatinine Clearance: 6.7 mL/min (A) (by C-G formula based on SCr of 10.82 mg/dL (H)). Recent Labs  Lab 03/03/23 0842 03/04/23 0506 03/05/23 0554 03/06/23 0429  PROCALCITON  --   --  3.00  --   WBC 8.5 8.6 15.6* 9.6    Liver Function Tests: Recent Labs  Lab 03/01/23 0900 03/12/2023 1839 03/04/23 0506  AST  --  14* 31  ALT  --  11 14  ALKPHOS  --  101 86  BILITOT  --  0.5 0.4  PROT  --  8.0 6.5  ALBUMIN 3.2* 3.9 2.9*   No results for input(s): "LIPASE", "AMYLASE" in the last 168 hours. No results for input(s): "AMMONIA" in the last 168 hours.  ABG    Component Value Date/Time   PHART 7.501 (H) 03/03/2023 0416   PCO2ART 34.4 03/03/2023 0416   PO2ART 93 03/03/2023 0416   HCO3 25.2 03/06/2023 0806   TCO2 26 03/06/2023 0806   ACIDBASEDEF 12.0 (H) 01/24/2022 0611   O2SAT 91 03/06/2023 0806     Coagulation Profile: Recent Labs  Lab 03/09/2023 1839  INR 1.4*    Cardiac Enzymes: No results for input(s): "CKTOTAL", "CKMB", "CKMBINDEX", "TROPONINI" in the last 168 hours.  HbA1C: Hgb A1c MFr Bld  Date/Time Value Ref Range Status  02/19/2023 04:34 AM 5.3 4.8 - 5.6 % Final    Comment:    (NOTE) Pre diabetes:          5.7%-6.4%  Diabetes:              >6.4%  Glycemic control for   <7.0% adults with diabetes   02/02/2022 09:46 PM 5.4 4.8 - 5.6 % Final    Comment:    (  NOTE) Pre diabetes:          5.7%-6.4%  Diabetes:              >6.4%  Glycemic control for   <7.0% adults with diabetes     CBG: Recent Labs  Lab  03/05/23 2117 03/05/23 2308 03/06/23 0422 03/06/23 0755 03/06/23 1149  GLUCAP 75 85 87 122* 88    Review of Systems:   Negative except as listed in HPI and POC.  Critical care time: 35 minutes    Janeece Riggers, AGACNP-BC [facility] For pager details, please see AMION or use EPIC chat After 1900, please call [facility] for cross coverage needs 03/06/2023 12:34 PM

## 2023-03-06 NOTE — Progress Notes (Signed)
vLTM maintenance  All iimpedances below 10kohms. No skin  breakdown noted at FP1  FP2   P7  F7

## 2023-03-06 NOTE — Progress Notes (Signed)
Increased to 60% due to desaturation.

## 2023-03-06 NOTE — Procedures (Signed)
HD Note:  Some information was entered later than the data was gathered due to patient care needs. The stated time with the data is accurate.  Patient being treated at bedside.  Received report from previous dialysis nurse.  Patient right femoral temp catheter was not functioning well enough to do dialysis.  Patient cannulated in left upper arm fistula with successful results. Placed a stop heparin sticker onto dressing of the temp catheter.  Found documentation that the next change date is 03/11/23.  Patient is not responsive. Issues with obtaining a pulse ox reading during the second half of treatment.  See patient nurse notes.  TX duration: 3.5 hours  Patient tolerated treatment well.   Total UF removed: 1000 ml  Hand-off given to patient's nurse.   Cathan Gearin L. Dareen Piano, RN [facility].

## 2023-03-06 NOTE — Progress Notes (Signed)
Palliative:  HPI: 72 y.o. 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 Ongoing escalation of antiepileptics requiring ventilator support. Also complications with clotted fistula.   I met today at Tanner Rollins bedside. No family present. Discussed with RN. I called and spoke with sister, Tanner Rollins. I explained that seizures seem overall controlled even off heavy sedation that was weaned off yesterday. He is tolerating dialysis at this time. He is still not awake or alert but he also needs more time for medication to get through his system especially in the setting of ESRD on dialysis. We had discussed tentative family meeting this afternoon but we discussed that there are no major changes or complications to discuss. Plans for time to allow medication wash out with hopes of improved alertness to consider extubation. Tanner Rollins agrees with pushing family meeting until we have more time to see how he does off heavy sedation.   I also called son, Tanner Rollins. I updated him to the same as I did his aunt as described above. He is pleased with progress so far. We agree to allow time for outcomes to see what Tanner Rollins will be able to do or not do. We will consider the need of family meeting in the coming days based on progress. I will update family again tomorrow.   All questions/concerns addressed. Emotional support provided.   Exam: Unresponsive. Not following commands. No distress. HR RRR. Tolerating vent via ETT 60% FiO2. Abd soft. No further warming blanket. Tolerating HD at this time.   Plan: - Time for outcomes.  - Consider family meeting in the coming days depending on progress.   50 min  Tanner Channel, NP Palliative Medicine Team Pager [phone] (Please see [email or web address] for  schedule) Team Phone [phone]    Greater than 50%  of this time was spent counseling and coordinating care related to the above assessment and plan

## 2023-03-06 NOTE — Progress Notes (Signed)
MD alerted that we were unable to pick up sp02 via skin sensor.  Was given orders to get VBG via Istat.  Lab was obtained and MD was shown in person.  No further orders were given.

## 2023-03-06 NOTE — Progress Notes (Signed)
[facility] Progress Note    Assessment/ Plan:   NEW ICH = IN ICU , RX per CCM team  //with recent admit (CIR dc today ) for Acute R CVA (2 areas):Noted was on On Eliquis  for ho AFib  ESRD:  Usual TTS schedule  No hd needs today -  NO HEPARIN W/ HD. S/p emergent HD 7/15 for hyperkalemia. Had issues with access previously but seems to be more functional now (likely transient dysfunction in the context of shock). Next HD Sat. Can likely remove femoral temp HD catheter Hyperkalemia: managing with HD Hypertension/volume: euvolemic on exam Anemia: Hgb 12.2. no ESA for now Metabolic bone disease: PO4 acceptable. Continue binders. Corrected Ca+ high. Hold VDRA.  Nutrition:  Alb low, Supplement po when ok  for pos  pA-Fib:  was On Eliquis + amiodarone(prior to ICH ). A/C on hold Status epilepticus: per neuro VDRF: intubated 7/15 for airway protection, per PCCM Shock: possible sedation effect, pressor support per PCCM. Hemodynamically stable currently.   Family meeting with palliative care on 7/18.  Outpatient Dialysis Orders: TTS at [facility] 4hr, 400/800, EDW 70kg, 2K/2Ca, AVF, 15g needles, - heparin 3000 units IV bolus as op ( FOR NO HEP HD NOW WITH  ICH 02/18/2023 admit) - no recent ESA, Hgb typically > 11 - no VDRA at the moment, prev on Hectoral - held as of 6/29 for Ca 10.2  Discussed with HD RN and CCM.  Anthony Sar, MD [facility]   Subjective:   Patient seen and examined on HD. No acute events. Off sedation, no response. Attempted to use fem temp line but did not function therefore switched to AVF which is fortunately running well with a BFR of 300. Off levo currently   Objective:   BP 102/66   Pulse (!) 109   Temp 99.5 F (37.5 C)   Resp (!) 32   Ht 6' (1.829 m)   Wt 74.7 kg   SpO2 95%   BMI 22.34 kg/m   Intake/Output Summary (Last 24 hours) at 03/06/2023 7829 Last data filed at 03/06/2023 0800 Gross per 24 hour  Intake 1784.81 ml  Output  0 ml  Net 1784.81 ml   Weight change: 3.6 kg  Physical Exam: Gen: ill appearing, intubated/sedated CVS: RRR Resp: CTA B/L, intubated Abd: soft Ext: no edema Neuro: unresponsive Dialysis access: LUE AVF in use, fem temp HD catheter  Imaging: MR BRAIN WO CONTRAST  Result Date: 03/05/2023 CLINICAL DATA:  Left parietal lobe hemorrhage EXAM: MRI HEAD WITHOUT CONTRAST TECHNIQUE: Multiplanar, multiecho pulse sequences of the brain and surrounding structures were obtained without intravenous contrast. COMPARISON:  CT head 02/25/2023, brain MRI 02/18/2023 FINDINGS: Brain: Again seen is an intraparenchymal hematoma in the left parietal lobe measuring up to 2.0 cm x 2.0 cm x 1.4 cm, not significantly changed in size allowing for differences in modality. There is mild surrounding edema and mild regional mass effect with partial effacement of the left occipital horn but no midline shift, unchanged. There is a small focus of linear diffusion restriction extending inferomedially from the hematoma towards the left lateral ventricle consistent with a small amount of adjacent ischemia. There is no other evidence of acute intracranial hemorrhage, extra-axial fluid collection, or acute infarct. There is faint diffusion restriction in the right corona radiata consistent with evolving subacute infarct which was acute on the MRI from 02/18/2023. There is no hemorrhage or mass effect associated with this infarct. Background parenchymal volume loss with prominence of the  ventricular system and extra-axial CSF spaces is unchanged. The ventricles are stable in size. Encephalomalacia with associated chronic blood products in the right parietal lobe is unchanged. Chronic blood products in the right lentiform nucleus are unchanged. Small remote infarcts in the basal ganglia, thalami, hemispheric white matter, brainstem, and cerebellar hemispheres are unchanged. Extensive additional punctate chronic microhemorrhages are seen  throughout the supratentorial brain, infratentorial brain, and brainstem. Many of the microhemorrhages are centrally located while others are peripheral. The distribution is unchanged. The pituitary and suprasellar region are normal. There is no evidence of solid mass lesion. There is no midline shift. Vascular: Normal flow voids. Skull and upper cervical spine: Normal marrow signal. Sinuses/Orbits: There is layering fluid in the nasal cavity and maxillary sinuses which may be related to instrumentation. The nasogastric tube is partially imaged. The globes and orbits are unremarkable. Other: There is trace fluid in the mastoid air cells. IMPRESSION: 1. Similar size of the left parietal intraparenchymal hematoma with mild surrounding edema and mass effect but no midline shift. 2. Small amount of ischemia in the white matter adjacent to the hematoma. Given the extensive chronic microhemorrhages throughout the brain, the hematoma is favored to reflect sequela of hypertension or cerebral amyloid angiopathy, as opposed to hemorrhagic conversion of an ischemic infarct. 3. Evolving subacute infarct in the right corona radiata which was acute on the MRI from 02/18/2023. 4. Unchanged chronic blood products and encephalomalacia in the right parietal lobe and lentiform nucleus, and multiple small remote infarcts as above. Electronically Signed   By: Lesia Hausen M.D.   On: 03/05/2023 18:10   DG Chest 1 View  Result Date: 03/05/2023 CLINICAL DATA:  Leukocytosis EXAM: CHEST  1 VIEW COMPARISON:  Previous studies including the examination of 03/03/2023 FINDINGS: Transverse diameter of heart is increased. Tip of endotracheal tube is 5.5 cm above the carina. Enteric tube is noted traversing the esophagus. Tip of left IJ central venous catheter is seen in superior vena cava. There are small patchy infiltrates in left lower lung field. Left hemidiaphragm is elevated. There is no pleural effusion or pneumothorax. IMPRESSION: Small  patchy infiltrate in left lower lung field may suggest atelectasis/pneumonia. Support devices as described above. Electronically Signed   By: Ernie Avena M.D.   On: 03/05/2023 16:53    Labs: BMET Recent Labs  Lab 03/01/23 0900 02/26/2023 1839 03/03/23 0054 03/03/23 0416 03/03/23 0842 03/03/23 1700 03/04/23 0506 03/04/23 1656 03/05/23 0554 03/06/23 0429 03/06/23 0806  NA 137 134* 130* 130* 129*  --  134*  --  134* 132* 134*  K 4.4 4.4 5.4* 5.6* 6.5*  --  4.4  --  4.7 5.3* 3.1*  CL 95* 92*  --   --  87*  --  93*  --  99 95*  --   CO2 24 23  --   --  22  --  23  --  21* 20*  --   GLUCOSE 150* 93  --   --  164*  --  101*  --  130* 119*  --   BUN 28* 28*  --   --  35*  --  30*  --  49* 75*  --   CREATININE 10.71* 8.60*  --   --  9.70*  --  7.54*  --  8.96* 10.82*  --   CALCIUM 9.8 10.1  --   --  9.0  --  8.3*  --  7.9* 8.5*  --   PHOS 4.0  --   --   --  4.6 2.5 5.7* 5.1*  --   --   --    CBC Recent Labs  Lab 02/21/2023 1839 03/03/23 0054 03/03/23 0842 03/04/23 0506 03/05/23 0554 03/06/23 0429 03/06/23 0806  WBC 6.8  --  8.5 8.6 15.6* 9.6  --   NEUTROABS 4.4  --   --   --   --   --   --   HGB 13.5   < > 12.4* 10.9* 10.0* 10.2* 12.2*  HCT 41.2   < > 36.4* 33.0* 30.3* 31.2* 36.0*  MCV 97.2  --  94.5 94.6 96.8 93.7  --   PLT 236  --  239 210 176 180  --    < > = values in this interval not displayed.    Medications:     amiodarone  200 mg Per Tube Daily   atorvastatin  40 mg Per Tube Daily   Chlorhexidine Gluconate Cloth  6 each Topical Q0600   cholecalciferol  2,000 Units Per Tube Daily   famotidine  10 mg Per Tube Daily   feeding supplement (PROSource TF20)  60 mL Per Tube Daily   LORazepam  2 mg Intravenous Once   multivitamin  1 tablet Per Tube QHS   mouth rinse  15 mL Mouth Rinse Q2H   polyethylene glycol  17 g Per Tube Daily   senna-docusate  1 tablet Per Tube BID      Anthony Sar, MD [facility] 03/06/2023, 9:03 AM

## 2023-03-06 NOTE — Progress Notes (Signed)
SLP Cancellation Note  Patient Details Name: Tanner Rollins MRN: [id number] DOB: 06-27-1951   Cancelled treatment:       Reason Eval/Treat Not Completed: Patient not medically ready. Remains sedated on vent.     Mahala Menghini., M.A. CCC-SLP Acute Rehabilitation Services Office [phone]  Secure chat preferred  03/06/2023, 8:08 AM

## 2023-03-07 ENCOUNTER — Inpatient Hospital Stay (HOSPITAL_COMMUNITY): Payer: No Typology Code available for payment source

## 2023-03-07 DIAGNOSIS — R001 Bradycardia, unspecified: Secondary | ICD-10-CM

## 2023-03-07 DIAGNOSIS — I610 Nontraumatic intracerebral hemorrhage in hemisphere, subcortical: Secondary | ICD-10-CM

## 2023-03-07 DIAGNOSIS — Z66 Do not resuscitate: Secondary | ICD-10-CM

## 2023-03-07 LAB — POCT I-STAT 7, (LYTES, BLD GAS, ICA,H+H)
Acid-Base Excess: 0 mmol/L (ref 0.0–2.0)
Bicarbonate: 25.8 mmol/L (ref 20.0–28.0)
Calcium, Ion: 1.16 mmol/L (ref 1.15–1.40)
HCT: 33 % — ABNORMAL LOW (ref 39.0–52.0)
Hemoglobin: 11.2 g/dL — ABNORMAL LOW (ref 13.0–17.0)
O2 Saturation: 92 %
Patient temperature: 38.3
Potassium: 6 mmol/L — ABNORMAL HIGH (ref 3.5–5.1)
Sodium: 129 mmol/L — ABNORMAL LOW (ref 135–145)
TCO2: 27 mmol/L (ref 22–32)
pCO2 arterial: 48.9 mmHg — ABNORMAL HIGH (ref 32–48)
pH, Arterial: 7.336 — ABNORMAL LOW (ref 7.35–7.45)
pO2, Arterial: 74 mmHg — ABNORMAL LOW (ref 83–108)

## 2023-03-07 LAB — MAGNESIUM: Magnesium: 2.5 mg/dL — ABNORMAL HIGH (ref 1.7–2.4)

## 2023-03-07 LAB — GLUCOSE, CAPILLARY
Glucose-Capillary: 117 mg/dL — ABNORMAL HIGH (ref 70–99)
Glucose-Capillary: 99 mg/dL (ref 70–99)
Glucose-Capillary: 102 mg/dL — ABNORMAL HIGH (ref 70–99)

## 2023-03-07 LAB — CBC
HCT: 32.6 % — ABNORMAL LOW (ref 39.0–52.0)
Hemoglobin: 10.7 g/dL — ABNORMAL LOW (ref 13.0–17.0)
MCH: 30.9 pg (ref 26.0–34.0)
MCHC: 32.8 g/dL (ref 30.0–36.0)
MCV: 94.2 fL (ref 80.0–100.0)
Platelets: 139 10*3/uL — ABNORMAL LOW (ref 150–400)
RBC: 3.46 MIL/uL — ABNORMAL LOW (ref 4.22–5.81)
RDW: 15.4 % (ref 11.5–15.5)
WBC: 4.2 10*3/uL (ref 4.0–10.5)
nRBC: 10.1 % — ABNORMAL HIGH (ref 0.0–0.2)

## 2023-03-07 LAB — BASIC METABOLIC PANEL
Anion gap: 23 — ABNORMAL HIGH (ref 5–15)
BUN: 70 mg/dL — ABNORMAL HIGH (ref 8–23)
CO2: 18 mmol/L — ABNORMAL LOW (ref 22–32)
Calcium: 9.2 mg/dL (ref 8.9–10.3)
Chloride: 90 mmol/L — ABNORMAL LOW (ref 98–111)
Creatinine, Ser: 7.52 mg/dL — ABNORMAL HIGH (ref 0.61–1.24)
GFR, Estimated: 7 mL/min — ABNORMAL LOW (ref >60)
Glucose, Bld: 112 mg/dL — ABNORMAL HIGH (ref 70–99)
Potassium: 7.2 mmol/L (ref 3.5–5.1)
Sodium: 131 mmol/L — ABNORMAL LOW (ref 135–145)

## 2023-03-07 MED ORDER — VANCOMYCIN HCL 750 MG/150ML IV SOLN: 750.0000 mg | INTRAVENOUS | Status: DC

## 2023-03-07 MED ORDER — VANCOMYCIN HCL 1750 MG/350ML IV SOLN
1750.0000 mg | Freq: Once | INTRAVENOUS | Status: DC
  Filled 2023-03-07 (×2): qty 350

## 2023-03-07 MED ORDER — PIPERACILLIN-TAZOBACTAM IN DEX 2-0.25 GM/50ML IV SOLN
2.2500 g | Freq: Three times a day (TID) | INTRAVENOUS | Status: DC
  Filled 2023-03-07 (×2): qty 50

## 2023-03-07 MED ORDER — PROPOFOL 1000 MG/100ML IV EMUL
INTRAVENOUS | Status: AC
  Filled 2023-03-07: qty 100

## 2023-03-07 MED ORDER — PROPOFOL 1000 MG/100ML IV EMUL
5.0000 ug/kg/min | INTRAVENOUS | Status: DC
  Administered 2023-03-07: 50 ug/kg/min via INTRAVENOUS
  Administered 2023-03-07: 30 ug/kg/min via INTRAVENOUS

## 2023-03-07 MED ORDER — LEVETIRACETAM 500 MG PO TABS: 500.0000 mg | ORAL_TABLET | ORAL | Status: DC

## 2023-03-07 MED ORDER — PIPERACILLIN-TAZOBACTAM 3.375 G IVPB 30 MIN
3.3750 g | Freq: Once | INTRAVENOUS | Status: AC
  Administered 2023-03-07: 3.375 g via INTRAVENOUS
  Filled 2023-03-07 (×2): qty 50

## 2023-03-20 NOTE — Progress Notes (Signed)
This chaplain responded to PMT NP-Alicia's page for spiritual care with the family after the Pt. death. The Pt. sister, aunt, children and grandchildren are at the bedside. The chaplain understands the people present are only a fraction of the family that love him and call him "Buster".    The family are sharing stories with each other and making decisions on the funeral home. The family accepted the chaplain's invitation for prayer and F/U spiritual care as needed.  Chaplain Stephanie Acre [phone]

## 2023-03-20 NOTE — Progress Notes (Addendum)
Palliative:  HPI: 72 y.o. 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 Ongoing escalation of antiepileptics requiring ventilator support. Also complications with clotted fistula.   I met today at Tanner Rollins bedside. He is having increasing ectopy and decreased saturations despite increased ventilator support. I called and spoke with son and then with sister, Tanner Rollins and Tanner Rollins. I explained that he is declining and appears to be approaching end of life. I discussed code status and DNR is elected. I encouraged family to come to bedside asap to say goodbye as he could die at any time despite our interventions.   Update: I met again with Tanner Rollins and daughter at bedside. We discussed comfort care and one way extubation. They are awaiting son to come to bedside and then we will consider one way extubation. They understand that he may die on ventilator. He continued to decline as I was at bedside and he was pronounced time of death 1241 asystole, no breath sounds, no heart beat. Sister and daughter at bedside. Emotional support provided.   TOD 1241  50 min  [name], NP Palliative Medicine Team Pager [phone] (Please see [email or web address] for schedule) Team Phone [phone]    Greater than 50%  of this time was spent counseling and coordinating care related to the above assessment and plan

## 2023-03-20 NOTE — Progress Notes (Signed)
SLP Cancellation Note  Patient Details Name: Reginal Wojcicki MRN: [id number] DOB: 09-25-1950   Cancelled treatment:       Reason Eval/Treat Not Completed: Patient not medically ready. Pt remains on vent. SLP will s/o for now but please reconsult if status changes.     Mahala Menghini., M.A. CCC-SLP Acute Rehabilitation Services Office [phone]  Secure chat preferred  03-23-2023, 7:30 AM

## 2023-03-20 NOTE — Progress Notes (Signed)
[facility] NEPHROLOGY PROGRESS NOTE  Assessment/ Plan:  # New ICH with recent admit (CIR dc today ) for Acute R CVA (2 areas):Noted was on On Eliquis  for ho Afib.   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   # Acute respiratory failure, ARDS PNA vs pulm edema: on mechanical ventillaton, sedation.  # Shock? Sepsis; on vanc, zosyn in ICU, may need pressors.  # ESRD: TTS, NO HEPARIN W/ HD. S/p HD yesterday with 1 L UF. Noted hyperkalemia. Primary team is having meeting with family for possible comfort measures. If family desire further intervention then he will need CRRT after placement of HD catheter. Prognosis looks grim. D/w Dr. Katrinka Blazing.   # Hyperkalemia: managing with dialysis.   #Anemia: Hgb ok, no need for ESA.   # Metabolic bone disease: PO4 acceptable. Continue binders when able to take orally.   Outpatient Dialysis Orders: TTS at [facility] 4hr, 400/800, EDW 70kg, 2K/2Ca, AVF, 15g needles, - heparin 3000 units IV bolus as op ( FOR NO HEP HD NOW WITH  ICH 03/06/2023 admit) - no recent ESA, Hgb typically > 11 - no VDRA at the moment, prev on Hectoral - held as of 6/29 for Ca 10.2   Discussed with HD RN and CCM.  Subjective:  Seen and examined. Intubated and sedated. O2 requirement has gone up. Family meeting today to discuss GOC. Objective Vital signs in last 24 hours: Vitals:   08-Mar-2023 1200 2023/03/08 1215 March 08, 2023 1225 08-Mar-2023 1230  BP: (!) 94/47 (!) 80/59 (!) 89/46 (!) 77/46  Pulse: 84 61    Resp: (!) 21 20 (!) 21 20  Temp: 98.6 F (37 C) 98.2 F (36.8 C) 98.1 F (36.7 C) 97.9 F (36.6 C)  TempSrc:      SpO2: (!) 76% (!) 79%    Weight:      Height:       Weight change:   Intake/Output Summary (Last 24 hours) at 03-08-2023 1336 Last data filed at 03/08/2023 1200 Gross per 24 hour  Intake 1649.35 ml  Output 0 ml  Net 1649.35 ml       Labs: RENAL PANEL Recent Labs  Lab 03/01/23 0900  03/15/2023 1839 03/03/23 0054 03/03/23 0842 03/03/23 1700 03/04/23 0506 03/04/23 1656 03/05/23 0554 03/06/23 0429 03/06/23 0806 03-08-23 0929  NA 137 134*   < > 129*  --  134*  --  134* 132* 134* 129*  K 4.4 4.4   < > 6.5*  --  4.4  --  4.7 5.3* 3.1* 6.0*  CL 95* 92*  --  87*  --  93*  --  99 95*  --   --   CO2 24 23  --  22  --  23  --  21* 20*  --   --   GLUCOSE 150* 93  --  164*  --  101*  --  130* 119*  --   --   BUN 28* 28*  --  35*  --  30*  --  49* 75*  --   --   CREATININE 10.71* 8.60*  --  9.70*  --  7.54*  --  8.96* 10.82*  --   --   CALCIUM 9.8 10.1  --  9.0  --  8.3*  --  7.9* 8.5*  --   --   MG  --   --    < > 1.8 1.6* 1.6* 2.3 2.3  2.3  --   --   PHOS 4.0  --   --  4.6 2.5 5.7* 5.1*  --   --   --   --   ALBUMIN 3.2* 3.9  --   --   --  2.9*  --   --   --   --   --    < > = values in this interval not displayed.    Liver Function Tests: Recent Labs  Lab 03/01/23 0900 03/01/2023 1839 03/04/23 0506  AST  --  14* 31  ALT  --  11 14  ALKPHOS  --  101 86  BILITOT  --  0.5 0.4  PROT  --  8.0 6.5  ALBUMIN 3.2* 3.9 2.9*   No results for input(s): "LIPASE", "AMYLASE" in the last 168 hours. No results for input(s): "AMMONIA" in the last 168 hours. CBC: Recent Labs    08/03/22 0347 02/18/23 0900 03/05/23 0554 03/06/23 0429 03/06/23 0806 2023/03/28 0929 2023/03/28 1154  HGB 12.2*   < > 10.0* 10.2* 12.2* 11.2* 10.7*  MCV 90.8   < > 96.8 93.7  --   --  94.2  VITAMINB12 1,016*  --   --   --   --   --   --   FOLATE 28.4  --   --   --   --   --   --   FERRITIN 1,024*  --   --   --   --   --   --   TIBC 223*  --   --   --   --   --   --   IRON 83  --   --   --   --   --   --   RETICCTPCT 0.9  --   --   --   --   --   --    < > = values in this interval not displayed.    Cardiac Enzymes: No results for input(s): "CKTOTAL", "CKMB", "CKMBINDEX", "TROPONINI" in the last 168 hours. CBG: Recent Labs  Lab 03/06/23 1930 03/06/23 2319 2023-03-28 0331 2023/03/28 0815  March 28, 2023 1148  GLUCAP 87 94 117* 99 102*    Iron Studies: No results for input(s): "IRON", "TIBC", "TRANSFERRIN", "FERRITIN" in the last 72 hours. Studies/Results: DG CHEST PORT 1 VIEW  Result Date: 03-28-2023 CLINICAL DATA:  Endotracheal tube placement. EXAM: PORTABLE CHEST 1 VIEW COMPARISON:  March 05, 2023. FINDINGS: The heart size and mediastinal contours are within normal limits. Endotracheal tube is in grossly good position. Feeding tube is seen entering stomach. Bilateral basilar opacities are noted, right greater than left, concerning for pneumonia. The visualized skeletal structures are unremarkable. IMPRESSION: Endotracheal tube in grossly good position. Bibasilar opacities as described above. Electronically Signed   By: Lupita Raider M.D.   On: 03-28-23 11:28   MR BRAIN WO CONTRAST  Result Date: 03/05/2023 CLINICAL DATA:  Left parietal lobe hemorrhage EXAM: MRI HEAD WITHOUT CONTRAST TECHNIQUE: Multiplanar, multiecho pulse sequences of the brain and surrounding structures were obtained without intravenous contrast. COMPARISON:  CT head 03/09/2023, brain MRI 02/18/2023 FINDINGS: Brain: Again seen is an intraparenchymal hematoma in the left parietal lobe measuring up to 2.0 cm x 2.0 cm x 1.4 cm, not significantly changed in size allowing for differences in modality. There is mild surrounding edema and mild regional mass effect with partial effacement of the left occipital horn but no midline shift, unchanged. There is a small focus of linear  diffusion restriction extending inferomedially from the hematoma towards the left lateral ventricle consistent with a small amount of adjacent ischemia. There is no other evidence of acute intracranial hemorrhage, extra-axial fluid collection, or acute infarct. There is faint diffusion restriction in the right corona radiata consistent with evolving subacute infarct which was acute on the MRI from 02/18/2023. There is no hemorrhage or mass effect  associated with this infarct. Background parenchymal volume loss with prominence of the ventricular system and extra-axial CSF spaces is unchanged. The ventricles are stable in size. Encephalomalacia with associated chronic blood products in the right parietal lobe is unchanged. Chronic blood products in the right lentiform nucleus are unchanged. Small remote infarcts in the basal ganglia, thalami, hemispheric white matter, brainstem, and cerebellar hemispheres are unchanged. Extensive additional punctate chronic microhemorrhages are seen throughout the supratentorial brain, infratentorial brain, and brainstem. Many of the microhemorrhages are centrally located while others are peripheral. The distribution is unchanged. The pituitary and suprasellar region are normal. There is no evidence of solid mass lesion. There is no midline shift. Vascular: Normal flow voids. Skull and upper cervical spine: Normal marrow signal. Sinuses/Orbits: There is layering fluid in the nasal cavity and maxillary sinuses which may be related to instrumentation. The nasogastric tube is partially imaged. The globes and orbits are unremarkable. Other: There is trace fluid in the mastoid air cells. IMPRESSION: 1. Similar size of the left parietal intraparenchymal hematoma with mild surrounding edema and mass effect but no midline shift. 2. Small amount of ischemia in the white matter adjacent to the hematoma. Given the extensive chronic microhemorrhages throughout the brain, the hematoma is favored to reflect sequela of hypertension or cerebral amyloid angiopathy, as opposed to hemorrhagic conversion of an ischemic infarct. 3. Evolving subacute infarct in the right corona radiata which was acute on the MRI from 02/18/2023. 4. Unchanged chronic blood products and encephalomalacia in the right parietal lobe and lentiform nucleus, and multiple small remote infarcts as above. Electronically Signed   By: Lesia Hausen M.D.   On: 03/05/2023 18:10     Medications: Infusions:  sodium chloride     feeding supplement (VITAL 1.5 CAL) 55 mL/hr at March 31, 2023 1200   piperacillin-tazobactam (ZOSYN)  IV     propofol (DIPRIVAN) infusion 40 mcg/kg/min (2023/03/31 1200)   vancomycin     [START ON 03/08/2023] vancomycin      Scheduled Medications:  amiodarone  200 mg Per Tube Daily   atorvastatin  40 mg Per Tube Daily   Chlorhexidine Gluconate Cloth  6 each Topical Q0600   cholecalciferol  2,000 Units Per Tube Daily   famotidine  10 mg Per Tube Daily   feeding supplement (PROSource TF20)  60 mL Per Tube Daily   heparin injection (subcutaneous)  5,000 Units Subcutaneous Q8H   lacosamide  200 mg Per Tube BID   levETIRAcetam  1,000 mg Per Tube Daily   [START ON 03/08/2023] levETIRAcetam  500 mg Per Tube Q T,Th,Sat-1800   multivitamin  1 tablet Per Tube QHS   mouth rinse  15 mL Mouth Rinse Q2H   polyethylene glycol  17 g Per Tube Daily   senna-docusate  1 tablet Per Tube BID   valproic acid  350 mg Per Tube Q8H    have reviewed scheduled and prn medications.  Physical Exam: General:critically ill looking male, intubated and sedated.  Heart:RRR, s1s2 nl Lungs:coarse breath sound b/; Abdomen:soft, non-distended Extremities:No edema Dialysis Access: LUE AVF   Kaylynn Chamblin Jaynie Collins 2023/03/31,1:36 PM  LOS: 5  days

## 2023-03-20 NOTE — Progress Notes (Signed)
Palliative:  I spoke with sister Sadie Haber and son Olena Leatherwood. Discussed significant decline and concern for approaching end of life. Encouraged them both to come to bedside as anything can happen at any time. They both agree that we should not escalate to CPR but continue to maintain ventilator support and medical support otherwise with hopes of stabilization. We will discuss further when family arrives to bedside. DNR but maintain ventilator support at this time. Discussed with Dr. Katrinka Blazing as well as RN/RT.   No charge  [name], NP Palliative Medicine Team Pager [phone] (Please see [email or web address] for schedule) Team Phone [phone]

## 2023-03-20 NOTE — Progress Notes (Signed)
vLTM discontinued  No skin breakdown noted at all skin sites  [facility] notified

## 2023-03-20 NOTE — Progress Notes (Addendum)
Pharmacy Antibiotic Note  Tanner Rollins is a 72 y.o. male admitted on 02/20/2023 with status epilepticus, now with pneumonia.  Pharmacy has been consulted for vancomycin dosing. Also started on Zosyn. ESRD on HD TTS. Noted off-schedule for sessions previously this admit with access issues/hyperkalemia but received full HD session 7/18.  Plan: Vancomycin 1750mg  IV x 1; then 750mg  IV qHD TTS (goal pre-HD level 15-25) Zosyn 3.375g IV x 1 ( infusion); then 2.25g IV q8h Monitor clinical progress, c/s, abx plan/LOT F/u HD schedule/tolerance inpatient to ensure vancomycin doses scheduled appropriately, pre-HD vancomycin level as indicated  Height: 6' (182.9 cm) Weight:  (See primary nurses note) IBW/kg (Calculated) : 77.6  Temp (24hrs), Avg:99.9 F (37.7 C), Min:98.1 F (36.7 C), Max:101.3 F (38.5 C)  Recent Labs  Lab 03/05/2023 1839 03/03/23 0842 03/04/23 0506 03/05/23 0554 03/06/23 0429  WBC 6.8 8.5 8.6 15.6* 9.6  CREATININE 8.60* 9.70* 7.54* 8.96* 10.82*    Estimated Creatinine Clearance: 6.7 mL/min (A) (by C-G formula based on SCr of 10.82 mg/dL (H)).    No Known Allergies   Leia Alf, PharmD, BCPS Please check AMION for all [facility] contact numbers Clinical Pharmacist March 20, 2023 10:46 AM

## 2023-03-20 NOTE — Progress Notes (Signed)
Patient made a DNR recently after discussion with provider. Family at bedside. Irving Burton, RN and Helmut Muster, NP could not auscultate a heartbeat. Patient pronounced 1241. Dr. Katrinka Blazing notified. [employer] notified.

## 2023-03-20 NOTE — Procedures (Signed)
Extubation Procedure Note  Patient Details:   Name: Tanner Rollins DOB: 1951/04/07 MRN: [id number]   Airway Documentation:  Airway 7.5 mm (Active)  Secured at (cm) 25 cm 04/04/2023 0800  Measured From Lips 04-04-23 0800  Secured Location Center Apr 04, 2023 0800  Secured By [employer] 04/04/2023 0800  Tube Holder Repositioned Yes 04-04-2023 0800  Prone position No 04-04-23 0800  Cuff Pressure (cm H2O) Clear OR 27-39 CmH2O 2023/04/04 0800  Site Condition Dry April 04, 2023 0800   Vent end date: (not recorded) Vent end time: (not recorded)   Evaluation  O2 sats: pt terminally extubated Complications: pt terminally extubated Patient Pt terminally extubated Bilateral Breath Sounds: Diminished, Clear   No Pt terminally extubated Steffany Schoenfelder V April 04, 2023, 1:11 PM

## 2023-03-20 NOTE — Progress Notes (Signed)
STROKE TEAM PROGRESS NOTE   BRIEF HPI Tanner Rollins is a 72 y.o. male with history of new onset seizures recently started on dual dose Keppra, atrial fibrillation Eliquis, ESRD on HD presenting with left parietal lobe hemorrhage on CT, which was reversed with Andexxa.  Admitted 02/18/2023 for Right corona radiata infarcts, discharged 7/5 to CIR.    SIGNIFICANT HOSPITAL EVENTS 7/14: Possible focal status seen on EEG, VPA added, Vimpat 200mg  BID added 7/15 overnight: intubated to protect ariway. Loaded with phenobarb, stated on propofol.  Per chart notes, at least 2 attempts were made to contact patient's sister overnight with no answer.  (Will attempt throughout day today to speak with family) 7/15 AM: Status Epilepticus seen on EEG, Versed gtt added.  7/18: LTM EEG read negative this am. All AED/Sedation drips off.   INTERIM HISTORY/SUBJECTIVE RN at bedside.  No family at bedside. Patient remains intubated. And on ventilator support for resp failure.  No sedation or AED drips, as these were weaned off yesterday.  Maintenance AED continues.  LTM EEG continues, will continue for 24 hours.  No electrographic status noted anymore.Continue sto have intermittent transient tachycardia episodes mostly with stimulation. Neurological exam is limited as patient is still showing effects of sedation medication, sluggish corneal reflexes. No witnessed obvious clinical seizure activity.    CBC    Component Value Date/Time   WBC 4.2 Apr 05, 2023 1154   RBC 3.46 (L) April 05, 2023 1154   HGB 10.7 (L) 04-05-2023 1154   HCT 32.6 (L) 2023/04/05 1154   PLT 139 (L) 2023/04/05 1154   MCV 94.2 04-05-2023 1154   MCH 30.9 Apr 05, 2023 1154   MCHC 32.8 2023-04-05 1154   RDW 15.4 04/05/23 1154   LYMPHSABS 1.5 03/03/2023 1839   MONOABS 0.8 03/01/2023 1839   EOSABS 0.1 02/23/2023 1839   BASOSABS 0.1 03/06/2023 1839    BMET    Component Value Date/Time   NA 131 (L) April 05, 2023 1154   K 7.2 (HH) 2023/04/05  1154   CL 90 (L) 05-Apr-2023 1154   CO2 18 (L) 04-05-2023 1154   GLUCOSE 112 (H) 04/05/23 1154   BUN 70 (H) 2023-04-05 1154   CREATININE 7.52 (H) 04-05-2023 1154   CALCIUM 9.2 04-05-2023 1154   GFRNONAA 7 (L) 04/05/23 1154    IMAGING past 24 hours DG CHEST PORT 1 VIEW  Result Date: 04/05/2023 CLINICAL DATA:  Endotracheal tube placement. EXAM: PORTABLE CHEST 1 VIEW COMPARISON:  March 05, 2023. FINDINGS: The heart size and mediastinal contours are within normal limits. Endotracheal tube is in grossly good position. Feeding tube is seen entering stomach. Bilateral basilar opacities are noted, right greater than left, concerning for pneumonia. The visualized skeletal structures are unremarkable. IMPRESSION: Endotracheal tube in grossly good position. Bibasilar opacities as described above. Electronically Signed   By: Lupita Raider M.D.   On: 2023/04/05 11:28    Vitals:   04-05-23 1215 2023/04/05 1225 05-Apr-2023 1230 04-05-2023 1300  BP: (!) 80/59 (!) 89/46 (!) 77/46   Pulse: 61     Resp: 20 (!) 21 20   Temp: 98.2 F (36.8 C) 98.1 F (36.7 C) 97.9 F (36.6 C)   TempSrc:      SpO2: (!) 79%     Weight:    74.7 kg  Height:    6' (1.829 m)     PHYSICAL EXAM General:  Alert, well-nourished, well-developed elderly African-American male who is intubated in no acute distress Psych:  Mood and affect appropriate for situation CV: Regular  rate and rhythm on monitor Respiratory:  Regular, unlabored respirations on room air GI: Abdomen soft and nontender   NEURO:  Mental Status: Patient is intubated.  Eyes are closed. Does not respond to follow commands.  Cranial Nerves:  II: Pupils bilateral sluggishly reactive.  Fundi not visualized.  Corneal reflexes are sluggish III, IV, VI: Doll's eye movement and absent bilaterally. V: Sensation cannot be tested  VII: Face is symmetrical resting  VIII: hearing cannot be tested. IX, X: Palate not tested XII: tongue is midline  Motor: No significant  motor response to noxious stimuli Tone: is normal and bulk is normal Sensation-no response to noxious stimuli Coordination: Not tested  gait- deferred   ASSESSMENT/PLAN  Intraparenchymal Hemorrhage:  left parietal with electrographic focal seizure status multidrug resistant refractory Etiology:  possible hemorrhagic transformation due to eliquis for Afib  Code Stroke CT head  Acute IPH left parietal lobe, 2.2 x 1.4 x 2.0 cm.   Surrounding vasogenic edema, no midline shift. Remote encephalomalacia of right parietal lobe Multiple remote lacunar infarcts within the basal ganglia/thalami Repeat CT Head: Stable IPH within left parietal lobe.  No interval hemorrhage.  MRI: Similar size of left parietal IPH with mild surrounding edema and mass effect but no midline shift.  Extensive chronic microhemorrhages throughout the brain.  Evolving subacute infarct in the right corona radiata which was acute on MRI on 7/2.  Unchanged chronic blood products and encephalomalacia in the right parietal lobe and lentiform nucleus  2D Echo 7/3: EF 65-70%, left [facility]rium is moderately dilated   LTM EEG Read 7/15 am:  Electrographic status epilepticus, left hemisphere, maximal left parietal region Burst suppression with epileptiform bursts, left hemisphere, maximal left parietal region LTM 7/16:Burst suppression with epileptiform bursts, left hemisphere, maximal left parietal region   This study showed evidence of epileptogenicity arising from left hemisphere, maximal left parietal region. This EEG pattern is on the ictal-interictal continuum with increased risk of seizure recurrence. Additionally there is profound diffuse encephalopathy, likely related to sedation.   LDL 68 HgbA1c 5.3  VTE prophylaxis - SCDs Eliquis (apixaban) daily prior to admission, now on No antithrombotic  due to IPH.   Therapy recommendations:  pending Disposition:  ICU  Hx of Stroke/TIA 02/18/2023 Remote infarcts in the right  parietal lobe, basal ganglia and right frontal white matter.  Placed on Eliquis, was on this at home.  Discharged to CIR 7/5.  Seizure Disorder Status Epilepticus Keppra 500mg  BID at home, unknown compliance Started 08/03/2022 Add'l renal dose of 250mg  post-HD This admission 7/14: Possible focal status seen on EEG, VPA added, Vimpat 200mg  BID added 7/15 overnight: intubated to protect ariway. Loaded with phenobarb, stated on propofol. 7/15 AM: Status Epilepticus seen on EEG, Versed gtt added.  Seizure Precautions Continue maintenance AEDs Continue LTM for at least 24hours post-sedation/AED drips off   Acute Respiratory Failure Intubated overnight for airway protection Vent management per CCM, appreciate their assistance  Atrial fibrillation Hypertension Home Meds: eliquis , Cardizem, Amiodarone Continue telemetry monitoring Continue holding anticoagulation due to IPH.  Blood Pressure Goal: SBP less than 160   Hyperlipidemia Home meds:  Lipitor 40mg , on hold LDL 68, goal < 70 High intensity statin not indicated due to ICH Consider starting statin at discharge  Dysphagia Patient has post-stroke dysphagia, SLP consulted    Diet   Diet NPO time specified Except for: Sips with Meds    Other Active Problems ESRD with TTHS HD Dialysis orders, per nephrology  Hospital day # 5  Patient has been weaned off Versed and ketamine drips and long-term EEG monitoring no longer shows status but does show left hemispheric irritability.  No family available at the bedside.  Continue Keppra, Vimpat and Depakote for seizure.  Prognosis remains poor.  Discussed with Dr. Katrinka Blazing critical care medicine    This patient is critically ill and at significant risk of neurological worsening, death and care requires constant monitoring of vital signs, hemodynamics,respiratory and cardiac monitoring, extensive review of multiple databases, frequent neurological assessment, discussion with family,  other specialists and medical decision making of high complexity.I have made any additions or clarifications directly to the above note.This critical care time does not reflect procedure time, or teaching time or supervisory time of PA/NP/Med Resident etc but could involve care discussion time.  I spent 30 minutes of neurocritical care time  in the care of  this patient.       Delia Heady, MD Medical Director [facility] Pager: [phone] April 02, 2023 2:10 PM   To contact Stroke Continuity provider, please refer to [email or web address]. After hours, contact General Neurology

## 2023-03-20 NOTE — Procedures (Signed)
Patient Name: Tanner Rollins  MRN: [id number]  Epilepsy Attending: Charlsie Quest  Referring Physician/Provider: Elmer Picker, NP  Duration: 03/06/2023 2100 to 03-23-2023 0042   Patient history: 72 yo M with acute IPH and AMS. EEG to evaluate for seizure   Level of alertness:  comatose   AEDs during EEG study: LEV, LCM ,VPA   Technical aspects: This EEG study was done with scalp electrodes positioned according to the 10-20 International system of electrode placement. Electrical activity was reviewed with band pass filter of 1-70Hz , sensitivity of 7 uV/mm, display speed of 3mm/sec with a 60Hz  notched filter applied as appropriate. EEG data were recorded continuously and digitally stored.  Video monitoring was available and reviewed as appropriate.   Description: EEG showed near continuous eeg with generalized and lateralized left hemisphere, maximal left parietal 3-6hz  theta-delta slowing admixed with 12-13hz  beta activity lasting 3-7 seconds alternating with 2-3 seconds of generalized suppression.  Spikes were also noted during the bursts in left hemisphere, maximal left parietal region. hyperventilation and photic stimulation were not performed.      ABNORMALITY - Continuous slow, generalized and lateralized left hemisphere, maximal left parietal - Spikes, left hemisphere, maximal left parietal region   IMPRESSION: This study showed evidence of epileptogenicity and cortical dysfunction arising from left hemisphere, maximal left parietal region and increased risk of seizure recurrence. Additionally there is severe diffuse encephalopathy. No seizures were noted.    Gabrielle Wakeland Annabelle Harman

## 2023-03-20 NOTE — Death Summary Note (Signed)
DEATH SUMMARY   Patient Details  Name: Tanner Rollins MRN: [id number] DOB: [birth year]  Admission/Discharge Information   Admit Date:  03-16-2023  Date of Death: Date of Death: 21-Mar-2023  Time of Death: Time of Death: 04-05-40  Length of Stay: 5  Referring Physician: Pcp, No   Reason(s) for Hospitalization  Cause of death: nontraumatic acute left parietal intraparenchymal hemorrhage with brain compression  Recurrent aspiration pneumonia  Refractory status epilepticus  ESRD  Diagnoses  Preliminary cause of death:  Secondary Diagnoses (including complications and co-morbidities):  Principal Problem:   ICH (intracerebral hemorrhage) (HCC) Active Problems:   Seizure (HCC)   Protein-calorie malnutrition, severe   Brief Hospital Course (including significant findings, care, treatment, and services provided and events leading to death)  72 year old man with hypertension and end-stage renal disease on HD, seizure disorder, atrial fibrillation on Eliquis with medical noncompliance. He was admitted 7/2-7/5 with right corona radiata CVAs. He completed rehab and then was discharged home on 9/14 on Keppra, Eliquis. After leaving the hospital he was noted to slump over in the car and was brought back for emergent evaluation. Head CT showed an acute parenchymal hemorrhage and left parietal lobe 2.2 x 1.4 x 2.0 cm with some surrounding vasogenic edema. He was admitted to [facility] course complicated by left-sided seizure activity. He was loaded and continued on valproate, continued Keppra. Subsequently continuous EEG continues to show seizures and he received Vimpat. There was some slowing of epileptogenic discharges but seizures continued. Some associated encephalopathy, suppressed mental status, hypotension and bradycardia. PCCM consulted for airway protection so he can undergo burst suppression.   2023/03/16 Head CT > acute parenchymal hemorrhage left parietal lobe 2.2 x 1.4 x 2.0 cm with some surrounding  vasogenic edema and effacement of the sulci.  No midline shift present.  Multiple remote lacunar infarcts noted in the basal ganglia and thalami 7/15 Intubated overnight for airway protection, remains in status this am despite propofol, keppra, vimpat, and valproate. Adding versed drip  7/16 on Ketamine and Versed infusion for goal Burst suppression over next 24 hours 7/17 wean sedation over next 24-48 hours, continues on Long term EEG 7/18- sedation off completely- follow neuro exam 2023-03-21 worsening oxygenation with increasing vent setting   Persistent coma off sedation combined with inability to oxygenate led to urgent family meeting.  All in agreement for DNR status and patient passed shortly thereafter.  Pertinent Labs and Studies  Significant Diagnostic Studies DG CHEST PORT 1 VIEW  Result Date: 03/21/2023 CLINICAL DATA:  Endotracheal tube placement. EXAM: PORTABLE CHEST 1 VIEW COMPARISON:  March 05, 2023. FINDINGS: The heart size and mediastinal contours are within normal limits. Endotracheal tube is in grossly good position. Feeding tube is seen entering stomach. Bilateral basilar opacities are noted, right greater than left, concerning for pneumonia. The visualized skeletal structures are unremarkable. IMPRESSION: Endotracheal tube in grossly good position. Bibasilar opacities as described above. Electronically Signed   By: Lupita Raider M.D.   On: Mar 21, 2023 11:28   MR BRAIN WO CONTRAST  Result Date: 03/05/2023 CLINICAL DATA:  Left parietal lobe hemorrhage EXAM: MRI HEAD WITHOUT CONTRAST TECHNIQUE: Multiplanar, multiecho pulse sequences of the brain and surrounding structures were obtained without intravenous contrast. COMPARISON:  CT head Mar 16, 2023, brain MRI 02/18/2023 FINDINGS: Brain: Again seen is an intraparenchymal hematoma in the left parietal lobe measuring up to 2.0 cm x 2.0 cm x 1.4 cm, not significantly changed in size allowing for differences in modality. There is mild  surrounding edema and mild  regional mass effect with partial effacement of the left occipital horn but no midline shift, unchanged. There is a small focus of linear diffusion restriction extending inferomedially from the hematoma towards the left lateral ventricle consistent with a small amount of adjacent ischemia. There is no other evidence of acute intracranial hemorrhage, extra-axial fluid collection, or acute infarct. There is faint diffusion restriction in the right corona radiata consistent with evolving subacute infarct which was acute on the MRI from 02/18/2023. There is no hemorrhage or mass effect associated with this infarct. Background parenchymal volume loss with prominence of the ventricular system and extra-axial CSF spaces is unchanged. The ventricles are stable in size. Encephalomalacia with associated chronic blood products in the right parietal lobe is unchanged. Chronic blood products in the right lentiform nucleus are unchanged. Small remote infarcts in the basal ganglia, thalami, hemispheric white matter, brainstem, and cerebellar hemispheres are unchanged. Extensive additional punctate chronic microhemorrhages are seen throughout the supratentorial brain, infratentorial brain, and brainstem. Many of the microhemorrhages are centrally located while others are peripheral. The distribution is unchanged. The pituitary and suprasellar region are normal. There is no evidence of solid mass lesion. There is no midline shift. Vascular: Normal flow voids. Skull and upper cervical spine: Normal marrow signal. Sinuses/Orbits: There is layering fluid in the nasal cavity and maxillary sinuses which may be related to instrumentation. The nasogastric tube is partially imaged. The globes and orbits are unremarkable. Other: There is trace fluid in the mastoid air cells. IMPRESSION: 1. Similar size of the left parietal intraparenchymal hematoma with mild surrounding edema and mass effect but no midline shift.  2. Small amount of ischemia in the white matter adjacent to the hematoma. Given the extensive chronic microhemorrhages throughout the brain, the hematoma is favored to reflect sequela of hypertension or cerebral amyloid angiopathy, as opposed to hemorrhagic conversion of an ischemic infarct. 3. Evolving subacute infarct in the right corona radiata which was acute on the MRI from 02/18/2023. 4. Unchanged chronic blood products and encephalomalacia in the right parietal lobe and lentiform nucleus, and multiple small remote infarcts as above. Electronically Signed   By: Lesia Hausen M.D.   On: 03/05/2023 18:10   DG Chest 1 View  Result Date: 03/05/2023 CLINICAL DATA:  Leukocytosis EXAM: CHEST  1 VIEW COMPARISON:  Previous studies including the examination of 03/03/2023 FINDINGS: Transverse diameter of heart is increased. Tip of endotracheal tube is 5.5 cm above the carina. Enteric tube is noted traversing the esophagus. Tip of left IJ central venous catheter is seen in superior vena cava. There are small patchy infiltrates in left lower lung field. Left hemidiaphragm is elevated. There is no pleural effusion or pneumothorax. IMPRESSION: Small patchy infiltrate in left lower lung field may suggest atelectasis/pneumonia. Support devices as described above. Electronically Signed   By: Ernie Avena M.D.   On: 03/05/2023 16:53   DG Abd Portable 1V  Result Date: 03/03/2023 CLINICAL DATA:  Feeding tube placement. EXAM: PORTABLE ABDOMEN - 1 VIEW COMPARISON:  03/03/2023. FINDINGS: Feeding tube terminates in the gastric antrum. Injection of contrast is confirmatory. Volume loss in the left lung base. IMPRESSION: 1. Feeding tube terminates in the gastric antrum. 2. Left basilar volume loss. Electronically Signed   By: Leanna Battles M.D.   On: 03/03/2023 16:06   DG CHEST PORT 1 VIEW  Result Date: 03/03/2023 CLINICAL DATA:  Central line placement EXAM: PORTABLE CHEST 1 VIEW COMPARISON:  03/03/2023, 12:10 a.m.  FINDINGS: Interval placement of left neck vascular catheter,  tip over the mid SVC. Interval placement of esophagogastric tube with tip and side port below the diaphragm. Endotracheal tube remains in unchanged position. Cardiomegaly. No acute airspace opacity. IMPRESSION: 1. Interval placement of left neck vascular catheter, tip over the mid SVC. No pneumothorax. 2. Interval placement of esophagogastric tube with tip and side port below the diaphragm. 3. Endotracheal tube remains in unchanged position. 4. Cardiomegaly. No acute airspace opacity. Electronically Signed   By: Jearld Lesch M.D.   On: 03/03/2023 12:36   Overnight EEG with video  Result Date: 03/03/2023 Charlsie Quest, MD     03/04/2023  8:56 AM Patient Name: Tanner Rollins MRN: [id number] Epilepsy Attending: Charlsie Quest Referring Physician/Provider: Elmer Picker, NP Duration: 02/26/2023 1501 to 03/03/2023 1501 Patient history: 72 yo M with acute IPH and AMS. EEG to evaluate for seizure Level of alertness:  comatose AEDs during EEG study: LEV, LCM , Phenobarb, Propofol Technical aspects: This EEG study was done with scalp electrodes positioned according to the 10-20 International system of electrode placement. Electrical activity was reviewed with band pass filter of 1-70Hz , sensitivity of 7 uV/mm, display speed of 9mm/sec with a 60Hz  notched filter applied as appropriate. EEG data were recorded continuously and digitally stored.  Video monitoring was available and reviewed as appropriate. Description: At the beginning of the study, EEG showed near continuous sharply contoured 6 to 9 Hz theta-alpha activity admixed with sharp waves in left hemisphere, maximal left parietal region with waxing and waning morphology and frequency as well as 1 to 3 seconds of generalized EEG suppression.  No clinical signs were noted.  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   Antiseizure medications were adjusted.  After around midnight on 03/03/2023, EEG showed burst suppression pattern with highly epileptiform bursts in left hemisphere, maximal left parietal region lasting 2 to 12 seconds as well as asynchronous low amplitude 3 to 5 Hz theta-delta slowing admixed with 12 to 13 Hz beta activity in right hemisphere. The interburst interval was about 3 to 7 seconds.  Hyperventilation and photic stimulation were not performed.   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  Additionally there is profound diffuse encephalopathy, likely related to sedation. Dr. Pearlean Brownie was notified Charlsie Quest    DG Abd 1 View  Result Date: 03/03/2023 CLINICAL DATA:  Orogastric tube EXAM: ABDOMEN - 1 VIEW COMPARISON:  None Available. FINDINGS: Orogastric tube tip is in the gastric body. There are no dilated bowel loops. Visualized lung bases are clear. IMPRESSION: Orogastric tube tip is in the gastric body. Electronically Signed   By: Darliss Cheney M.D.   On: 03/03/2023 00:56   DG Chest Port 1 View  Result Date: 03/03/2023 CLINICAL DATA:  Endotracheal tube EXAM: PORTABLE CHEST 1 VIEW COMPARISON:  Chest x-ray 07/30/2022 FINDINGS: Endotracheal tube tip is 3.9 cm above the carina. Right-sided central venous catheter has been removed. There is mild elevation of the left hemidiaphragm. There is no focal lung infiltrate, pleural effusion or pneumothorax. Cardiomediastinal silhouette is within normal limits. There surgical clips in the left upper extremity. IMPRESSION: 1. Endotracheal tube tip is 3.9 cm above the carina. 2. No focal lung infiltrate. Electronically Signed   By: Darliss Cheney M.D.   On: 03/03/2023 00:56  CT HEAD WO  CONTRAST ( )  Result Date: 02/21/2023 CLINICAL DATA:  Hemorrhagic stroke EXAM: CT HEAD WITHOUT CONTRAST TECHNIQUE: Contiguous axial images were obtained from the base of the skull through the vertex without intravenous contrast. RADIATION DOSE REDUCTION: This exam was performed according to the departmental dose-optimization program which includes automated exposure control, adjustment of the mA and/or kV according to patient size and/or use of iterative reconstruction technique. COMPARISON:  11:54 a.m. FINDINGS: Brain: Acute intraparenchymal hematoma within the left parietal lobe is unchanged measuring 2.0 x 2.0 x 1.4 cm (volume = 3 cc). Stable mild surrounding vasogenic edema and effacement of the overlying sulci. No interval hemorrhage. Parenchymal volume loss is commensurate with the patient's age and stable since prior examination. Moderate periventricular and subcortical white matter changes are present likely reflecting the sequela of small vessel ischemia. Remote lacunar infarcts are noted within the right thalamus, right lentiform nucleus, and right corona radiata, unchanged. Stable cortical encephalomalacia within the right parietal cortex. Ventricular size is normal and stable.  Cerebellum is unremarkable. Vascular: No asymmetric hyperdense vasculature at the skull base. Moderate atherosclerotic calcification within the carotid siphons. Skull: Normal. Negative for fracture or focal lesion. Sinuses/Orbits: No acute finding. Other: Mastoid air cells and middle ear cavities are clear. IMPRESSION: 1. Stable acute intraparenchymal hematoma within the left parietal lobe measuring 2.0 x 2.0 x 1.4 cm (volume = 3 cc). No interval hemorrhage. 2. Stable senescent change 3. Stable right parietal cortical encephalomalacia. 4. Stable remote lacunar infarcts within the right thalamus, right lentiform nucleus, and right corona radiata. Electronically Signed   By: Helyn Numbers M.D.   On: 02/24/2023 21:03   EEG  adult  Result Date: 03/04/2023 Rejeana Brock, MD     03/06/2023  6:55 PM History: 72 yo M with acute IPH and AMS Sedation: none Technique: This EEG was acquired with electrodes placed according to the International 10-20 electrode system (including Fp1, Fp2, F3, F4, C3, C4, P3, P4, O1, O2, T3, T4, T5, T6, A1, A2, Fz, Cz, Pz). The following electrodes were missing or displaced: none. Background: There is a posterior dominant rhythm of 8 Hz which is seen bilaterally.  On the left, there is also focal irregular high-voltage slow activity  P7 > O1 > P3 > P7,T7.  There is also diffuse irregular slow activity intruding in the background as well.  There is some sharp activity occasionally with the same distribution. There is waxing waning of this activity, but no definite evolution. Photic stimulation: Physiologic driving is now performed EEG Abnormalities: 1) left parieto-occipital sharp waves 2) left parieto-occipital focal cerebral dysfunction 3) generalized irregular slow activity Clinical Interpretation: This EEG is consistent with an area of focal cerebral dysfunction with the potential to serve as a seizure focus in the left parieto-occipital region.  There is overriding fast activity, and though this could simply represent an area of focal cerebral dysfunction in the setting of hemorrhage, if there is clinical suspicion, would consider Ativan challenge. Ritta Slot, MD [facility] [phone] If 7pm- 7am, please page neurology on call as listed in AMION.  CT HEAD CODE STROKE WO CONTRAST  Result Date: 03/08/2023 CLINICAL DATA:  Code stroke. Neuro deficit, acute, stroke suspected. EXAM: CT HEAD WITHOUT CONTRAST TECHNIQUE: Contiguous axial images were obtained from the base of the skull through the vertex without intravenous contrast. RADIATION DOSE REDUCTION: This exam was performed according to the departmental dose-optimization program which includes automated exposure control,  adjustment of the mA and/or kV according to  patient size and/or use of iterative reconstruction technique. COMPARISON:  CT head without contrast 02/18/2023. MR head without contrast 02/18/2023. FINDINGS: Brain: Acute parenchymal hemorrhage in the left parietal lobe measures 2.2 x 1.4 x 2.0 cm. There is some surrounding vasogenic edema and involvement effacement of the sulci. No midline shift is present. Remote encephalomalacia of the right parietal lobe is again noted. 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 Multiple remote lacunar infarcts are again noted within the basal ganglia and thalami. The brainstem and cerebellum are within normal limits. Vascular: Atherosclerotic calcifications are present within the cavernous internal carotid arteries bilaterally. Calcifications are present the dural margin of both vertebral arteries. No hyperdense vessel is present. Skull: Calvarium is intact. No focal lytic or blastic lesions are present. No significant extracranial soft tissue lesion is present. Sinuses/Orbits: The paranasal sinuses and mastoid air cells are clear. The globes and orbits are within normal limits. IMPRESSION: 1. Acute parenchymal hemorrhage in the left parietal lobe measures 2.2 x 1.4 x 2.0 cm. There is some surrounding vasogenic edema and involvement of the sulci. No midline shift is present. 2. Remote encephalomalacia of the right parietal lobe is again noted. 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 3. Multiple remote lacunar infarcts within the basal ganglia and thalami. 4. Atherosclerosis. The above was relayed via text pager to Dr. Agnes Lawrence on 03/13/2023 at 12:06 . Electronically Signed   By: Marin Roberts M.D.   On: 03/16/2023 12:07   DG HIP UNILAT WITH PELVIS 2-3 VIEWS LEFT  Result Date: 02/23/2023 CLINICAL DATA:  Left hip pain EXAM: DG HIP (WITH OR WITHOUT  PELVIS) 2-3V LEFT COMPARISON:  03/11/2021 FINDINGS: There is no evidence of hip fracture or dislocation. Mild bilateral hip joint space narrowing. Unchanged coarsened trabecular markings within the sacrum and bilateral iliac bones. Both SI joints are fused. Atherosclerotic vascular calcifications. IMPRESSION: Mild bilateral hip osteoarthritis. No acute findings. Electronically Signed   By: Duanne Guess D.O.   On: 02/23/2023 11:47   ECHOCARDIOGRAM COMPLETE BUBBLE STUDY  Result Date: 02/19/2023    ECHOCARDIOGRAM REPORT   Patient Name:   Tanner Rollins Date of Exam: 02/19/2023 Medical Rec #:  [id number]       Height:       72.0 in Accession #:    [id number]      Weight:       165.0 lb Date of Birth:  November 03, 1950       BSA:          1.963 m Patient Age:    71 years        BP:           181/88 mmHg Patient Gender: M               HR:           68 bpm. Exam Location:  Inpatient Procedure: 2D Echo, Cardiac Doppler, Color Doppler and Saline Contrast Bubble            Study Indications:    stroke  History:        Patient has prior history of Echocardiogram examinations, most                 recent 01/24/2022. End stage renal disease, Arrythmias:Atrial                 Fibrillation; Risk Factors:Hypertension.  Sonographer:  Delcie Roch RDCS Referring Phys: [id number] [facility] IMPRESSIONS  1. Left ventricular ejection fraction, by estimation, is 65 to 70%. The left ventricle has normal function. The left ventricle has no regional wall motion abnormalities. There is moderate left ventricular hypertrophy. Left ventricular diastolic parameters are consistent with Grade II diastolic dysfunction (pseudonormalization). Elevated left atrial pressure.  2. Right ventricular systolic function is normal. The right ventricular size is normal. Tricuspid regurgitation signal is inadequate for assessing PA pressure.  3. Left atrial size was moderately dilated.  4. The mitral valve is normal in structure. Trivial mitral  valve regurgitation. No evidence of mitral stenosis.  5. The aortic valve is tricuspid. Aortic valve regurgitation is not visualized. No aortic stenosis is present.  6. The inferior vena cava is normal in size with <50% respiratory variability, suggesting right atrial pressure of 8 mmHg.  7. Agitated saline contrast bubble study was negative, with no evidence of any interatrial shunt. FINDINGS  Left Ventricle: Left ventricular ejection fraction, by estimation, is 65 to 70%. The left ventricle has normal function. The left ventricle has no regional wall motion abnormalities. The left ventricular internal cavity size was normal in size. There is  moderate left ventricular hypertrophy. Left ventricular diastolic parameters are consistent with Grade II diastolic dysfunction (pseudonormalization). Elevated left atrial pressure. Right Ventricle: The right ventricular size is normal. No increase in right ventricular wall thickness. Right ventricular systolic function is normal. Tricuspid regurgitation signal is inadequate for assessing PA pressure. Left Atrium: Left atrial size was moderately dilated. Right Atrium: Right atrial size was normal in size. Pericardium: There is no evidence of pericardial effusion. Mitral Valve: The mitral valve is normal in structure. Trivial mitral valve regurgitation. No evidence of mitral valve stenosis. Tricuspid Valve: The tricuspid valve is normal in structure. Tricuspid valve regurgitation is trivial. Aortic Valve: The aortic valve is tricuspid. Aortic valve regurgitation is not visualized. No aortic stenosis is present. Pulmonic Valve: The pulmonic valve was not well visualized. Pulmonic valve regurgitation is not visualized. Aorta: The aortic root is normal in size and structure. Venous: The inferior vena cava is normal in size with less than 50% respiratory variability, suggesting right atrial pressure of 8 mmHg. IAS/Shunts: The interatrial septum was not well visualized. Agitated  saline contrast was given intravenously to evaluate for intracardiac shunting. Agitated saline contrast bubble study was negative, with no evidence of any interatrial shunt.  LEFT VENTRICLE PLAX 2D LVIDd:         4.40 cm   Diastology LVIDs:         2.60 cm   LV e' medial:    6.09 cm/s LV PW:         1.50 cm   LV E/e' medial:  18.1 LV IVS:        1.30 cm   LV e' lateral:   8.16 cm/s LVOT diam:     2.20 cm   LV E/e' lateral: 13.5 LV SV:         107 LV SV Index:   55 LVOT Area:     3.80 cm  RIGHT VENTRICLE             IVC RV Basal diam:  3.00 cm     IVC diam: 1.60 cm RV S prime:     15.10 cm/s TAPSE (M-mode): 2.1 cm LEFT ATRIUM              Index        RIGHT ATRIUM  Index LA diam:        4.50 cm  2.29 cm/m   RA Area:     20.80 cm LA Vol (A2C):   101.0 ml 51.44 ml/m  RA Volume:   58.70 ml  29.90 ml/m LA Vol (A4C):   73.5 ml  37.44 ml/m LA Biplane Vol: 86.3 ml  43.96 ml/m  AORTIC VALVE LVOT Vmax:   133.00 cm/s LVOT Vmean:  89.300 cm/s LVOT VTI:    0.282 m  AORTA Ao Root diam: 3.20 cm MITRAL VALVE MV Area (PHT): 4.68 cm     SHUNTS MV Decel Time: 162 msec     Systemic VTI:  0.28 m MV E velocity: 110.00 cm/s  Systemic Diam: 2.20 cm MV A velocity: 98.10 cm/s MV E/A ratio:  1.12 Epifanio Lesches MD Electronically signed by Epifanio Lesches MD Signature Date/Time: 02/19/2023/3:02:44 PM    Final    EEG adult  Result Date: 02/19/2023 Charlsie Quest, MD     02/19/2023 10:12 AM Patient Name: Tanner Rollins MRN: [id number] Epilepsy Attending: Charlsie Quest Referring Physician/Provider: Gwenevere Abbot, MD Date: 02/18/2023 Duration: 26.03 mins Patient history: 72 y.o. male with a past medical history of PAF on eliquis, seizure history- discharged 08/03/2022 on 500mg  keppra BID, HTN, ESRD on HD (fills medications through [place] in [place]) presenting with left side weakness, left facial droop, confusion. Level of alertness: Awake, asleep AEDs during EEG study: LEV Technical aspects: This EEG study was done  with scalp electrodes positioned according to the 10-20 International system of electrode placement. Electrical activity was reviewed with band pass filter of 1-70Hz , sensitivity of 7 uV/mm, display speed of 57mm/sec with a 60Hz  notched filter applied as appropriate. EEG data were recorded continuously and digitally stored.  Video monitoring was available and reviewed as appropriate. Description: The posterior dominant rhythm consists of 8 Hz activity of moderate voltage (25-35 uV) seen predominantly in posterior head regions, symmetric and reactive to eye opening and eye closing. Sleep was characterized by vertex waves, sleep spindles (12 to 14 Hz), maximal frontocentral region. EEG showed continuous 3 to 6 Hz theta-delta slowing in right hemisphere. Physiologic photic driving was not seen during photic stimulation.  Hyperventilation was not performed.   ABNORMALITY - Continuous slow, right hemisphere IMPRESSION: This study is suggestive of cortical dysfunction arising from right hemisphere likely secondary to underlying stroke. No seizures or epileptiform discharges were seen throughout the recording. Charlsie Quest   MR BRAIN WO CONTRAST  Result Date: 02/18/2023 CLINICAL DATA:  Provided history: Seizure, new onset, no history of trauma. EXAM: MRI HEAD WITHOUT CONTRAST TECHNIQUE: Multiplanar, multiecho pulse sequences of the brain and surrounding structures were obtained without intravenous contrast. COMPARISON:  Noncontrast head CT and CT angiogram head/neck 02/18/2023. Brain MRI 07/31/2022 FINDINGS: Brain: Mild generalized parenchymal atrophy. Two acute infarcts within the right corona radiata measuring up to 13 mm. Chronic cortical/subcortical infarct (with associated chronic hemosiderin deposition) in the right parietal lobe. Chronic lacunar infarcts within the bilateral cerebral hemispheric white matter, deep gray nuclei and within the pons. Chronic hemosiderin deposition associated with a chronic  lacunar infarct in the right basal ganglia. Background moderate multifocal T2 FLAIR hyperintense signal abnormality within the cerebral white matter, nonspecific but compatible with chronic small vessel ischemic disease. Multiple small chronic infarcts within the left cerebellar hemisphere. Chronic microhemorrhages scattered within the bilateral cerebral hemispheres, thalami, brainstem and cerebellum. No evidence of an intracranial mass No extra-axial fluid collection. No midline shift. Vascular: Maintained flow voids within the  proximal large arterial vessels. Skull and upper cervical spine: No suspicious marrow lesion. Incompletely assessed cervical spondylosis. Sinuses/Orbits: No mass or acute finding within the imaged orbits. Mild mucosal thickening versus small mucous retention cyst within the inferior left maxillary sinus. Tiny mucous retention cysts within the bilateral sphenoid sinuses. IMPRESSION: 1. Two acute infarcts within the right corona radiata measuring up to 13 mm. 2. Background parenchymal atrophy, chronic small vessel ischemic disease and chronic infarcts, as detailed and stable from the prior brain MRI of 07/31/2022. 3. As before, there are scattered chronic microhemorrhages within the supratentorial and infratentorial brain. Findings likely at least partly reflect sequelae of chronic hypertensive microangiopathy. However, a superimposed component of cerebral amyloid angiopathy cannot be excluded. Electronically Signed   By: Jackey Loge D.O.   On: 02/18/2023 11:56   CT ANGIO HEAD NECK W WO CM (CODE STROKE)  Result Date: 02/18/2023 CLINICAL DATA:  Assess intracranial arteries. EXAM: CT ANGIOGRAPHY HEAD AND NECK WITH AND WITHOUT CONTRAST TECHNIQUE: Multidetector CT imaging of the head and neck was performed using the standard protocol during bolus administration of intravenous contrast. Multiplanar CT image reconstructions and MIPs were obtained to evaluate the vascular anatomy. Carotid  stenosis measurements (when applicable) are obtained utilizing NASCET criteria, using the distal internal carotid diameter as the denominator. RADIATION DOSE REDUCTION: This exam was performed according to the departmental dose-optimization program which includes automated exposure control, adjustment of the mA and/or kV according to patient size and/or use of iterative reconstruction technique. CONTRAST:  75mL OMNIPAQUE IOHEXOL 350 MG/ML SOLN COMPARISON:  Same day CT head.  CTA head/neck 07/30/2022. FINDINGS: CTA NECK FINDINGS Aortic arch: Great vessel origins are patent without significant stenosis. Aortic atherosclerosis. Right carotid system: Atherosclerosis at the carotid bifurcation and involving the proximal ICA without greater than 50% stenosis. Left carotid system: Atherosclerosis at the carotid bifurcation without greater than 50% stenosis. Vertebral arteries: Potentially significant stenosis of the right vertebral artery origins; however, evaluation is significantly limited due to streak artifact. Moderate stenosis of the proximal left V2 vertebral artery. Skeleton: No evidence of acute abnormality on limited assessment. Other neck: No acute abnormality on limited assessment. Upper chest: Visualized lung apices are clear. Review of the MIP images confirms the above findings CTA HEAD FINDINGS Anterior circulation: Bilateral intracranial ICAs, MCAs and ACAs are patent Posterior circulation: Severe stenosis of the intradural right vertebral artery. This vertebral artery is non dominant/small and terminates as PICA, anatomic variant. The left intradural vertebral artery, basilar artery and bilateral posterior cerebral arteries are patent. Left fetal type PCA. Venous sinuses: As permitted by contrast timing, patent. Anatomic variants: Detailed above. Review of the MIP images confirms the above findings IMPRESSION: 1. No emergent large vessel occlusion. 2. 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 3. Moderate proximal left V2 vertebral artery stenosis. 4. Potentially significant stenosis of the right vertebral artery origins; however, evaluation is significantly limited due to streak artifact. 5. Aortic Atherosclerosis (ICD10-I70.0) and Emphysema (ICD10-J43.9). Electronically Signed   By: Feliberto Harts M.D.   On: 02/18/2023 09:40   CT HEAD CODE STROKE WO CONTRAST  Result Date: 02/18/2023 CLINICAL DATA:  Code stroke.  Neuro deficit, acute, stroke suspected EXAM: CT HEAD WITHOUT CONTRAST TECHNIQUE: Contiguous axial images were obtained from the base of the skull through the vertex without intravenous contrast. RADIATION DOSE REDUCTION: This exam was performed according to the departmental dose-optimization program which includes automated exposure control, adjustment of the mA and/or kV according to patient size  and/or use of iterative reconstruction technique. COMPARISON:  CT head 07/30/2022. FINDINGS: Brain: Similar appearance of remote infarcts in the right parietal lobe, basal ganglia and right frontal white matter. No evidence of acute large vascular territory infarct, acute hemorrhage, mass lesion, hydrocephalus, or midline shift. Vascular: No hyperdense vessel identified. Skull: No acute fracture. Sinuses/Orbits: Mostly clear sinuses. Left maxillary sinus retention cyst. Other: No mastoid effusions. ASPECTS [facility] Stroke Program Early CT Score) total score (0-10 with 10 being normal): 10. IMPRESSION: 1. No evidence of acute intracranial abnormality. ASPECTS is 10. 2. Multiple remote infarcts. An MRI could provide more sensitive evaluation for acute on chronic peri-infarct ischemia if clinically warranted. Code stroke imaging results were communicated on 02/18/2023 at 9:19 am to provider Dr. Selina Cooley Via secure text paging. Electronically Signed   By: Feliberto Harts M.D.   On: 02/18/2023 09:20    Microbiology No results found for this or any previous visit (from the  past 240 hour(s)).  Lab Basic Metabolic Panel: Recent Labs  Lab March 09, 2023 0929 03/09/23 1154  NA 129* 131*  K 6.0* 7.2*  CL  --  90*  CO2  --  18*  GLUCOSE  --  112*  BUN  --  70*  CREATININE  --  7.52*  CALCIUM  --  9.2  MG  --  2.5*   Liver Function Tests: No results for input(s): "AST", "ALT", "ALKPHOS", "BILITOT", "PROT", "ALBUMIN" in the last 168 hours. No results for input(s): "LIPASE", "AMYLASE" in the last 168 hours. No results for input(s): "AMMONIA" in the last 168 hours. CBC: Recent Labs  Lab Mar 09, 2023 0929 03-09-2023 1154  WBC  --  4.2  HGB 11.2* 10.7*  HCT 33.0* 32.6*  MCV  --  94.2  PLT  --  139*   Cardiac Enzymes: No results for input(s): "CKTOTAL", "CKMB", "CKMBINDEX", "TROPONINI" in the last 168 hours. Sepsis Labs: Recent Labs  Lab 03-09-23 1154  WBC 4.2    Lorin Glass 03/13/2023, 7:08 PM

## 2023-03-20 NOTE — Progress Notes (Addendum)
NAME:  Tanner Rollins, MRN:  [id number], DOB:  1950-10-04, LOS: 5 ADMISSION DATE:  02/22/2023, CONSULTATION DATE:  03/16/2023 REFERRING MD: Dr. Wilford Corner, CHIEF COMPLAINT: Seizures, encephalopathy  History of Present Illness:  72 year old man with hypertension and end-stage renal disease on HD, seizure disorder, atrial fibrillation on Eliquis with medical noncompliance.  He was admitted 7/2-7/5 with right corona radiata CVAs.  He completed rehab and then was discharged home on 9/14 on Keppra, Eliquis.  After leaving the hospital he was noted to slump over in the car and was brought back for emergent evaluation.  Head CT showed an acute parenchymal hemorrhage and left parietal lobe 2.2 x 1.4 x 2.0 cm with some surrounding vasogenic edema.  He was admitted to 4[facility]., course complicated by left-sided seizure activity.  He was loaded and continued on valproate, continued Keppra.  Subsequently continuous EEG continues to show seizures and he received Vimpat.  There was some slowing of epileptogenic discharges but seizures continued.  Some associated encephalopathy, suppressed mental status, hypotension and bradycardia.  PCCM consulted for airway protection so he can undergo burst suppression.  Pertinent  Medical History   Past Medical History:  Diagnosis Date   ESRD on hemodialysis (HCC)    [facility]   Hypertension    PAF (paroxysmal atrial fibrillation) (HCC)    Seizure disorder (HCC)     Significant Hospital Events: Including procedures, antibiotic start and stop dates in addition to other pertinent events   7/14 Head CT > acute parenchymal hemorrhage left parietal lobe 2.2 x 1.4 x 2.0 cm with some surrounding vasogenic edema and effacement of the sulci.  No midline shift present.  Multiple remote lacunar infarcts noted in the basal ganglia and thalami 7/15 Intubated overnight for airway protection, remains in status this am despite propofol, keppra, vimpat, and valproate. Adding versed drip   7/16 on Ketamine and Versed infusion for goal Burst suppression over next 24 hours 7/17 wean sedation over next 24-48 hours, continues on Long term EEG 7/18- sedation off completely- follow neuro exam 03-09-23 worsening oxygenation with increasing vent setting   Interim History / Subjective:  Now on 100% FIO2 and 12 of PEEP   Objective   Blood pressure 120/72, pulse 97, temperature (!) 100.9 F (38.3 C), resp. rate (!) 29, height 6' (1.829 m), weight 74.7 kg, SpO2 92%.    Vent Mode: PRVC FiO2 (%):  [60 %-100 %] 100 % Set Rate:  [15 bmp] 15 bmp Vt Set:  [580 mL] 580 mL PEEP:  [5 cmH20-12 cmH20] 12 cmH20 Plateau Pressure:  [16 cmH20-21 cmH20] 21 cmH20   Intake/Output Summary (Last 24 hours) at 2023/03/09 0955 Last data filed at 03/09/2023 0700 Gross per 24 hour  Intake 1776.01 ml  Output 0 ml  Net 1776.01 ml   Filed Weights   03/05/23 0500 03/06/23 0500  Weight: 71.1 kg 74.7 kg    Examination: General: Acute on chronically ill appearing elderly  male lying in bed on mechanical ventilation, in NAD HEENT: ETT, MM pink/moist, PERRL,  Neuro: Unresponsive on vent  CV: s1s2 regular rate and rhythm, no murmur, rubs, or gallops,  PULM:  Slightly diminished bilaterally, mild increase work of breathing, no added breath sounds, tachypnea, 100% FIO2 and 12 PEEP  GI: soft, bowel sounds active in all 4 quadrants, non-tender, non-distended, tolerating TF Extremities: warm/dry, no edema  Skin: no rashes or lesions   Resolved Hospital Problem list     Assessment & Plan:   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 Amyloid vasculopathy Right corona radiata CVAs -7/17 MRI showing similar size of L parietal IPH with mild surrounding edema associated with mass effect however no midline shift.  Multifactorial encephalopathy -Due to the above as well as sedating meds / AED. All sedation wean off by am  03-10-23 with no improvement in mentation patient remains unresponsive. Due to oxygenation issues overnight will need to resume propofol am 10-Mar-2023 P: Management per neurology  Maintain neuro protective measures  Nutrition and bowel regiment  Seizure precautions  AEDs per neurology  Aspirations precautions  LTM per neuro   Acute respiratory failure with impaired airway protection in the setting of Status,  now evolving worsening oxygenation with ARDS secondary to PNA vs pulmonary edam  -AM 03-10-23 P/F ratio 74 meeting criteria for severe ARDS, currently requiring 100% FIOS2 and 12 PEEP  P: Check CXR Obtain sputum culture  Start Zosyn unable to due Cefepime or Meropenem given seizure history and AED contraindications   Continue ventilator support with lung protective strategies  Wean PEEP and FiO2 for sats greater than 90%. Head of bed elevated 30 degrees. Plateau pressures less than 30 cm H20.  Follow intermittent chest x-ray and ABG.   SAT/SBT as tolerated, mentation preclude extubation  Ensure adequate pulmonary hygiene  VAP bundle in place  PAD protocol Continue volume removal per iHD   ESRD P: Nephrology following, appreciate assistance  Follow renal function  Trend Bmet Avoid nephrotoxins Ensure adequate renal perfusion   Atrial fibrillation, currently bradycardic on monitor Hx HTN P: Anticoagulation reminds on hold Continuous telemetry   At risk malnutrition P: Continue TF Protein supplementation  GOC  PMT consulted and following to assist in goals of care discussions. At this time plan remains to continue aggressive intervention and monitor closely   Best Practice (right click and "Reselect all SmartList Selections" daily)   Diet/type: NPO DVT prophylaxis: SCD GI prophylaxis: PPI Lines: N/A Foley:  Yes, and it is still needed Code Status:  full code Last date of multidisciplinary goals of care discussion [pending]  Critical care time:  CRITICAL CARE Performed  by: Vanshika Jastrzebski D. Harris  Total critical care time: 40 minutes  Critical care time was exclusive of separately billable procedures and treating other patients.  Critical care was necessary to treat or prevent imminent or life-threatening deterioration.  Critical care was time spent personally by me on the following activities: development of treatment plan with patient and/or surrogate as well as nursing, discussions with consultants, evaluation of patient's response to treatment, examination of patient, obtaining history from patient or surrogate, ordering and performing treatments and interventions, ordering and review of laboratory studies, ordering and review of radiographic studies, pulse oximetry and re-evaluation of patient's condition.  Charliee Krenz D. Harris, NP-C [facility] Personal contact information can be found on Amion  If no contact or response made please call [phone] 03-10-2023, 9:55 AM

## 2023-03-20 DEATH — deceased
